# Patient Record
Sex: Female | Born: 1940 | Race: White | Hispanic: No | Marital: Married | Smoking: Never smoker
Health system: Southern US, Community
[De-identification: ages and names within clinical notes are randomized; demographics above are authoritative.]

## PROBLEM LIST (undated history)

## (undated) DIAGNOSIS — I5032 Chronic diastolic (congestive) heart failure: Secondary | ICD-10-CM

## (undated) DIAGNOSIS — I639 Cerebral infarction, unspecified: Secondary | ICD-10-CM

## (undated) DIAGNOSIS — N184 Chronic kidney disease, stage 4 (severe): Secondary | ICD-10-CM

## (undated) DIAGNOSIS — I2510 Atherosclerotic heart disease of native coronary artery without angina pectoris: Secondary | ICD-10-CM

## (undated) DIAGNOSIS — Z87442 Personal history of urinary calculi: Secondary | ICD-10-CM

## (undated) DIAGNOSIS — M1990 Unspecified osteoarthritis, unspecified site: Secondary | ICD-10-CM

## (undated) DIAGNOSIS — I071 Rheumatic tricuspid insufficiency: Secondary | ICD-10-CM

## (undated) DIAGNOSIS — I052 Rheumatic mitral stenosis with insufficiency: Secondary | ICD-10-CM

## (undated) DIAGNOSIS — E039 Hypothyroidism, unspecified: Secondary | ICD-10-CM

## (undated) DIAGNOSIS — I480 Paroxysmal atrial fibrillation: Secondary | ICD-10-CM

## (undated) DIAGNOSIS — I10 Essential (primary) hypertension: Secondary | ICD-10-CM

## (undated) HISTORY — PX: TONSILLECTOMY: SUR1361

## (undated) HISTORY — DX: Rheumatic mitral stenosis with insufficiency: I05.2

## (undated) HISTORY — PX: ABDOMINAL HYSTERECTOMY: SHX81

## (undated) HISTORY — DX: Atherosclerotic heart disease of native coronary artery without angina pectoris: I25.10

## (undated) HISTORY — PX: CARDIAC CATHETERIZATION: SHX172

## (undated) HISTORY — DX: Essential (primary) hypertension: I10

## (undated) HISTORY — PX: PARTIAL HYSTERECTOMY: SHX80

## (undated) HISTORY — DX: Rheumatic tricuspid insufficiency: I07.1

---

## 2004-10-05 ENCOUNTER — Ambulatory Visit: Payer: Self-pay | Admitting: Family Medicine

## 2005-01-13 ENCOUNTER — Ambulatory Visit: Payer: Self-pay | Admitting: Internal Medicine

## 2006-05-24 ENCOUNTER — Ambulatory Visit: Payer: Self-pay | Admitting: Surgery

## 2006-11-08 ENCOUNTER — Ambulatory Visit: Payer: Self-pay | Admitting: Gastroenterology

## 2008-12-26 HISTORY — PX: COLONOSCOPY: SHX174

## 2014-02-17 ENCOUNTER — Encounter: Payer: Self-pay | Admitting: Cardiovascular Disease

## 2014-02-17 ENCOUNTER — Encounter (INDEPENDENT_AMBULATORY_CARE_PROVIDER_SITE_OTHER): Payer: Self-pay

## 2014-02-17 ENCOUNTER — Ambulatory Visit (INDEPENDENT_AMBULATORY_CARE_PROVIDER_SITE_OTHER): Payer: Medicare Other | Admitting: Cardiovascular Disease

## 2014-02-17 VITALS — BP 170/70 | HR 75 | Ht 62.0 in | Wt 164.8 lb

## 2014-02-17 DIAGNOSIS — I6529 Occlusion and stenosis of unspecified carotid artery: Secondary | ICD-10-CM

## 2014-02-17 DIAGNOSIS — I10 Essential (primary) hypertension: Secondary | ICD-10-CM

## 2014-02-17 DIAGNOSIS — I2510 Atherosclerotic heart disease of native coronary artery without angina pectoris: Secondary | ICD-10-CM

## 2014-02-17 DIAGNOSIS — E782 Mixed hyperlipidemia: Secondary | ICD-10-CM | POA: Insufficient documentation

## 2014-02-17 DIAGNOSIS — R931 Abnormal findings on diagnostic imaging of heart and coronary circulation: Secondary | ICD-10-CM | POA: Insufficient documentation

## 2014-02-17 DIAGNOSIS — E785 Hyperlipidemia, unspecified: Secondary | ICD-10-CM

## 2014-02-17 DIAGNOSIS — R9389 Abnormal findings on diagnostic imaging of other specified body structures: Secondary | ICD-10-CM

## 2014-02-17 NOTE — Assessment & Plan Note (Signed)
20-30% disease of the mid left circumflex by catheterization in September 2005. Recommended aggressive cholesterol management

## 2014-02-17 NOTE — Assessment & Plan Note (Signed)
Study suggests normal LV function, mild TR, mild to moderate PR I'm not particularly concerned with the mild to moderate PR if this is a true finding. She is relatively asymptomatic. No changes to her medications. No repeat echocardiogram is typically needed

## 2014-02-17 NOTE — Assessment & Plan Note (Signed)
Studies since 2005 have documented moderate left carotid disease that has been relatively stable. Most recent CT scan suggesting 50-60%  On the left. This can be watched with periodic ultrasounds. Recommended aggressive cholesterol management with goal LDL less than 70

## 2014-02-17 NOTE — Assessment & Plan Note (Signed)
Given her carotid arterial disease, need to treat her cholesterol aggressively. Goal LDL less than 70

## 2014-02-17 NOTE — Assessment & Plan Note (Signed)
Blood pressure is very elevated today. Uncertain if this is secondary to being in our office. She does report much improved blood pressures at home typically in the 120-130 range. We have suggested she closely monitor her blood pressures at home, write these down and call our office with numbers in the next week or so. If they continue to run high, they would need to be aggressively treated.

## 2014-02-17 NOTE — Patient Instructions (Signed)
Please closely monitor your blood pressure Call the office with numbers in the next week or so  We will review your labs when available as well as other records  Call if you would like an ultrasound of you carotid next year Consider requesting a CD of the neck CT scan  Please call us if you have new issues that need to be addressed before your next appt.  Your physician wants you to follow-up in: 12 months.  You will receive a reminder letter in the mail two months in advance. If you don't receive a letter, please call our office to schedule the follow-up appointment.

## 2014-02-17 NOTE — Progress Notes (Signed)
Patient ID: Caroline [name], female    DOB: 1941/12/09, 73 y.o.   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  She reports that she recently had a carotid CT scan done.  This was done in followup for bruit and known stenosis.  He reports was done locally but referred to [facility] for the reading. The details bilateral carotid calcification, 50-60% stenosis of the proximal left internal carotid. There is focal high-grade stenosis of the left external carotid.  no significant stenoses on the right   Results seem to be similar to prior CT scans dating back to 2005 . There was a discrepancy with MRI in 2007 that did not show any stenosis There is a "kink " possibly bilaterally which has been a chronic finding   Prior echocardiogram January 2014 showed normal LV function with ejection fraction 69%, mild TR The report does mention at least mild to moderate regurg, mild TR  Cardiac catheterization September 2005 showing 20-30% mid left circumflex disease, ejection fraction 60%, otherwise normal study  In general she feels well with no complaints. She denies any significant chest pain, shortness of breath. She is somewhat concerned about her carotid artery and would like a second opinion.  EKG shows normal sinus rhythm with rate 75 beats per minute, no significant ST or T wave changes     Outpatient Encounter Prescriptions as of 02/17/2014  Medication Sig  . amLODipine (NORVASC) 10 MG tablet Take 10 mg by mouth daily.  . cholecalciferol (VITAMIN D) 1000 UNITS tablet Take 1,000 Units by mouth daily.  Marland Kitchen levothyroxine (SYNTHROID, LEVOTHROID) 100 MCG tablet Take 100 mcg by mouth daily before breakfast.  .  lisinopril-hydrochlorothiazide (PRINZIDE,ZESTORETIC) 20-25 MG per tablet Take 1 tablet by mouth daily.  . Multiple Vitamin (MULTIVITAMIN) tablet Take 1 tablet by mouth daily.  . rosuvastatin (CRESTOR) 10 MG tablet Take 10 mg by mouth daily.    Review of Systems  Constitutional: Negative.   HENT: Negative.   Eyes: Negative.   Respiratory: Negative.   Cardiovascular: Negative.   Gastrointestinal: Negative.   Endocrine: Negative.   Musculoskeletal: Negative.   Skin: Negative.   Allergic/Immunologic: Negative.   Neurological: Negative.   Hematological: Negative.   Psychiatric/Behavioral: Negative.   All other systems reviewed and are negative.    BP 170/70  Pulse 75  Ht 5\' 2"  (1.575 m)  Wt 164 lb 12 oz (74.73 kg)  BMI 30.13 kg/m2 Repeat blood pressure by myself showed systolic pressure [id number] Physical Exam  Nursing note and vitals reviewed. Constitutional: She is oriented to person, place, and time. She appears well-developed and well-nourished.  HENT:  Head: Normocephalic.  Nose: Nose normal.  Mouth/Throat: Oropharynx is clear and moist.  Eyes: Conjunctivae are normal. Pupils are equal, round, and reactive to light.  Neck: Normal range of motion. Neck supple. No JVD present.  Cardiovascular: Normal rate, regular rhythm, S1 normal, S2 normal, normal heart sounds and intact distal pulses.  Exam reveals no gallop and no friction rub.   No murmur heard. Pulmonary/Chest: Effort normal and breath sounds normal. No respiratory distress. She has no wheezes. She has no rales. She exhibits no tenderness.  Abdominal: Soft. Bowel sounds are normal. She exhibits no distension. There is no tenderness.  Musculoskeletal: Normal range of motion. She exhibits no edema and no tenderness.  Lymphadenopathy:    She has no cervical adenopathy.  Neurological: She is alert and oriented to person, place, and time. Coordination normal.  Skin: Skin is warm and dry. No rash noted. No erythema.   Psychiatric: She has a normal mood and affect. Her behavior is normal. Judgment and thought content normal.    Assessment and Plan

## 2014-09-09 ENCOUNTER — Ambulatory Visit: Payer: Self-pay

## 2015-03-25 ENCOUNTER — Telehealth: Payer: Self-pay | Admitting: *Deleted

## 2015-03-25 NOTE — Telephone Encounter (Signed)
Pt is overdue for her 1 year appt.  Can we see if she wants to come in to discuss?

## 2015-03-25 NOTE — Telephone Encounter (Signed)
Pt is asking if we can recommend a vascular surgeon  recently had a sonogram  65 percent blockage and now needs to see a vascular surgeon.  Dr dew is who she is about to see but if we know anyone else then please advise patient.

## 2015-03-26 NOTE — Telephone Encounter (Signed)
Pt is coming 04/16/15: at 3:15pm

## 2015-04-14 ENCOUNTER — Telehealth: Payer: Self-pay | Admitting: *Deleted

## 2015-04-14 NOTE — Telephone Encounter (Signed)
Pt states have a blockage 75% in Carotid arteries, would like [place] to refer her to a good vascular surgeon.  Please call patient.  Would prefer to go to [facility]

## 2015-04-16 ENCOUNTER — Ambulatory Visit: Payer: Self-pay | Admitting: Cardiovascular Disease

## 2015-04-20 ENCOUNTER — Ambulatory Visit
Admit: 2015-04-20 | Disposition: A | Discharge status: Home / Self Care | Payer: Self-pay | Attending: Vascular Surgery | Admitting: Vascular Surgery

## 2015-05-08 ENCOUNTER — Ambulatory Visit: Payer: Self-pay | Admitting: Cardiovascular Disease

## 2016-09-07 DIAGNOSIS — M25569 Pain in unspecified knee: Secondary | ICD-10-CM | POA: Insufficient documentation

## 2016-09-25 DIAGNOSIS — E039 Hypothyroidism, unspecified: Secondary | ICD-10-CM | POA: Insufficient documentation

## 2016-09-25 DIAGNOSIS — I10 Essential (primary) hypertension: Secondary | ICD-10-CM | POA: Insufficient documentation

## 2016-10-18 ENCOUNTER — Ambulatory Visit (INDEPENDENT_AMBULATORY_CARE_PROVIDER_SITE_OTHER): Payer: Medicare Other | Admitting: Vascular Surgery

## 2016-10-18 ENCOUNTER — Encounter (INDEPENDENT_AMBULATORY_CARE_PROVIDER_SITE_OTHER): Payer: Self-pay | Admitting: Vascular Surgery

## 2016-10-18 VITALS — BP 184/78 | HR 84 | Resp 16 | Ht 62.0 in | Wt 165.0 lb

## 2016-10-18 DIAGNOSIS — I2510 Atherosclerotic heart disease of native coronary artery without angina pectoris: Secondary | ICD-10-CM | POA: Diagnosis not present

## 2016-10-18 DIAGNOSIS — I6523 Occlusion and stenosis of bilateral carotid arteries: Secondary | ICD-10-CM

## 2016-10-18 DIAGNOSIS — E785 Hyperlipidemia, unspecified: Secondary | ICD-10-CM

## 2016-10-18 DIAGNOSIS — G451 Carotid artery syndrome (hemispheric): Secondary | ICD-10-CM

## 2016-10-18 DIAGNOSIS — I10 Essential (primary) hypertension: Secondary | ICD-10-CM | POA: Diagnosis not present

## 2016-10-18 DIAGNOSIS — G459 Transient cerebral ischemic attack, unspecified: Secondary | ICD-10-CM | POA: Insufficient documentation

## 2016-10-18 MED ORDER — ASPIRIN EC 81 MG PO TBEC: 81.0000 mg | DELAYED_RELEASE_TABLET | Freq: Every day | ORAL | 2 refills | Status: DC

## 2016-10-18 NOTE — Assessment & Plan Note (Signed)
Her carotid disease has not been checked in about a year. At last check, she had moderate disease by duplex but it did not appear as severe on CT angiogram. Have recommended she begin aspirin therapy today. We will get a carotid duplex in the near future at her convenience. She will return to discuss the results and determine further treatment options.

## 2016-10-18 NOTE — Assessment & Plan Note (Signed)
The patient describes symptoms sound clearly like a transient ischemic attack. She was seen at [facility] in her head CT was normal. We are going to perform a carotid evaluation. She will start aspirin. Return to clinic after her duplex.

## 2016-10-18 NOTE — Assessment & Plan Note (Signed)
Follows with cardiology for this

## 2016-10-18 NOTE — Assessment & Plan Note (Signed)
blood pressure control important in reducing the progression of atherosclerotic disease. On appropriate oral medications.  

## 2016-10-18 NOTE — Assessment & Plan Note (Signed)
No longer on Crestor. Reassess when she returns with her carotid duplex

## 2016-10-18 NOTE — Progress Notes (Signed)
MRN : [id number]  Caroline [name] is a 75 y.o. (September 03, 1941) female who presents with chief complaint of  Chief Complaint  Patient presents with  . Carotid    Would like carotid checked, because of a recent TIA in September  .  History of Present Illness: Patient presents for evaluation for carotid artery stenosis. She had been seen but been well over a year since her last visit. At last check, she was found to have mild to moderate carotid artery stenosis that did not require any intervention. Last month, she had an episode of what sounds like a clear TIA. She had about 15 minutes of visual loss in her left eye. She also had associated aphasia and weakness. This resolved in less than a half hour and she was seen and had a normal head CT. She has not had her carotids checked in a year or so. She had not had any previous episodes such as this. She was no longer taking aspirin. She denies fevers or chills. She denies chest pain or shortness of breath. When this happened, she was riding in the car with no clear inciting event or causative factor. It got better without any intervention.  Current Outpatient Prescriptions  Medication Sig Dispense Refill  . allopurinol (ZYLOPRIM) 100 MG tablet     . amLODipine (NORVASC) 10 MG tablet Take 10 mg by mouth daily.    . cholecalciferol (VITAMIN D) 1000 UNITS tablet Take 1,000 Units by mouth daily.    . furosemide (LASIX) 40 MG tablet     . levothyroxine (SYNTHROID, LEVOTHROID) 100 MCG tablet Take 100 mcg by mouth daily before breakfast.    . lisinopril-hydrochlorothiazide (PRINZIDE,ZESTORETIC) 20-25 MG per tablet Take 1 tablet by mouth daily.    . Multiple Vitamin (MULTIVITAMIN) tablet Take 1 tablet by mouth daily.     No current facility-administered medications for this visit.     Past Medical History:  Diagnosis Date  . Hypertension   . Thyroid disease     Past Surgical History:  Procedure Laterality Date  . CARDIAC CATHETERIZATION     Negative   . PARTIAL HYSTERECTOMY      Social History Social History  Substance Use Topics  . Smoking status: Never Smoker  . Smokeless tobacco: Not on file  . Alcohol use 0.6 oz/week    1 Glasses of wine per week     Comment: social   Married, lives with husband  Family History Family History  Problem Relation Age of Onset  . Heart attack Father 66  . Hypertension Father   . Hyperlipidemia Father   . Heart disease Brother     valvular disease   No bleeding or clotting disorders  No Known Allergies   REVIEW OF SYSTEMS (Negative unless checked)  Constitutional: [] Weight loss  [] Fever  [] Chills Cardiac: [] Chest pain   [] Chest pressure   [x] Palpitations   [] Shortness of breath when laying flat   [] Shortness of breath at rest   [] Shortness of breath with exertion. Vascular:  [] Pain in legs with walking   [] Pain in legs at rest   [] Pain in legs when laying flat   [] Claudication   [] Pain in feet when walking  [] Pain in feet at rest  [] Pain in feet when laying flat   [] History of DVT   [] Phlebitis   [] Swelling in legs   [] Varicose veins   [] Non-healing ulcers Pulmonary:   [] Uses home oxygen   [] Productive cough   [] Hemoptysis   [] Wheeze  []   COPD   [] Asthma Neurologic:  [] Dizziness  [] Blackouts   [] Seizures   [] History of stroke   [x] History of TIA  [] Aphasia   [] Temporary blindness   [] Dysphagia   [] Weakness or numbness in arms   [] Weakness or numbness in legs Musculoskeletal:  [] Arthritis   [] Joint swelling   [] Joint pain   [] Low back pain Hematologic:  [] Easy bruising  [] Easy bleeding   [] Hypercoagulable state   [] Anemic  [] Hepatitis Gastrointestinal:  [] Blood in stool   [] Vomiting blood  [] Gastroesophageal reflux/heartburn   [] Difficulty swallowing. Genitourinary:  [] Chronic kidney disease   [] Difficult urination  [] Frequent urination  [] Burning with urination   [] Blood in urine Skin:  [] Rashes   [] Ulcers   [] Wounds Psychological:  [] History of anxiety   []  History of major  depression.  Physical Examination  Vitals:   10/18/16 1557  BP: (!) 184/78  Pulse: 84  Resp: 16  Weight: 165 lb (74.8 kg)  Height: 5\' 2"  (1.575 m)   Body mass index is 30.18 kg/m. Gen:  WD/WN, NAD. Appears younger than stated age Head: [place]/AT, No temporalis wasting. Ear/Nose/Throat: Hearing grossly intact, nares w/o erythema or drainage, trachea midline Eyes: Conjunctiva clear. Sclera non-icteric Neck: Supple.  No JVD. Bilateral carotid bruits present Pulmonary:  Good air movement, equal and clear to auscultation bilaterally.  Cardiac: RRR, normal S1, S2, no Murmurs, rubs or gallops. Vascular:  Vessel Right Left  Radial Palpable Palpable  Ulnar Palpable Palpable  Brachial Palpable Palpable  Carotid Palpable, with bruit Palpable, with bruit  Aorta Not palpable N/A  Femoral Palpable Palpable  Popliteal Palpable Palpable  PT Palpable Palpable  DP Palpable Not Palpable   Gastrointestinal: soft, non-tender/non-distended. No guarding/reflex.  Musculoskeletal: M/S 5/5 throughout.  No deformity or atrophy. No lower extremity edema. Neurologic: CN 2-12 intact. Sensation grossly intact in extremities.  Symmetrical.  Speech is fluent. Motor exam as listed above. Psychiatric: Judgment intact, Mood & affect appropriate for pt's clinical situation. Dermatologic: No rashes or ulcers noted.  No cellulitis or open wounds. Lymph : No Cervical, Axillary, or Inguinal lymphadenopathy.     CBC No results found for: WBC, HGB, HCT, MCV, PLT  BMET No results found for: NA, K, CL, CO2, GLUCOSE, BUN, CREATININE, CALCIUM, GFRNONAA, GFRAA CrCl cannot be calculated (No order found.).  COAG No results found for: INR, PROTIME  Radiology No results found.   Assessment/Plan Hyperlipidemia No longer on Crestor. Reassess when she returns with her carotid duplex  HTN (hypertension) blood pressure control important in reducing the progression of atherosclerotic disease. On appropriate oral  medications.   Coronary disease Follows with cardiology for this  Carotid stenosis Her carotid disease has not been checked in about a year. At last check, she had moderate disease by duplex but it did not appear as severe on CT angiogram. Have recommended she begin aspirin therapy today. We will get a carotid duplex in the near future at her convenience. She will return to discuss the results and determine further treatment options.  TIA (transient ischemic attack) The patient describes symptoms sound clearly like a transient ischemic attack. She was seen at [facility] in her head CT was normal. We are going to perform a carotid evaluation. She will start aspirin. Return to clinic after her duplex.    Leotis Pain, MD  10/18/2016 4:30 PM    This note was created with Dragon medical transcription system.  Any errors from dictation are purely unintentional

## 2016-10-18 NOTE — Patient Instructions (Signed)
°Carotid Artery Disease °The carotid arteries are the two main arteries on either side of the neck that supply blood to the brain. Carotid artery disease, also called carotid artery stenosis, is the narrowing or blockage of one or both carotid arteries. Carotid artery disease increases your risk for a stroke or a transient ischemic attack (TIA). A TIA is an episode in which a waxy, fatty substance that accumulates within the artery (plaque) blocks blood flow to the brain. A TIA is considered a "warning stroke."  °CAUSES  °· Buildup of plaque inside the carotid arteries (atherosclerosis) (common). °· A weakened outpouching in an artery (aneurysm). °· Inflammation of the carotid artery (arteritis). °· A fibrous growth within the carotid artery (fibromuscular dysplasia). °· Tissue death within the carotid artery due to radiation treatment (post-radiation necrosis). °· Decreased blood flow due to spasms of the carotid artery (vasospasm). °· Separation of the walls of the carotid artery (carotid dissection). °RISK FACTORS °· High cholesterol (dyslipidemia).   °· High blood pressure (hypertension).   °· Smoking.   °· Obesity.   °· Diabetes.   °· Family history of cardiovascular disease.   °· Inactivity or lack of regular exercise.   °· Being female. Men have an increased risk of developing atherosclerosis earlier in life than women.   °SYMPTOMS  °Carotid artery disease does not cause symptoms. °DIAGNOSIS °Diagnosis of carotid artery disease may include:  °· A physical exam. Your health care provider may hear an abnormal sound (bruit) when listening to the carotid arteries.   °· Specific tests that look at the blood flow in the carotid arteries. These tests include:   °¨ Carotid artery ultrasonography.   °¨ Carotid or cerebral angiography.   °¨ Computerized tomographic angiography (CTA).   °¨ Magnetic resonance angiography (MRA).   °TREATMENT  °Treatment of carotid artery disease can include a combination of treatments.  Treatment options include: °· Surgery. You may have:   °¨ A carotid endarterectomy. This is a surgery to remove the blockages in the carotid arteries.   °¨ A carotid angioplasty with stenting. This is a nonsurgical interventional procedure. A wire mesh (stent) is used to widen the blocked carotid arteries.   °· Medicines to control blood pressure, cholesterol, and reduce blood clotting (antiplatelet therapy).   °· Adjusting your diet.   °· Lifestyle changes such as:   °¨ Quitting smoking.   °¨ Exercising as tolerated or as directed by your health care provider.   °¨ Controlling and maintaining a good blood pressure.   °¨ Keeping cholesterol levels under control.   °HOME CARE INSTRUCTIONS  °· Take medicines only as directed by your health care provider. Make sure you understand all your medicine instructions. Do not stop your medicines without talking to your health care provider.   °· Follow your health care provider's diet instructions. It is important to eat a healthy diet that is low in saturated fats and includes plenty of fresh fruits, vegetables, and lean meats. High-fat, high-sodium foods as well as foods that are fried, overly processed, or have poor nutritional value should be avoided. °· Maintain a healthy weight.   °· Stay physically active. It is recommended that you get at least 30 minutes of activity every day.   °· Do not use any tobacco products including cigarettes, chewing tobacco, or electronic cigarettes. If you need help quitting, ask your health care provider. °· Limit alcohol use to:   °¨ No more than 2 drinks per day for men.   °¨ No more than 1 drink per day for nonpregnant women.   °· Do not use illegal drugs.   °· Keep all follow-up visits as directed by your health   care provider.   °SEEK IMMEDIATE MEDICAL CARE IF:  °You develop TIA or stroke symptoms. These include:  °· Sudden weakness or numbness on one side of the body, such as in the face, arm, or leg.   °· Sudden confusion.    °· Trouble speaking (aphasia) or understanding.   °· Sudden trouble seeing out of one or both eyes.   °· Sudden trouble walking.   °· Dizziness or feeling like you might faint.   °· Loss of balance or coordination.   °· Sudden severe headache with no known cause.   °· Sudden trouble swallowing (dysphagia).   °If you have any of these symptoms, call your local emergency services (911 in [place] Do not drive yourself to the clinic or hospital. This is a medical emergency.  °  °This information is not intended to replace advice given to you by your health care provider. Make sure you discuss any questions you have with your health care provider. °  °Document Released: 03/05/2012 Document Revised: 01/02/2015 Document Reviewed: 06/12/2013 °[employer] Interactive Patient Education ©2016 [employer] ° °

## 2016-11-02 ENCOUNTER — Ambulatory Visit (INDEPENDENT_AMBULATORY_CARE_PROVIDER_SITE_OTHER): Payer: Medicare Other

## 2016-11-02 DIAGNOSIS — I6523 Occlusion and stenosis of bilateral carotid arteries: Secondary | ICD-10-CM

## 2016-11-04 ENCOUNTER — Ambulatory Visit (INDEPENDENT_AMBULATORY_CARE_PROVIDER_SITE_OTHER): Payer: Medicare Other | Admitting: Vascular Surgery

## 2016-11-07 ENCOUNTER — Ambulatory Visit (INDEPENDENT_AMBULATORY_CARE_PROVIDER_SITE_OTHER): Payer: Medicare Other | Admitting: Vascular Surgery

## 2016-11-07 ENCOUNTER — Encounter (INDEPENDENT_AMBULATORY_CARE_PROVIDER_SITE_OTHER): Payer: Self-pay | Admitting: Vascular Surgery

## 2016-11-07 VITALS — BP 110/64 | HR 102 | Resp 16 | Ht 62.0 in | Wt 167.4 lb

## 2016-11-07 DIAGNOSIS — E785 Hyperlipidemia, unspecified: Secondary | ICD-10-CM | POA: Diagnosis not present

## 2016-11-07 DIAGNOSIS — I6523 Occlusion and stenosis of bilateral carotid arteries: Secondary | ICD-10-CM | POA: Diagnosis not present

## 2016-11-07 DIAGNOSIS — G451 Carotid artery syndrome (hemispheric): Secondary | ICD-10-CM

## 2016-11-07 NOTE — Progress Notes (Signed)
Subjective:    Patient ID: Caroline [name], female    DOB: [birth year], 75 y.o.   MRN: [id number] Chief Complaint  Patient presents with  . Follow-up   Patient presents to review vascular studies. She was last seen on 10/18/16. The patient underwent a bilateral carotid duplex scan which was notable for a duplex with 40-59% ICA stenosis bilaterally. The patient denies experiencing Amaurosis Fugax, TIA like symptoms or focal motor deficits.  The patient continues a regimen of ASA medication.  The patient remains abstinent of tobacco use.  The patient denies any new health changes since last visit. Medication list reviewed.     Review of Systems  Constitutional: Negative.   HENT: Negative.   Eyes: Negative.   Respiratory: Negative.   Cardiovascular: Negative.   Gastrointestinal: Negative.   Endocrine: Negative.   Genitourinary: Negative.   Musculoskeletal: Negative.   Skin: Negative.   Allergic/Immunologic: Negative.   Neurological: Negative.   Hematological: Negative.   Psychiatric/Behavioral: Negative.        Objective:   Physical Exam  Constitutional: She is oriented to person, place, and time. She appears well-developed and well-nourished.  HENT:  Head: Normocephalic and atraumatic.  Eyes: Conjunctivae and EOM are normal.  Neck: Normal range of motion.  Cardiovascular: Normal rate, regular rhythm, normal heart sounds and intact distal pulses.   Pulses:      Radial pulses are 2+ on the right side, and 2+ on the left side.       Dorsalis pedis pulses are 2+ on the right side, and 2+ on the left side.       Posterior tibial pulses are 2+ on the right side, and 2+ on the left side.  Carotids: Bilateral Bruits Noted.   Pulmonary/Chest: Effort normal and breath sounds normal.  Abdominal: Soft. Bowel sounds are normal.  Musculoskeletal: Normal range of motion. She exhibits no edema.  Neurological: She is alert and oriented to person, place, and time.  Skin: Skin is warm  and dry.  Psychiatric: She has a normal mood and affect. Her behavior is normal. Judgment and thought content normal.   BP 110/64   Pulse (!) 102   Resp 16   Ht 5\' 2"  (1.575 m)   Wt 167 lb 6.4 oz (75.9 kg)   BMI 30.62 kg/m   Past Medical History:  Diagnosis Date  . Hypertension   . Thyroid disease    Social History   Social History  . Marital status: Married    Spouse name: N/A  . Number of children: N/A  . Years of education: N/A   Occupational History  . Not on file.   Social History Main Topics  . Smoking status: Never Smoker  . Smokeless tobacco: Not on file  . Alcohol use 0.6 oz/week    1 Glasses of wine per week     Comment: social   . Drug use: No  . Sexual activity: Not on file   Other Topics Concern  . Not on file   Social History Narrative  . No narrative on file   Past Surgical History:  Procedure Laterality Date  . CARDIAC CATHETERIZATION     Negative   . PARTIAL HYSTERECTOMY     Family History  Problem Relation Age of Onset  . Heart attack Father 62  . Hypertension Father   . Hyperlipidemia Father   . Heart disease Brother     valvular disease    No Known Allergies  Assessment & Plan:  Patient presents to review vascular studies. She was last seen on 10/18/16. The patient underwent a bilateral carotid duplex scan which was notable for a duplex with 40-59% ICA stenosis bilaterally. The patient denies experiencing Amaurosis Fugax, TIA like symptoms or focal motor deficits.  The patient continues a regimen of ASA medication.  The patient remains abstinent of tobacco use.  The patient denies any new health changes since last visit. Medication list reviewed.   1. Bilateral carotid artery stenosis Studies reviewed with patient. Patient asymptomatic with stable duplex.  No intervention at this time.  Patient to return in one year for surveillance carotid duplex. Patient to continue medical optimization with ASA  Patient to remain  abstinent of tobacco use. I have discussed with the patient at length the risk factors for and pathogenesis of atherosclerotic disease and encouraged a healthy diet, regular exercise regimen and blood pressure / glucose control.  Patient was instructed to contact our office in the interim with problems such as arm / leg weakness or numbness, speech / swallowing difficulty or temporary monocular blindness. The patient expresses their understanding.   2. TIA - Stable Asymptomatic. No recent attacks.   3. Hyperlipidemia, unspecified hyperlipidemia type - Stable On ASA for medical optimization. I have discussed with the patient the addition of a statin and its role in the medical optimization of atherosclerotic disease. The patient expresses their understanding and will discuss the addition of lipid lowering agent with their internist.   Current Outpatient Prescriptions on File Prior to Visit  Medication Sig Dispense Refill  . allopurinol (ZYLOPRIM) 100 MG tablet     . amLODipine (NORVASC) 10 MG tablet Take 10 mg by mouth daily.    Marland Kitchen aspirin EC 81 MG tablet Take 1 tablet (81 mg total) by mouth daily. 150 tablet 2  . cholecalciferol (VITAMIN D) 1000 UNITS tablet Take 1,000 Units by mouth daily.    . furosemide (LASIX) 40 MG tablet     . levothyroxine (SYNTHROID, LEVOTHROID) 100 MCG tablet Take 100 mcg by mouth daily before breakfast.    . lisinopril-hydrochlorothiazide (PRINZIDE,ZESTORETIC) 20-25 MG per tablet Take 1 tablet by mouth daily.    . Multiple Vitamin (MULTIVITAMIN) tablet Take 1 tablet by mouth daily.     No current facility-administered medications on file prior to visit.     There are no Patient Instructions on file for this visit. No Follow-up on file.   Geselle Cardosa A Iyanla Eilers, PA-C

## 2017-01-26 DIAGNOSIS — Z87442 Personal history of urinary calculi: Secondary | ICD-10-CM

## 2017-01-26 HISTORY — DX: Personal history of urinary calculi: Z87.442

## 2017-04-21 DIAGNOSIS — M1710 Unilateral primary osteoarthritis, unspecified knee: Secondary | ICD-10-CM | POA: Insufficient documentation

## 2017-04-21 DIAGNOSIS — M179 Osteoarthritis of knee, unspecified: Secondary | ICD-10-CM | POA: Insufficient documentation

## 2017-05-25 ENCOUNTER — Encounter: Payer: Self-pay | Admitting: *Deleted

## 2017-05-31 ENCOUNTER — Ambulatory Visit (INDEPENDENT_AMBULATORY_CARE_PROVIDER_SITE_OTHER): Payer: Medicare Other | Admitting: General Surgery

## 2017-05-31 ENCOUNTER — Encounter: Payer: Self-pay | Admitting: General Surgery

## 2017-05-31 VITALS — BP 164/84 | HR 68 | Resp 12 | Ht 62.0 in | Wt 155.0 lb

## 2017-05-31 DIAGNOSIS — K8020 Calculus of gallbladder without cholecystitis without obstruction: Secondary | ICD-10-CM | POA: Diagnosis not present

## 2017-05-31 NOTE — Patient Instructions (Addendum)

## 2017-05-31 NOTE — Progress Notes (Signed)
Patient ID: Caroline [name], female   DOB: [birth year], 76 y.o.   MRN: [id number]  Chief Complaint  Patient presents with  . Other    gall stones    HPI Caroline [name] is a 76 y.o. female here today for evaluation of gallstones. Patient states she is having a little abdominal pain right upper quadrant that travels to her back at times. Experienced similar episode approx 25 years ago that went away and did not recur till now. She states the worst part is the nausea. Moves her bowels every three days. Ultrasound done 05/09/2017.   HPI  Past Medical History:  Diagnosis Date  . Hypertension   . Thyroid disease     Past Surgical History:  Procedure Laterality Date  . CARDIAC CATHETERIZATION     Negative   . COLONOSCOPY  2010  . PARTIAL HYSTERECTOMY      Family History  Problem Relation Age of Onset  . Heart attack Father 66  . Hypertension Father   . Hyperlipidemia Father   . Heart disease Brother        valvular disease     Social History Social History  Substance Use Topics  . Smoking status: Never Smoker  . Smokeless tobacco: Never Used  . Alcohol use 0.6 oz/week    1 Glasses of wine per week     Comment: social     No Known Allergies  Current Outpatient Prescriptions  Medication Sig Dispense Refill  . allopurinol (ZYLOPRIM) 100 MG tablet     . amLODipine (NORVASC) 10 MG tablet Take 10 mg by mouth daily.    . cholecalciferol (VITAMIN D) 1000 UNITS tablet Take 1,000 Units by mouth daily.    . furosemide (LASIX) 40 MG tablet     . levothyroxine (SYNTHROID, LEVOTHROID) 100 MCG tablet Take 100 mcg by mouth daily before breakfast.    . lisinopril-hydrochlorothiazide (PRINZIDE,ZESTORETIC) 20-25 MG per tablet Take 1 tablet by mouth daily.     No current facility-administered medications for this visit.     Review of Systems Review of Systems  Constitutional: Negative.   Respiratory: Negative.   Cardiovascular: Negative.   Gastrointestinal: Positive for abdominal  pain, constipation and nausea.    Blood pressure (!) 164/84, pulse 68, resp. rate 12, height 5\' 2"  (1.575 m), weight 155 lb (70.3 kg).  Physical Exam Physical Exam  Constitutional: She is oriented to person, place, and time. She appears well-developed and well-nourished.  Eyes: Conjunctivae are normal. No scleral icterus.    Neck: Neck supple.  Cardiovascular: Normal rate, regular rhythm and normal heart sounds.   Pulmonary/Chest: Effort normal and breath sounds normal.  Abdominal: Soft. Normal appearance and bowel sounds are normal. There is no hepatomegaly.  Lymphadenopathy:    She has no cervical adenopathy.  Neurological: She is alert and oriented to person, place, and time.  Skin: Skin is warm and dry.  Psychiatric: She has a normal mood and affect. Her behavior is normal.    Data Reviewed Notes and images reviewed.  Assessment    Cholelithiasis- likely causing abdominal pain and nausea. Recommend lap cholecystectomy and intra-op cholangiogram. Check CBC and CMP today. History of hypothyroidism, HTN, B/l carotid artery stenosis, HLD, and TIA- managed by PCP Stable exam otherwise.    Plan    Options and risks of lap chole reviewed with patient, patient agreeable to plan. Can try Zantac or pepcid for nausea relief. Advised to call with any questions or concerns.     Laparoscopic  Cholecystectomy with Intraoperative Cholangiogram. The procedure, including it's potential risks and complications (including but not limited to infection, bleeding, injury to intra-abdominal organs or bile ducts, bile leak, poor cosmetic result, sepsis and death) were discussed with the patient in detail. Non-operative options, including their inherent risks (acute calculous cholecystitis with possible choledocholithiasis or gallstone pancreatitis, with the risk of ascending cholangitis, sepsis, and death) were discussed as well. The patient expressed and understanding of what we discussed and wishes  to proceed with laparoscopic cholecystectomy. The patient further understands that if it is technically not possible, or it is unsafe to proceed laparoscopically, that I will convert to an open cholecystectomy.  HPI, Physical Exam, Assessment and Plan have been scribed under the direction and in the presence of Mckinley Jewel, MD  Gaspar Cola, CMA  The patient is scheduled for surgery at [facility] on [facility]/13/17. She will pre admit by phone. The patient is aware of date and instructions.  Documented by Lesly Rubenstein LPN  I have completed the exam and reviewed the above documentation for accuracy and completeness.  I agree with the above.  Haematologist has been used and any errors in dictation or transcription are unintentional.  Seeplaputhur G. Jamal Collin, M.D., F.A.C.S.   Junie Panning G 06/01/2017, 9:12 AM

## 2017-06-01 ENCOUNTER — Telehealth: Payer: Self-pay | Admitting: *Deleted

## 2017-06-01 LAB — CBC WITH DIFFERENTIAL/PLATELET
BASOS ABS: 0.1 10*3/uL (ref 0.0–0.2)
Basos: 0 %
EOS (ABSOLUTE): 0.1 10*3/uL (ref 0.0–0.4)
EOS: 1 %
HEMATOCRIT: 40.7 % (ref 34.0–46.6)
Hemoglobin: 14.3 g/dL (ref 11.1–15.9)
Immature Grans (Abs): 0 10*3/uL (ref 0.0–0.1)
Immature Granulocytes: 0 %
Lymphocytes Absolute: 4.3 10*3/uL — ABNORMAL HIGH (ref 0.7–3.1)
Lymphs: 24 %
MCH: 33.6 pg — AB (ref 26.6–33.0)
MCHC: 35.1 g/dL (ref 31.5–35.7)
MCV: 96 fL (ref 79–97)
MONOS ABS: 1.6 10*3/uL — AB (ref 0.1–0.9)
Monocytes: 9 %
NEUTROS ABS: 12.1 10*3/uL — AB (ref 1.4–7.0)
Neutrophils: 66 %
Platelets: 348 10*3/uL (ref 150–379)
RBC: 4.26 x10E6/uL (ref 3.77–5.28)
RDW: 14.7 % (ref 12.3–15.4)
WBC: 18.2 10*3/uL — AB (ref 3.4–10.8)

## 2017-06-01 LAB — COMPREHENSIVE METABOLIC PANEL
A/G RATIO: 1.7 (ref 1.2–2.2)
ALK PHOS: 53 IU/L (ref 39–117)
ALT: 21 IU/L (ref 0–32)
AST: 18 IU/L (ref 0–40)
Albumin: 4.5 g/dL (ref 3.5–4.8)
BILIRUBIN TOTAL: 0.4 mg/dL (ref 0.0–1.2)
BUN/Creatinine Ratio: 26 (ref 12–28)
BUN: 50 mg/dL — AB (ref 8–27)
CO2: 27 mmol/L (ref 18–29)
Calcium: 10.4 mg/dL — ABNORMAL HIGH (ref 8.7–10.3)
Chloride: 97 mmol/L (ref 96–106)
Creatinine, Ser: 1.93 mg/dL — ABNORMAL HIGH (ref 0.57–1.00)
GFR, EST AFRICAN AMERICAN: 29 mL/min/{1.73_m2} — AB (ref >59)
GFR, EST NON AFRICAN AMERICAN: 25 mL/min/{1.73_m2} — AB (ref >59)
GLOBULIN, TOTAL: 2.6 g/dL (ref 1.5–4.5)
Glucose: 96 mg/dL (ref 65–99)
Potassium: 4 mmol/L (ref 3.5–5.2)
Sodium: 144 mmol/L (ref 134–144)
Total Protein: 7.1 g/dL (ref 6.0–8.5)

## 2017-06-01 NOTE — Telephone Encounter (Signed)
Notified patient as instructed, abnormal labs. Discussed follow-up appointments with Caroline [name]'s office (labs faxed to them and I called them as well), patient agrees. All aware surgery is for 06-13-17.The patient is aware to call back for any questions or concerns. She is aware that their office will be contacting her.

## 2017-06-05 ENCOUNTER — Inpatient Hospital Stay: Admission: RE | Admit: 2017-06-05 | Payer: Self-pay | Source: Ambulatory Visit

## 2017-06-05 MED ORDER — KETOROLAC TROMETHAMINE 30 MG/ML IJ SOLN
INTRAMUSCULAR | Status: AC
Start: 2017-06-05 — End: 2017-06-05
  Filled 2017-06-05: qty 1

## 2017-06-06 ENCOUNTER — Observation Stay: Payer: Medicare Other

## 2017-06-06 ENCOUNTER — Encounter: Payer: Self-pay | Admitting: *Deleted

## 2017-06-06 ENCOUNTER — Inpatient Hospital Stay
Admission: EM | Admit: 2017-06-06 | Discharge: 2017-06-07 | DRG: 065 | Disposition: A | Discharge status: Home / Self Care | Payer: Medicare Other | Attending: Internal Medicine | Admitting: Internal Medicine

## 2017-06-06 ENCOUNTER — Other Ambulatory Visit: Payer: Self-pay

## 2017-06-06 ENCOUNTER — Observation Stay
Admit: 2017-06-06 | Discharge: 2017-06-06 | Disposition: A | Discharge status: Home / Self Care | Payer: Medicare Other | Attending: Internal Medicine | Admitting: Internal Medicine

## 2017-06-06 ENCOUNTER — Emergency Department: Payer: Medicare Other

## 2017-06-06 DIAGNOSIS — N179 Acute kidney failure, unspecified: Secondary | ICD-10-CM | POA: Diagnosis present

## 2017-06-06 DIAGNOSIS — E785 Hyperlipidemia, unspecified: Secondary | ICD-10-CM | POA: Diagnosis present

## 2017-06-06 DIAGNOSIS — N183 Chronic kidney disease, stage 3 (moderate): Secondary | ICD-10-CM | POA: Diagnosis present

## 2017-06-06 DIAGNOSIS — I639 Cerebral infarction, unspecified: Principal | ICD-10-CM | POA: Diagnosis present

## 2017-06-06 DIAGNOSIS — E039 Hypothyroidism, unspecified: Secondary | ICD-10-CM | POA: Diagnosis present

## 2017-06-06 DIAGNOSIS — G459 Transient cerebral ischemic attack, unspecified: Secondary | ICD-10-CM

## 2017-06-06 DIAGNOSIS — I129 Hypertensive chronic kidney disease with stage 1 through stage 4 chronic kidney disease, or unspecified chronic kidney disease: Secondary | ICD-10-CM | POA: Diagnosis present

## 2017-06-06 DIAGNOSIS — R4701 Aphasia: Secondary | ICD-10-CM | POA: Diagnosis present

## 2017-06-06 DIAGNOSIS — G8191 Hemiplegia, unspecified affecting right dominant side: Secondary | ICD-10-CM | POA: Diagnosis present

## 2017-06-06 DIAGNOSIS — Z7982 Long term (current) use of aspirin: Secondary | ICD-10-CM

## 2017-06-06 DIAGNOSIS — Z79899 Other long term (current) drug therapy: Secondary | ICD-10-CM

## 2017-06-06 DIAGNOSIS — R29700 NIHSS score 0: Secondary | ICD-10-CM | POA: Diagnosis present

## 2017-06-06 DIAGNOSIS — E876 Hypokalemia: Secondary | ICD-10-CM | POA: Diagnosis present

## 2017-06-06 LAB — URINALYSIS, ROUTINE W REFLEX MICROSCOPIC
BILIRUBIN URINE: NEGATIVE
GLUCOSE, UA: NEGATIVE mg/dL
HGB URINE DIPSTICK: NEGATIVE
Ketones, ur: NEGATIVE mg/dL
NITRITE: NEGATIVE
PROTEIN: NEGATIVE mg/dL
RBC / HPF: NONE SEEN RBC/hpf (ref 0–5)
Specific Gravity, Urine: 1.005 (ref 1.005–1.030)
pH: 6 (ref 5.0–8.0)

## 2017-06-06 LAB — COMPREHENSIVE METABOLIC PANEL
ALT: 23 U/L (ref 14–54)
AST: 24 U/L (ref 15–41)
Albumin: 4.4 g/dL (ref 3.5–5.0)
Alkaline Phosphatase: 44 U/L (ref 38–126)
Anion gap: 13 (ref 5–15)
BILIRUBIN TOTAL: 0.7 mg/dL (ref 0.3–1.2)
BUN: 50 mg/dL — AB (ref 6–20)
CHLORIDE: 95 mmol/L — AB (ref 101–111)
CO2: 27 mmol/L (ref 22–32)
Calcium: 10.1 mg/dL (ref 8.9–10.3)
Creatinine, Ser: 2.18 mg/dL — ABNORMAL HIGH (ref 0.44–1.00)
GFR calc Af Amer: 24 mL/min — ABNORMAL LOW (ref >60)
GFR calc non Af Amer: 21 mL/min — ABNORMAL LOW (ref >60)
GLUCOSE: 116 mg/dL — AB (ref 65–99)
POTASSIUM: 2.8 mmol/L — AB (ref 3.5–5.1)
Sodium: 135 mmol/L (ref 135–145)
TOTAL PROTEIN: 7.7 g/dL (ref 6.5–8.1)

## 2017-06-06 LAB — DIFFERENTIAL
BASOS ABS: 0.2 10*3/uL — AB (ref 0–0.1)
Basophils Relative: 1 %
EOS ABS: 0.2 10*3/uL (ref 0–0.7)
Eosinophils Relative: 2 %
LYMPHS ABS: 5.2 10*3/uL — AB (ref 1.0–3.6)
LYMPHS PCT: 32 %
Monocytes Absolute: 1.4 10*3/uL — ABNORMAL HIGH (ref 0.2–0.9)
Monocytes Relative: 9 %
NEUTROS ABS: 9.4 10*3/uL — AB (ref 1.4–6.5)
NEUTROS PCT: 56 %

## 2017-06-06 LAB — CBC
HCT: 40.7 % (ref 35.0–47.0)
HEMOGLOBIN: 13.9 g/dL (ref 12.0–16.0)
MCH: 33 pg (ref 26.0–34.0)
MCHC: 34.2 g/dL (ref 32.0–36.0)
MCV: 96.5 fL (ref 80.0–100.0)
Platelets: 316 10*3/uL (ref 150–440)
RBC: 4.22 MIL/uL (ref 3.80–5.20)
RDW: 14 % (ref 11.5–14.5)
WBC: 16.4 10*3/uL — AB (ref 3.6–11.0)

## 2017-06-06 LAB — GLUCOSE, CAPILLARY: GLUCOSE-CAPILLARY: 117 mg/dL — AB (ref 65–99)

## 2017-06-06 LAB — TROPONIN I: Troponin I: <0.03 ng/mL (ref <0.03)

## 2017-06-06 LAB — APTT: APTT: 27 s (ref 24–36)

## 2017-06-06 LAB — ETHANOL: Alcohol, Ethyl (B): <5 mg/dL (ref <5)

## 2017-06-06 LAB — PROTIME-INR
INR: 1.06
Prothrombin Time: 13.8 seconds (ref 11.4–15.2)

## 2017-06-06 MED ORDER — ASPIRIN EC 325 MG PO TBEC
325.0000 mg | DELAYED_RELEASE_TABLET | Freq: Every day | ORAL | Status: DC
  Administered 2017-06-07: 325 mg via ORAL
  Filled 2017-06-06: qty 1

## 2017-06-06 MED ORDER — ONDANSETRON HCL 4 MG/2ML IJ SOLN
4.0000 mg | Freq: Once | INTRAMUSCULAR | Status: AC
  Administered 2017-06-06: 4 mg via INTRAVENOUS
  Filled 2017-06-06: qty 2

## 2017-06-06 MED ORDER — ENOXAPARIN SODIUM 40 MG/0.4ML ~~LOC~~ SOLN
30.0000 mg | SUBCUTANEOUS | Status: DC
  Administered 2017-06-06: 30 mg via SUBCUTANEOUS
  Filled 2017-06-06: qty 0.4

## 2017-06-06 MED ORDER — ONDANSETRON HCL 4 MG PO TABS: 4.0000 mg | ORAL_TABLET | Freq: Three times a day (TID) | ORAL | Status: DC | PRN

## 2017-06-06 MED ORDER — ASPIRIN 300 MG RE SUPP
300.0000 mg | Freq: Every day | RECTAL | Status: DC
Start: 2017-06-07 — End: 2017-06-07

## 2017-06-06 MED ORDER — ACETAMINOPHEN 325 MG PO TABS
650.0000 mg | ORAL_TABLET | Freq: Two times a day (BID) | ORAL | Status: DC | PRN
Start: 2017-06-06 — End: 2017-06-07

## 2017-06-06 MED ORDER — PROMETHAZINE HCL 25 MG PO TABS: 25.0000 mg | ORAL_TABLET | Freq: Four times a day (QID) | ORAL | Status: DC | PRN

## 2017-06-06 MED ORDER — STROKE: EARLY STAGES OF RECOVERY BOOK
Freq: Once | Status: AC
Start: 2017-06-06 — End: 2017-06-06
  Administered 2017-06-06: 19:00:00

## 2017-06-06 MED ORDER — ASPIRIN 81 MG PO CHEW
324.0000 mg | CHEWABLE_TABLET | Freq: Once | ORAL | Status: AC
  Administered 2017-06-06: 324 mg via ORAL
  Filled 2017-06-06: qty 4

## 2017-06-06 MED ORDER — LEVOTHYROXINE SODIUM 50 MCG PO TABS
100.0000 ug | ORAL_TABLET | Freq: Every day | ORAL | Status: DC
  Administered 2017-06-07: 100 ug via ORAL
  Filled 2017-06-06: qty 2

## 2017-06-06 MED ORDER — POTASSIUM CHLORIDE IN NACL 20-0.9 MEQ/L-% IV SOLN
INTRAVENOUS | Status: DC
  Administered 2017-06-06: 19:00:00 via INTRAVENOUS
  Filled 2017-06-06 (×2): qty 1000

## 2017-06-06 MED ORDER — POTASSIUM CHLORIDE CRYS ER 20 MEQ PO TBCR
40.0000 meq | EXTENDED_RELEASE_TABLET | ORAL | Status: AC
  Administered 2017-06-06 (×2): 40 meq via ORAL
  Filled 2017-06-06 (×2): qty 2

## 2017-06-06 NOTE — ED Notes (Signed)
Attempted report. Rn giving meds in another room at this time. Will call back once she steps out

## 2017-06-06 NOTE — ED Provider Notes (Signed)
[facility] Emergency Department Provider Note  ____________________________________________   First MD Initiated Contact with Patient 06/06/17 1539     (approximate)  I have reviewed the triage vital signs and the nursing notes.   HISTORY  Chief Complaint Blurred Vision; Code Stroke; and Aphasia  HPI Caroline [name] is a 76 y.o. female who self presents to the emergency department with slurred speech and right arm weakness that began at about 1:15 today roughly 2 hours prior to arrival. She said she was on the telephone with her anesthesiologist that she is scheduled for an outpatient cholecystectomy next week when she began to notice that her speech was slurred and she did not sound right. She then noted weakness in her right arm and felt that she could not pick up her phone as it was suddenly lying on her chest. She does have a history of hypertension and a previous TIA September 30 of 2017. She does not smoke and is never smoked. She currently has no symptoms. She said her symptoms lasted roughly half hour and then spontaneously resolved. She has no pain no numbness and no weakness this time.   Past Medical History:  Diagnosis Date  . Hypertension   . Hypothyroidism   . Stroke [facility]    TIA 9/17  . Thyroid disease     Patient Active Problem List   Diagnosis Date Noted  . TIA (transient ischemic attack) 10/18/2016  . Carotid stenosis 02/17/2014  . HTN (hypertension) 02/17/2014  . Hyperlipidemia 02/17/2014  . Coronary disease 02/17/2014  . Abnormal echocardiogram 02/17/2014    Past Surgical History:  Procedure Laterality Date  . ABDOMINAL HYSTERECTOMY    . CARDIAC CATHETERIZATION     Negative   . COLONOSCOPY  2010  . PARTIAL HYSTERECTOMY      Prior to Admission medications   Medication Sig Start Date End Date Taking? Authorizing Provider  acetaminophen (TYLENOL) 650 MG CR tablet Take 650 mg by mouth 2 (two) times daily as needed for pain.    Yes [provider]  allopurinol (ZYLOPRIM) 100 MG tablet Take 100 mg by mouth daily.  09/08/16  Yes [provider]  amLODipine (NORVASC) 10 MG tablet Take 10 mg by mouth daily.   Yes [provider]  furosemide (LASIX) 40 MG tablet Take 40 mg by mouth daily. 04/22/17  Yes [provider]  levothyroxine (SYNTHROID, LEVOTHROID) 100 MCG tablet Take 100 mcg by mouth daily before breakfast.   Yes [provider]  lisinopril-hydrochlorothiazide (PRINZIDE,ZESTORETIC) 20-25 MG per tablet Take 1 tablet by mouth daily.   Yes [provider]  ondansetron (ZOFRAN) 4 MG tablet Take 4 mg by mouth every 8 (eight) hours as needed for nausea or vomiting.   Yes [provider]  promethazine (PHENERGAN) 25 MG tablet Take 25 mg by mouth every 6 (six) hours as needed for nausea or vomiting.   Yes [provider]    Allergies Patient has no known allergies.  Family History  Problem Relation Age of Onset  . Heart attack Father 69  . Hypertension Father   . Hyperlipidemia Father   . Heart disease Brother        valvular disease     Social History Social History  Substance Use Topics  . Smoking status: Never Smoker  . Smokeless tobacco: Never Used  . Alcohol use 0.6 oz/week    1 Glasses of wine per week     Comment: social  Review of Systems Constitutional: No fever/chills Eyes: No visual changes. ENT: No sore throat. Cardiovascular: Denies chest pain. Respiratory: Denies shortness of breath. Gastrointestinal: No abdominal pain.  Positive nausea, no vomiting.  No diarrhea.  No constipation. Genitourinary: Negative for dysuria. Musculoskeletal: Negative for back pain. Skin: Negative for rash. Neurological: Positive for slurred speech and weakness in the right arm   ____________________________________________   PHYSICAL EXAM:  VITAL SIGNS: ED Triage Vitals [06/06/17 1537]  Enc Vitals Group     BP      Pulse       Resp      Temp      Temp src      SpO2      Weight 156 lb (70.8 kg)     Height 5\' 2"  (1.575 m)     Head Circumference      Peak Flow      Pain Score 0     Pain Loc      Pain Edu?      Excl. in [place]?     Constitutional: Alert and oriented x 4 well appearing nontoxic no diaphoresis speaks in full, clear sentences Eyes: PERRL EOMI. Head: Atraumatic. Nose: No congestion/rhinnorhea. Mouth/Throat: No trismus Neck: No stridor.   Cardiovascular: Normal rate, regular rhythm. Grossly normal heart sounds.  Good peripheral circulation. Respiratory: Normal respiratory effort.  No retractions. Lungs CTAB and moving good air Gastrointestinal: Soft nontender Musculoskeletal: No lower extremity edema   Neurologic:  Normal speech and language. Cranial nerves II through XII intact No pronator drift 5 out of 5 grips biceps triceps 5 out of 5 hip flexion and hip extension plantar flexion dorsiflexion 2+ DTRs no ankle clonus Sensation intact to light touch throughout Skin:  Skin is warm, dry and intact. No rash noted. Psychiatric: Mood and affect are normal. Speech and behavior are normal.    ____________________________________________   DIFFERENTIAL  Stroke, TIA, metabolic arrangement, conversion disorder ____________________________________________   LABS (all labs ordered are listed, but only abnormal results are displayed)  Labs Reviewed  CBC - Abnormal; Notable for the following:       Result Value   WBC 16.4 (*)    All other components within normal limits  DIFFERENTIAL - Abnormal; Notable for the following:    Neutro Abs 9.4 (*)    Lymphs Abs 5.2 (*)    Monocytes Absolute 1.4 (*)    Basophils Absolute 0.2 (*)    All other components within normal limits  COMPREHENSIVE METABOLIC PANEL - Abnormal; Notable for the following:    Potassium 2.8 (*)    Chloride 95 (*)    Glucose, Bld 116 (*)    BUN 50 (*)    Creatinine, Ser 2.18 (*)    GFR calc non Af Amer 21 (*)    GFR calc  Af Amer 24 (*)    All other components within normal limits  GLUCOSE, CAPILLARY - Abnormal; Notable for the following:    Glucose-Capillary 117 (*)    All other components within normal limits  PROTIME-INR  APTT  TROPONIN I  ETHANOL  RAPID URINE DRUG SCREEN, HOSP PERFORMED  URINALYSIS, ROUTINE W REFLEX MICROSCOPIC  BASIC METABOLIC PANEL  CBG MONITORING, ED    Labs unremarkable __________________________________________  EKG  ED ECG REPORT I, Darel Hong, the attending physician, personally viewed and interpreted this ECG.  Date: 06/06/2017 Rate: 84 Rhythm: normal sinus rhythm QRS Axis: normal Intervals: normal ST/T Wave abnormalities: normal Narrative Interpretation: unremarkable _________________________________________  RADIOLOGY  Head  CT is negative for acute pathology ____________________________________________   PROCEDURES  Procedure(s) performed: no  Procedures  Critical Care performed: yes  CRITICAL CARE Performed by: Darel Hong   Total critical care time: 35 minutes  Critical care time was exclusive of separately billable procedures and treating other patients.  Critical care was necessary to treat or prevent imminent or life-threatening deterioration.  Critical care was time spent personally by me on the following activities: development of treatment plan with patient and/or surrogate as well as nursing, discussions with consultants, evaluation of patient's response to treatment, examination of patient, obtaining history from patient or surrogate, ordering and performing treatments and interventions, ordering and review of laboratory studies, ordering and review of radiographic studies, pulse oximetry and re-evaluation of patient's condition.   Observation: no ____________________________________________   INITIAL IMPRESSION / ASSESSMENT AND PLAN / ED COURSE  Pertinent labs & imaging results that were available during my care of the  patient were reviewed by me and considered in my medical decision making (see chart for details).  The patient arrives well-appearing with no neuro symptoms, however story concerning for true deficits. Code stroke called prior to my seeing the patient and I agree with having her evaluated by the specialist on-call.     ----------------------------------------- 3:45 PM on 06/06/2017 -----------------------------------------  The patient is a completely normal neurological examination at this time and I do not believe she is a TPA nor an interventional candidate. I will give her 324 of aspirin. She is currently on the phone with the specialist on-call. ____________________________________________   Specialist on-call agrees that she is not a TPA candidate but does recommend inpatient admission with MRI MRA of the head and neck.  FINAL CLINICAL IMPRESSION(S) / ED DIAGNOSES  Final diagnoses:  Transient cerebral ischemia, unspecified type      NEW MEDICATIONS STARTED DURING THIS VISIT:  New Prescriptions   No medications on file     Note:  This document was prepared using Dragon voice recognition software and may include unintentional dictation errors.     Darel Hong, MD 06/06/17 1701

## 2017-06-06 NOTE — ED Notes (Signed)
MRI screening patient at this time. 

## 2017-06-06 NOTE — Progress Notes (Signed)
Pt to 108 from ed post mri and carotid US. A/o no resp distress.  Skin w/d.  Neuro talked with pt via skype.

## 2017-06-06 NOTE — Progress Notes (Signed)
Family Meeting Note  Advance Directive:yes  Today a meeting took place with the Patient and her spouse at bedside   The following clinical team members were present during this meeting:MD  The following were discussed:Patient's diagnosis: Management and plan of care discussed in detail with the patient and spouse at bedside, Patient's progosis: Unable to determine and Goals for treatment: Full Code, husband is the healthcare power of attorney  Additional follow-up to be provided: Hospitalist, neurologist  Time spent during discussion:16 min  Alexandru Moorer, Illene Silver, MD

## 2017-06-06 NOTE — Progress Notes (Signed)
[place] responded to a PG for a Code Stroke in ED-24. Pt had just returned from CT upon my arrival. Husband was bedside. [place] provided a calming presence. CH is available for follow up as needed.     06/06/17 1538  Clinical Encounter Type  Visited With Patient;Patient and family together;Health care provider  Visit Type Initial;Spiritual support;Code;ED (Code Stroke)  Referral From Nurse  Consult/Referral To Chaplain  Spiritual Encounters  Spiritual Needs Emotional  Advance Directives (For Healthcare)  Does Patient Have a Medical Advance Directive? No  Mental Health Advance Directives  Does Patient Have a Mental Health Advance Directive? No

## 2017-06-06 NOTE — ED Notes (Addendum)
Gouru MD at bedside

## 2017-06-06 NOTE — ED Triage Notes (Signed)
Pt reports that at 115pm she started with symptoms of slurred speech, blurred vision in left eye only, and spouse states just generally not making sense - code stroke called

## 2017-06-06 NOTE — ED Notes (Signed)
Patient transferred to MRI 

## 2017-06-06 NOTE — Pre-Procedure Instructions (Signed)
PHONE INTERVIEW. PATIENT'S SPEECH SLURRED AND ANSWERS NOT ALL COHERENT. CALLED HUSBAND AND HE IS ON THE WAY TO CHECK ON HER.

## 2017-06-06 NOTE — H&P (Signed)
[facility] [place] NAME: Caroline [name]    MR#:  [id number]  DATE OF BIRTH:  1941/01/09  DATE OF ADMISSION:  06/06/2017  PRIMARY CARE PHYSICIAN: Remi Haggard, FNP   REQUESTING/REFERRING PHYSICIAN: Rifenbark, md  CHIEF COMPLAINT:   Dysarthria and right-sided weakness HISTORY OF PRESENT ILLNESS:  Caroline [name]  is a 76 y.o. 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 Her symptoms are completely resolved eventually. CT head is negative. Telemetry neurologist did not recommend  TPA as patient's symptoms are resolved. Telemetry neurologist has recommended complete stroke workup. Patient's aspirin is held for scheduled cholecystectomy by Dr. Evalee Mutton which is rescheduled for 619. Patient has received aspirin 325 mg in the ED  PAST MEDICAL HISTORY:   Past Medical History:  Diagnosis Date  . Hypertension   . Hypothyroidism   . Stroke [facility])    TIA 9/17  . Thyroid disease     PAST SURGICAL HISTOIRY:   Past Surgical History:  Procedure Laterality Date  . ABDOMINAL HYSTERECTOMY    . CARDIAC CATHETERIZATION     Negative   . COLONOSCOPY  2010  . PARTIAL HYSTERECTOMY      SOCIAL HISTORY:   Social History  Substance Use Topics  . Smoking status: Never Smoker  . Smokeless tobacco: Never Used  . Alcohol use 0.6 oz/week    1 Glasses of wine per week     Comment: social     FAMILY HISTORY:   Family History  Problem Relation Age of Onset  . Heart attack Father 46  . Hypertension Father   . Hyperlipidemia Father   . Heart disease Brother        valvular disease     DRUG ALLERGIES:  No Known Allergies  REVIEW OF SYSTEMS:  CONSTITUTIONAL: No fever, fatigue or weakness.  EYES: No blurred or double vision.  EARS, NOSE, AND THROAT: No tinnitus or ear pain.  RESPIRATORY: No cough, shortness of  breath, wheezing or hemoptysis.  CARDIOVASCULAR: No chest pain, orthopnea, edema.  GASTROINTESTINAL: No nausea, vomiting, diarrhea or abdominal pain.  GENITOURINARY: No dysuria, hematuria.  ENDOCRINE: No polyuria, nocturia,  HEMATOLOGY: No anemia, easy bruising or bleeding SKIN: No rash or lesion. MUSCULOSKELETAL: No joint pain or arthritis.   NEUROLOGIC: No tingling, numbness, weakness.  PSYCHIATRY: No anxiety or depression.   MEDICATIONS AT HOME:   Prior to Admission medications   Medication Sig Start Date End Date Taking? Authorizing Provider  acetaminophen (TYLENOL) 650 MG CR tablet Take 650 mg by mouth 2 (two) times daily as needed for pain.   Yes [provider]  allopurinol (ZYLOPRIM) 100 MG tablet Take 100 mg by mouth daily.  09/08/16  Yes [provider]  amLODipine (NORVASC) 10 MG tablet Take 10 mg by mouth daily.   Yes [provider]  furosemide (LASIX) 40 MG tablet Take 40 mg by mouth daily. 04/22/17  Yes [provider]  levothyroxine (SYNTHROID, LEVOTHROID) 100 MCG tablet Take 100 mcg by mouth daily before breakfast.   Yes [provider]  lisinopril-hydrochlorothiazide (PRINZIDE,ZESTORETIC) 20-25 MG per tablet Take 1 tablet by mouth daily.   Yes [provider]  ondansetron (ZOFRAN) 4 MG tablet Take 4 mg by mouth every 8 (eight) hours as needed for nausea or vomiting.   Yes [provider]  promethazine (PHENERGAN) 25  MG tablet Take 25 mg by mouth every 6 (six) hours as needed for nausea or vomiting.   Yes [provider]      VITAL SIGNS:  Blood pressure (!) 162/70, pulse (!) 165, temperature 98 F (36.7 C), resp. rate (!) 23, height 5\' 2"  (1.575 m), weight 70.8 kg (156 lb), SpO2 100 %.  PHYSICAL EXAMINATION:  GENERAL:  76 y.o.-year-old patient lying in the bed with no acute distress.  EYES: Pupils equal, round, reactive to light and accommodation. No scleral icterus. Extraocular muscles intact.   HEENT: Head atraumatic, normocephalic. Oropharynx and nasopharynx clear.  NECK:  Supple, no jugular venous distention. No thyroid enlargement, no tenderness.  LUNGS: Normal breath sounds bilaterally, no wheezing, rales,rhonchi or crepitation. No use of accessory muscles of respiration.  CARDIOVASCULAR: S1, S2 normal. No murmurs, rubs, or gallops.  ABDOMEN: Soft, nontender, nondistended. Bowel sounds present. No organomegaly or mass.  EXTREMITIES: No pedal edema, cyanosis, or clubbing.  NEUROLOGIC: Cranial nerves II through XII are intact. Muscle strength 5/5 in all extremities. Sensation intact. Gait not checked.  PSYCHIATRIC: The patient is alert and oriented x 3.  SKIN: No obvious rash, lesion, or ulcer.   LABORATORY PANEL:   CBC  Recent Labs Lab 06/06/17 1542  WBC 16.4*  HGB 13.9  HCT 40.7  PLT 316   ------------------------------------------------------------------------------------------------------------------  Chemistries   Recent Labs Lab 06/06/17 1542  NA 135  K 2.8*  CL 95*  CO2 27  GLUCOSE 116*  BUN 50*  CREATININE 2.18*  CALCIUM 10.1  AST 24  ALT 23  ALKPHOS 44  BILITOT 0.7   ------------------------------------------------------------------------------------------------------------------  Cardiac Enzymes  Recent Labs Lab 06/06/17 1542  TROPONINI <0.03   ------------------------------------------------------------------------------------------------------------------  RADIOLOGY:  Ct Head Code Stroke W/o Cm  Result Date: 06/06/2017 CLINICAL DATA:  Code stroke. Slurred speech and visual change left eye EXAM: CT HEAD WITHOUT CONTRAST TECHNIQUE: Contiguous axial images were obtained from the base of the skull through the vertex without intravenous contrast. COMPARISON:  None. FINDINGS: Brain: No evidence of acute infarction, hemorrhage, hydrocephalus, extra-axial collection or mass lesion/mass effect. Vascular: Carotid artery calcification. Negative  for hyperdense vessel Skull: Negative Sinuses/Orbits: Negative Other: None ASPECTS ([place] Stroke Program Early CT Score) - Ganglionic level infarction (caudate, lentiform nuclei, internal capsule, insula, M1-M3 cortex): 7 - Supraganglionic infarction (M4-M6 cortex): 3 Total score (0-10 with 10 being normal): 10 IMPRESSION: 1. No acute abnormality 2. ASPECTS is 10 These results were called by telephone at the time of interpretation on 06/06/2017 at 3:40 pm to Dr. Darel Hong , who verbally acknowledged these results. Electronically Signed   By: Franchot Gallo M.D.   On: 06/06/2017 15:40    EKG:   Orders placed or performed during the hospital encounter of 06/06/17  . ED EKG  . ED EKG    IMPRESSION AND PLAN:   Caroline [name]  is a 76 y.o. 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 Has symptoms are completely resolved eventually. CT head is negative. Telemetry neurologist did not recommend  TPA as patient's symptoms are resolved. Telemetry neurologist has recommended complete stroke workup.  #TIA Admit to MedSurg unit Symptoms resolved Complete stroke workup with MRI brain MRA of the head and neck, carotid Dopplers and 2-D echocardiogram Aspirin 325 mg was given in the ED will continue the same Patient aspirin was held as she is scheduled  for cholecystectomy by Dr. Evalee Mutton which is rescheduled for June 19 Patient passed bedside swallow evaluation TSH and fasting lipid panel in a.m. Check hemoglobin A1c PT and OT evaluation Neurology consult  #Hyperlipidemia check fasting lipid panel and continue statin  #Acute kidney injury with hypokalemia Hydrated with IV fluids Creatinine at 2.18 Avoid nephrotoxins Nephrology consult if no improvement with IV fluids  replete potassium and check BMP and magnesium in a.m.  #Hypertension hold home medications and allow  permissive hypertension while ruling out for TIA versus CVA Patient admits that her lisinopril dose was reduced to have by nephrologist for renal insufficiency and patient started taking the new reduced dose from yesterday, will hold off on lisinopril  #History of gout Status post joint injection Monitor renal function closely as patient reports that she has some renal insufficiency    All the records are reviewed and case discussed with ED provider. Management plans discussed with the patient, family and they are in agreement.  CODE STATUS: fc, husband is the healthcare power of attorney  TOTAL TIME TAKING CARE OF THIS PATIENT: 43 minutes.   Note: This dictation was prepared with Dragon dictation along with smaller phrase technology. Any transcriptional errors that result from this process are unintentional.  Nicholes Mango M.D on 06/06/2017 at 4:58 PM  Between 7am to 6pm - Pager - [phone]  After 6pm go to [email or web address] - password [email or web address] [facility]  Office  [phone]  CC: Primary care physician; Remi Haggard, FNP

## 2017-06-06 NOTE — ED Notes (Signed)
Denies chest pain at this time.

## 2017-06-06 NOTE — ED Notes (Addendum)
HX TIA September 30th 2017

## 2017-06-06 NOTE — ED Notes (Addendum)
MD Neurologist Duffy on [facility] speaking with patient at this time

## 2017-06-07 ENCOUNTER — Telehealth: Payer: Self-pay | Admitting: *Deleted

## 2017-06-07 DIAGNOSIS — Z79899 Other long term (current) drug therapy: Secondary | ICD-10-CM | POA: Diagnosis not present

## 2017-06-07 DIAGNOSIS — E876 Hypokalemia: Secondary | ICD-10-CM | POA: Diagnosis present

## 2017-06-07 DIAGNOSIS — N183 Chronic kidney disease, stage 3 (moderate): Secondary | ICD-10-CM | POA: Diagnosis present

## 2017-06-07 DIAGNOSIS — I639 Cerebral infarction, unspecified: Secondary | ICD-10-CM | POA: Diagnosis present

## 2017-06-07 DIAGNOSIS — G8191 Hemiplegia, unspecified affecting right dominant side: Secondary | ICD-10-CM | POA: Diagnosis present

## 2017-06-07 DIAGNOSIS — Z7982 Long term (current) use of aspirin: Secondary | ICD-10-CM | POA: Diagnosis not present

## 2017-06-07 DIAGNOSIS — R4701 Aphasia: Secondary | ICD-10-CM | POA: Diagnosis present

## 2017-06-07 DIAGNOSIS — N179 Acute kidney failure, unspecified: Secondary | ICD-10-CM | POA: Diagnosis present

## 2017-06-07 DIAGNOSIS — E039 Hypothyroidism, unspecified: Secondary | ICD-10-CM | POA: Diagnosis present

## 2017-06-07 DIAGNOSIS — G459 Transient cerebral ischemic attack, unspecified: Secondary | ICD-10-CM

## 2017-06-07 DIAGNOSIS — I129 Hypertensive chronic kidney disease with stage 1 through stage 4 chronic kidney disease, or unspecified chronic kidney disease: Secondary | ICD-10-CM | POA: Diagnosis present

## 2017-06-07 DIAGNOSIS — E785 Hyperlipidemia, unspecified: Secondary | ICD-10-CM | POA: Diagnosis present

## 2017-06-07 LAB — LIPID PANEL
Cholesterol: 281 mg/dL — ABNORMAL HIGH (ref 0–200)
HDL: 30 mg/dL — AB (ref >40)
LDL CALC: UNDETERMINED mg/dL (ref 0–99)
Total CHOL/HDL Ratio: 9.4 RATIO
Triglycerides: 448 mg/dL — ABNORMAL HIGH (ref <150)
VLDL: UNDETERMINED mg/dL (ref 0–40)

## 2017-06-07 LAB — BASIC METABOLIC PANEL
Anion gap: 9 (ref 5–15)
BUN: 45 mg/dL — AB (ref 6–20)
CALCIUM: 9.2 mg/dL (ref 8.9–10.3)
CO2: 25 mmol/L (ref 22–32)
CREATININE: 2.03 mg/dL — AB (ref 0.44–1.00)
Chloride: 106 mmol/L (ref 101–111)
GFR calc Af Amer: 26 mL/min — ABNORMAL LOW (ref >60)
GFR, EST NON AFRICAN AMERICAN: 23 mL/min — AB (ref >60)
GLUCOSE: 94 mg/dL (ref 65–99)
POTASSIUM: 3.8 mmol/L (ref 3.5–5.1)
Sodium: 140 mmol/L (ref 135–145)

## 2017-06-07 LAB — ECHOCARDIOGRAM COMPLETE
HEIGHTINCHES: 62 in
Weight: 2496 oz

## 2017-06-07 LAB — MAGNESIUM: Magnesium: 2.1 mg/dL (ref 1.7–2.4)

## 2017-06-07 MED ORDER — SIMVASTATIN 20 MG PO TABS: 20.0000 mg | ORAL_TABLET | Freq: Every day | ORAL | Status: DC

## 2017-06-07 MED ORDER — SIMVASTATIN 20 MG PO TABS: 20.0000 mg | ORAL_TABLET | Freq: Every day | ORAL | 0 refills | Status: DC

## 2017-06-07 MED ORDER — ASPIRIN 325 MG PO TBEC: 325.0000 mg | DELAYED_RELEASE_TABLET | Freq: Every day | ORAL | 0 refills | Status: DC

## 2017-06-07 NOTE — Progress Notes (Signed)
[facility] NAME: Caroline [name]    MR#:  [id number]  DATE OF BIRTH:  02/26/1941 CHIEF COMPLAINT:   Patient MRI brain confirmed small acute infarcts in the right postcentral [place].. Denies any weakness in the right side or slurred speech.     Chief Complaint  Patient presents with  . Blurred Vision  . Code Stroke  . Aphasia    REVIEW OF SYSTEMS:   ROS CONSTITUTIONAL: No fever, fatigue or weakness.  EYES: No blurred or double vision.  EARS, NOSE, AND THROAT: No tinnitus or ear pain.  RESPIRATORY: No cough, shortness of breath, wheezing or hemoptysis.  CARDIOVASCULAR: No chest pain, orthopnea, edema.  GASTROINTESTINAL: No nausea, vomiting, diarrhea or abdominal pain.  GENITOURINARY: No dysuria, hematuria.  ENDOCRINE: No polyuria, nocturia,  HEMATOLOGY: No anemia, easy bruising or bleeding SKIN: No rash or lesion. MUSCULOSKELETAL: No joint pain or arthritis.   NEUROLOGIC: No tingling, numbness, weakness.  PSYCHIATRY: No anxiety or depression.   DRUG ALLERGIES:  No Known Allergies  VITALS:  Blood pressure (!) 112/53, pulse (!) 103, temperature 97.9 F (36.6 C), temperature source Oral, resp. rate 20, height 5\' 2"  (1.575 m), weight 70.8 kg (156 lb), SpO2 97 %.  PHYSICAL EXAMINATION:  GENERAL:  76 y.o.-year-old patient lying in the bed with no acute distress.  EYES: Pupils equal, round, reactive to light .Marland Kitchen No scleral icterus. Extraocular muscles intact.  HEENT: Head atraumatic, normocephalic. Oropharynx and nasopharynx clear.  NECK:  Supple, no jugular venous distention. No thyroid enlargement, no tenderness. Patient has left carotid bruit. LUNGS: Normal breath sounds bilaterally, no wheezing, rales,rhonchi or crepitation. No use of accessory muscles of respiration.  CARDIOVASCULAR: S1, S2 normal. No murmurs, rubs, or gallops.  ABDOMEN: Soft, nontender, nondistended. Bowel sounds present. No organomegaly or mass.   EXTREMITIES: No pedal edema, cyanosis, or clubbing.  NEUROLOGIC: Cranial nerves II through XII are intact. Muscle strength 5/5 in all extremities. Sensation intact. Gait not checked.  PSYCHIATRIC: The patient is alert and oriented x 3.  SKIN: No obvious rash, lesion, or ulcer.    LABORATORY PANEL:   CBC  Recent Labs Lab 06/06/17 1542  WBC 16.4*  HGB 13.9  HCT 40.7  PLT 316   ------------------------------------------------------------------------------------------------------------------  Chemistries   Recent Labs Lab 06/06/17 1542 06/07/17 0503  NA 135 140  K 2.8* 3.8  CL 95* 106  CO2 27 25  GLUCOSE 116* 94  BUN 50* 45*  CREATININE 2.18* 2.03*  CALCIUM 10.1 9.2  MG  --  2.1  AST 24  --   ALT 23  --   ALKPHOS 44  --   BILITOT 0.7  --    ------------------------------------------------------------------------------------------------------------------  Cardiac Enzymes  Recent Labs Lab 06/06/17 1542  TROPONINI <0.03   ------------------------------------------------------------------------------------------------------------------  RADIOLOGY:  Mr Jodene Nam Head Wo Contrast  Result Date: 06/06/2017 CLINICAL DATA:  Slurred speech and right arm weakness EXAM: MRI HEAD WITHOUT CONTRAST MRA HEAD WITHOUT CONTRAST MRA NECK WITHOUT CONTRAST TECHNIQUE: Multiplanar, multiecho pulse sequences of the brain and surrounding structures were obtained without intravenous contrast. Angiographic images of the Circle of Willis were obtained using MRA technique without intravenous contrast. Angiographic images of the neck were obtained using MRA technique without intravenous contrast. Carotid stenosis measurements (when applicable) are obtained utilizing NASCET criteria, using the distal internal carotid diameter as the denominator. COMPARISON:  Head CT 06/06/2017 FINDINGS: MRI HEAD FINDINGS Brain: The midline structures are normal. There is a punctate focus of diffusion  restriction along  the right postcentral gyrus. There is multifocal hyperintense T2-weighted signal within the periventricular white matter, most often seen in the setting of chronic microvascular ischemia. No mass lesion. No chronic microhemorrhage or cerebral amyloid angiopathy. No hydrocephalus, age advanced atrophy or lobar predominant volume loss. No dural abnormality or extra-axial collection. Skull and upper cervical spine: The visualized skull base, calvarium, upper cervical spine and extracranial soft tissues are normal. Sinuses/Orbits: No fluid levels or advanced mucosal thickening. No mastoid effusion. Normal orbits. MRA HEAD FINDINGS Intracranial internal carotid arteries: Normal. Anterior cerebral arteries: Small medial projecting outpouching from the left anterior cerebral artery in the expected location of the anterior communicating artery does not clearly connect to the right ACA. There is also moderate narrowing at the origin of the right A1 segment. Otherwise, the anterior cerebral arteries are normal. Middle cerebral arteries: There is multifocal mild narrowing of both middle cerebral arteries. Posterior communicating arteries: Present bilaterally. Posterior cerebral arteries: Normal. Basilar artery: Normal. Vertebral arteries: Right-dominant. Limited flow related enhancement of the left vertebral artery. Superior cerebellar arteries: Normal. Anterior inferior cerebellar arteries: Normal. Posterior inferior cerebellar arteries: Normal on the right. Poorly visualized on the left. MRA NECK FINDINGS Time-of-flight angiography of the neck shows is less than 50% narrowing of the distal common carotid arteries bilaterally with wide patency of the internal carotid arteries. The right vertebral artery is dominant and normal along its entire cervical course. There is limited flow related enhancement of the diminutive left vertebral artery, which have lost along its mid cervical course. This is a new finding compared to CTA  of the neck 04/20/2015. IMPRESSION: 1. Punctate acute infarct in the right postcentral gyrus. This is not in keeping with the reported right-sided symptoms and would be more expected to cause a left-sided sensory deficit. 2. No emergent large intracranial vessel occlusion. No high-grade intracranial artery stenosis. 3. Suspected occlusion of the distal V2 segment of the left vertebral artery with minimal enhancement of the distal V4 segment likely secondary to retrograde flow. CTA of the neck might be helpful to assess for a degree of residual patency as MRA may overestimate stenoses. 4. Medially projecting outpouching from the left anterior cerebral artery near the expected origin of the A-comm. This suspected to be an irregular A-comm or an infundibulum, but a small aneurysm would be difficult to exclude entirely. Electronically Signed   By: Ulyses Jarred M.D.   On: 06/06/2017 18:46   Mr Jodene Nam Neck Wo Contrast  Result Date: 06/06/2017 CLINICAL DATA:  Slurred speech and right arm weakness EXAM: MRI HEAD WITHOUT CONTRAST MRA HEAD WITHOUT CONTRAST MRA NECK WITHOUT CONTRAST TECHNIQUE: Multiplanar, multiecho pulse sequences of the brain and surrounding structures were obtained without intravenous contrast. Angiographic images of the Circle of Willis were obtained using MRA technique without intravenous contrast. Angiographic images of the neck were obtained using MRA technique without intravenous contrast. Carotid stenosis measurements (when applicable) are obtained utilizing NASCET criteria, using the distal internal carotid diameter as the denominator. COMPARISON:  Head CT 06/06/2017 FINDINGS: MRI HEAD FINDINGS Brain: The midline structures are normal. There is a punctate focus of diffusion restriction along the right postcentral gyrus. There is multifocal hyperintense T2-weighted signal within the periventricular white matter, most often seen in the setting of chronic microvascular ischemia. No mass lesion. No  chronic microhemorrhage or cerebral amyloid angiopathy. No hydrocephalus, age advanced atrophy or lobar predominant volume loss. No dural abnormality or extra-axial collection. Skull and upper cervical spine: The visualized skull base, calvarium,  upper cervical spine and extracranial soft tissues are normal. Sinuses/Orbits: No fluid levels or advanced mucosal thickening. No mastoid effusion. Normal orbits. MRA HEAD FINDINGS Intracranial internal carotid arteries: Normal. Anterior cerebral arteries: Small medial projecting outpouching from the left anterior cerebral artery in the expected location of the anterior communicating artery does not clearly connect to the right ACA. There is also moderate narrowing at the origin of the right A1 segment. Otherwise, the anterior cerebral arteries are normal. Middle cerebral arteries: There is multifocal mild narrowing of both middle cerebral arteries. Posterior communicating arteries: Present bilaterally. Posterior cerebral arteries: Normal. Basilar artery: Normal. Vertebral arteries: Right-dominant. Limited flow related enhancement of the left vertebral artery. Superior cerebellar arteries: Normal. Anterior inferior cerebellar arteries: Normal. Posterior inferior cerebellar arteries: Normal on the right. Poorly visualized on the left. MRA NECK FINDINGS Time-of-flight angiography of the neck shows is less than 50% narrowing of the distal common carotid arteries bilaterally with wide patency of the internal carotid arteries. The right vertebral artery is dominant and normal along its entire cervical course. There is limited flow related enhancement of the diminutive left vertebral artery, which have lost along its mid cervical course. This is a new finding compared to CTA of the neck 04/20/2015. IMPRESSION: 1. Punctate acute infarct in the right postcentral gyrus. This is not in keeping with the reported right-sided symptoms and would be more expected to cause a left-sided  sensory deficit. 2. No emergent large intracranial vessel occlusion. No high-grade intracranial artery stenosis. 3. Suspected occlusion of the distal V2 segment of the left vertebral artery with minimal enhancement of the distal V4 segment likely secondary to retrograde flow. CTA of the neck might be helpful to assess for a degree of residual patency as MRA may overestimate stenoses. 4. Medially projecting outpouching from the left anterior cerebral artery near the expected origin of the A-comm. This suspected to be an irregular A-comm or an infundibulum, but a small aneurysm would be difficult to exclude entirely. Electronically Signed   By: Ulyses Jarred M.D.   On: 06/06/2017 18:46   Mr Brain Wo Contrast (neuro Protocol)  Result Date: 06/06/2017 CLINICAL DATA:  Slurred speech and right arm weakness EXAM: MRI HEAD WITHOUT CONTRAST MRA HEAD WITHOUT CONTRAST MRA NECK WITHOUT CONTRAST TECHNIQUE: Multiplanar, multiecho pulse sequences of the brain and surrounding structures were obtained without intravenous contrast. Angiographic images of the Circle of Willis were obtained using MRA technique without intravenous contrast. Angiographic images of the neck were obtained using MRA technique without intravenous contrast. Carotid stenosis measurements (when applicable) are obtained utilizing NASCET criteria, using the distal internal carotid diameter as the denominator. COMPARISON:  Head CT 06/06/2017 FINDINGS: MRI HEAD FINDINGS Brain: The midline structures are normal. There is a punctate focus of diffusion restriction along the right postcentral gyrus. There is multifocal hyperintense T2-weighted signal within the periventricular white matter, most often seen in the setting of chronic microvascular ischemia. No mass lesion. No chronic microhemorrhage or cerebral amyloid angiopathy. No hydrocephalus, age advanced atrophy or lobar predominant volume loss. No dural abnormality or extra-axial collection. Skull and upper  cervical spine: The visualized skull base, calvarium, upper cervical spine and extracranial soft tissues are normal. Sinuses/Orbits: No fluid levels or advanced mucosal thickening. No mastoid effusion. Normal orbits. MRA HEAD FINDINGS Intracranial internal carotid arteries: Normal. Anterior cerebral arteries: Small medial projecting outpouching from the left anterior cerebral artery in the expected location of the anterior communicating artery does not clearly connect to the right ACA. There is also  moderate narrowing at the origin of the right A1 segment. Otherwise, the anterior cerebral arteries are normal. Middle cerebral arteries: There is multifocal mild narrowing of both middle cerebral arteries. Posterior communicating arteries: Present bilaterally. Posterior cerebral arteries: Normal. Basilar artery: Normal. Vertebral arteries: Right-dominant. Limited flow related enhancement of the left vertebral artery. Superior cerebellar arteries: Normal. Anterior inferior cerebellar arteries: Normal. Posterior inferior cerebellar arteries: Normal on the right. Poorly visualized on the left. MRA NECK FINDINGS Time-of-flight angiography of the neck shows is less than 50% narrowing of the distal common carotid arteries bilaterally with wide patency of the internal carotid arteries. The right vertebral artery is dominant and normal along its entire cervical course. There is limited flow related enhancement of the diminutive left vertebral artery, which have lost along its mid cervical course. This is a new finding compared to CTA of the neck 04/20/2015. IMPRESSION: 1. Punctate acute infarct in the right postcentral gyrus. This is not in keeping with the reported right-sided symptoms and would be more expected to cause a left-sided sensory deficit. 2. No emergent large intracranial vessel occlusion. No high-grade intracranial artery stenosis. 3. Suspected occlusion of the distal V2 segment of the left vertebral artery with  minimal enhancement of the distal V4 segment likely secondary to retrograde flow. CTA of the neck might be helpful to assess for a degree of residual patency as MRA may overestimate stenoses. 4. Medially projecting outpouching from the left anterior cerebral artery near the expected origin of the A-comm. This suspected to be an irregular A-comm or an infundibulum, but a small aneurysm would be difficult to exclude entirely. Electronically Signed   By: Ulyses Jarred M.D.   On: 06/06/2017 18:46   Ct Head Code Stroke W/o Cm  Result Date: 06/06/2017 CLINICAL DATA:  Code stroke. Slurred speech and visual change left eye EXAM: CT HEAD WITHOUT CONTRAST TECHNIQUE: Contiguous axial images were obtained from the base of the skull through the vertex without intravenous contrast. COMPARISON:  None. FINDINGS: Brain: No evidence of acute infarction, hemorrhage, hydrocephalus, extra-axial collection or mass lesion/mass effect. Vascular: Carotid artery calcification. Negative for hyperdense vessel Skull: Negative Sinuses/Orbits: Negative Other: None ASPECTS ([place] Stroke Program Early CT Score) - Ganglionic level infarction (caudate, lentiform nuclei, internal capsule, insula, M1-M3 cortex): 7 - Supraganglionic infarction (M4-M6 cortex): 3 Total score (0-10 with 10 being normal): 10 IMPRESSION: 1. No acute abnormality 2. ASPECTS is 10 These results were called by telephone at the time of interpretation on 06/06/2017 at 3:40 pm to Dr. Darel Hong , who verbally acknowledged these results. Electronically Signed   By: Franchot Gallo M.D.   On: 06/06/2017 15:40    EKG:   Orders placed or performed during the hospital encounter of 06/06/17  . ED EKG  . ED EKG    ASSESSMENT AND PLAN:    #1. 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 Continue aspirin, statin should be started because her LDL is not able to be calculated because of high triglycerides. neurology follow-up is appreciated.  #2 acute  kidney injury with hypokalemia: Patient baseline creatinine 1.93. Kidney function improving with IV hydration.  History of gallstones, nausea, patient scheduled to have gallbladder operation on June 19 by Dr.Sankar. Patient is on aspirin at this time because of acute stroke. Neurology consult appreciated, if patient is stable for discharge home, Need to know if patient can be off for aspirin until June 19.  #4 hyperlipidemia: Started to atorvastatin.. D/w pt. D/w RN All the records are  reviewed and case discussed with Care Management/Social Workerr. Management plans discussed with the patient, family and they are in agreement.  CODE STATUS: full  TOTAL TIME TAKING CARE OF THIS PATIENT: 104minutes.   POSSIBLE D/C IN 1-2 DAYS, DEPENDING ON CLINICAL CONDITION.   Epifanio Lesches M.D on 06/07/2017 at 8:42 AM  Between 7am to 6pm - Pager - [phone]  After 6pm go to [email or web address] - password [email or web address] [facility]  Office  [phone]  CC: Primary care physician; Remi Haggard, FNP   Note: This dictation was prepared with Dragon dictation along with smaller phrase technology. Any transcriptional errors that result from this process are unintentional.

## 2017-06-07 NOTE — Progress Notes (Signed)
SLP Cancellation Note  Patient Details Name: Caroline [name] MRN: [id number] DOB: 06-04-1941   Cancelled treatment:       Reason Eval/Treat Not Completed: SLP screened, no needs identified, will sign off (chart reviewed; consulted NSG and pt/husband).  Pt denied any difficulty swallowing and is currently on a regular diet; tolerates swallowing pills w/ water per NSG. Pt conversed at conversational level w/out deficits noted; pt and husband denied any speech-language deficits.  No further skilled ST services indicated as pt appears at her baseline. Pt agreed. NSG to reconsult if any change in status.    Orinda Kenner, MS, CCC-SLP Mikhael Hendriks 06/07/2017, 2:42 PM

## 2017-06-07 NOTE — Progress Notes (Signed)
Chaplain received a Code Stroke. Once I entered the ED the Nurse told me She was doing better and to talk to her and her husband. Once I entered The room they were happy to see me and said Caroline [name] had already came to see them and Was glad that [name] sent me to check on them.

## 2017-06-07 NOTE — Discharge Summary (Signed)
Caroline [name], is a 76 y.o. female  DOB Aug 08, 1941  MRN [id number].  Admission date:  06/06/2017  Admitting Physician  Nicholes Mango, MD  Discharge Date:  06/07/2017   Primary MD  Remi Haggard, FNP  Recommendations for primary care physician for things to follow:   Follow-up with PCP in one week Follow up with Dr. Jamal Collin  in 1 week.   Admission Diagnosis  TIA (transient ischemic attack) [G45.9] CVA (cerebral vascular accident) ([place]) [I63.9] Transient cerebral ischemia, unspecified type [G45.9]   Discharge Diagnosis  TIA (transient ischemic attack) [G45.9] CVA (cerebral vascular accident) ([place]) [I63.9] Transient cerebral ischemia, unspecified type [G45.9]    Active Problems:   TIA (transient ischemic attack)   CVA (cerebral vascular accident) [facility])      Past Medical History:  Diagnosis Date  . Hypertension   . Hypothyroidism   . Stroke [facility])    TIA 9/17  . Thyroid disease     Past Surgical History:  Procedure Laterality Date  . ABDOMINAL HYSTERECTOMY    . CARDIAC CATHETERIZATION     Negative   . COLONOSCOPY  2010  . PARTIAL HYSTERECTOMY         History of present illness and  Hospital Course:     Kindly see H&P for history of present illness and admission details, please review complete Labs, Consult reports and Test reports for all details in brief  HPI  from the history and physical done on the day of admission Caroline [name] 70-year-old female with history of 58-year-old TIA came in because of slurred speech, right-sided weakness. Head CAT scan is negative in the emergency room. Admitted for evaluation of TIA.   Hospital Course  #1 acute stroke and right postcentral virus. Patient had slurred speech, right-sided weakness started yesterday afternoon but symptoms resolved by the time she came to ER .  Admitted to hospitalist service, MRI of the brain, MRA of the brain, an echocardiogram done, started on aspirin. Seen by neurology. Physical therapy, occupational therapy did not recommend any needs for her. Patient is having no neurological deficit today. Started on aspirin, patient was not taking aspirin before. 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 And advised her to continue aspirin. Patient has elevated the lipid panel with LDL unable to be calculated because of high triglycerides. started on statins. Echocardiogram showed EF of 65-70%. #2 nausea, scheduled for elective gallbladder operation by Dr. Jamal Collin, advised the patient to postpone surgery at least for 30 days as greatest risk of repeat  Stroke is within that period. Patient needs to be off aspirin for 5 days prior to procedure. #3 hypothyroidism #4 hypertension  5,acute renal failure on chronic kidney disease stage III: Patient creatinine 2.18 on admission improved with IV fluids to 2.03. Patient creatinine 1.93 on June 6th,  discontinueHydrochlorothiazide and lisinopril. Discharge Condition: stable   Follow UP      Discharge Instructions  and  Discharge Medications      Allergies as of 06/07/2017   No Known Allergies     Medication List    STOP taking these medications   furosemide 40 MG tablet Commonly known as:  LASIX   lisinopril-hydrochlorothiazide 20-25 MG tablet Commonly known as:  PRINZIDE,ZESTORETIC     TAKE these medications   acetaminophen 650 MG CR tablet Commonly known as:  TYLENOL Take 650 mg by mouth 2 (two) times daily as needed for pain.   allopurinol 100 MG tablet Commonly known as:  ZYLOPRIM Take 100 mg by  mouth daily.   amLODipine 10 MG tablet Commonly known as:  NORVASC Take 10 mg by mouth daily.   aspirin 325 MG EC tablet Take 1 tablet (325 mg total) by mouth daily. Start taking on:  06/08/2017   levothyroxine 100 MCG tablet Commonly known as:  SYNTHROID,  LEVOTHROID Take 100 mcg by mouth daily before breakfast.   ondansetron 4 MG tablet Commonly known as:  ZOFRAN Take 4 mg by mouth every 8 (eight) hours as needed for nausea or vomiting.   promethazine 25 MG tablet Commonly known as:  PHENERGAN Take 25 mg by mouth every 6 (six) hours as needed for nausea or vomiting.   simvastatin 20 MG tablet Commonly known as:  ZOCOR Take 1 tablet (20 mg total) by mouth daily at 6 PM.         Diet and Activity recommendation: See Discharge Instructions above   Consults obtained - neuro   Major procedures and Radiology Reports - PLEASE review detailed and final reports for all details, in brief -      Mr Jodene Nam Head Wo Contrast  Result Date: 06/06/2017 CLINICAL DATA:  Slurred speech and right arm weakness EXAM: MRI HEAD WITHOUT CONTRAST MRA HEAD WITHOUT CONTRAST MRA NECK WITHOUT CONTRAST TECHNIQUE: Multiplanar, multiecho pulse sequences of the brain and surrounding structures were obtained without intravenous contrast. Angiographic images of the Circle of Willis were obtained using MRA technique without intravenous contrast. Angiographic images of the neck were obtained using MRA technique without intravenous contrast. Carotid stenosis measurements (when applicable) are obtained utilizing NASCET criteria, using the distal internal carotid diameter as the denominator. COMPARISON:  Head CT 06/06/2017 FINDINGS: MRI HEAD FINDINGS Brain: The midline structures are normal. There is a punctate focus of diffusion restriction along the right postcentral gyrus. There is multifocal hyperintense T2-weighted signal within the periventricular white matter, most often seen in the setting of chronic microvascular ischemia. No mass lesion. No chronic microhemorrhage or cerebral amyloid angiopathy. No hydrocephalus, age advanced atrophy or lobar predominant volume loss. No dural abnormality or extra-axial collection. Skull and upper cervical spine: The visualized skull  base, calvarium, upper cervical spine and extracranial soft tissues are normal. Sinuses/Orbits: No fluid levels or advanced mucosal thickening. No mastoid effusion. Normal orbits. MRA HEAD FINDINGS Intracranial internal carotid arteries: Normal. Anterior cerebral arteries: Small medial projecting outpouching from the left anterior cerebral artery in the expected location of the anterior communicating artery does not clearly connect to the right ACA. There is also moderate narrowing at the origin of the right A1 segment. Otherwise, the anterior cerebral arteries are normal. Middle cerebral arteries: There is multifocal mild narrowing of both middle cerebral arteries. Posterior communicating arteries: Present bilaterally. Posterior cerebral arteries: Normal. Basilar artery: Normal. Vertebral arteries: Right-dominant. Limited flow related enhancement of the left vertebral artery. Superior cerebellar arteries: Normal. Anterior inferior cerebellar arteries: Normal. Posterior inferior cerebellar arteries: Normal on the right. Poorly visualized on the left. MRA NECK FINDINGS Time-of-flight angiography of the neck shows is less than 50% narrowing of the distal common carotid arteries bilaterally with wide patency of the internal carotid arteries. The right vertebral artery is dominant and normal along its entire cervical course. There is limited flow related enhancement of the diminutive left vertebral artery, which have lost along its mid cervical course. This is a new finding compared to CTA of the neck 04/20/2015. IMPRESSION: 1. Punctate acute infarct in the right postcentral gyrus. This is not in keeping with the reported right-sided symptoms and would be more  expected to cause a left-sided sensory deficit. 2. No emergent large intracranial vessel occlusion. No high-grade intracranial artery stenosis. 3. Suspected occlusion of the distal V2 segment of the left vertebral artery with minimal enhancement of the distal V4  segment likely secondary to retrograde flow. CTA of the neck might be helpful to assess for a degree of residual patency as MRA may overestimate stenoses. 4. Medially projecting outpouching from the left anterior cerebral artery near the expected origin of the A-comm. This suspected to be an irregular A-comm or an infundibulum, but a small aneurysm would be difficult to exclude entirely. Electronically Signed   By: Ulyses Jarred M.D.   On: 06/06/2017 18:46   Mr Jodene Nam Neck Wo Contrast  Result Date: 06/06/2017 CLINICAL DATA:  Slurred speech and right arm weakness EXAM: MRI HEAD WITHOUT CONTRAST MRA HEAD WITHOUT CONTRAST MRA NECK WITHOUT CONTRAST TECHNIQUE: Multiplanar, multiecho pulse sequences of the brain and surrounding structures were obtained without intravenous contrast. Angiographic images of the Circle of Willis were obtained using MRA technique without intravenous contrast. Angiographic images of the neck were obtained using MRA technique without intravenous contrast. Carotid stenosis measurements (when applicable) are obtained utilizing NASCET criteria, using the distal internal carotid diameter as the denominator. COMPARISON:  Head CT 06/06/2017 FINDINGS: MRI HEAD FINDINGS Brain: The midline structures are normal. There is a punctate focus of diffusion restriction along the right postcentral gyrus. There is multifocal hyperintense T2-weighted signal within the periventricular white matter, most often seen in the setting of chronic microvascular ischemia. No mass lesion. No chronic microhemorrhage or cerebral amyloid angiopathy. No hydrocephalus, age advanced atrophy or lobar predominant volume loss. No dural abnormality or extra-axial collection. Skull and upper cervical spine: The visualized skull base, calvarium, upper cervical spine and extracranial soft tissues are normal. Sinuses/Orbits: No fluid levels or advanced mucosal thickening. No mastoid effusion. Normal orbits. MRA HEAD FINDINGS Intracranial  internal carotid arteries: Normal. Anterior cerebral arteries: Small medial projecting outpouching from the left anterior cerebral artery in the expected location of the anterior communicating artery does not clearly connect to the right ACA. There is also moderate narrowing at the origin of the right A1 segment. Otherwise, the anterior cerebral arteries are normal. Middle cerebral arteries: There is multifocal mild narrowing of both middle cerebral arteries. Posterior communicating arteries: Present bilaterally. Posterior cerebral arteries: Normal. Basilar artery: Normal. Vertebral arteries: Right-dominant. Limited flow related enhancement of the left vertebral artery. Superior cerebellar arteries: Normal. Anterior inferior cerebellar arteries: Normal. Posterior inferior cerebellar arteries: Normal on the right. Poorly visualized on the left. MRA NECK FINDINGS Time-of-flight angiography of the neck shows is less than 50% narrowing of the distal common carotid arteries bilaterally with wide patency of the internal carotid arteries. The right vertebral artery is dominant and normal along its entire cervical course. There is limited flow related enhancement of the diminutive left vertebral artery, which have lost along its mid cervical course. This is a new finding compared to CTA of the neck 04/20/2015. IMPRESSION: 1. Punctate acute infarct in the right postcentral gyrus. This is not in keeping with the reported right-sided symptoms and would be more expected to cause a left-sided sensory deficit. 2. No emergent large intracranial vessel occlusion. No high-grade intracranial artery stenosis. 3. Suspected occlusion of the distal V2 segment of the left vertebral artery with minimal enhancement of the distal V4 segment likely secondary to retrograde flow. CTA of the neck might be helpful to assess for a degree of residual patency as MRA may overestimate  stenoses. 4. Medially projecting outpouching from the left anterior  cerebral artery near the expected origin of the A-comm. This suspected to be an irregular A-comm or an infundibulum, but a small aneurysm would be difficult to exclude entirely. Electronically Signed   By: Ulyses Jarred M.D.   On: 06/06/2017 18:46   Mr Brain Wo Contrast (neuro Protocol)  Result Date: 06/06/2017 CLINICAL DATA:  Slurred speech and right arm weakness EXAM: MRI HEAD WITHOUT CONTRAST MRA HEAD WITHOUT CONTRAST MRA NECK WITHOUT CONTRAST TECHNIQUE: Multiplanar, multiecho pulse sequences of the brain and surrounding structures were obtained without intravenous contrast. Angiographic images of the Circle of Willis were obtained using MRA technique without intravenous contrast. Angiographic images of the neck were obtained using MRA technique without intravenous contrast. Carotid stenosis measurements (when applicable) are obtained utilizing NASCET criteria, using the distal internal carotid diameter as the denominator. COMPARISON:  Head CT 06/06/2017 FINDINGS: MRI HEAD FINDINGS Brain: The midline structures are normal. There is a punctate focus of diffusion restriction along the right postcentral gyrus. There is multifocal hyperintense T2-weighted signal within the periventricular white matter, most often seen in the setting of chronic microvascular ischemia. No mass lesion. No chronic microhemorrhage or cerebral amyloid angiopathy. No hydrocephalus, age advanced atrophy or lobar predominant volume loss. No dural abnormality or extra-axial collection. Skull and upper cervical spine: The visualized skull base, calvarium, upper cervical spine and extracranial soft tissues are normal. Sinuses/Orbits: No fluid levels or advanced mucosal thickening. No mastoid effusion. Normal orbits. MRA HEAD FINDINGS Intracranial internal carotid arteries: Normal. Anterior cerebral arteries: Small medial projecting outpouching from the left anterior cerebral artery in the expected location of the anterior communicating  artery does not clearly connect to the right ACA. There is also moderate narrowing at the origin of the right A1 segment. Otherwise, the anterior cerebral arteries are normal. Middle cerebral arteries: There is multifocal mild narrowing of both middle cerebral arteries. Posterior communicating arteries: Present bilaterally. Posterior cerebral arteries: Normal. Basilar artery: Normal. Vertebral arteries: Right-dominant. Limited flow related enhancement of the left vertebral artery. Superior cerebellar arteries: Normal. Anterior inferior cerebellar arteries: Normal. Posterior inferior cerebellar arteries: Normal on the right. Poorly visualized on the left. MRA NECK FINDINGS Time-of-flight angiography of the neck shows is less than 50% narrowing of the distal common carotid arteries bilaterally with wide patency of the internal carotid arteries. The right vertebral artery is dominant and normal along its entire cervical course. There is limited flow related enhancement of the diminutive left vertebral artery, which have lost along its mid cervical course. This is a new finding compared to CTA of the neck 04/20/2015. IMPRESSION: 1. Punctate acute infarct in the right postcentral gyrus. This is not in keeping with the reported right-sided symptoms and would be more expected to cause a left-sided sensory deficit. 2. No emergent large intracranial vessel occlusion. No high-grade intracranial artery stenosis. 3. Suspected occlusion of the distal V2 segment of the left vertebral artery with minimal enhancement of the distal V4 segment likely secondary to retrograde flow. CTA of the neck might be helpful to assess for a degree of residual patency as MRA may overestimate stenoses. 4. Medially projecting outpouching from the left anterior cerebral artery near the expected origin of the A-comm. This suspected to be an irregular A-comm or an infundibulum, but a small aneurysm would be difficult to exclude entirely. Electronically  Signed   By: Ulyses Jarred M.D.   On: 06/06/2017 18:46   Ct Head Code Stroke W/o Cm  Result  Date: 06/06/2017 CLINICAL DATA:  Code stroke. Slurred speech and visual change left eye EXAM: CT HEAD WITHOUT CONTRAST TECHNIQUE: Contiguous axial images were obtained from the base of the skull through the vertex without intravenous contrast. COMPARISON:  None. FINDINGS: Brain: No evidence of acute infarction, hemorrhage, hydrocephalus, extra-axial collection or mass lesion/mass effect. Vascular: Carotid artery calcification. Negative for hyperdense vessel Skull: Negative Sinuses/Orbits: Negative Other: None ASPECTS ([place] Stroke Program Early CT Score) - Ganglionic level infarction (caudate, lentiform nuclei, internal capsule, insula, M1-M3 cortex): 7 - Supraganglionic infarction (M4-M6 cortex): 3 Total score (0-10 with 10 being normal): 10 IMPRESSION: 1. No acute abnormality 2. ASPECTS is 10 These results were called by telephone at the time of interpretation on 06/06/2017 at 3:40 pm to Dr. Darel Hong , who verbally acknowledged these results. Electronically Signed   By: Franchot Gallo M.D.   On: 06/06/2017 15:40    Micro Results    No results found for this or any previous visit (from the past 240 hour(s)).     Today   Subjective:   Caroline [name] today has no headache,no chest abdominal pain,no new weakness tingling or numbness, feels much better wants to go home today.   Objective:   Blood pressure 135/61, pulse 79, temperature 98.4 F (36.9 C), temperature source Oral, resp. rate 16, height 5\' 2"  (1.575 m), weight 70.8 kg (156 lb), SpO2 98 %.   Intake/Output Summary (Last 24 hours) at 06/07/17 1332 Last data filed at 06/07/17 1200  Gross per 24 hour  Intake           918.75 ml  Output                0 ml  Net           918.75 ml    Exam Awake Alert, Oriented x 3, No new F.N deficits, Normal affect [place].AT,PERRAL Supple Neck,No JVD, No cervical lymphadenopathy appriciated.   Symmetrical Chest wall movement, Good air movement bilaterally, CTAB RRR,No Gallops,Rubs or new Murmurs, No Parasternal Heave +ve B.Sounds, Abd Soft, Non tender, No organomegaly appriciated, No rebound -guarding or rigidity. No Cyanosis, Clubbing or edema, No new Rash or bruise  Data Review   CBC w Diff:  Lab Results  Component Value Date   WBC 16.4 (H) 06/06/2017   HGB 13.9 06/06/2017   HGB 14.3 05/31/2017   HCT 40.7 06/06/2017   HCT 40.7 05/31/2017   PLT 316 06/06/2017   PLT 348 05/31/2017   LYMPHOPCT 32 06/06/2017   MONOPCT 9 06/06/2017   EOSPCT 2 06/06/2017   BASOPCT 1 06/06/2017    CMP:  Lab Results  Component Value Date   NA 140 06/07/2017   NA 144 05/31/2017   K 3.8 06/07/2017   CL 106 06/07/2017   CO2 25 06/07/2017   BUN 45 (H) 06/07/2017   BUN 50 (H) 05/31/2017   CREATININE 2.03 (H) 06/07/2017   PROT 7.7 06/06/2017   PROT 7.1 05/31/2017   ALBUMIN 4.4 06/06/2017   ALBUMIN 4.5 05/31/2017   BILITOT 0.7 06/06/2017   BILITOT 0.4 05/31/2017   ALKPHOS 44 06/06/2017   AST 24 06/06/2017   ALT 23 06/06/2017  .   Total Time in preparing paper work, data evaluation and todays exam - 61 minutes  Elwin Tsou M.D on 06/07/2017 at 1:32 PM    Note: This dictation was prepared with Dragon dictation along with smaller phrase technology. Any transcriptional errors that result from this process are unintentional.

## 2017-06-07 NOTE — Pre-Procedure Instructions (Signed)
SPOKE WITH HUSBAND. WIFE ADMITTED TO [facility]. NOTIFIED MICHELE AT DR [facility].

## 2017-06-07 NOTE — Progress Notes (Signed)
Pt is being discharged today. All belongings packed and returned to the patient. Discharge instructions reviewed with the patient, she verified understanding. IV removed. She will be wheeled out in a wheelchair by staff.

## 2017-06-07 NOTE — Consult Note (Signed)
Reason for Consult: R sided weakness  Referring Physician: Dr. Vianne Bulls   CC: R sided weakness   HPI: Caroline BOULTINGHOUSE is an 76 y.o. 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 Her symptoms are completely resolved eventually. CT head is negative.  MRI R postcentral gyrus.     Past Medical History:  Diagnosis Date  . Hypertension   . Hypothyroidism   . Stroke [facility])    TIA 9/17  . Thyroid disease     Past Surgical History:  Procedure Laterality Date  . ABDOMINAL HYSTERECTOMY    . CARDIAC CATHETERIZATION     Negative   . COLONOSCOPY  2010  . PARTIAL HYSTERECTOMY      Family History  Problem Relation Age of Onset  . Heart attack Father 7  . Hypertension Father   . Hyperlipidemia Father   . Heart disease Brother        valvular disease     Social History:  reports that she has never smoked. She has never used smokeless tobacco. She reports that she drinks about 0.6 oz of alcohol per week . She reports that she does not use drugs.  No Known Allergies  Medications: I have reviewed the patient's current medications.  ROS: History obtained from the patient  General ROS: negative for - chills, fatigue, fever, night sweats, weight gain or weight loss Psychological ROS: negative for - behavioral disorder, hallucinations, memory difficulties, mood swings or suicidal ideation Ophthalmic ROS: negative for - blurry vision, double vision, eye pain or loss of vision ENT ROS: negative for - epistaxis, nasal discharge, oral lesions, sore throat, tinnitus or vertigo Allergy and Immunology ROS: negative for - hives or itchy/watery eyes Hematological and Lymphatic ROS: negative for - bleeding problems, bruising or swollen lymph nodes Endocrine ROS: negative for - galactorrhea, hair pattern changes, polydipsia/polyuria or temperature intolerance Respiratory ROS:  negative for - cough, hemoptysis, shortness of breath or wheezing Cardiovascular ROS: negative for - chest pain, dyspnea on exertion, edema or irregular heartbeat Gastrointestinal ROS: negative for - abdominal pain, diarrhea, hematemesis, nausea/vomiting or stool incontinence Genito-Urinary ROS: negative for - dysuria, hematuria, incontinence or urinary frequency/urgency Musculoskeletal ROS: negative for - joint swelling or muscular weakness Neurological ROS: as noted in HPI Dermatological ROS: negative for rash and skin lesion changes  Physical Examination: Blood pressure 135/61, pulse 79, temperature 98.4 F (36.9 C), temperature source Oral, resp. rate 16, height 5\' 2"  (1.575 m), weight 70.8 kg (156 lb), SpO2 98 %.  Neurological Examination   Mental Status: Alert, oriented, thought content appropriate.  Speech fluent without evidence of aphasia.  Able to follow 3 step commands without difficulty. Cranial Nerves: II: Discs flat bilaterally; Visual fields grossly normal, pupils equal, round, reactive to light and accommodation III,IV, VI: ptosis not present, extra-ocular motions intact bilaterally V,VII: smile symmetric, facial light touch sensation normal bilaterally VIII: hearing normal bilaterally IX,X: gag reflex present XI: bilateral shoulder shrug XII: midline tongue extension Motor: Right : Upper extremity   5/5    Left:     Upper extremity   5/5  Lower extremity   5/5     Lower extremity   5/5 Tone and bulk:normal tone throughout; no atrophy noted Sensory: Pinprick and light touch intact throughout, bilaterally Deep Tendon Reflexes: 2+ and symmetric throughout Plantars: Right: downgoing   Left: downgoing Cerebellar: normal finger-to-nose, normal rapid  alternating movements and normal heel-to-shin test Gait: not tested     Laboratory Studies:   Basic Metabolic Panel:  Recent Labs Lab 05/31/17 1524 06/06/17 1542 06/07/17 0503  NA 144 135 140  K 4.0 2.8* 3.8  CL  97 95* 106  CO2 27 27 25   GLUCOSE 96 116* 94  BUN 50* 50* 45*  CREATININE 1.93* 2.18* 2.03*  CALCIUM 10.4* 10.1 9.2  MG  --   --  2.1    Liver Function Tests:  Recent Labs Lab 05/31/17 1524 06/06/17 1542  AST 18 24  ALT 21 23  ALKPHOS 53 44  BILITOT 0.4 0.7  PROT 7.1 7.7  ALBUMIN 4.5 4.4   No results for input(s): LIPASE, AMYLASE in the last 168 hours. No results for input(s): AMMONIA in the last 168 hours.  CBC:  Recent Labs Lab 05/31/17 1524 06/06/17 1542  WBC 18.2* 16.4*  NEUTROABS 12.1* 9.4*  HGB 14.3 13.9  HCT 40.7 40.7  MCV 96 96.5  PLT 348 316    Cardiac Enzymes:  Recent Labs Lab 06/06/17 1542  TROPONINI <0.03    BNP: Invalid input(s): POCBNP  CBG:  Recent Labs Lab 06/06/17 [place]    Microbiology: No results found for this or any previous visit.  Coagulation Studies:  Recent Labs  06/06/17 1542  LABPROT 13.8  INR 1.06    Urinalysis:  Recent Labs Lab 06/06/17 1644  COLORURINE STRAW*  LABSPEC 1.005  PHURINE 6.0  GLUCOSEU NEGATIVE  HGBUR NEGATIVE  BILIRUBINUR NEGATIVE  KETONESUR NEGATIVE  PROTEINUR NEGATIVE  NITRITE NEGATIVE  LEUKOCYTESUR TRACE*    Lipid Panel:     Component Value Date/Time   CHOL 281 (H) 06/07/2017 0503   TRIG 448 (H) 06/07/2017 0503   HDL 30 (L) 06/07/2017 0503   CHOLHDL 9.4 06/07/2017 0503   VLDL UNABLE TO CALCULATE IF TRIGLYCERIDE OVER 400 mg/dL 06/07/2017 0503   LDLCALC UNABLE TO CALCULATE IF TRIGLYCERIDE OVER 400 mg/dL 06/07/2017 0503    HgbA1C: No results found for: HGBA1C  Urine Drug Screen:  No results found for: LABOPIA, COCAINSCRNUR, LABBENZ, AMPHETMU, THCU, LABBARB  Alcohol Level:  Recent Labs Lab 06/06/17 [place] <5    Other results: EKG: normal EKG, normal sinus rhythm, unchanged from previous tracings.  Imaging: Mr Virgel Paling PZ Contrast  Result Date: 06/06/2017 CLINICAL DATA:  Slurred speech and right arm weakness EXAM: MRI HEAD WITHOUT CONTRAST MRA HEAD  WITHOUT CONTRAST MRA NECK WITHOUT CONTRAST TECHNIQUE: Multiplanar, multiecho pulse sequences of the brain and surrounding structures were obtained without intravenous contrast. Angiographic images of the Circle of Willis were obtained using MRA technique without intravenous contrast. Angiographic images of the neck were obtained using MRA technique without intravenous contrast. Carotid stenosis measurements (when applicable) are obtained utilizing NASCET criteria, using the distal internal carotid diameter as the denominator. COMPARISON:  Head CT 06/06/2017 FINDINGS: MRI HEAD FINDINGS Brain: The midline structures are normal. There is a punctate focus of diffusion restriction along the right postcentral gyrus. There is multifocal hyperintense T2-weighted signal within the periventricular white matter, most often seen in the setting of chronic microvascular ischemia. No mass lesion. No chronic microhemorrhage or cerebral amyloid angiopathy. No hydrocephalus, age advanced atrophy or lobar predominant volume loss. No dural abnormality or extra-axial collection. Skull and upper cervical spine: The visualized skull base, calvarium, upper cervical spine and extracranial soft tissues are normal. Sinuses/Orbits: No fluid levels or advanced mucosal thickening. No mastoid effusion. Normal orbits. MRA HEAD FINDINGS Intracranial internal  carotid arteries: Normal. Anterior cerebral arteries: Small medial projecting outpouching from the left anterior cerebral artery in the expected location of the anterior communicating artery does not clearly connect to the right ACA. There is also moderate narrowing at the origin of the right A1 segment. Otherwise, the anterior cerebral arteries are normal. Middle cerebral arteries: There is multifocal mild narrowing of both middle cerebral arteries. Posterior communicating arteries: Present bilaterally. Posterior cerebral arteries: Normal. Basilar artery: Normal. Vertebral arteries:  Right-dominant. Limited flow related enhancement of the left vertebral artery. Superior cerebellar arteries: Normal. Anterior inferior cerebellar arteries: Normal. Posterior inferior cerebellar arteries: Normal on the right. Poorly visualized on the left. MRA NECK FINDINGS Time-of-flight angiography of the neck shows is less than 50% narrowing of the distal common carotid arteries bilaterally with wide patency of the internal carotid arteries. The right vertebral artery is dominant and normal along its entire cervical course. There is limited flow related enhancement of the diminutive left vertebral artery, which have lost along its mid cervical course. This is a new finding compared to CTA of the neck 04/20/2015. IMPRESSION: 1. Punctate acute infarct in the right postcentral gyrus. This is not in keeping with the reported right-sided symptoms and would be more expected to cause a left-sided sensory deficit. 2. No emergent large intracranial vessel occlusion. No high-grade intracranial artery stenosis. 3. Suspected occlusion of the distal V2 segment of the left vertebral artery with minimal enhancement of the distal V4 segment likely secondary to retrograde flow. CTA of the neck might be helpful to assess for a degree of residual patency as MRA may overestimate stenoses. 4. Medially projecting outpouching from the left anterior cerebral artery near the expected origin of the A-comm. This suspected to be an irregular A-comm or an infundibulum, but a small aneurysm would be difficult to exclude entirely. Electronically Signed   By: Ulyses Jarred M.D.   On: 06/06/2017 18:46   Mr Jodene Nam Neck Wo Contrast  Result Date: 06/06/2017 CLINICAL DATA:  Slurred speech and right arm weakness EXAM: MRI HEAD WITHOUT CONTRAST MRA HEAD WITHOUT CONTRAST MRA NECK WITHOUT CONTRAST TECHNIQUE: Multiplanar, multiecho pulse sequences of the brain and surrounding structures were obtained without intravenous contrast. Angiographic images of  the Circle of Willis were obtained using MRA technique without intravenous contrast. Angiographic images of the neck were obtained using MRA technique without intravenous contrast. Carotid stenosis measurements (when applicable) are obtained utilizing NASCET criteria, using the distal internal carotid diameter as the denominator. COMPARISON:  Head CT 06/06/2017 FINDINGS: MRI HEAD FINDINGS Brain: The midline structures are normal. There is a punctate focus of diffusion restriction along the right postcentral gyrus. There is multifocal hyperintense T2-weighted signal within the periventricular white matter, most often seen in the setting of chronic microvascular ischemia. No mass lesion. No chronic microhemorrhage or cerebral amyloid angiopathy. No hydrocephalus, age advanced atrophy or lobar predominant volume loss. No dural abnormality or extra-axial collection. Skull and upper cervical spine: The visualized skull base, calvarium, upper cervical spine and extracranial soft tissues are normal. Sinuses/Orbits: No fluid levels or advanced mucosal thickening. No mastoid effusion. Normal orbits. MRA HEAD FINDINGS Intracranial internal carotid arteries: Normal. Anterior cerebral arteries: Small medial projecting outpouching from the left anterior cerebral artery in the expected location of the anterior communicating artery does not clearly connect to the right ACA. There is also moderate narrowing at the origin of the right A1 segment. Otherwise, the anterior cerebral arteries are normal. Middle cerebral arteries: There is multifocal mild narrowing of both middle  cerebral arteries. Posterior communicating arteries: Present bilaterally. Posterior cerebral arteries: Normal. Basilar artery: Normal. Vertebral arteries: Right-dominant. Limited flow related enhancement of the left vertebral artery. Superior cerebellar arteries: Normal. Anterior inferior cerebellar arteries: Normal. Posterior inferior cerebellar arteries: Normal  on the right. Poorly visualized on the left. MRA NECK FINDINGS Time-of-flight angiography of the neck shows is less than 50% narrowing of the distal common carotid arteries bilaterally with wide patency of the internal carotid arteries. The right vertebral artery is dominant and normal along its entire cervical course. There is limited flow related enhancement of the diminutive left vertebral artery, which have lost along its mid cervical course. This is a new finding compared to CTA of the neck 04/20/2015. IMPRESSION: 1. Punctate acute infarct in the right postcentral gyrus. This is not in keeping with the reported right-sided symptoms and would be more expected to cause a left-sided sensory deficit. 2. No emergent large intracranial vessel occlusion. No high-grade intracranial artery stenosis. 3. Suspected occlusion of the distal V2 segment of the left vertebral artery with minimal enhancement of the distal V4 segment likely secondary to retrograde flow. CTA of the neck might be helpful to assess for a degree of residual patency as MRA may overestimate stenoses. 4. Medially projecting outpouching from the left anterior cerebral artery near the expected origin of the A-comm. This suspected to be an irregular A-comm or an infundibulum, but a small aneurysm would be difficult to exclude entirely. Electronically Signed   By: Ulyses Jarred M.D.   On: 06/06/2017 18:46   Mr Brain Wo Contrast (neuro Protocol)  Result Date: 06/06/2017 CLINICAL DATA:  Slurred speech and right arm weakness EXAM: MRI HEAD WITHOUT CONTRAST MRA HEAD WITHOUT CONTRAST MRA NECK WITHOUT CONTRAST TECHNIQUE: Multiplanar, multiecho pulse sequences of the brain and surrounding structures were obtained without intravenous contrast. Angiographic images of the Circle of Willis were obtained using MRA technique without intravenous contrast. Angiographic images of the neck were obtained using MRA technique without intravenous contrast. Carotid stenosis  measurements (when applicable) are obtained utilizing NASCET criteria, using the distal internal carotid diameter as the denominator. COMPARISON:  Head CT 06/06/2017 FINDINGS: MRI HEAD FINDINGS Brain: The midline structures are normal. There is a punctate focus of diffusion restriction along the right postcentral gyrus. There is multifocal hyperintense T2-weighted signal within the periventricular white matter, most often seen in the setting of chronic microvascular ischemia. No mass lesion. No chronic microhemorrhage or cerebral amyloid angiopathy. No hydrocephalus, age advanced atrophy or lobar predominant volume loss. No dural abnormality or extra-axial collection. Skull and upper cervical spine: The visualized skull base, calvarium, upper cervical spine and extracranial soft tissues are normal. Sinuses/Orbits: No fluid levels or advanced mucosal thickening. No mastoid effusion. Normal orbits. MRA HEAD FINDINGS Intracranial internal carotid arteries: Normal. Anterior cerebral arteries: Small medial projecting outpouching from the left anterior cerebral artery in the expected location of the anterior communicating artery does not clearly connect to the right ACA. There is also moderate narrowing at the origin of the right A1 segment. Otherwise, the anterior cerebral arteries are normal. Middle cerebral arteries: There is multifocal mild narrowing of both middle cerebral arteries. Posterior communicating arteries: Present bilaterally. Posterior cerebral arteries: Normal. Basilar artery: Normal. Vertebral arteries: Right-dominant. Limited flow related enhancement of the left vertebral artery. Superior cerebellar arteries: Normal. Anterior inferior cerebellar arteries: Normal. Posterior inferior cerebellar arteries: Normal on the right. Poorly visualized on the left. MRA NECK FINDINGS Time-of-flight angiography of the neck shows is less than 50% narrowing of  the distal common carotid arteries bilaterally with wide  patency of the internal carotid arteries. The right vertebral artery is dominant and normal along its entire cervical course. There is limited flow related enhancement of the diminutive left vertebral artery, which have lost along its mid cervical course. This is a new finding compared to CTA of the neck 04/20/2015. IMPRESSION: 1. Punctate acute infarct in the right postcentral gyrus. This is not in keeping with the reported right-sided symptoms and would be more expected to cause a left-sided sensory deficit. 2. No emergent large intracranial vessel occlusion. No high-grade intracranial artery stenosis. 3. Suspected occlusion of the distal V2 segment of the left vertebral artery with minimal enhancement of the distal V4 segment likely secondary to retrograde flow. CTA of the neck might be helpful to assess for a degree of residual patency as MRA may overestimate stenoses. 4. Medially projecting outpouching from the left anterior cerebral artery near the expected origin of the A-comm. This suspected to be an irregular A-comm or an infundibulum, but a small aneurysm would be difficult to exclude entirely. Electronically Signed   By: Ulyses Jarred M.D.   On: 06/06/2017 18:46   Ct Head Code Stroke W/o Cm  Result Date: 06/06/2017 CLINICAL DATA:  Code stroke. Slurred speech and visual change left eye EXAM: CT HEAD WITHOUT CONTRAST TECHNIQUE: Contiguous axial images were obtained from the base of the skull through the vertex without intravenous contrast. COMPARISON:  None. FINDINGS: Brain: No evidence of acute infarction, hemorrhage, hydrocephalus, extra-axial collection or mass lesion/mass effect. Vascular: Carotid artery calcification. Negative for hyperdense vessel Skull: Negative Sinuses/Orbits: Negative Other: None ASPECTS ([place] Stroke Program Early CT Score) - Ganglionic level infarction (caudate, lentiform nuclei, internal capsule, insula, M1-M3 cortex): 7 - Supraganglionic infarction (M4-M6 cortex): 3 Total  score (0-10 with 10 being normal): 10 IMPRESSION: 1. No acute abnormality 2. ASPECTS is 10 These results were called by telephone at the time of interpretation on 06/06/2017 at 3:40 pm to Dr. Darel Hong , who verbally acknowledged these results. Electronically Signed   By: Franchot Gallo M.D.   On: 06/06/2017 15:40     Assessment/Plan:  76 y.o. 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 Her symptoms are completely resolved eventually. CT head is negative.  MRI R postcentral gyrus.    - Pt was not on ASA prior to admission and significant intracranial stenosis - needs to be on ASA and statin daily - has to have surgical procedure next week which is not emergent. I have asked her to postpone it at least 30 days as greatest risk of repeat stroke is within that window and pt will need to be off ASA about 5 days prior to procedure - d/c planning today - d/w family at bedside.   Leotis Pain  06/07/2017, 1:03 PM

## 2017-06-07 NOTE — Telephone Encounter (Signed)
Patient called to state she had a small stroke yesterday 06/06/17 was seen in ER. She reports the neuro department recommend to delay surgery for 5-6 weeks. She was discharged today and doing a better.

## 2017-06-07 NOTE — Evaluation (Addendum)
Occupational Therapy Evaluation Patient Details Name: Caroline [name] MRN: [id number] DOB: 17-May-1941 Today's Date: 06/07/2017    History of Present Illness 76 y.o. 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 Symptoms have resolved. 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    Clinical Impression   Pt seen for OT evaluation this date. Pt independent with all ADL, IADL, including driving at baseline. Pt presents with symptoms resolved, no acute sensory, strength, fine motor coordination, balance, executive functioning, communication, or visual deficits noted during testing. Pt noted old L shoulder injury resulting in slight L shoulder flexion difference but pt felt there was no change from baseline (4+/5, versus 5/5 everywhere else). Pt able to perform LB dressing tasks and all aspects of toileting independently at baseline level with good confidence and no LOB. No OT follow up recommended.     Follow Up Recommendations  No OT follow up    Equipment Recommendations  None recommended by OT    Recommendations for Other Services       Precautions / Restrictions Precautions Precautions: None Restrictions Weight Bearing Restrictions: No      Mobility Bed Mobility Overal bed mobility: Independent             General bed mobility comments: good confidence, no additional effort/difficulty noted  Transfers Overall transfer level: Independent Equipment used: None             General transfer comment: good confidence, safety awareness, no LOB    Balance Overall balance assessment: No apparent balance deficits (not formally assessed) (during functional mobility no LOB, good confidence)                                         ADL either performed or assessed with clinical judgement   ADL Overall ADL's : At baseline;Independent                                        General ADL Comments: pt indep with dressing and toileting tasks with no LOB noted, good confidence     Vision Baseline Vision/History: Wears glasses Wears Glasses: Distance only Patient Visual Report: Blurring of vision (pt reports initial L eye blurry vision has resolved prior to OT evaluation) Vision Assessment?: No apparent visual deficits     Perception     Praxis      Pertinent Vitals/Pain Pain Assessment: No/denies pain     Hand Dominance Right   Extremity/Trunk Assessment Upper Extremity Assessment Upper Extremity Assessment: Overall WFL for tasks assessed (sensation intact; ROM WFL bilaterally; normal RAM, thumb opposition, finger to nose testing; L shoulder flexion ~4+/5, no functional deficits, old L shoulder injury a couple years ago, pt feels this weakness is her baseline  )   Lower Extremity Assessment Lower Extremity Assessment: Overall WFL for tasks assessed   Cervical / Trunk Assessment Cervical / Trunk Assessment: Normal   Communication Communication Communication: No difficulties   Cognition Arousal/Alertness: Awake/alert Behavior During Therapy: WFL for tasks assessed/performed Overall Cognitive Status: Within Functional Limits for tasks assessed  General Comments: A&Ox4, follows commands consistently, good safety awareness, appropriate responses to situational safety/problem solving scenarios   General Comments       Exercises     Shoulder Instructions      Home Living Family/patient expects to be discharged to:: Private residence Living Arrangements: Spouse/significant other Available Help at Discharge: Family;Available 24 hours/day Type of Home: House Home Access: Stairs to enter [employer] of Steps: 3 Entrance Stairs-Rails: Left Home Layout: One level     Bathroom Shower/Tub: Tub/shower unit;Walk-in shower (pt prefers taking tub  bath)   Bathroom Toilet: Handicapped height Bathroom Accessibility: Yes How Accessible: Accessible via walker Home Equipment: None          Prior Functioning/Environment Level of Independence: Independent        Comments: Pt indep with ADL, IADL, including driving at baseline, no falls endorsed in past 12 months         OT Problem List:        OT Treatment/Interventions:      OT Goals(Current goals can be found in the care plan section) Acute Rehab OT Goals Patient Stated Goal: go home OT Goal Formulation: All assessment and education complete, DC therapy  OT Frequency:     Barriers to D/C:            Co-evaluation              AM-PAC PT "6 Clicks" Daily Activity     Outcome Measure Help from another person eating meals?: None Help from another person taking care of personal grooming?: None Help from another person toileting, which includes using toliet, bedpan, or urinal?: None Help from another person bathing (including washing, rinsing, drying)?: None Help from another person to put on and taking off regular upper body clothing?: None Help from another person to put on and taking off regular lower body clothing?: None 6 Click Score: 24   End of Session    Activity Tolerance: Patient tolerated treatment well Patient left: in bed;with call bell/phone within reach;with family/visitor present  OT Visit Diagnosis: Other symptoms and signs involving cognitive function                Time: 6837-2902 OT Time Calculation (min): 23 min Charges:  OT General Charges $OT Visit: 1 Procedure OT Evaluation $OT Eval Low Complexity: 1 Procedure G-Codes: OT G-codes **NOT FOR INPATIENT CLASS** Functional Assessment Tool Used: Clinical judgement;AM-PAC 6 Clicks Daily Activity Functional Limitation: Self care Self Care Current Status (X1155): 0 percent impaired, limited or restricted Self Care Goal Status (M0802): 0 percent impaired, limited or restricted Self Care  Discharge Status (M3361): 0 percent impaired, limited or restricted   Jeni Salles, MPH, MS, OTR/L ascom [phone] 06/07/17, 9:48 AM

## 2017-06-07 NOTE — Evaluation (Signed)
Physical Therapy Evaluation Patient Details Name: Caroline [name] MRN: [id number] DOB: 04-28-1941 Today's Date: 06/07/2017   History of Present Illness  76 y.o. 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 Symptoms have resolved. 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   Clinical Impression  Pt demonstrates steady independent functional mobility (bed mobility, transfers, and ambulation).  Pt navigates 1 step at a time using railing (pt does this baseline d/t h/o knee issues).  Pt steady with ambulation and scored 22/24 on Dynamic Gait Index indicating pt is at low risk for falls.  Currently pt appears to be at baseline level of functional mobility.  No further acute PT needs indicated (will discharge pt in house).  Please re-consult PT if pt's status changes and acute PT needs are indicated.    Follow Up Recommendations No PT follow up    Equipment Recommendations  None recommended by PT    Recommendations for Other Services       Precautions / Restrictions Precautions Precautions: None Restrictions Weight Bearing Restrictions: No      Mobility  Bed Mobility Overal bed mobility: Independent             General bed mobility comments: No difficulty noted supine to/from sit.  Transfers Overall transfer level: Independent Equipment used: None             General transfer comment: strong stand; steady without loss of balance  Ambulation/Gait Ambulation/Gait assistance: Independent Ambulation Distance (Feet): 200 Feet Assistive device: None Gait Pattern/deviations: Step-through pattern   Gait velocity interpretation: at or above normal speed for age/gender General Gait Details: steady without loss of balance  Stairs Stairs: Yes Stairs assistance: Supervision Stair Management: One rail Left;Step to pattern;Forwards Number of Stairs:  3 General stair comments: steady without loss of balance; pt reports normally taking 1 step at a time d/t h/o knee issues  Wheelchair Mobility    Modified Rankin (Stroke Patients Only)       Balance Overall balance assessment: Independent                               Standardized Balance Assessment Standardized Balance Assessment : Dynamic Gait Index   Dynamic Gait Index Level Surface: Normal Change in Gait Speed: Normal Gait with Horizontal Head Turns: Normal Gait with Vertical Head Turns: Normal Gait and Pivot Turn: Normal Step Over Obstacle: Normal Step Around Obstacles: Normal Steps: Moderate Impairment (Pt at baseline performs steps this way d/t h/o knee issues) Total Score: 22       Pertinent Vitals/Pain Pain Assessment: No/denies pain  Vitals (HR and O2 on room air) stable and WFL throughout treatment session.    Home Living Family/patient expects to be discharged to:: Private residence Living Arrangements: Spouse/significant other Available Help at Discharge: Family;Available 24 hours/day Type of Home: House Home Access: Stairs to enter Entrance Stairs-Rails: Left Entrance Stairs-Number of Steps: 3 Home Layout: One level Home Equipment: None      Prior Function Level of Independence: Independent         Comments: Pt indep with ADL, IADL, including driving at baseline, no falls endorsed in past 12 months      Hand Dominance   Dominant Hand: Right    Extremity/Trunk Assessment   Upper Extremity Assessment Upper Extremity Assessment: Defer to OT evaluation  Lower Extremity Assessment Lower Extremity Assessment: Overall WFL for tasks assessed (Normal B LE strength; intact B LE sensation (light touch), coordination (heel to shin), proprioception, and normal tone.)    Cervical / Trunk Assessment Cervical / Trunk Assessment: Normal  Communication   Communication: No difficulties  Cognition Arousal/Alertness:  Awake/alert Behavior During Therapy: WFL for tasks assessed/performed Overall Cognitive Status: Within Functional Limits for tasks assessed                                        General Comments General comments (skin integrity, edema, etc.): Pt's husband present during session.  Pt agreeable to PT session.    Exercises     Assessment/Plan    PT Assessment Patent does not need any further PT services  PT Problem List         PT Treatment Interventions      PT Goals (Current goals can be found in the Care Plan section)  Acute Rehab PT Goals Patient Stated Goal: to go home PT Goal Formulation: With patient Time For Goal Achievement: 06/21/17 Potential to Achieve Goals: Good    Frequency     Barriers to discharge        Co-evaluation               AM-PAC PT "6 Clicks" Daily Activity  Outcome Measure Difficulty turning over in bed (including adjusting bedclothes, sheets and blankets)?: None Difficulty moving from lying on back to sitting on the side of the bed? : None Difficulty sitting down on and standing up from a chair with arms (e.g., wheelchair, bedside commode, etc,.)?: None Help needed moving to and from a bed to chair (including a wheelchair)?: None Help needed walking in hospital room?: None Help needed climbing 3-5 steps with a railing? : A Little 6 Click Score: 23    End of Session Equipment Utilized During Treatment: Gait belt Activity Tolerance: Patient tolerated treatment well Patient left: in bed;with call bell/phone within reach Nurse Communication: Mobility status;Precautions PT Visit Diagnosis: Other symptoms and signs involving the nervous system (R29.898)    Time: 4008-6761 PT Time Calculation (min) (ACUTE ONLY): 15 min   Charges:   PT Evaluation $PT Eval Low Complexity: 1 Procedure     PT G CodesLeitha Bleak, PT 06/07/17, 2:00 PM [phone]

## 2017-06-08 ENCOUNTER — Telehealth: Payer: Self-pay | Admitting: *Deleted

## 2017-06-08 LAB — HEMOGLOBIN A1C
Hgb A1c MFr Bld: 5.7 % — ABNORMAL HIGH (ref 4.8–5.6)
Mean Plasma Glucose: 117 mg/dL

## 2017-06-08 NOTE — Telephone Encounter (Signed)
-----   Message from Christene Lye, MD sent at 06/08/2017  8:33 AM EDT ----- Cancel her surgery. Will see her in office in 2-3 weeks. ----- Message ----- From: Dominga Ferry, CMA Sent: 06/07/2017   8:58 AM To: Christene Lye, MD  Per Judeen Hammans with anesthesia, patient is in the hospital due to ? TIA. Do you want to go ahead and cancel surgery for Tuesday, 06-13-17?

## 2017-06-08 NOTE — Telephone Encounter (Signed)
Patient was contacted today and made aware that we have cancelled surgery that was scheduled for next week.   An office visit follow up has been arranged with Dr. Jamal Collin on 06-21-17 at 2:15 pm.

## 2017-06-13 ENCOUNTER — Ambulatory Visit: Admission: RE | Admit: 2017-06-13 | Payer: Medicare Other | Source: Ambulatory Visit | Admitting: General Surgery

## 2017-06-13 ENCOUNTER — Encounter: Admission: RE | Payer: Self-pay | Source: Ambulatory Visit

## 2017-06-13 HISTORY — DX: Hypothyroidism, unspecified: E03.9

## 2017-06-13 HISTORY — DX: Cerebral infarction, unspecified: I63.9

## 2017-06-13 SURGERY — LAPAROSCOPIC CHOLECYSTECTOMY WITH INTRAOPERATIVE CHOLANGIOGRAM
Anesthesia: Choice

## 2017-06-21 ENCOUNTER — Ambulatory Visit: Payer: Medicare Other | Admitting: General Surgery

## 2017-07-13 NOTE — Progress Notes (Signed)
Cardiology Office Note  Date:  07/14/2017   ID:  Caroline [name], Caroline [name] 17-Aug-1941, MRN [id number]  PCP:  Remi Haggard, FNP   Chief Complaint  Patient presents with  . other    New patient. [facility] for irregular heart beat. Meds reviewed verbally with patient.     HPI:  Caroline [name] is a 76 year old woman with history of  hypertension,  hyperlipidemia,  moderate Bilateral carotid disease   minimal coronary artery disease on catheterization in September 2005  Chronic renal insufficiency, creatinine of 2 Who presents by referral for CVA x2 within 6 months, arrhythmia  Notes indicate recent duplex with 40-59% ICA stenosis bilaterally old TIA on 09/24/2016,  HLD, total chol 280. She took herself off statins in the past  Recently reports having another TIA 04/06/2017, lasted 10 min presented to the ED with a chief complaint of slurred speech and right-sided weakness. Symptoms  Resolved.  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   Previously scheduled to have surgery with Dr. Jamal Collin LAPAROSCOPIC CHOLECYSTECTOMY WITH INTRAOPERATIVE CHOLANGIOGRAM This seems to have been canceled given TIA symptoms  By report she was at primary care recently and noted to have irregular rhythm  Initial EKG on today's visit showed normal sinus rhythm with short run of atrial fibrillation rate 97 bpm Repeat EKG, long-running strip showed atrial fibrillation with ventricular rate 105 bpm, to be paroxysmal  Other records reviewed with her on today's visit Prior echocardiogram January 2014 showed normal LV function with ejection fraction 69%, mild TR The report does mention at least mild to moderate regurg, mild TR  Cardiac catheterization September 2005 showing 20-30% mid left circumflex disease, ejection fraction 60%, otherwise normal study   PMH:   has a past medical history of Hypertension; Hypothyroidism; Stroke [facility]); and Thyroid disease.  PSH:     Past Surgical History:  Procedure Laterality Date  . ABDOMINAL HYSTERECTOMY    . CARDIAC CATHETERIZATION     Negative   . COLONOSCOPY  2010  . PARTIAL HYSTERECTOMY      Current Outpatient Prescriptions  Medication Sig Dispense Refill  . acetaminophen (TYLENOL) 650 MG CR tablet Take 650 mg by mouth 2 (two) times daily as needed for pain.    Marland Kitchen allopurinol (ZYLOPRIM) 100 MG tablet Take 100 mg by mouth daily.     Marland Kitchen amLODipine (NORVASC) 10 MG tablet Take 10 mg by mouth daily.    . cyanocobalamin (,VITAMIN B-12,) 1000 MCG/ML injection Inject 1,000 mcg into the muscle every 30 (thirty) days.    . furosemide (LASIX) 20 MG tablet Take 20 mg by mouth daily.    Marland Kitchen levothyroxine (SYNTHROID, LEVOTHROID) 100 MCG tablet Take 100 mcg by mouth daily before breakfast.    . lisinopril-hydrochlorothiazide (PRINZIDE,ZESTORETIC) 20-25 MG tablet     . vitamin B-12 (CYANOCOBALAMIN) 1000 MCG tablet Take 1,000 mcg by mouth daily.    Marland Kitchen apixaban (ELIQUIS) 5 MG TABS tablet Take 1 tablet (5 mg total) by mouth 2 (two) times daily. 60 tablet 6  . cloNIDine (CATAPRES) 0.1 MG tablet Take 1 tablet (0.1 mg total) by mouth 2 (two) times daily. 60 tablet 6  . metoprolol tartrate (LOPRESSOR) 25 MG tablet Take 1 tablet (25 mg total) by mouth 2 (two) times daily. 60 tablet 6  . rosuvastatin (CRESTOR) 40 MG tablet Take 1 tablet (40 mg total) by mouth daily. 90 tablet 4   No current facility-administered medications for this visit.  Allergies:   Patient has no known allergies.   Social History:  The patient  reports that she has never smoked. She has never used smokeless tobacco. She reports that she drinks about 0.6 oz of alcohol per week . She reports that she does not use drugs.   Family History:   family history includes Heart attack (age of onset: 46) in her father; Heart disease in her brother; Hyperlipidemia in her father; Hypertension in her father.    Review of Systems: Review of Systems  Constitutional:  Negative.   Respiratory: Negative.   Cardiovascular: Negative.   Gastrointestinal: Negative.   Musculoskeletal: Negative.   Neurological: Negative.        Slurred speech, right-sided weakness, resolved  Psychiatric/Behavioral: Negative.   All other systems reviewed and are negative.    PHYSICAL EXAM: VS:  BP (!) 178/85 (BP Location: Right Arm, Patient Position: Sitting, Cuff Size: Large)   Pulse 97   Ht 5\' 2"  (1.575 m)   Wt 156 lb 4 oz (70.9 kg)   BMI 28.58 kg/m  , BMI Body mass index is 28.58 kg/m. GEN: Well nourished, well developed, in no acute distress  HEENT: normal  Neck: no JVD, carotid bruits, or masses Cardiac: Irregularly irregular, no murmurs, rubs, or gallops,no edema  Respiratory:  clear to auscultation bilaterally, normal work of breathing GI: soft, nontender, nondistended, + BS MS: no deformity or atrophy  Skin: warm and dry, no rash Neuro:  Strength and sensation are intact Psych: euthymic mood, full affect    Recent Labs: 06/06/2017: ALT 23; Hemoglobin 13.9; Platelets 316 06/07/2017: BUN 45; Creatinine, Ser 2.03; Magnesium 2.1; Potassium 3.8; Sodium 140    Lipid Panel Lab Results  Component Value Date   CHOL 281 (H) 06/07/2017   HDL 30 (L) 06/07/2017   LDLCALC UNABLE TO CALCULATE IF TRIGLYCERIDE OVER 400 mg/dL 06/07/2017   TRIG 448 (H) 06/07/2017      Wt Readings from Last 3 Encounters:  07/14/17 156 lb 4 oz (70.9 kg)  06/06/17 156 lb (70.8 kg)  05/31/17 155 lb (70.3 kg)       ASSESSMENT AND PLAN:  Bilateral carotid artery stenosis - Plan: EKG 12-Lead, Cardiac event monitor Moderate disease, followed with periodic ultrasound  Coronary artery disease involving native heart without angina pectoris, unspecified vessel or lesion type - Plan: EKG 12-Lead, Cardiac event monitor Denies having any anginal symptoms No further workup at this time  Hyperlipidemia, unspecified hyperlipidemia type - Plan: EKG 12-Lead, Cardiac event  monitor Cholesterol markedly elevated Suspect simvastatin 20 mg will not be enough Recommended when she runs out of simvastatin she start Crestor 20 mg daily titrating up to 40 mg if tolerated Goal LDL less than 70  Essential hypertension - Plan: EKG 12-Lead, Cardiac event monitor Blood pressure markedly elevated on today's visit Recommended she start metoprolol as below and clonidine 0.1 mg twice a day Monitor blood pressure at home Blood pressure likely contributing to chronic renal failure  Cerebrovascular accident (CVA), unspecified mechanism ([place]) - Plan: EKG 12-Lead, Cardiac event monitor  Likely secondary to atrial fibrillation as below  Paroxysmal atrial fibrillation (HCC) Long rhythm strip obtained in the office documenting atrial fibrillation, paroxysmal We will add metoprolol  25 mg twice a day We will add anticoagulation with eliquis 5 mg twice a day, stop aspirin Event monitor has been ordered. We will try to suppress her arrhythmia She may benefit from antiarrhythmic or higher dose metoprolol if tolerated This is likely the reason for her  TIAs/strokes  Disposition:   F/U  6 weeks   Total encounter time more than 60 minutes  Greater than 50% was spent in counseling and coordination of care with the patient    Orders Placed This Encounter  Procedures  . Cardiac event monitor  . EKG 12-Lead     Signed, Esmond Plants, M.D., Ph.D. 07/14/2017  [place], [place]

## 2017-07-14 ENCOUNTER — Ambulatory Visit (INDEPENDENT_AMBULATORY_CARE_PROVIDER_SITE_OTHER): Payer: Medicare Other | Admitting: Cardiovascular Disease

## 2017-07-14 ENCOUNTER — Encounter: Payer: Self-pay | Admitting: Cardiovascular Disease

## 2017-07-14 VITALS — BP 178/85 | HR 97 | Ht 62.0 in | Wt 156.2 lb

## 2017-07-14 DIAGNOSIS — E785 Hyperlipidemia, unspecified: Secondary | ICD-10-CM | POA: Diagnosis not present

## 2017-07-14 DIAGNOSIS — I480 Paroxysmal atrial fibrillation: Secondary | ICD-10-CM | POA: Diagnosis not present

## 2017-07-14 DIAGNOSIS — I639 Cerebral infarction, unspecified: Secondary | ICD-10-CM

## 2017-07-14 DIAGNOSIS — G459 Transient cerebral ischemic attack, unspecified: Secondary | ICD-10-CM

## 2017-07-14 DIAGNOSIS — I10 Essential (primary) hypertension: Secondary | ICD-10-CM

## 2017-07-14 DIAGNOSIS — I6523 Occlusion and stenosis of bilateral carotid arteries: Secondary | ICD-10-CM

## 2017-07-14 DIAGNOSIS — I2510 Atherosclerotic heart disease of native coronary artery without angina pectoris: Secondary | ICD-10-CM

## 2017-07-14 MED ORDER — METOPROLOL TARTRATE 25 MG PO TABS: 25.0000 mg | ORAL_TABLET | Freq: Two times a day (BID) | ORAL | 6 refills | Status: DC

## 2017-07-14 MED ORDER — ROSUVASTATIN CALCIUM 40 MG PO TABS: 40.0000 mg | ORAL_TABLET | Freq: Every day | ORAL | 4 refills | Status: DC

## 2017-07-14 MED ORDER — SIMVASTATIN 40 MG PO TABS: 40.0000 mg | ORAL_TABLET | Freq: Every day | ORAL | 6 refills | Status: DC

## 2017-07-14 MED ORDER — CLONIDINE HCL 0.1 MG PO TABS: 0.1000 mg | ORAL_TABLET | Freq: Two times a day (BID) | ORAL | 6 refills | Status: DC

## 2017-07-14 MED ORDER — APIXABAN 5 MG PO TABS: 5.0000 mg | ORAL_TABLET | Freq: Two times a day (BID) | ORAL | 6 refills | Status: DC

## 2017-07-14 NOTE — Patient Instructions (Addendum)
Medication Instructions:   Please monitor blood pressure at home  We will order a 30 day monitor for TIA and palpitations  When you run out of simvastatin, Start crestor one a day  Stay on 1/2 dose lisinopril Stay on amlopdipine START Metoprolol twice a day START Clonidine twice a day START Eliquis twice a day   Medication Samples have been provided to the patient.  Drug name: Eliquis       Strength: 5 mg        Qty: 4 boxes  LOT: [id number]  Exp.Date: 12/20   Labwork:  No new labs needed  Testing/Procedures:  No further testing at this time   Follow-Up: It was a pleasure seeing you in the office today. Please call us if you have new issues that need to be addressed before your next appt.  [phone]  Your physician wants you to follow-up in: 6 weeks.  You will receive a reminder letter in the mail two months in advance. If you don't receive a letter, please call our office to schedule the follow-up appointment.  If you need a refill on your cardiac medications before your next appointment, please call your pharmacy.

## 2017-07-22 ENCOUNTER — Ambulatory Visit (INDEPENDENT_AMBULATORY_CARE_PROVIDER_SITE_OTHER): Payer: Medicare Other

## 2017-07-22 DIAGNOSIS — I2510 Atherosclerotic heart disease of native coronary artery without angina pectoris: Secondary | ICD-10-CM | POA: Diagnosis not present

## 2017-07-22 DIAGNOSIS — I6523 Occlusion and stenosis of bilateral carotid arteries: Secondary | ICD-10-CM

## 2017-07-22 DIAGNOSIS — E785 Hyperlipidemia, unspecified: Secondary | ICD-10-CM | POA: Diagnosis not present

## 2017-07-22 DIAGNOSIS — I639 Cerebral infarction, unspecified: Secondary | ICD-10-CM

## 2017-07-22 DIAGNOSIS — G459 Transient cerebral ischemic attack, unspecified: Secondary | ICD-10-CM

## 2017-07-22 DIAGNOSIS — I10 Essential (primary) hypertension: Secondary | ICD-10-CM | POA: Diagnosis not present

## 2017-07-24 ENCOUNTER — Telehealth: Payer: Self-pay | Admitting: Cardiovascular Disease

## 2017-07-24 NOTE — Telephone Encounter (Addendum)
Dr. [name] reviewed print-out and requested more readings from [employer]. Reviewed pt's [employer] readings - this is the only Event Report in her chart online.

## 2017-07-24 NOTE — Telephone Encounter (Signed)
Serious EKG showing Afib w/ RVR sustained, HR 190. Pt is at home eating and asymptomatic.  Printed for Dr. Donivan Scull review from [place].

## 2017-07-24 NOTE — Telephone Encounter (Signed)
[employer] calling in an abnormal EKG  Please advise.

## 2017-07-25 MED ORDER — DILTIAZEM HCL ER COATED BEADS 120 MG PO CP24: 120.0000 mg | ORAL_CAPSULE | Freq: Every day | ORAL | 6 refills | Status: DC

## 2017-07-25 NOTE — Telephone Encounter (Signed)
Reviewed pt's [employer] chart on line, printed additional event reports for Dr. Donivan Scull review.

## 2017-07-25 NOTE — Telephone Encounter (Signed)
Spoke w/ pt.  Advised her of Dr. Gollan's recommendation.  She is agreeable and appreciative of the call.  

## 2017-07-25 NOTE — Telephone Encounter (Signed)
I would stop the amlodipine 10 mg Start Cardizem extended release 120 mg daily Continue to wear monitor

## 2017-07-26 ENCOUNTER — Other Ambulatory Visit: Payer: Self-pay

## 2017-07-26 ENCOUNTER — Telehealth: Payer: Self-pay | Admitting: *Deleted

## 2017-07-26 MED ORDER — DILTIAZEM HCL ER COATED BEADS 120 MG PO CP24: 120.0000 mg | ORAL_CAPSULE | Freq: Every day | ORAL | 3 refills | Status: DC

## 2017-07-26 NOTE — Telephone Encounter (Signed)
Received incoming call from [employer] that patient had 15 second run of SVT with atrial runs. Last recording [employer] has is patient in sinus tachycardia at HR 105 rhythm with some PAC's.  [employer] s/w patient and she was asymptomatic at home taking a bath.  S/w patient. She denies palpitations, chest pain, dizziness or nausea. Her main complaint is fatigue. Patient was supposed to start Cardizem 120 mg daily yesterday per Dr [name].  She needed [place] to send it in the the [facility] instead so she called this morning and we have already sent in the new RX. She has not gone to pick it up yet.  Advised to go ahead and go pick it up as soon as possible as it will help her heart rate.  She verbalized understanding and will go this afternoon to get medication and start it.

## 2017-07-26 NOTE — Telephone Encounter (Signed)
EKG strips from [employer] shown to Dr [name]. He agreed with advice for patient to get Diltiazem Rx today. No further orders at this time.

## 2017-07-31 ENCOUNTER — Ambulatory Visit: Payer: Medicare Other | Admitting: General Surgery

## 2017-07-31 ENCOUNTER — Telehealth: Payer: Self-pay | Admitting: Cardiovascular Disease

## 2017-07-31 NOTE — Telephone Encounter (Signed)
Pt would like a call regarding her BP medication. States that Saturday her BP was 105/59 HR 47. She thinks her medication needs to be changes. States she was feeling as if she had no energy.

## 2017-07-31 NOTE — Telephone Encounter (Signed)
S/w patient. On Saturday, she was feeling very weak and tired.  She got BP readings of 105/59, HR 47; 112/46, HR 47; 110/65, HR 50. She called her PCP Dr Threasa Alpha who advised her to stop clonidine for the weekend then f/u with cardiology on Monday. Yesterday, 07/30/17, BP 118/52, HR49. Today, 07/31/17,      BP 138/70, HR 58. States she has more energy. She was started on Diltiazem last week for frequent PAC's via [employer] monitoring. Denies chest pain, SOB, dizziness. She is still wearing monitor. I do not see where we've received any further notifications at this time. Advised to continue medications with the exception of clonidine and monitor BP/HR and that I would route to provider here in the office today. Dr [name] is out of office. Routing to [employer], PA-C for advice.

## 2017-07-31 NOTE — Telephone Encounter (Signed)
S/w patient. We discussed recommendations. She verbalized understanding to stay off clonidine at this time. She will continue to monitor her BP and HR and decrease metoprolol to 12.5 mg twice a day if HR stays in 40's to low 50's. She will call us as well to update [place] if changes occur or BP remains >389 systolic. Patient has appt on 08/15/17 with Dr [name] and is aware.

## 2017-07-31 NOTE — Telephone Encounter (Signed)
Agree with holding of clonidine given symptoms and bradycardia noted. If she conitnues to note bradycardia into the low 50s to upper 40s bpm I would decrease her Lopressor to 12.5 mg bid vs stopping Cardizem. If her BP consistently runs > 012 systolic I would add Imdur or hydralazine. I would hold off on titration of lisinopril given CKD.

## 2017-08-14 DIAGNOSIS — I6523 Occlusion and stenosis of bilateral carotid arteries: Secondary | ICD-10-CM | POA: Insufficient documentation

## 2017-08-14 NOTE — Progress Notes (Signed)
Cardiology Office Note  Date:  08/15/2017   ID:  Caroline, [name] 01-15-1941, MRN [id number]  PCP:  Remi Haggard, FNP   Chief Complaint  Patient presents with  . other    4-6 week follow up as well as she is wearing a 30 day event monitor. Meds reviewed by the pt. verbally. "doing well."     HPI:  Caroline [name] is a 76 year old woman with history of  Paroxysmal atrial fibrillation hypertension,  hyperlipidemia,  moderate Bilateral carotid disease   minimal coronary artery disease on catheterization in September 2005  Chronic renal insufficiency, creatinine of 2 Who presents by referral for CVA x2 within 6 months, arrhythmia, paroxysmal atrial fibrillation  On her last clinic visit we noticed atrial fibrillation on EKG, started on metoprolol and anticoagulation, eliquis Given a 30 day monitor. Initially showed episodes of paroxysmal atrial fibrillation Based on this we started her on Cardizem 120 mg daily , stopped amlodipine Clonidine held for bradycardia hypotension Presents today feels well no complaints Still wearing the monitor Recent downloads reviewed with her showing normal sinus rhythm, no significant atrial fibrillation. Full download not available. She will send in the monitor for final review  Little bit of leg swelling at the end of the day, takes Lasix daily also with lisinopril HCTZ daily  Blood pressure reviewed from home showing systolic pressures typically 120 up to 140s heart rates in the 50s to 60  She is finishing her simvastatin then will change to Crestor  EKG personally reviewed by myself on todays visit Shows normal sinus rhythm rate 58 bpm no significant ST or T-wave changes  Other past medical history reviewed carotidduplex with 40-59% ICA stenosis bilaterally old TIA on 09/24/2016,  HLD, total chol 280. She took herself off statins in the past  TIA 04/06/2017, lasted 10 min presented to the ED with a chief complaint of slurred speech and  right-sided weakness. Symptoms  Resolved.  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   Previously scheduled to have surgery with Dr. Jamal Collin LAPAROSCOPIC CHOLECYSTECTOMY WITH INTRAOPERATIVE CHOLANGIOGRAM This seems to have been canceled given TIA symptoms  By report she was at primary care recently and noted to have irregular rhythm  Prior echocardiogram January 2014 showed normal LV function with ejection fraction 69%, mild TR The report does mention at least mild to moderate regurg, mild TR  Cardiac catheterization September 2005 showing 20-30% mid left circumflex disease, ejection fraction 60%, otherwise normal study   PMH:   has a past medical history of Hypertension; Hypothyroidism; Stroke [facility]); and Thyroid disease.  PSH:    Past Surgical History:  Procedure Laterality Date  . ABDOMINAL HYSTERECTOMY    . CARDIAC CATHETERIZATION     Negative   . COLONOSCOPY  2010  . PARTIAL HYSTERECTOMY      Current Outpatient Prescriptions  Medication Sig Dispense Refill  . acetaminophen (TYLENOL) 650 MG CR tablet Take 650 mg by mouth 2 (two) times daily as needed for pain.    Marland Kitchen allopurinol (ZYLOPRIM) 100 MG tablet Take 100 mg by mouth daily.     Marland Kitchen apixaban (ELIQUIS) 5 MG TABS tablet Take 1 tablet (5 mg total) by mouth 2 (two) times daily. 180 tablet 4  . cyanocobalamin (,VITAMIN B-12,) 1000 MCG/ML injection Inject 1,000 mcg into the muscle every 30 (thirty) days.    Marland Kitchen diltiazem (CARDIZEM CD) 120 MG 24 hr capsule Take 1 capsule (120 mg total) by mouth daily. 30 capsule 3  .  furosemide (LASIX) 20 MG tablet Take 20 mg by mouth daily.    Marland Kitchen levothyroxine (SYNTHROID, LEVOTHROID) 100 MCG tablet Take 100 mcg by mouth daily before breakfast.    . lisinopril-hydrochlorothiazide (PRINZIDE,ZESTORETIC) 20-25 MG tablet Take 1 tablet by mouth daily.     . metoprolol tartrate (LOPRESSOR) 25 MG tablet Take 1 tablet (25 mg total) by mouth 2 (two) times daily. 60 tablet 6  . rosuvastatin  (CRESTOR) 40 MG tablet Take 1 tablet (40 mg total) by mouth daily. (Patient not taking: Reported on 08/15/2017) 90 tablet 4   No current facility-administered medications for this visit.      Allergies:   Patient has no known allergies.   Social History:  The patient  reports that she has never smoked. She has never used smokeless tobacco. She reports that she drinks about 0.6 oz of alcohol per week . She reports that she does not use drugs.   Family History:   family history includes Heart attack (age of onset: 19) in her father; Heart disease in her brother; Hyperlipidemia in her father; Hypertension in her father.    Review of Systems: Review of Systems  Constitutional: Negative.   Respiratory: Negative.   Cardiovascular: Positive for leg swelling.  Gastrointestinal: Negative.   Musculoskeletal: Negative.   Neurological: Negative.        Slurred speech, right-sided weakness, resolved  Psychiatric/Behavioral: Negative.   All other systems reviewed and are negative.    PHYSICAL EXAM: VS:  BP (!) 152/78 (BP Location: Left Arm, Patient Position: Sitting)   Pulse (!) 58   Ht 5' 2.5" (1.588 m)   Wt 157 lb 8 oz (71.4 kg)   BMI 28.35 kg/m  , BMI Body mass index is 28.35 kg/m. GEN: Well nourished, well developed, in no acute distress  HEENT: normal  Neck: no JVD, carotid bruits, or masses Cardiac: Irregularly irregular, no murmurs, rubs, or gallops,no edema  Respiratory:  clear to auscultation bilaterally, normal work of breathing GI: soft, nontender, nondistended, + BS MS: no deformity or atrophy  Skin: warm and dry, no rash Neuro:  Strength and sensation are intact Psych: euthymic mood, full affect    Recent Labs: 06/06/2017: ALT 23; Hemoglobin 13.9; Platelets 316 06/07/2017: BUN 45; Creatinine, Ser 2.03; Magnesium 2.1; Potassium 3.8; Sodium 140    Lipid Panel Lab Results  Component Value Date   CHOL 281 (H) 06/07/2017   HDL 30 (L) 06/07/2017   LDLCALC UNABLE TO  CALCULATE IF TRIGLYCERIDE OVER 400 mg/dL 06/07/2017   TRIG 448 (H) 06/07/2017      Wt Readings from Last 3 Encounters:  08/15/17 157 lb 8 oz (71.4 kg)  07/14/17 156 lb 4 oz (70.9 kg)  06/06/17 156 lb (70.8 kg)       ASSESSMENT AND PLAN:  Bilateral carotid artery stenosis - Plan: EKG 12-Lead, Cardiac event monitor Moderate disease, followed with periodic ultrasound  Coronary artery disease involving native heart without angina pectoris, unspecified vessel or lesion type - Denies having any anginal symptoms No further workup at this time  Hyperlipidemia, unspecified hyperlipidemia type - Recommended when she runs out of simvastatin she start Crestor 20 mg daily titrating up to 40 mg if tolerated Goal LDL less than 70  Essential hypertension - Plan: EKG 12-Lead, Cardiac event monitor Blood pressure likely contributing to chronic renal failure Blood pressure stable at home  Cerebrovascular accident (CVA), unspecified mechanism ([place]) - Plan: EKG 12-Lead, Cardiac event monitor  Likely secondary to atrial fibrillation as below On  anticoagulation  Paroxysmal atrial fibrillation (HCC) Continue current medications including metoprolol 25 mg twice a day with Cardizem ER 120 daily  Disposition:   F/U  6 months   Total encounter time more than 25 minutes  Greater than 50% was spent in counseling and coordination of care with the patient    Orders Placed This Encounter  Procedures  . EKG 12-Lead     Signed, Esmond Plants, M.D., Ph.D. 08/15/2017  [place], [place]

## 2017-08-15 ENCOUNTER — Encounter: Payer: Self-pay | Admitting: Cardiovascular Disease

## 2017-08-15 ENCOUNTER — Ambulatory Visit (INDEPENDENT_AMBULATORY_CARE_PROVIDER_SITE_OTHER): Payer: Medicare Other | Admitting: Cardiovascular Disease

## 2017-08-15 VITALS — BP 152/78 | HR 58 | Ht 62.5 in | Wt 157.5 lb

## 2017-08-15 DIAGNOSIS — I10 Essential (primary) hypertension: Secondary | ICD-10-CM | POA: Diagnosis not present

## 2017-08-15 DIAGNOSIS — I2510 Atherosclerotic heart disease of native coronary artery without angina pectoris: Secondary | ICD-10-CM

## 2017-08-15 DIAGNOSIS — I480 Paroxysmal atrial fibrillation: Secondary | ICD-10-CM | POA: Diagnosis not present

## 2017-08-15 DIAGNOSIS — I639 Cerebral infarction, unspecified: Secondary | ICD-10-CM

## 2017-08-15 DIAGNOSIS — E782 Mixed hyperlipidemia: Secondary | ICD-10-CM

## 2017-08-15 DIAGNOSIS — I6523 Occlusion and stenosis of bilateral carotid arteries: Secondary | ICD-10-CM

## 2017-08-15 MED ORDER — APIXABAN 5 MG PO TABS: 5.0000 mg | ORAL_TABLET | Freq: Two times a day (BID) | ORAL | 4 refills | Status: DC

## 2017-08-15 NOTE — Patient Instructions (Signed)
Medication Instructions:   Please stay on the same meds  Labwork:  No new labs needed  Testing/Procedures:  No further testing at this time   Follow-Up: It was a pleasure seeing you in the office today. Please call us if you have new issues that need to be addressed before your next appt.  [phone]  Your physician wants you to follow-up in: 6 months.  You will receive a reminder letter in the mail two months in advance. If you don't receive a letter, please call our office to schedule the follow-up appointment.  If you need a refill on your cardiac medications before your next appointment, please call your pharmacy.

## 2017-08-21 ENCOUNTER — Telehealth: Payer: Self-pay | Admitting: Cardiovascular Disease

## 2017-08-21 NOTE — Telephone Encounter (Signed)
Received cardiac clearance request for pt to proceed w/ Left TKR @ [facility] w/ Dr. Thornton Park. DOS has not been scheduled yet.  Please route clearance to [facility] @ [phone].

## 2017-08-24 NOTE — Telephone Encounter (Signed)
Okay for knee surgery, we will need to stop the eliquis for 2 days prior to the procedure Restart anticoagulation when okay with surgery following procedure

## 2017-08-24 NOTE — Telephone Encounter (Signed)
Clearance routed to number provided.  

## 2017-08-25 ENCOUNTER — Telehealth: Payer: Self-pay | Admitting: Cardiovascular Disease

## 2017-08-25 NOTE — Telephone Encounter (Signed)
Pt states she needs clearance for her knee surgery by [facility]. Pt states [facility] informed her to send all records to her PCP 1st. Please call.

## 2017-08-25 NOTE — Telephone Encounter (Signed)
Spoke with patient and let her know that we routed clearance over to [facility] yesterday. She was appreciative for the call back and verbalized understanding with no further questions or concerns at this time.

## 2017-10-04 ENCOUNTER — Other Ambulatory Visit: Payer: Self-pay | Admitting: Orthopedic Surgery

## 2017-10-04 ENCOUNTER — Encounter
Admission: RE | Admit: 2017-10-04 | Discharge: 2017-10-04 | Disposition: A | Discharge status: Home / Self Care | Payer: Medicare Other | Source: Ambulatory Visit | Attending: Orthopedic Surgery | Admitting: Orthopedic Surgery

## 2017-10-04 DIAGNOSIS — Z01812 Encounter for preprocedural laboratory examination: Secondary | ICD-10-CM | POA: Diagnosis not present

## 2017-10-04 DIAGNOSIS — M1712 Unilateral primary osteoarthritis, left knee: Secondary | ICD-10-CM | POA: Diagnosis not present

## 2017-10-04 HISTORY — DX: Unspecified osteoarthritis, unspecified site: M19.90

## 2017-10-04 HISTORY — DX: Personal history of urinary calculi: Z87.442

## 2017-10-04 LAB — PROTIME-INR
INR: 1.5
PROTHROMBIN TIME: 18 s — AB (ref 11.4–15.2)

## 2017-10-04 LAB — COMPREHENSIVE METABOLIC PANEL
ALK PHOS: 43 U/L (ref 38–126)
ALT: 26 U/L (ref 14–54)
ANION GAP: 13 (ref 5–15)
AST: 24 U/L (ref 15–41)
Albumin: 4.2 g/dL (ref 3.5–5.0)
BILIRUBIN TOTAL: 0.8 mg/dL (ref 0.3–1.2)
BUN: 52 mg/dL — ABNORMAL HIGH (ref 6–20)
CALCIUM: 10 mg/dL (ref 8.9–10.3)
CO2: 31 mmol/L (ref 22–32)
CREATININE: 2.06 mg/dL — AB (ref 0.44–1.00)
Chloride: 96 mmol/L — ABNORMAL LOW (ref 101–111)
GFR, EST AFRICAN AMERICAN: 26 mL/min — AB (ref >60)
GFR, EST NON AFRICAN AMERICAN: 22 mL/min — AB (ref >60)
Glucose, Bld: 111 mg/dL — ABNORMAL HIGH (ref 65–99)
Potassium: 2.6 mmol/L — CL (ref 3.5–5.1)
Sodium: 140 mmol/L (ref 135–145)
TOTAL PROTEIN: 7.6 g/dL (ref 6.5–8.1)

## 2017-10-04 LAB — CBC WITH DIFFERENTIAL/PLATELET
Basophils Absolute: 0.1 10*3/uL (ref 0–0.1)
Basophils Relative: 1 %
EOS ABS: 0.2 10*3/uL (ref 0–0.7)
Eosinophils Relative: 1 %
HCT: 40.5 % (ref 35.0–47.0)
HEMOGLOBIN: 14 g/dL (ref 12.0–16.0)
LYMPHS ABS: 4.6 10*3/uL — AB (ref 1.0–3.6)
LYMPHS PCT: 32 %
MCH: 33.4 pg (ref 26.0–34.0)
MCHC: 34.5 g/dL (ref 32.0–36.0)
MCV: 96.8 fL (ref 80.0–100.0)
MONOS PCT: 12 %
Monocytes Absolute: 1.7 10*3/uL — ABNORMAL HIGH (ref 0.2–0.9)
NEUTROS PCT: 54 %
Neutro Abs: 7.7 10*3/uL — ABNORMAL HIGH (ref 1.4–6.5)
Platelets: 319 10*3/uL (ref 150–440)
RBC: 4.18 MIL/uL (ref 3.80–5.20)
RDW: 15.1 % — ABNORMAL HIGH (ref 11.5–14.5)
WBC: 14.2 10*3/uL — AB (ref 3.6–11.0)

## 2017-10-04 LAB — URINALYSIS, COMPLETE (UACMP) WITH MICROSCOPIC
BILIRUBIN URINE: NEGATIVE
GLUCOSE, UA: NEGATIVE mg/dL
HGB URINE DIPSTICK: NEGATIVE
KETONES UR: NEGATIVE mg/dL
NITRITE: NEGATIVE
PH: 6 (ref 5.0–8.0)
Protein, ur: NEGATIVE mg/dL
SPECIFIC GRAVITY, URINE: 1.006 (ref 1.005–1.030)

## 2017-10-04 LAB — SURGICAL PCR SCREEN
MRSA, PCR: NEGATIVE
STAPHYLOCOCCUS AUREUS: NEGATIVE

## 2017-10-04 LAB — TYPE AND SCREEN
ABO/RH(D): A POS
ANTIBODY SCREEN: NEGATIVE

## 2017-10-04 LAB — HEMOGLOBIN A1C
HEMOGLOBIN A1C: 6 % — AB (ref 4.8–5.6)
MEAN PLASMA GLUCOSE: 125.5 mg/dL

## 2017-10-04 LAB — APTT: aPTT: 32 seconds (ref 24–36)

## 2017-10-04 NOTE — Pre-Procedure Instructions (Signed)
Medical and cardiac clearance in front of pt's chart.  EKG in Epic 08/15/17.

## 2017-10-04 NOTE — Pre-Procedure Instructions (Signed)
Potassium 2.6 Dr Harden Mo and PCN notified.  Potassium supplement requested.

## 2017-10-04 NOTE — Patient Instructions (Signed)
Your procedure is scheduled on: Thursday Nov. 1. 2018 Report to Same Day Surgery. To find out your arrival time please call [phone] between 1PM - 3PM on Wednesday Oct. 31, 2018.  Remember: Instructions that are not followed completely may result in serious medical risk, up to and including death, or upon the discretion of your surgeon and anesthesiologist your surgery may need to be rescheduled.     _X__ 1. Do not eat food after midnight the night before your procedure.                 No gum chewing or hard candies. You may drink clear liquids up to 2 hours                 before you are scheduled to arrive for your surgery- DO not drink clear                 liquids within 2 hours of the start of your surgery.                 Clear Liquids include:  water, apple juice without pulp, clear carbohydrate                 drink such as Clearfast of Gartorade, Black Coffee or Tea (Do not add                 anything to coffee or tea).     ___ 2.  No Alcohol for 24 hours before or after surgery.   ___ 3.  Do Not Smoke or use e-cigarettes For 24 Hours Prior to Your Surgery.                 Do not use any chewable tobacco products for at least 6 hours prior to                 surgery.  ____  4.  Bring all medications with you on the day of surgery if instructed.   __x__  5.  Notify your doctor if there is any change in your medical condition      (cold, fever, infections).     Do not wear jewelry, make-up, hairpins, clips or nail polish. Do not wear lotions, powders, or perfumes. You may wear deodorant. Do not shave 48 hours prior to surgery. Men may shave face and neck. Do not bring valuables to the hospital.    [facility] is not responsible for any belongings or valuables.  Contacts, dentures or bridgework may not be worn into surgery. Leave your suitcase in the car. After surgery it may be brought to your room. For patients admitted to the hospital,  discharge time is determined by your treatment team.   Patients discharged the day of surgery will not be allowed to drive home.   Please read over the following fact sheets that you were given:   Preparing for surgery.   __x__ Take these medicines the morning of surgery with A SIP OF WATER:    1. diltiazem (CARDIZEM CD)  2. levothyroxine (SYNTHROID, LEVOTHROID)   3. metoprolol tartrate (LOPRESSOR)     ____ Fleet Enema (as directed)   __x__ Use CHG Soap as directed  ____ Use inhalers on the day of surgery  ____ Stop metformin 2 days prior to surgery    ____ Take 1/2 of usual insulin dose the night before surgery. No insulin the morning  of surgery.   __x__ Stop Eliquis per Dr. Donivan Scull instructions, ( 2 days prior to surgery)  ____ Stop Anti-inflammatories does not apply.   ____ Stop supplements until after surgery.    ____ Bring C-Pap to the hospital.

## 2017-10-05 ENCOUNTER — Encounter: Payer: Self-pay | Admitting: *Deleted

## 2017-10-05 NOTE — Pre-Procedure Instructions (Signed)
CLEARED BY DR [name] 08/24/17 AND PCP AS MOD RISK DUE TO DECREASED RENAL FUNCTION 09/14/17

## 2017-10-25 MED ORDER — CEFAZOLIN SODIUM-DEXTROSE 2-4 GM/100ML-% IV SOLN: 2.0000 g | INTRAVENOUS | Status: AC

## 2017-10-26 ENCOUNTER — Inpatient Hospital Stay: Payer: Medicare Other | Admitting: Certified Registered Nurse Anesthetist

## 2017-10-26 ENCOUNTER — Encounter
Admission: RE | Disposition: A | Discharge status: Home Health | Payer: Self-pay | Source: Ambulatory Visit | Attending: Orthopedic Surgery

## 2017-10-26 DIAGNOSIS — I739 Peripheral vascular disease, unspecified: Secondary | ICD-10-CM | POA: Diagnosis not present

## 2017-10-26 DIAGNOSIS — Z90710 Acquired absence of both cervix and uterus: Secondary | ICD-10-CM | POA: Diagnosis not present

## 2017-10-26 DIAGNOSIS — I480 Paroxysmal atrial fibrillation: Secondary | ICD-10-CM | POA: Diagnosis present

## 2017-10-26 DIAGNOSIS — I2510 Atherosclerotic heart disease of native coronary artery without angina pectoris: Secondary | ICD-10-CM | POA: Diagnosis present

## 2017-10-26 DIAGNOSIS — Z8673 Personal history of transient ischemic attack (TIA), and cerebral infarction without residual deficits: Secondary | ICD-10-CM

## 2017-10-26 DIAGNOSIS — Z79890 Hormone replacement therapy (postmenopausal): Secondary | ICD-10-CM

## 2017-10-26 DIAGNOSIS — Z87442 Personal history of urinary calculi: Secondary | ICD-10-CM | POA: Diagnosis not present

## 2017-10-26 DIAGNOSIS — Z8249 Family history of ischemic heart disease and other diseases of the circulatory system: Secondary | ICD-10-CM

## 2017-10-26 DIAGNOSIS — I130 Hypertensive heart and chronic kidney disease with heart failure and stage 1 through stage 4 chronic kidney disease, or unspecified chronic kidney disease: Secondary | ICD-10-CM | POA: Diagnosis not present

## 2017-10-26 DIAGNOSIS — D72829 Elevated white blood cell count, unspecified: Secondary | ICD-10-CM | POA: Diagnosis not present

## 2017-10-26 DIAGNOSIS — I272 Pulmonary hypertension, unspecified: Secondary | ICD-10-CM | POA: Diagnosis not present

## 2017-10-26 DIAGNOSIS — E785 Hyperlipidemia, unspecified: Secondary | ICD-10-CM | POA: Diagnosis present

## 2017-10-26 DIAGNOSIS — Z23 Encounter for immunization: Secondary | ICD-10-CM | POA: Diagnosis not present

## 2017-10-26 DIAGNOSIS — N184 Chronic kidney disease, stage 4 (severe): Secondary | ICD-10-CM | POA: Diagnosis not present

## 2017-10-26 DIAGNOSIS — M170 Bilateral primary osteoarthritis of knee: Secondary | ICD-10-CM | POA: Diagnosis not present

## 2017-10-26 DIAGNOSIS — I342 Nonrheumatic mitral (valve) stenosis: Secondary | ICD-10-CM | POA: Diagnosis not present

## 2017-10-26 DIAGNOSIS — I5033 Acute on chronic diastolic (congestive) heart failure: Secondary | ICD-10-CM | POA: Diagnosis present

## 2017-10-26 DIAGNOSIS — E039 Hypothyroidism, unspecified: Secondary | ICD-10-CM | POA: Diagnosis not present

## 2017-10-26 DIAGNOSIS — Z79899 Other long term (current) drug therapy: Secondary | ICD-10-CM

## 2017-10-26 DIAGNOSIS — E871 Hypo-osmolality and hyponatremia: Secondary | ICD-10-CM | POA: Diagnosis not present

## 2017-10-26 DIAGNOSIS — I973 Postprocedural hypertension: Secondary | ICD-10-CM | POA: Diagnosis not present

## 2017-10-26 DIAGNOSIS — D62 Acute posthemorrhagic anemia: Secondary | ICD-10-CM | POA: Diagnosis not present

## 2017-10-26 DIAGNOSIS — Z7901 Long term (current) use of anticoagulants: Secondary | ICD-10-CM | POA: Diagnosis not present

## 2017-10-26 DIAGNOSIS — R0902 Hypoxemia: Secondary | ICD-10-CM | POA: Diagnosis not present

## 2017-10-26 DIAGNOSIS — M25762 Osteophyte, left knee: Secondary | ICD-10-CM | POA: Diagnosis present

## 2017-10-26 LAB — POCT I-STAT 4, (NA,K, GLUC, HGB,HCT)
GLUCOSE: 105 mg/dL — AB (ref 65–99)
HEMATOCRIT: 40 % (ref 36.0–46.0)
Hemoglobin: 13.6 g/dL (ref 12.0–15.0)
POTASSIUM: 2.8 mmol/L — AB (ref 3.5–5.1)
Sodium: 137 mmol/L (ref 135–145)

## 2017-10-26 LAB — POTASSIUM: Potassium: 2.7 mmol/L — CL (ref 3.5–5.1)

## 2017-10-26 SURGERY — ARTHROPLASTY, KNEE, TOTAL
Anesthesia: Spinal | Laterality: Left

## 2017-10-26 MED ORDER — LACTATED RINGERS IV SOLN
INTRAVENOUS | Status: DC
  Administered 2017-10-26: 1000 mL via INTRAVENOUS
  Administered 2017-11-28: 08:00:00 via INTRAVENOUS

## 2017-10-26 MED ORDER — CEFAZOLIN SODIUM-DEXTROSE 2-4 GM/100ML-% IV SOLN
INTRAVENOUS | Status: AC
  Filled 2017-10-26: qty 100

## 2017-10-26 MED ORDER — FAMOTIDINE 20 MG PO TABS
ORAL_TABLET | ORAL | Status: AC
  Administered 2017-10-26: 20 mg via ORAL
  Filled 2017-10-26: qty 1

## 2017-10-26 MED ORDER — FAMOTIDINE 20 MG PO TABS
20.0000 mg | ORAL_TABLET | Freq: Once | ORAL | Status: AC
  Administered 2017-10-26: 20 mg via ORAL

## 2017-10-26 MED ORDER — CHLORHEXIDINE GLUCONATE CLOTH 2 % EX PADS: 6.0000 | MEDICATED_PAD | Freq: Once | CUTANEOUS | Status: DC

## 2017-10-26 SURGICAL SUPPLY — 63 items
AUTOTRANSFUS HAS 1/8 (MISCELLANEOUS)
BAG DECANTER FOR FLEXI CONT (MISCELLANEOUS) ×2 IMPLANT
BLADE SAW 1 (BLADE) ×2 IMPLANT
BLADE SAW 1/2 (BLADE) ×2 IMPLANT
CANISTER SUCT 1200ML W/VALVE (MISCELLANEOUS) ×2 IMPLANT
CANISTER SUCT 3000ML (MISCELLANEOUS) ×4 IMPLANT
CATH TRAY METER 16FR LF (MISCELLANEOUS) ×2 IMPLANT
CNTNR SPEC 2.5X3XGRAD LEK (MISCELLANEOUS) ×1
CONT SPEC 4OZ STER OR WHT (MISCELLANEOUS) ×1
CONT SPEC 4OZ STRL OR WHT (MISCELLANEOUS) ×1
CONTAINER SPEC 2.5X3XGRAD LEK (MISCELLANEOUS) ×1 IMPLANT
COOLER POLAR GLACIER W/PUMP (MISCELLANEOUS) ×2 IMPLANT
CUFF TOURN 24 STER (MISCELLANEOUS) IMPLANT
CUFF TOURN 30 STER DUAL PORT (MISCELLANEOUS) IMPLANT
DRAPE IMP U-DRAPE 54X76 (DRAPES) ×2 IMPLANT
DRAPE INCISE IOBAN 66X60 STRL (DRAPES) ×2 IMPLANT
DRAPE SHEET LG 3/4 BI-LAMINATE (DRAPES) ×4 IMPLANT
DRAPE SURG 17X11 SM STRL (DRAPES) ×4 IMPLANT
DRSG OPSITE POSTOP 4X12 (GAUZE/BANDAGES/DRESSINGS) ×2 IMPLANT
DRSG OPSITE POSTOP 4X14 (GAUZE/BANDAGES/DRESSINGS) ×2 IMPLANT
DURAPREP 26ML APPLICATOR (WOUND CARE) ×6 IMPLANT
ELECT REM PT RETURN 9FT ADLT (ELECTROSURGICAL) ×2
ELECTRODE REM PT RTRN 9FT ADLT (ELECTROSURGICAL) ×1 IMPLANT
GAUZE SPONGE 4X4 12PLY STRL (GAUZE/BANDAGES/DRESSINGS) ×2 IMPLANT
GLOVE BIOGEL PI IND STRL 9 (GLOVE) ×1 IMPLANT
GLOVE BIOGEL PI INDICATOR 9 (GLOVE) ×1
GLOVE SURG 9.0 ORTHO LTXF (GLOVE) ×4 IMPLANT
GOWN STRL REUS TWL 2XL XL LVL4 (GOWN DISPOSABLE) ×2 IMPLANT
GOWN STRL REUS W/ TWL LRG LVL3 (GOWN DISPOSABLE) ×1 IMPLANT
GOWN STRL REUS W/ TWL LRG LVL4 (GOWN DISPOSABLE) ×1 IMPLANT
GOWN STRL REUS W/TWL LRG LVL3 (GOWN DISPOSABLE) ×2
GOWN STRL REUS W/TWL LRG LVL4 (GOWN DISPOSABLE) ×2
HOLDER FOLEY CATH W/STRAP (MISCELLANEOUS) ×2 IMPLANT
IMMBOLIZER KNEE 19 BLUE UNIV (SOFTGOODS) ×2 IMPLANT
IV NS 100ML SINGLE PACK (IV SOLUTION) ×2 IMPLANT
KIT RM TURNOVER STRD PROC AR (KITS) ×2 IMPLANT
NDL SAFETY 18GX1.5 (NEEDLE) ×2 IMPLANT
NDL SAFETY 22GX1.5 (NEEDLE) ×2 IMPLANT
NDL SPNL 20GX3.5 QUINCKE YW (NEEDLE) ×1 IMPLANT
NEEDLE SPNL 20GX3.5 QUINCKE YW (NEEDLE) ×2 IMPLANT
NS IRRIG 1000ML POUR BTL (IV SOLUTION) ×2 IMPLANT
PACK TOTAL KNEE (MISCELLANEOUS) ×2 IMPLANT
PAD PREP 24X41 OB/GYN DISP (PERSONAL CARE ITEMS) ×2 IMPLANT
PAD WRAPON POLAR KNEE (MISCELLANEOUS) ×1 IMPLANT
PULSAVAC PLUS IRRIG FAN TIP (DISPOSABLE) ×2
SOL .9 NS 3000ML IRR  AL (IV SOLUTION) ×1
SOL .9 NS 3000ML IRR AL (IV SOLUTION) ×1
SOL .9 NS 3000ML IRR UROMATIC (IV SOLUTION) ×1 IMPLANT
SPONGE DRAIN TRACH 4X4 STRL 2S (GAUZE/BANDAGES/DRESSINGS) ×2 IMPLANT
SPONGE LAP 18X18 5 PK (GAUZE/BANDAGES/DRESSINGS) IMPLANT
STAPLER SKIN PROX 35W (STAPLE) ×2 IMPLANT
SUCTION FRAZIER HANDLE 10FR (MISCELLANEOUS) ×1
SUCTION TUBE FRAZIER 10FR DISP (MISCELLANEOUS) ×1 IMPLANT
SUT ETHIBOND NAB CT1 #1 30IN (SUTURE) ×4 IMPLANT
SUT VIC AB 0 CT1 36 (SUTURE) ×2 IMPLANT
SUT VIC AB 2-0 CT1 (SUTURE) ×4 IMPLANT
SYR 20CC LL (SYRINGE) ×2 IMPLANT
SYR 30ML LL (SYRINGE) ×4 IMPLANT
SYSTEM AUTOTRANSFUS DUAL TROCR (MISCELLANEOUS) IMPLANT
TIP FAN IRRIG PULSAVAC PLUS (DISPOSABLE) ×1 IMPLANT
TOWER CARTRIDGE SMART MIX (DISPOSABLE) ×2 IMPLANT
TUBE SUCT KAM VAC (TUBING) ×2 IMPLANT
WRAPON POLAR PAD KNEE (MISCELLANEOUS) ×2

## 2017-10-26 NOTE — Anesthesia Preprocedure Evaluation (Deleted)
Anesthesia Evaluation  Patient identified by MRN, date of birth, ID band Patient awake    Reviewed: Allergy & Precautions, H&P , NPO status , Patient's Chart, lab work & pertinent test results, reviewed documented beta blocker date and time   Airway Mallampati: II   Neck ROM: full    Dental  (+) Teeth Intact   Pulmonary neg pulmonary ROS,    Pulmonary exam normal        Cardiovascular Exercise Tolerance: Poor hypertension, On Medications + CAD and + Peripheral Vascular Disease  negative cardio ROS Normal cardiovascular examAtrial Fibrillation  Rhythm:regular Rate:Normal     Neuro/Psych TIACVA, No Residual Symptoms negative neurological ROS  negative psych ROS   GI/Hepatic negative GI ROS, Neg liver ROS,   Endo/Other  negative endocrine ROSHypothyroidism   Renal/GU Renal diseasenegative Renal ROS  negative genitourinary   Musculoskeletal   Abdominal   Peds  Hematology negative hematology ROS (+)   Anesthesia Other Findings Past Medical History: No date: Arthritis No date: Chronic kidney disease     Comment:  STAGE 4 01/2017: History of kidney stones No date: Hypertension No date: Hypothyroidism No date: Stroke [facility])     Comment:  TIA 9/17 & 04/06/2017 No date: Thyroid disease Past Surgical History: No date: ABDOMINAL HYSTERECTOMY No date: CARDIAC CATHETERIZATION     Comment:  Negative  2010: COLONOSCOPY No date: PARTIAL HYSTERECTOMY BMI    Body Mass Index:  27.90 kg/m     Reproductive/Obstetrics negative OB ROS                             Anesthesia Physical Anesthesia Plan  ASA: III  Anesthesia Plan: Spinal   Post-op Pain Management:    Induction:   PONV Risk Score and Plan: 4 or greater and Ondansetron, Dexamethasone, Midazolam and Propofol infusion  Airway Management Planned:   Additional Equipment:   Intra-op Plan:   Post-operative Plan:   Informed  Consent: I have reviewed the patients History and Physical, chart, labs and discussed the procedure including the risks, benefits and alternatives for the proposed anesthesia with the patient or authorized representative who has indicated his/her understanding and acceptance.   Dental Advisory Given  Plan Discussed with: CRNA  Anesthesia Plan Comments:         Anesthesia Quick Evaluation

## 2017-10-31 ENCOUNTER — Telehealth: Payer: Self-pay | Admitting: Cardiovascular Disease

## 2017-10-31 NOTE — Telephone Encounter (Signed)
Spoke with patient and she had procedure last week and where the IV was at she has a huge bruise. When she went in to get her Eliquis the pharmacist recommended that she call and make [place] aware. She states that this area has not gotten bigger in size but just looks bad. Advised that unfortunately she is much more susceptible to bruising. Recommended that she try wrapping a ace bandage around that area to protect it and provide some mild compression. Instructed her to call back tomorrow to schedule her follow up visit in January with Dr. [name] and if she should continue to have problems or if it worsens to call back. She verbalized understanding, agreement with plan, and had no further questions at this time.

## 2017-10-31 NOTE — Telephone Encounter (Signed)
Pt states Eliquis is causing bruising. Please call.

## 2017-11-01 NOTE — Telephone Encounter (Signed)
She has had more than one stroke so we need anticoagulation Certainly could do a nurse visit to evaluate the bruising if needed

## 2017-11-02 NOTE — Telephone Encounter (Signed)
Spoke with patient and reviewed Dr. Donivan Scull recommendations and she reports that the bruised area is starting to look better and getting better. Reviewed reasons for continuing anticoagulation and advised her to call back if bruising should continue to be a problem. She verbalized understanding of our conversation and had no further questions at this time.

## 2017-11-09 ENCOUNTER — Other Ambulatory Visit: Payer: Self-pay | Admitting: Orthopedic Surgery

## 2017-11-10 ENCOUNTER — Ambulatory Visit (INDEPENDENT_AMBULATORY_CARE_PROVIDER_SITE_OTHER): Payer: Medicare Other | Admitting: Vascular Surgery

## 2017-11-10 ENCOUNTER — Encounter (INDEPENDENT_AMBULATORY_CARE_PROVIDER_SITE_OTHER): Payer: Medicare Other

## 2017-11-27 ENCOUNTER — Other Ambulatory Visit: Payer: Self-pay | Admitting: Cardiovascular Disease

## 2017-11-27 MED ORDER — CLINDAMYCIN PHOSPHATE 600 MG/50ML IV SOLN: 600.0000 mg | Freq: Once | INTRAVENOUS | Status: DC

## 2017-11-27 MED ORDER — CEFAZOLIN SODIUM-DEXTROSE 2-4 GM/100ML-% IV SOLN: 2.0000 g | INTRAVENOUS | Status: DC

## 2017-11-27 NOTE — Addendum Note (Signed)
Encounter addended by: Lajean Silvius, RN on: 11/27/2017 10:47 AM  Actions taken: Charge Capture section accepted

## 2017-11-28 ENCOUNTER — Inpatient Hospital Stay: Payer: Medicare Other

## 2017-11-28 ENCOUNTER — Ambulatory Visit: Admission: RE | Admit: 2017-11-28 | Payer: Medicare Other | Source: Ambulatory Visit | Admitting: Orthopedic Surgery

## 2017-11-28 ENCOUNTER — Inpatient Hospital Stay: Payer: Medicare Other | Admitting: Anesthesiology

## 2017-11-28 ENCOUNTER — Encounter
Admission: RE | Disposition: A | Discharge status: Home Health | Payer: Self-pay | Source: Ambulatory Visit | Attending: Orthopedic Surgery

## 2017-11-28 ENCOUNTER — Encounter: Payer: Self-pay | Admitting: *Deleted

## 2017-11-28 ENCOUNTER — Inpatient Hospital Stay
Admission: RE | Admit: 2017-11-28 | Discharge: 2017-12-01 | DRG: 469 | Disposition: A | Discharge status: Home Health | Payer: Medicare Other | Source: Ambulatory Visit | Attending: Orthopedic Surgery | Admitting: Orthopedic Surgery

## 2017-11-28 ENCOUNTER — Other Ambulatory Visit: Payer: Self-pay

## 2017-11-28 DIAGNOSIS — I342 Nonrheumatic mitral (valve) stenosis: Secondary | ICD-10-CM | POA: Diagnosis present

## 2017-11-28 DIAGNOSIS — I2510 Atherosclerotic heart disease of native coronary artery without angina pectoris: Secondary | ICD-10-CM | POA: Diagnosis present

## 2017-11-28 DIAGNOSIS — E039 Hypothyroidism, unspecified: Secondary | ICD-10-CM | POA: Diagnosis present

## 2017-11-28 DIAGNOSIS — D72829 Elevated white blood cell count, unspecified: Secondary | ICD-10-CM | POA: Diagnosis not present

## 2017-11-28 DIAGNOSIS — I130 Hypertensive heart and chronic kidney disease with heart failure and stage 1 through stage 4 chronic kidney disease, or unspecified chronic kidney disease: Secondary | ICD-10-CM | POA: Diagnosis present

## 2017-11-28 DIAGNOSIS — R0902 Hypoxemia: Secondary | ICD-10-CM | POA: Diagnosis present

## 2017-11-28 DIAGNOSIS — Z23 Encounter for immunization: Secondary | ICD-10-CM | POA: Diagnosis present

## 2017-11-28 DIAGNOSIS — E785 Hyperlipidemia, unspecified: Secondary | ICD-10-CM | POA: Diagnosis present

## 2017-11-28 DIAGNOSIS — I5032 Chronic diastolic (congestive) heart failure: Secondary | ICD-10-CM | POA: Diagnosis not present

## 2017-11-28 DIAGNOSIS — Z96652 Presence of left artificial knee joint: Secondary | ICD-10-CM

## 2017-11-28 DIAGNOSIS — M170 Bilateral primary osteoarthritis of knee: Secondary | ICD-10-CM | POA: Diagnosis present

## 2017-11-28 DIAGNOSIS — D62 Acute posthemorrhagic anemia: Secondary | ICD-10-CM | POA: Diagnosis not present

## 2017-11-28 DIAGNOSIS — R0602 Shortness of breath: Secondary | ICD-10-CM | POA: Diagnosis not present

## 2017-11-28 DIAGNOSIS — I5033 Acute on chronic diastolic (congestive) heart failure: Secondary | ICD-10-CM | POA: Diagnosis not present

## 2017-11-28 DIAGNOSIS — M25762 Osteophyte, left knee: Secondary | ICD-10-CM | POA: Diagnosis present

## 2017-11-28 DIAGNOSIS — J810 Acute pulmonary edema: Secondary | ICD-10-CM | POA: Diagnosis not present

## 2017-11-28 DIAGNOSIS — I272 Pulmonary hypertension, unspecified: Secondary | ICD-10-CM | POA: Diagnosis present

## 2017-11-28 DIAGNOSIS — N184 Chronic kidney disease, stage 4 (severe): Secondary | ICD-10-CM | POA: Diagnosis present

## 2017-11-28 DIAGNOSIS — E871 Hypo-osmolality and hyponatremia: Secondary | ICD-10-CM | POA: Diagnosis present

## 2017-11-28 DIAGNOSIS — I5031 Acute diastolic (congestive) heart failure: Secondary | ICD-10-CM | POA: Diagnosis not present

## 2017-11-28 DIAGNOSIS — I739 Peripheral vascular disease, unspecified: Secondary | ICD-10-CM | POA: Diagnosis present

## 2017-11-28 DIAGNOSIS — I480 Paroxysmal atrial fibrillation: Secondary | ICD-10-CM | POA: Diagnosis not present

## 2017-11-28 DIAGNOSIS — I973 Postprocedural hypertension: Secondary | ICD-10-CM | POA: Diagnosis present

## 2017-11-28 HISTORY — PX: TOTAL KNEE ARTHROPLASTY: SHX125

## 2017-11-28 HISTORY — DX: Chronic diastolic (congestive) heart failure: I50.32

## 2017-11-28 HISTORY — DX: Paroxysmal atrial fibrillation: I48.0

## 2017-11-28 LAB — POCT I-STAT 4, (NA,K, GLUC, HGB,HCT)
Glucose, Bld: 117 mg/dL — ABNORMAL HIGH (ref 65–99)
HCT: 36 % (ref 36.0–46.0)
Hemoglobin: 12.2 g/dL (ref 12.0–15.0)
POTASSIUM: 3.6 mmol/L (ref 3.5–5.1)
SODIUM: 140 mmol/L (ref 135–145)

## 2017-11-28 SURGERY — ARTHROPLASTY, KNEE, TOTAL
Anesthesia: Spinal | Site: Knee | Laterality: Left | Wound class: Clean

## 2017-11-28 MED ORDER — SENNOSIDES-DOCUSATE SODIUM 8.6-50 MG PO TABS: 1.0000 | ORAL_TABLET | Freq: Every evening | ORAL | Status: DC | PRN

## 2017-11-28 MED ORDER — MENTHOL 3 MG MT LOZG
1.0000 | LOZENGE | OROMUCOSAL | Status: DC | PRN
  Filled 2017-11-28: qty 9

## 2017-11-28 MED ORDER — ALLOPURINOL 100 MG PO TABS
100.0000 mg | ORAL_TABLET | Freq: Every day | ORAL | Status: DC
  Administered 2017-11-29 – 2017-12-01 (×3): 100 mg via ORAL
  Filled 2017-11-28 (×4): qty 1

## 2017-11-28 MED ORDER — LACTATED RINGERS IV SOLN
INTRAVENOUS | Status: DC
  Administered 2017-11-28: 08:00:00 via INTRAVENOUS

## 2017-11-28 MED ORDER — ONDANSETRON HCL 4 MG PO TABS: 4.0000 mg | ORAL_TABLET | Freq: Four times a day (QID) | ORAL | Status: DC | PRN

## 2017-11-28 MED ORDER — PROPOFOL 10 MG/ML IV BOLUS
INTRAVENOUS | Status: DC | PRN
  Administered 2017-11-28: 20 mg via INTRAVENOUS
  Administered 2017-11-28: 40 mg via INTRAVENOUS

## 2017-11-28 MED ORDER — METOPROLOL TARTRATE 25 MG PO TABS
25.0000 mg | ORAL_TABLET | Freq: Two times a day (BID) | ORAL | Status: DC
  Administered 2017-11-28 – 2017-12-01 (×6): 25 mg via ORAL
  Filled 2017-11-28 (×6): qty 1

## 2017-11-28 MED ORDER — LISINOPRIL 20 MG PO TABS
40.0000 mg | ORAL_TABLET | Freq: Every day | ORAL | Status: DC
  Administered 2017-11-29 – 2017-12-01 (×3): 40 mg via ORAL
  Filled 2017-11-28 (×3): qty 2

## 2017-11-28 MED ORDER — PNEUMOCOCCAL VAC POLYVALENT 25 MCG/0.5ML IJ INJ
0.5000 mL | INJECTION | INTRAMUSCULAR | Status: AC
  Administered 2017-11-29: 0.5 mL via INTRAMUSCULAR
  Filled 2017-11-28: qty 0.5

## 2017-11-28 MED ORDER — BUPIVACAINE LIPOSOME 1.3 % IJ SUSP
INTRAMUSCULAR | Status: AC
  Filled 2017-11-28: qty 20

## 2017-11-28 MED ORDER — BUPIVACAINE HCL (PF) 0.5 % IJ SOLN
INTRAMUSCULAR | Status: AC
  Filled 2017-11-28: qty 10

## 2017-11-28 MED ORDER — CHLORHEXIDINE GLUCONATE CLOTH 2 % EX PADS: 6.0000 | MEDICATED_PAD | Freq: Once | CUTANEOUS | Status: DC

## 2017-11-28 MED ORDER — HYDROCHLOROTHIAZIDE 25 MG PO TABS
25.0000 mg | ORAL_TABLET | Freq: Every day | ORAL | Status: DC
  Administered 2017-11-28: 25 mg via ORAL
  Filled 2017-11-28: qty 1

## 2017-11-28 MED ORDER — LISINOPRIL-HYDROCHLOROTHIAZIDE 20-25 MG PO TABS: 1.0000 | ORAL_TABLET | Freq: Every day | ORAL | Status: DC

## 2017-11-28 MED ORDER — POTASSIUM CHLORIDE CRYS ER 10 MEQ PO TBCR
10.0000 meq | EXTENDED_RELEASE_TABLET | Freq: Once | ORAL | Status: AC
  Administered 2017-11-28: 10 meq via ORAL
  Filled 2017-11-28: qty 1

## 2017-11-28 MED ORDER — APIXABAN 5 MG PO TABS
5.0000 mg | ORAL_TABLET | Freq: Two times a day (BID) | ORAL | Status: DC
  Administered 2017-11-30 – 2017-12-01 (×3): 5 mg via ORAL
  Filled 2017-11-28 (×3): qty 1

## 2017-11-28 MED ORDER — CYANOCOBALAMIN 1000 MCG/ML IJ SOLN: 1000.0000 ug | INTRAMUSCULAR | Status: DC

## 2017-11-28 MED ORDER — SODIUM CHLORIDE FLUSH 0.9 % IV SOLN
INTRAVENOUS | Status: AC
  Filled 2017-11-28: qty 3

## 2017-11-28 MED ORDER — CELECOXIB 200 MG PO CAPS
200.0000 mg | ORAL_CAPSULE | Freq: Two times a day (BID) | ORAL | Status: DC
  Administered 2017-11-28 – 2017-12-01 (×6): 200 mg via ORAL
  Filled 2017-11-28 (×6): qty 1

## 2017-11-28 MED ORDER — METHOCARBAMOL 500 MG PO TABS
500.0000 mg | ORAL_TABLET | Freq: Four times a day (QID) | ORAL | Status: DC | PRN
  Administered 2017-11-29 – 2017-11-30 (×2): 500 mg via ORAL
  Filled 2017-11-28 (×2): qty 1

## 2017-11-28 MED ORDER — FAMOTIDINE 20 MG PO TABS
ORAL_TABLET | ORAL | Status: AC
  Administered 2017-11-28: 20 mg via ORAL
  Filled 2017-11-28: qty 1

## 2017-11-28 MED ORDER — BUPIVACAINE HCL (PF) 0.5 % IJ SOLN
INTRAMUSCULAR | Status: DC | PRN
  Administered 2017-11-28: 3 mL

## 2017-11-28 MED ORDER — LEVOTHYROXINE SODIUM 112 MCG PO TABS
112.0000 ug | ORAL_TABLET | Freq: Every day | ORAL | Status: DC
  Administered 2017-11-29 – 2017-12-01 (×3): 112 ug via ORAL
  Filled 2017-11-28 (×3): qty 1

## 2017-11-28 MED ORDER — FENTANYL CITRATE (PF) 100 MCG/2ML IJ SOLN
INTRAMUSCULAR | Status: DC | PRN
  Administered 2017-11-28 (×2): 25 ug via INTRAVENOUS
  Administered 2017-11-28: 50 ug via INTRAVENOUS

## 2017-11-28 MED ORDER — SODIUM CHLORIDE 0.9 % IV SOLN
INTRAVENOUS | Status: DC
  Administered 2017-11-28 – 2017-11-29 (×2): via INTRAVENOUS

## 2017-11-28 MED ORDER — MORPHINE SULFATE 4 MG/ML IJ SOLN
INTRAMUSCULAR | Status: DC | PRN
  Administered 2017-11-28: 62 mL via INTRA_ARTICULAR

## 2017-11-28 MED ORDER — OXYCODONE HCL 5 MG PO TABS
5.0000 mg | ORAL_TABLET | ORAL | Status: DC | PRN
  Administered 2017-11-28 – 2017-12-01 (×7): 5 mg via ORAL
  Filled 2017-11-28 (×7): qty 1

## 2017-11-28 MED ORDER — NEOMYCIN-POLYMYXIN B GU 40-200000 IR SOLN
Status: AC
  Filled 2017-11-28: qty 20

## 2017-11-28 MED ORDER — DEXTROSE 5 % IV SOLN
1.0000 g | Freq: Four times a day (QID) | INTRAVENOUS | Status: AC
  Administered 2017-11-28 (×2): 1 g via INTRAVENOUS
  Filled 2017-11-28 (×2): qty 10

## 2017-11-28 MED ORDER — ACETAMINOPHEN 325 MG PO TABS
650.0000 mg | ORAL_TABLET | ORAL | Status: DC | PRN
Start: 2017-11-28 — End: 2017-12-01
  Administered 2017-11-28: 650 mg via ORAL
  Filled 2017-11-28: qty 2

## 2017-11-28 MED ORDER — KETOROLAC TROMETHAMINE 30 MG/ML IJ SOLN
INTRAMUSCULAR | Status: AC
  Filled 2017-11-28: qty 1

## 2017-11-28 MED ORDER — PROPOFOL 500 MG/50ML IV EMUL
INTRAVENOUS | Status: DC | PRN
  Administered 2017-11-28: 25 ug/kg/min via INTRAVENOUS

## 2017-11-28 MED ORDER — SIMVASTATIN 20 MG PO TABS
20.0000 mg | ORAL_TABLET | Freq: Every day | ORAL | Status: DC
  Administered 2017-11-28 – 2017-11-30 (×3): 20 mg via ORAL
  Filled 2017-11-28 (×3): qty 1

## 2017-11-28 MED ORDER — SUCCINYLCHOLINE CHLORIDE 20 MG/ML IJ SOLN
INTRAMUSCULAR | Status: AC
  Filled 2017-11-28: qty 1

## 2017-11-28 MED ORDER — DEXTROSE 5 % IV SOLN
500.0000 mg | Freq: Four times a day (QID) | INTRAVENOUS | Status: DC | PRN
  Filled 2017-11-28: qty 5

## 2017-11-28 MED ORDER — ONDANSETRON HCL 4 MG/2ML IJ SOLN: 4.0000 mg | Freq: Four times a day (QID) | INTRAMUSCULAR | Status: DC | PRN

## 2017-11-28 MED ORDER — HYDROCHLOROTHIAZIDE 25 MG PO TABS
25.0000 mg | ORAL_TABLET | Freq: Every day | ORAL | Status: DC
  Administered 2017-11-29: 25 mg via ORAL
  Filled 2017-11-28: qty 1

## 2017-11-28 MED ORDER — OXYCODONE HCL 5 MG PO TABS
10.0000 mg | ORAL_TABLET | ORAL | Status: DC | PRN
  Administered 2017-11-28 – 2017-12-01 (×13): 10 mg via ORAL
  Filled 2017-11-28 (×14): qty 2

## 2017-11-28 MED ORDER — PROPOFOL 10 MG/ML IV BOLUS
INTRAVENOUS | Status: AC
  Filled 2017-11-28: qty 20

## 2017-11-28 MED ORDER — FENTANYL CITRATE (PF) 100 MCG/2ML IJ SOLN
INTRAMUSCULAR | Status: AC
  Filled 2017-11-28: qty 2

## 2017-11-28 MED ORDER — POTASSIUM CHLORIDE CRYS ER 10 MEQ PO TBCR
10.0000 meq | EXTENDED_RELEASE_TABLET | Freq: Two times a day (BID) | ORAL | Status: DC
  Administered 2017-11-28 – 2017-12-01 (×6): 10 meq via ORAL
  Filled 2017-11-28 (×6): qty 1

## 2017-11-28 MED ORDER — NEOMYCIN-POLYMYXIN B GU 40-200000 IR SOLN
Status: DC | PRN
  Administered 2017-11-28: 12 mL
  Administered 2017-11-28: 4 mL

## 2017-11-28 MED ORDER — DOCUSATE SODIUM 100 MG PO CAPS
100.0000 mg | ORAL_CAPSULE | Freq: Two times a day (BID) | ORAL | Status: DC
  Administered 2017-11-28 – 2017-12-01 (×6): 100 mg via ORAL
  Filled 2017-11-28 (×6): qty 1

## 2017-11-28 MED ORDER — LISINOPRIL 20 MG PO TABS
20.0000 mg | ORAL_TABLET | Freq: Every day | ORAL | Status: DC
  Administered 2017-11-28: 20 mg via ORAL
  Filled 2017-11-28: qty 1

## 2017-11-28 MED ORDER — LISINOPRIL 20 MG PO TABS
20.0000 mg | ORAL_TABLET | Freq: Once | ORAL | Status: AC
  Administered 2017-11-28: 20 mg via ORAL
  Filled 2017-11-28: qty 1

## 2017-11-28 MED ORDER — BISACODYL 10 MG RE SUPP
10.0000 mg | Freq: Every day | RECTAL | Status: DC | PRN
Start: 2017-11-28 — End: 2017-12-01
  Administered 2017-11-30: 10 mg via RECTAL
  Filled 2017-11-28: qty 1

## 2017-11-28 MED ORDER — FLEET ENEMA 7-19 GM/118ML RE ENEM: 1.0000 | ENEMA | Freq: Once | RECTAL | Status: DC | PRN

## 2017-11-28 MED ORDER — CLINDAMYCIN PHOSPHATE 600 MG/50ML IV SOLN
INTRAVENOUS | Status: AC
  Filled 2017-11-28: qty 50

## 2017-11-28 MED ORDER — CEFAZOLIN SODIUM-DEXTROSE 2-3 GM-%(50ML) IV SOLR
INTRAVENOUS | Status: DC | PRN
  Administered 2017-11-28: 2 g via INTRAVENOUS

## 2017-11-28 MED ORDER — CEFAZOLIN SODIUM-DEXTROSE 2-4 GM/100ML-% IV SOLN
INTRAVENOUS | Status: AC
  Filled 2017-11-28: qty 100

## 2017-11-28 MED ORDER — CLINDAMYCIN PHOSPHATE 600 MG/50ML IV SOLN
INTRAVENOUS | Status: DC | PRN
  Administered 2017-11-28: 600 mg via INTRAVENOUS

## 2017-11-28 MED ORDER — CEFAZOLIN SODIUM-DEXTROSE 1-4 GM/50ML-% IV SOLN
1.0000 g | Freq: Four times a day (QID) | INTRAVENOUS | Status: DC
Start: 2017-11-28 — End: 2017-11-28

## 2017-11-28 MED ORDER — FUROSEMIDE 20 MG PO TABS
20.0000 mg | ORAL_TABLET | Freq: Every day | ORAL | Status: DC
  Administered 2017-11-28 – 2017-11-29 (×2): 20 mg via ORAL
  Filled 2017-11-28 (×2): qty 1

## 2017-11-28 MED ORDER — ACETAMINOPHEN 650 MG RE SUPP: 650.0000 mg | RECTAL | Status: DC | PRN

## 2017-11-28 MED ORDER — ONDANSETRON HCL 4 MG/2ML IJ SOLN: 4.0000 mg | Freq: Once | INTRAMUSCULAR | Status: DC | PRN

## 2017-11-28 MED ORDER — FAMOTIDINE 20 MG PO TABS
20.0000 mg | ORAL_TABLET | Freq: Once | ORAL | Status: AC
  Administered 2017-11-28: 20 mg via ORAL

## 2017-11-28 MED ORDER — SODIUM CHLORIDE 0.9 % IV SOLN
INTRAVENOUS | Status: DC | PRN
  Administered 2017-11-28: 120 mL

## 2017-11-28 MED ORDER — GABAPENTIN 300 MG PO CAPS
300.0000 mg | ORAL_CAPSULE | Freq: Three times a day (TID) | ORAL | Status: DC
  Administered 2017-11-28 – 2017-12-01 (×9): 300 mg via ORAL
  Filled 2017-11-28 (×9): qty 1

## 2017-11-28 MED ORDER — BUPIVACAINE-EPINEPHRINE (PF) 0.25% -1:200000 IJ SOLN
INTRAMUSCULAR | Status: AC
  Filled 2017-11-28: qty 60

## 2017-11-28 MED ORDER — EPHEDRINE SULFATE 50 MG/ML IJ SOLN
INTRAMUSCULAR | Status: AC
Start: 2017-11-28 — End: ?
  Filled 2017-11-28: qty 1

## 2017-11-28 MED ORDER — FENTANYL CITRATE (PF) 100 MCG/2ML IJ SOLN: 25.0000 ug | INTRAMUSCULAR | Status: DC | PRN

## 2017-11-28 MED ORDER — MIDAZOLAM HCL 2 MG/2ML IJ SOLN
INTRAMUSCULAR | Status: AC
  Filled 2017-11-28: qty 2

## 2017-11-28 MED ORDER — APIXABAN 2.5 MG PO TABS
2.5000 mg | ORAL_TABLET | Freq: Two times a day (BID) | ORAL | Status: DC
  Administered 2017-11-29: 2.5 mg via ORAL
  Filled 2017-11-28: qty 1

## 2017-11-28 MED ORDER — MORPHINE SULFATE (PF) 4 MG/ML IV SOLN
INTRAVENOUS | Status: AC
  Filled 2017-11-28: qty 1

## 2017-11-28 MED ORDER — MIDAZOLAM HCL 5 MG/5ML IJ SOLN
INTRAMUSCULAR | Status: DC | PRN
  Administered 2017-11-28 (×2): 1 mg via INTRAVENOUS

## 2017-11-28 MED ORDER — PHENOL 1.4 % MT LIQD
1.0000 | OROMUCOSAL | Status: DC | PRN
  Filled 2017-11-28: qty 177

## 2017-11-28 MED ORDER — DILTIAZEM HCL ER COATED BEADS 120 MG PO CP24
120.0000 mg | ORAL_CAPSULE | Freq: Every day | ORAL | Status: DC
  Administered 2017-11-29: 120 mg via ORAL
  Filled 2017-11-28 (×2): qty 1

## 2017-11-28 MED ORDER — MORPHINE SULFATE (PF) 2 MG/ML IV SOLN: 2.0000 mg | INTRAVENOUS | Status: DC | PRN

## 2017-11-28 MED ORDER — METOCLOPRAMIDE HCL 5 MG/ML IJ SOLN: 5.0000 mg | Freq: Three times a day (TID) | INTRAMUSCULAR | Status: DC | PRN

## 2017-11-28 MED ORDER — METOCLOPRAMIDE HCL 10 MG PO TABS: 5.0000 mg | ORAL_TABLET | Freq: Three times a day (TID) | ORAL | Status: DC | PRN

## 2017-11-28 MED ORDER — PROPOFOL 500 MG/50ML IV EMUL
INTRAVENOUS | Status: AC
  Filled 2017-11-28: qty 50

## 2017-11-28 SURGICAL SUPPLY — 74 items
AUTOTRANSFUS HAS 1/8 (MISCELLANEOUS)
BAG DECANTER FOR FLEXI CONT (MISCELLANEOUS) ×3 IMPLANT
BLADE SAW 1 (BLADE) ×3 IMPLANT
BLADE SAW 1/2 (BLADE) ×3 IMPLANT
CANISTER SUCT 1200ML W/VALVE (MISCELLANEOUS) ×3 IMPLANT
CANISTER SUCT 3000ML PPV (MISCELLANEOUS) ×6 IMPLANT
CAP KNEE TOTAL 3 SIGMA ×2 IMPLANT
CEMENT HV SMART SET (Cement) ×6 IMPLANT
CNTNR SPEC 2.5X3XGRAD LEK (MISCELLANEOUS) ×1
CONT SPEC 4OZ STER OR WHT (MISCELLANEOUS) ×2
CONT SPEC 4OZ STRL OR WHT (MISCELLANEOUS) ×1
CONTAINER SPEC 2.5X3XGRAD LEK (MISCELLANEOUS) ×1 IMPLANT
COOLER POLAR GLACIER W/PUMP (MISCELLANEOUS) ×3 IMPLANT
CUFF TOURN 24 STER (MISCELLANEOUS) IMPLANT
CUFF TOURN 30 STER DUAL PORT (MISCELLANEOUS) ×2 IMPLANT
DRAPE IMP U-DRAPE 54X76 (DRAPES) ×3 IMPLANT
DRAPE INCISE IOBAN 66X60 STRL (DRAPES) ×3 IMPLANT
DRAPE SHEET LG 3/4 BI-LAMINATE (DRAPES) ×6 IMPLANT
DRAPE SURG 17X11 SM STRL (DRAPES) ×6 IMPLANT
DRSG OPSITE POSTOP 4X12 (GAUZE/BANDAGES/DRESSINGS) ×3 IMPLANT
DRSG OPSITE POSTOP 4X14 (GAUZE/BANDAGES/DRESSINGS) ×1 IMPLANT
DURAPREP 26ML APPLICATOR (WOUND CARE) ×9 IMPLANT
ELECT REM PT RETURN 9FT ADLT (ELECTROSURGICAL) ×3
ELECTRODE REM PT RTRN 9FT ADLT (ELECTROSURGICAL) ×1 IMPLANT
GAUZE SPONGE 4X4 12PLY STRL (GAUZE/BANDAGES/DRESSINGS) ×3 IMPLANT
GAUZE XEROFORM 4X4 STRL (GAUZE/BANDAGES/DRESSINGS) ×2 IMPLANT
GLOVE BIO SURGEON STRL SZ 6.5 (GLOVE) ×2 IMPLANT
GLOVE BIO SURGEON STRL SZ8 (GLOVE) ×6 IMPLANT
GLOVE BIO SURGEONS STRL SZ 6.5 (GLOVE) ×2
GLOVE BIOGEL PI IND STRL 8.5 (GLOVE) IMPLANT
GLOVE BIOGEL PI IND STRL 9 (GLOVE) ×1 IMPLANT
GLOVE BIOGEL PI INDICATOR 8.5 (GLOVE) ×2
GLOVE BIOGEL PI INDICATOR 9 (GLOVE) ×2
GLOVE PI ULTRA LF STRL 7.5 (GLOVE) IMPLANT
GLOVE PI ULTRA NON LATEX 7.5 (GLOVE) ×4
GLOVE SURG 9.0 ORTHO LTXF (GLOVE) ×8 IMPLANT
GOWN STRL REUS TWL 2XL XL LVL4 (GOWN DISPOSABLE) ×3 IMPLANT
GOWN STRL REUS W/ TWL LRG LVL3 (GOWN DISPOSABLE) ×1 IMPLANT
GOWN STRL REUS W/ TWL LRG LVL4 (GOWN DISPOSABLE) ×1 IMPLANT
GOWN STRL REUS W/TWL LRG LVL3 (GOWN DISPOSABLE) ×3
GOWN STRL REUS W/TWL LRG LVL4 (GOWN DISPOSABLE) ×3
HOLDER FOLEY CATH W/STRAP (MISCELLANEOUS) ×3 IMPLANT
IMMBOLIZER KNEE 19 BLUE UNIV (SOFTGOODS) ×3 IMPLANT
IV NS 100ML SINGLE PACK (IV SOLUTION) ×3 IMPLANT
KIT RM TURNOVER STRD PROC AR (KITS) ×3 IMPLANT
NDL SAFETY ECLIPSE 18X1.5 (NEEDLE) ×1 IMPLANT
NDL SPNL 20GX3.5 QUINCKE YW (NEEDLE) ×1 IMPLANT
NEEDLE HYPO 18GX1.5 SHARP (NEEDLE) ×3
NEEDLE HYPO 22GX1.5 SAFETY (NEEDLE) ×3 IMPLANT
NEEDLE SPNL 20GX3.5 QUINCKE YW (NEEDLE) ×3 IMPLANT
NS IRRIG 1000ML POUR BTL (IV SOLUTION) ×3 IMPLANT
PACK TOTAL KNEE (MISCELLANEOUS) ×3 IMPLANT
PAD PREP 24X41 OB/GYN DISP (PERSONAL CARE ITEMS) ×3 IMPLANT
PAD WRAPON POLAR KNEE (MISCELLANEOUS) ×1 IMPLANT
PULSAVAC PLUS IRRIG FAN TIP (DISPOSABLE) ×3
SOL .9 NS 3000ML IRR  AL (IV SOLUTION) ×2
SOL .9 NS 3000ML IRR AL (IV SOLUTION) ×1
SOL .9 NS 3000ML IRR UROMATIC (IV SOLUTION) ×1 IMPLANT
SPONGE DRAIN TRACH 4X4 STRL 2S (GAUZE/BANDAGES/DRESSINGS) ×3 IMPLANT
SPONGE LAP 18X18 5 PK (GAUZE/BANDAGES/DRESSINGS) IMPLANT
STAPLER SKIN PROX 35W (STAPLE) ×3 IMPLANT
SUCTION FRAZIER HANDLE 10FR (MISCELLANEOUS) ×2
SUCTION TUBE FRAZIER 10FR DISP (MISCELLANEOUS) ×1 IMPLANT
SUT ETHIBOND NAB CT1 #1 30IN (SUTURE) ×6 IMPLANT
SUT VIC AB 0 CT1 36 (SUTURE) ×3 IMPLANT
SUT VIC AB 2-0 CT1 (SUTURE) ×6 IMPLANT
SYR 20CC LL (SYRINGE) ×3 IMPLANT
SYR 30ML LL (SYRINGE) ×6 IMPLANT
SYSTEM AUTOTRANSFUS DUAL TROCR (MISCELLANEOUS) IMPLANT
TIP FAN IRRIG PULSAVAC PLUS (DISPOSABLE) ×1 IMPLANT
TOWER CARTRIDGE SMART MIX (DISPOSABLE) ×3 IMPLANT
TRAY FOLEY W/METER SILVER 16FR (SET/KITS/TRAYS/PACK) ×3 IMPLANT
TUBE SUCT KAM VAC (TUBING) ×3 IMPLANT
WRAPON POLAR PAD KNEE (MISCELLANEOUS) ×3

## 2017-11-28 NOTE — Transfer of Care (Signed)
Immediate Anesthesia Transfer of Care Note  Patient: Caroline [name]  Procedure(s) Performed: TOTAL KNEE ARTHROPLASTY (Left Knee)  Patient Location: PACU  Anesthesia Type:Spinal  Level of Consciousness: awake  Airway & Oxygen Therapy: Patient Spontanous Breathing and Patient connected to nasal cannula oxygen  Post-op Assessment: Report given to RN and Post -op Vital signs reviewed and stable  Post vital signs: Reviewed and stable  Last Vitals:  Vitals:   11/28/17 0625 11/28/17 1111  BP: (!) 157/77 (!) 150/62  Pulse: 69 (!) 54  Resp: 12   Temp: (!) 36.2 C (!) 36.4 C  SpO2: 99% 96%    Last Pain:  Vitals:   11/28/17 0625  TempSrc: Tympanic         Complications: No apparent anesthesia complications

## 2017-11-28 NOTE — Evaluation (Signed)
Physical Therapy Evaluation Patient Details Name: Caroline [name] MRN: [id number] DOB: Dec 07, 1941 Today's Date: 11/28/2017   History of Present Illness  admitted for acute hospitalization status post L TKR (11/28/17).  Clinical Impression  Upon evaluation, patient alert and oriented; follows all commands and demonstrates good insight, good effort with all functional activities.  L knee strength (3-/5) and ROM (4-74 degrees) generally limited by post-op pain and surgical dressings; however, fair/good L quad activation noted with isolated supine therex.  Excellent tolerance for supine activities; minimal pain reported.  OOB mobility deferred this date secondary to patient currently undergoing autovac transfusion.  Will complete mobility assessment and establish appropriate goals next session. Anticipate good progress with OOB efforts given performance this session.  Did encourage dangling to edge of bed, OOB to chair with nursing this evening as appropriate. Would benefit from skilled PT to address above deficits and promote optimal return to PLOF; will complete discharge recommendations next session once OOB/mobility assessment complete.    Follow Up Recommendations (will complete pending full mobility assessment)    Equipment Recommendations       Recommendations for Other Services       Precautions / Restrictions Precautions Precautions: Fall Required Braces or Orthoses: Knee Immobilizer - Left Knee Immobilizer - Left: (on when resting in bed/chair) Restrictions Weight Bearing Restrictions: Yes LLE Weight Bearing: Weight bearing as tolerated      Mobility  Bed Mobility               General bed mobility comments: deferred secondary to currently undergoing autovac transfusion  Transfers                 General transfer comment: deferred secondary to currently undergoing autovac transfusion  Ambulation/Gait             General Gait Details: deferred secondary  to currently undergoing autovac transfusion  Stairs            Wheelchair Mobility    Modified Rankin (Stroke Patients Only)       Balance                                             Pertinent Vitals/Pain Pain Assessment: 0-10 Pain Score: 2  Pain Location: L knee Pain Descriptors / Indicators: Aching;Grimacing;Guarding Pain Intervention(s): Premedicated before session;Limited activity within patient's tolerance;Monitored during session;Repositioned    Home Living Family/patient expects to be discharged to:: Private residence Living Arrangements: Spouse/significant other Available Help at Discharge: Family;Available 24 hours/day Type of Home: House Home Access: Stairs to enter Entrance Stairs-Rails: Left Entrance Stairs-Number of Steps: 3 Home Layout: One level Home Equipment: None      Prior Function Level of Independence: Independent         Comments: Pt indep with ADL, IADL, household and community mobilization; + driving; no falls endorsed in past 12 months      Hand Dominance        Extremity/Trunk Assessment   Upper Extremity Assessment Upper Extremity Assessment: Overall WFL for tasks assessed    Lower Extremity Assessment Lower Extremity Assessment: (L knee grossly 3-/5, limited by post-op dressing, pain; otherwise, grossly WFL with sensation fully intact)       Communication   Communication: HOH  Cognition Arousal/Alertness: Awake/alert Behavior During Therapy: WFL for tasks assessed/performed Overall Cognitive Status: Within Functional Limits for tasks assessed  General Comments      Exercises Total Joint Exercises Goniometric ROM: L knee: 4-75 degrees Other Exercises Other Exercises: Supine L LE therex, 1x10, AROM for muscular strength/endurance: ankle pumps, quad sets, SAQs, heel slides, hip abduct/adduct and SLR.  Good activation of L quad in isolation;  mild difficulty with anti-gravity movements (and appropriate activation of quads/hams), mild lag with SLR.   Assessment/Plan    PT Assessment Patient needs continued PT services  PT Problem List Decreased strength;Decreased range of motion;Decreased activity tolerance;Decreased balance;Decreased mobility;Decreased coordination;Decreased cognition;Decreased knowledge of use of DME;Decreased safety awareness;Decreased knowledge of precautions;Pain;Decreased skin integrity       PT Treatment Interventions DME instruction;Stair training;Gait training;Functional mobility training;Therapeutic activities;Therapeutic exercise;Balance training;Patient/family education    PT Goals (Current goals can be found in the Care Plan section)  Acute Rehab PT Goals Patient Stated Goal: to do what I need to do PT Goal Formulation: With patient Time For Goal Achievement: 12/12/17 Potential to Achieve Goals: Good    Frequency BID   Barriers to discharge        Co-evaluation               AM-PAC PT "6 Clicks" Daily Activity  Outcome Measure Difficulty turning over in bed (including adjusting bedclothes, sheets and blankets)?: Unable Difficulty moving from lying on back to sitting on the side of the bed? : Unable Difficulty sitting down on and standing up from a chair with arms (e.g., wheelchair, bedside commode, etc,.)?: Unable Help needed moving to and from a bed to chair (including a wheelchair)?: A Little Help needed walking in hospital room?: A Little Help needed climbing 3-5 steps with a railing? : A Lot 6 Click Score: 11    End of Session   Activity Tolerance: Patient tolerated treatment well;Treatment limited secondary to medical complications (Comment)(currently undergoing autovac transfusion) Patient left: in bed;with call bell/phone within reach;with family/visitor present;with bed alarm set   PT Visit Diagnosis: Muscle weakness (generalized) (M62.81);Difficulty in walking, not  elsewhere classified (R26.2);Pain Pain - Right/Left: Left Pain - part of body: Knee    Time: 3335-4562 PT Time Calculation (min) (ACUTE ONLY): 20 min   Charges:   PT Evaluation $PT Eval Low Complexity: 1 Low PT Treatments $Therapeutic Exercise: 8-22 mins   PT G Codes:   PT G-Codes **NOT FOR INPATIENT CLASS** Functional Assessment Tool Used: AM-PAC 6 Clicks Basic Mobility Functional Limitation: Mobility: Walking and moving around Mobility: Walking and Moving Around Current Status (B6389): At least 20 percent but less than 40 percent impaired, limited or restricted Mobility: Walking and Moving Around Goal Status [phone]): At least 1 percent but less than 20 percent impaired, limited or restricted    Sheriann Newmann H. Owens Shark, PT, DPT, NCS 11/28/17, 4:21 PM [phone]

## 2017-11-28 NOTE — H&P (Signed)
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 Symptoms are rated as moderate to severe, and have been worsening.  This is significantly impairing activities of daily living.  She has elected for surgical management.  Patient is more symptomatic on the left side and has elected to have the left knee replaced first.    Past Medical History:  Diagnosis Date  . Arthritis   . Chronic kidney disease    STAGE 4  . History of kidney stones 01/2017  . Hypertension   . Hypothyroidism   . Stroke [facility])    TIA 9/17 & 04/06/2017  . Thyroid disease    Past Surgical History:  Procedure Laterality Date  . ABDOMINAL HYSTERECTOMY    . CARDIAC CATHETERIZATION     Negative   . COLONOSCOPY  2010  . PARTIAL HYSTERECTOMY     Social History   Socioeconomic History  . Marital status: Married    Spouse name: None  . Number of children: None  . Years of education: None  . Highest education level: None  Social Needs  . Financial resource strain: None  . Food insecurity - worry: None  . Food insecurity - inability: None  . Transportation needs - medical: None  . Transportation needs - non-medical: None  Occupational History  . None  Tobacco Use  . Smoking status: Never Smoker  . Smokeless tobacco: Never Used  Substance and Sexual Activity  . Alcohol use: Yes    Alcohol/week: 0.6 oz    Types: 1 Glasses of wine per week    Comment: social   . Drug use: No  . Sexual activity: None  Other Topics Concern  . None  Social History Narrative  . None   Family History  Problem Relation Age of Onset  . Heart attack Father 56  . Hypertension Father   . Hyperlipidemia Father   . Heart disease Brother        valvular disease    No Known Allergies Prior to Admission medications   Medication Sig Start  Date End Date Taking? Authorizing Provider  acetaminophen (TYLENOL) 500 MG tablet Take 1,000 mg by mouth daily as needed (for pain.).   Yes [provider]  allopurinol (ZYLOPRIM) 100 MG tablet Take 100 mg by mouth daily.  09/08/16  Yes [provider]  apixaban (ELIQUIS) 5 MG TABS tablet Take 1 tablet (5 mg total) by mouth 2 (two) times daily. 08/15/17  Yes Gollan, Kathlene November, MD  cyanocobalamin (,VITAMIN B-12,) 1000 MCG/ML injection Inject 1,000 mcg into the muscle every 30 (thirty) days.   Yes [provider]  furosemide (LASIX) 20 MG tablet Take 20 mg by mouth daily.   Yes [provider]  KLOR-CON M10 10 MEQ tablet Take 10 mEq by mouth 2 (two) times daily. 09/15/17  Yes [provider]  levothyroxine (SYNTHROID, LEVOTHROID) 112 MCG tablet Take 112 mcg by mouth daily before breakfast. 09/05/17  Yes [provider]  LINIMENTS EX Apply 1 application topically 3 (three) times daily as needed (for pain). Horse Lininment   Yes [provider]  lisinopril-hydrochlorothiazide (PRINZIDE,ZESTORETIC) 20-25 MG tablet Take 1 tablet by mouth daily.  04/21/17  Yes [provider]  metoprolol tartrate (LOPRESSOR) 25 MG tablet Take 1 tablet (25 mg total) by mouth 2 (two) times daily. 07/14/17 11/24/18  Yes Gollan, Kathlene November, MD  potassium chloride (MICRO-K) 10 MEQ CR capsule Take 40 mEq by mouth 2 (two) times daily.   Yes [provider]  simvastatin (ZOCOR) 20 MG tablet Take 20 mg by mouth at bedtime.   Yes [provider]  CARTIA XT 120 MG 24 hr capsule TAKE 1 CAPSULE BY MOUTH ONCE DAILY 11/27/17   Minna Merritts, MD  rosuvastatin (CRESTOR) 40 MG tablet Take 1 tablet (40 mg total) by mouth daily. Patient not taking: Reported on 11/24/2017 07/14/17   Minna Merritts, MD     Positive ROS: All other systems have been reviewed and were otherwise negative with the exception of those mentioned in the HPI and as above.  Physical  Exam: General: Alert, no acute distress Cardiovascular: Regular rate and rhythm, no murmurs rubs or gallops.  No pedal edema Respiratory: Clear to auscultation bilaterally, no wheezes rales or rhonchi. No cyanosis, no use of accessory musculature GI: No organomegaly, abdomen is soft and non-tender nondistended with positive bowel sounds. Skin: Skin intact, no lesions within the operative field. Neurologic: Sensation intact distally Psychiatric: Patient is competent for consent with normal mood and affect Lymphatic: No cervical lymphadenopathy  MUSCULOSKELETAL: LEFT KNEE:  Skin intact.  No erythema, ecchymosis or effusion.  ROM -5 to 110 degrees.  + varus deformity and medial joint line tenderness.  No ligamentous laxity.  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  Her previous surgery was canceled due to hypokalemia but this have been address and rectified by her PCP.  I discussed the risks and benefits of surgery. The risks include but are not limited to infection, bleeding requiring blood transfusion, nerve or blood vessel injury, joint stiffness or loss of motion, persistent pain, weakness or instability, malunion, nonunion and hardware failure and the need for further surgery. Medical risks include but are not limited to DVT and pulmonary embolism, myocardial infarction, stroke, pneumonia, respiratory failure and death. Patient understood these risks and wished to proceed.   Thornton Park, MD   11/28/2017 7:48 AM

## 2017-11-28 NOTE — Anesthesia Procedure Notes (Signed)
Date/Time: 11/28/2017 8:52 AM Performed by: Allean Found, CRNA Pre-anesthesia Checklist: Patient identified, Emergency Drugs available, Suction available, Patient being monitored and Timeout performed Oxygen Delivery Method: Nasal cannula

## 2017-11-28 NOTE — Progress Notes (Signed)
Patient's BP 177/71. Patient rates pain 2/10  MD Mack Guise made aware and received verbal orders for consult to hospitalist.

## 2017-11-28 NOTE — Op Note (Signed)
DATE OF SURGERY:  11/28/2017 TIME: 11:10 AM  PATIENT NAME:  Caroline [name]   AGE: 76 y.o.    PRE-OPERATIVE DIAGNOSIS:  Left knee osteoarthritis   POST-OPERATIVE DIAGNOSIS:  Same  PROCEDURE:  Procedure(s): LEFT TOTAL KNEE ARTHROPLASTY  SURGEON:  Thornton Park, MD   ASSISTANT:  Carlynn Spry, PA  OPERATIVE IMPLANTS: Depuy PFC Sigma, Posterior Stabilized Femural component size 3, Tibia size rotating platform component size 3, Patella polyethylene 3-peg oval button size 38, with a 15 mm polyethylene insert.   PREOPERATIVE INDICATIONS:  Caroline [name] is an 76 y.o. female who has a diagnosis of advanced left knee osteoarthritis and elected for a left total knee arthroplasty after failing nonoperative treatment, including NSAIDS, corticosteroid injections and exercise who has significant impairment of her activities of daily living including ambulation.  Radiographs have demonstrated tricompartmental osteoarthritis joint space narrowing, osteophytes, subchondral sclerosis and cyst formation.  The risks, benefits, and alternatives were discussed at length including but not limited to the risks of infection, bleeding, nerve or blood vessel injury, knee stiffness, fracture, dislocation, loosening or failure of the hardware and the need for further surgery. Medical risks include but not limited to DVT and pulmonary embolism, myocardial infarction, stroke, pneumonia, respiratory failure and death. I discussed these risks with the patient in my office prior to the date of surgery. They understood these risks and were willing to proceed.  OPERATIVE FINDINGS AND UNIQUE ASPECTS OF THE CASE:  Advanced tricompartmental osteoarthritis   OPERATIVE DESCRIPTION:  The patient was brought to the operative room and placed in a supine position after undergoing placement of a spinal anesthetic.  A Foley catheter was placed.  IV antibiotics were given. Patient received ancef and clindamycin. The lower  extremity was prepped and draped in the usual sterile fashion.  A time out was performed to verify the patient's name, date of birth, medical record number, correct site of surgery and correct procedure to be performed. The timeout was also used to confirm the patient received antibiotics and that appropriate instruments, implants and radiographs studies were available in the room.  The leg was elevated and exsanguinated with an Esmarch and the tourniquet was inflated to 275 mmHg for 124 minutes..  A midline incision was made over the left knee. Full-thickness skin flaps were developed. A medial parapatellar arthrotomy was then made and the patella everted and the knee was brought into 90 of flexion. Hoffa's fat pad along with the cruciate ligaments and medial and lateral menisci were resected.   The distal femoral intramedullary canal was opened with a drill and the intramedullary distal femoral cutting jig was inserted into the femoral canal pinned into position. It was set at 5 degrees resecting 10 mm off the distal femur.  Care was taken to protect the collateral ligaments during distal femoral resection.  The distal femoral resection was performed with an oscillating saw. The femoral cutting guide was then removed.  The extramedullary tibial cutting guide was then placed using the anterior tibial crest and second ray of the foot as a references.  The tibial cutting guide was adjusted to allow for appropriate posterior slope.  The tibial cutting block was pinned into position. The slotted stylus was used to measure the proximal tibial resection of 10 mm off the high lateral side.  The tibial long rod alignment guide was then used to confirm position of the cutting block. A third cross pin through the tibial cutting block was then drilled into position to allow for  rotational stability. Care was taken during the tibial resection to protect the medial and collateral ligaments.  The resected tibial bone was  removed along with the posterior horns of the menisci.  The PCL was sacrificed.  Extension gap was measured with a spacer block and alignment and extension was confirmed using a long alignment rod.  The attention was then turned back to the femur. The posterior referencing distal femoral sizing guide was applied to the distal femur.  The femur was sized to be a 3. Rotation of the referencing guide was checked with the epicondylar axis and Whitesides line. Then the 4-in-1 cutting jig was then applied to the distal femur. A stylus was used to confirm that the anterior femur would not be notched.   Then the anterior, posterior and chamfer femoral cuts were then made with an oscillating saw.  All posterior osteophytes were removed.  The flexion gap was then measured with a flexion spacer block and long alignment rod and was found to be symmetric with the extension gap and perpendicular to mechanical axis of the tibia.  The distal femoral preparation was completed by performing the posterior stabilized box cut using the cutting block. The entry site for the intramedullary femoral guide was filled with autologous bone graft from bone previously resected earlier in the case.  The proximal tibia plateau was then sized with trial trays. The best coverage was achieved with a size 3 tibial tray. This tibial tray was then pinned into position. The proximal tibia was then prepared with the reamer and keel punch.  After tibial preparation was completed, all trial components were inserted with polyethylene trials.  The knee was found to have excellent balance and full motion with a size 12.5 mm tibial polyethylene insert..    The attention was then turned to preparation of the patella. The thickness of the patella was measured with a caliper, the diameter measured with the patella templates.  The patella resection was then made with an oscillating saw using the patella cutting guide.   3 peg holes for the patella component  were then drilled. The trial patella was then placed. Knee was taken through a full range of motion and deemed to be stable with the trial components. All trial components were then removed. The knee capsule was then injected with Exparel. The joint was copiously irrigated with pulse lavage.  The final total knee arthroplasty components were then cemented into place with a 12.5 mm trial polyethylene insert and all excess methylmethacrylate was removed.  The joint was again copiously irrigated. After the cement had hardened the knee was again taken through a full range of motion. It was felt to be most stable with the 15 mm tibial polyethylene insert. The actual tibial polyethylene insert was then placed.   The knee was taken through a range of motion and the patella tracked well and the knee was again irrigated copiously.    An Autovac drain was placed. The 2 drain limbs were brought out through the superior lateral knee. The medial arthrotomy was closed with #1 Ethibond. The subcutaneous tissue closed with 0 and 2-0 vicryl, and skin approximated with staples.  A dry sterile and compressive dressing was applied.  A Polar Care was applied to the operative knee along with a knee immobilizer.  The patient was awakened and brought to the PACU in stable and satisfactory condition.  All sharp, lap and instrument counts were correct at the conclusion the case. I spoke with the patient's  family in the postop consultation room to let them know the case had been performed without complication and the patient was stable in recovery room.   Total tourniquet time was 124 minutes.

## 2017-11-28 NOTE — Anesthesia Preprocedure Evaluation (Signed)
Anesthesia Evaluation  Patient identified by MRN, date of birth, ID band Patient awake    Reviewed: Allergy & Precautions, H&P , NPO status , Patient's Chart, lab work & pertinent test results, reviewed documented beta blocker date and time   History of Anesthesia Complications Negative for: history of anesthetic complications  Airway Mallampati: II   Neck ROM: full    Dental  (+) Teeth Intact   Pulmonary neg pulmonary ROS,    Pulmonary exam normal        Cardiovascular Exercise Tolerance: Poor hypertension, On Medications + CAD and + Peripheral Vascular Disease       Neuro/Psych neg Seizures TIACVA, No Residual Symptoms negative psych ROS   GI/Hepatic negative GI ROS, Neg liver ROS,   Endo/Other  Hypothyroidism   Renal/GU CRFRenal disease  negative genitourinary   Musculoskeletal   Abdominal   Peds  Hematology negative hematology ROS (+)   Anesthesia Other Findings Past Medical History: No date: Arthritis No date: Chronic kidney disease     Comment:  STAGE 4 01/2017: History of kidney stones No date: Hypertension No date: Hypothyroidism No date: Stroke [facility])     Comment:  TIA 9/17 & 04/06/2017 No date: Thyroid disease Past Surgical History: No date: ABDOMINAL HYSTERECTOMY No date: CARDIAC CATHETERIZATION     Comment:  Negative  2010: COLONOSCOPY No date: PARTIAL HYSTERECTOMY BMI    Body Mass Index:  27.90 kg/m     Reproductive/Obstetrics negative OB ROS                             Anesthesia Physical  Anesthesia Plan  ASA: III  Anesthesia Plan: Spinal   Post-op Pain Management:    Induction:   PONV Risk Score and Plan: 4 or greater and Ondansetron, Dexamethasone and Propofol infusion  Airway Management Planned: Simple Face Mask  Additional Equipment:   Intra-op Plan:   Post-operative Plan:   Informed Consent: I have reviewed the patients History and  Physical, chart, labs and discussed the procedure including the risks, benefits and alternatives for the proposed anesthesia with the patient or authorized representative who has indicated his/her understanding and acceptance.   Dental Advisory Given  Plan Discussed with: CRNA  Anesthesia Plan Comments:         Anesthesia Quick Evaluation

## 2017-11-28 NOTE — H&P (Signed)
The patient has been re-examined, and the chart reviewed, and there have been no interval changes to the documented history and physical.    The risks, benefits, and alternatives have been discussed at length, and the patient is willing to proceed.   

## 2017-11-28 NOTE — H&P (Signed)
Reason for Consult: Elevated blood pressure Referring Physician: [name] is an 76 y.o. female.  HPI: Caroline [name] is a 76 year old female with a past medical history of chronic kidney disease stage IV, hypertension, hypothyroidism, hyperlipidemia had total left knee arthroplasty by Dr. Mack Guise and hospitalist team is called for elevated blood pressure. Patient reports her left knee pain is 5 out of 10 after taking the pain medicine and also just worked with the physical therapy. Denies any chest pain or shortness of breath. Daughter is at bedside.  Past Medical History:  Diagnosis Date  . Arthritis   . Chronic kidney disease    STAGE 4  . History of kidney stones 01/2017  . Hypertension   . Hypothyroidism   . Stroke [facility])    TIA 9/17 & 04/06/2017  . Thyroid disease     Past Surgical History:  Procedure Laterality Date  . ABDOMINAL HYSTERECTOMY    . CARDIAC CATHETERIZATION     Negative   . COLONOSCOPY  2010  . PARTIAL HYSTERECTOMY    . TOTAL KNEE ARTHROPLASTY Left 11/28/2017   Procedure: TOTAL KNEE ARTHROPLASTY;  Surgeon: Thornton Park, MD;  Location: [facility] ORS;  Service: Orthopedics;  Laterality: Left;    Family History  Problem Relation Age of Onset  . Heart attack Father 81  . Hypertension Father   . Hyperlipidemia Father   . Heart disease Brother        valvular disease     Social History:  reports that  has never smoked. she has never used smokeless tobacco. She reports that she drinks about 0.6 oz of alcohol per week. She reports that she does not use drugs.  Allergies: No Known Allergies  Medications: I have reviewed the patient's current medications.  Results for orders placed or performed during the hospital encounter of 11/28/17 (from the past 48 hour(s))  Type and screen [place]     Status: None   Collection Time: 11/28/17  6:20 AM  Result Value Ref Range   ABO/RH(D) A POS    Antibody Screen NEG    Sample  Expiration 12/01/2017   I-STAT 4, (NA,K, GLUC, HGB,HCT)     Status: Abnormal   Collection Time: 11/28/17  6:36 AM  Result Value Ref Range   Sodium 140 135 - 145 mmol/L   Potassium 3.6 3.5 - 5.1 mmol/L   Glucose, Bld 117 (H) 65 - 99 mg/dL   HCT 36.0 36.0 - 46.0 %   Hemoglobin 12.2 12.0 - 15.0 g/dL    Dg Knee Left Port  Result Date: 11/28/2017 CLINICAL DATA:  Post left knee replacement EXAM: PORTABLE LEFT KNEE - 1-2 VIEW COMPARISON:  None. FINDINGS: Portable views of the left knee show the femoral and tibial components of the left total knee replacement to be in good position and alignment. No complicating features are seen. A small amount air is noted within soft tissues postoperatively and surgical drains are present. IMPRESSION: Left total knee replacement components in good position and alignment. No complicating features. Electronically Signed   By: Ivar Drape M.D.   On: 11/28/2017 11:47    ROS:  CONSTITUTIONAL: Denies fevers, chills. Denies any fatigue, weakness.  EYES: Denies blurry vision, double vision, eye pain. EARS, NOSE, THROAT: Denies tinnitus, ear pain, hearing loss. RESPIRATORY: Denies cough, wheeze, shortness of breath.  CARDIOVASCULAR: Denies chest pain, palpitations, edema.  GASTROINTESTINAL: Denies nausea, vomiting, diarrhea, abdominal pain. Denies bright red blood per rectum. GENITOURINARY: Denies dysuria, hematuria.  ENDOCRINE: Denies nocturia or thyroid problems. HEMATOLOGIC AND LYMPHATIC: Denies easy bruising or bleeding. SKIN: Denies rash or lesion. MUSCULOSKELETAL: Denies pain in neck, back, shoulder. Reporting left knee pain NEUROLOGIC: Denies paralysis, paresthesias.  PSYCHIATRIC: Denies anxiety or depressive symptoms. Blood pressure (!) 175/61, pulse 62, temperature 97.7 F (36.5 C), temperature source Oral, resp. rate 20, height 5\' 2"  (1.575 m), weight 71.7 kg (158 lb), SpO2 94 %.   PHYSICAL EXAMINATION:  GENERAL: Well-nourished, well-developed ,  currently in no acute distress.  HEAD: Normocephalic, atraumatic.  EYES: Pupils equal, round, and reactive to light. Extraocular muscles intact. No scleral icterus.  MOUTH: Moist mucosal membranes. Dentition intact. No abscess noted. EARS, NOSE, THROAT: Clear without exudates. No external lesions.  NECK: Supple. No thyromegaly. No nodules. No JVD.  PULMONARY: Clear to auscultation bilaterally without wheezes, rales, or rhonchi. No use of accessory muscles. Good respiratory effort. CHEST: Nontender to palpation.  CARDIOVASCULAR: S1, S2, regular rate and rhythm. No murmurs, rubs, or gallops.  GASTROINTESTINAL: Soft, nontender, nondistended. No masses. Positive bowel sounds. No hepatosplenomegaly. MUSCULOSKELETAL: No swelling, clubbing, edema. Range of motion full in all extremities except left lower extremity. Left lower extremity status post left knee arthroplasty NEUROLOGIC: Cranial nerves II-XII intact. No gross focal neurological deficits. Sensation intact. Reflexes intact. SKIN: No ulcerations, lesions, rash, cyanosis. Skin warm, dry. Turgor intact. PSYCHIATRIC: Mood, affect within normal limits. Patient awake, alert, oriented x 3. Insight and judgment intact.   Assessment/Plan: Hospitalist team is consulted for elevated blood pressure  # Essential hypertension-uncontrolled blood pressure Patient needs adequate pain control Received IV fluids for surgery which could elevate the blood pressure too Will increase lisinopril to 40 mg and titrate as needed Continue hydrochlorothiazide Continue Cardizem, metoprolol Check BMP in a.m.  #Paroxysmal atrial fibrillation rate controlled Continue Cardizem CD 120 mg and eliquis  #Hyperlipidemia continue Zocor  #Left knee arthroplasty postop day 0 Pain management per ortho Continue Robaxin and morphine as needed for severe pain  #Chronic kidney disease Avoid nephrotoxins and check BMP in a.m.   Thank you Dr. Mack Guise for allowing  hospitalist team to take care of the patient  TOTAL TIME TAKING CARE OF THIS PATIENT: 42  minutes.   Note: This dictation was prepared with Dragon dictation along with smaller phrase technology. Any transcriptional errors that result from this process are unintentional.   @[facility] @ Pager - [phone] 11/28/2017, 5:54 PM

## 2017-11-28 NOTE — Anesthesia Post-op Follow-up Note (Signed)
Anesthesia QCDR form completed.        

## 2017-11-28 NOTE — Anesthesia Procedure Notes (Addendum)
Spinal  Patient location during procedure: OR Start time: 11/28/2017 8:01 AM End time: 11/28/2017 8:21 AM Staffing Anesthesiologist: Martha Clan, MD Resident/CRNA: Allean Found, CRNA Performed: resident/CRNA  Preanesthetic Checklist Completed: patient identified, site marked, surgical consent, pre-op evaluation, timeout performed, IV checked, risks and benefits discussed and monitors and equipment checked Spinal Block Patient position: sitting Prep: Betadine Patient monitoring: heart rate, continuous pulse ox, blood pressure and cardiac monitor Approach: midline Location: L3-4 Injection technique: single-shot Needle Needle type: Whitacre and Introducer  Needle gauge: 24 G Needle length: 9 cm Assessment Sensory level: T10 Additional Notes Negative paresthesia. Negative blood return. Positive free-flowing CSF. Expiration date of kit checked and confirmed. Patient tolerated procedure well, without complications.

## 2017-11-29 ENCOUNTER — Inpatient Hospital Stay (HOSPITAL_COMMUNITY)
Admission: RE | Admit: 2017-11-29 | Discharge: 2017-11-29 | Disposition: A | Discharge status: Home / Self Care | Payer: Medicare Other | Source: Ambulatory Visit | Attending: Internal Medicine | Admitting: Internal Medicine

## 2017-11-29 ENCOUNTER — Encounter: Payer: Self-pay | Admitting: Physician Assistant

## 2017-11-29 ENCOUNTER — Inpatient Hospital Stay: Payer: Medicare Other

## 2017-11-29 DIAGNOSIS — Z96652 Presence of left artificial knee joint: Secondary | ICD-10-CM

## 2017-11-29 DIAGNOSIS — I5031 Acute diastolic (congestive) heart failure: Secondary | ICD-10-CM

## 2017-11-29 DIAGNOSIS — I480 Paroxysmal atrial fibrillation: Secondary | ICD-10-CM

## 2017-11-29 DIAGNOSIS — R0602 Shortness of breath: Secondary | ICD-10-CM

## 2017-11-29 LAB — BASIC METABOLIC PANEL
Anion gap: 9 (ref 5–15)
BUN: 27 mg/dL — AB (ref 6–20)
CHLORIDE: 103 mmol/L (ref 101–111)
CO2: 24 mmol/L (ref 22–32)
Calcium: 8.7 mg/dL — ABNORMAL LOW (ref 8.9–10.3)
Creatinine, Ser: 1.57 mg/dL — ABNORMAL HIGH (ref 0.44–1.00)
GFR calc Af Amer: 36 mL/min — ABNORMAL LOW (ref >60)
GFR calc non Af Amer: 31 mL/min — ABNORMAL LOW (ref >60)
Glucose, Bld: 121 mg/dL — ABNORMAL HIGH (ref 65–99)
POTASSIUM: 3.7 mmol/L (ref 3.5–5.1)
SODIUM: 136 mmol/L (ref 135–145)

## 2017-11-29 LAB — CBC
HEMATOCRIT: 34.5 % — AB (ref 35.0–47.0)
HEMOGLOBIN: 11.5 g/dL — AB (ref 12.0–16.0)
MCH: 32.9 pg (ref 26.0–34.0)
MCHC: 33.2 g/dL (ref 32.0–36.0)
MCV: 99.2 fL (ref 80.0–100.0)
Platelets: 363 10*3/uL (ref 150–440)
RBC: 3.48 MIL/uL — AB (ref 3.80–5.20)
RDW: 14.3 % (ref 11.5–14.5)
WBC: 17.3 10*3/uL — ABNORMAL HIGH (ref 3.6–11.0)

## 2017-11-29 LAB — BRAIN NATRIURETIC PEPTIDE: B NATRIURETIC PEPTIDE 5: 537 pg/mL — AB (ref 0.0–100.0)

## 2017-11-29 MED ORDER — HYDRALAZINE HCL 20 MG/ML IJ SOLN
10.0000 mg | Freq: Four times a day (QID) | INTRAMUSCULAR | Status: DC | PRN
  Administered 2017-11-29: 10 mg via INTRAVENOUS
  Filled 2017-11-29: qty 1

## 2017-11-29 MED ORDER — FUROSEMIDE 10 MG/ML IJ SOLN
40.0000 mg | Freq: Two times a day (BID) | INTRAMUSCULAR | Status: DC
  Administered 2017-11-29 – 2017-11-30 (×2): 40 mg via INTRAVENOUS
  Filled 2017-11-29 (×2): qty 4

## 2017-11-29 MED ORDER — NITROGLYCERIN 2 % TD OINT
0.5000 [in_us] | TOPICAL_OINTMENT | Freq: Four times a day (QID) | TRANSDERMAL | Status: DC
  Administered 2017-11-29 – 2017-12-01 (×7): 0.5 [in_us] via TOPICAL
  Filled 2017-11-29 (×4): qty 0.5
  Filled 2017-11-29: qty 30
  Filled 2017-11-29: qty 0.5

## 2017-11-29 MED ORDER — APIXABAN 2.5 MG PO TABS
2.5000 mg | ORAL_TABLET | Freq: Two times a day (BID) | ORAL | Status: AC
  Administered 2017-11-29: 2.5 mg via ORAL
  Filled 2017-11-29: qty 1

## 2017-11-29 MED ORDER — AMLODIPINE BESYLATE 5 MG PO TABS
5.0000 mg | ORAL_TABLET | Freq: Every day | ORAL | Status: DC
  Administered 2017-11-29 – 2017-11-30 (×2): 5 mg via ORAL
  Filled 2017-11-29 (×2): qty 1

## 2017-11-29 MED ORDER — IPRATROPIUM-ALBUTEROL 0.5-2.5 (3) MG/3ML IN SOLN
3.0000 mL | Freq: Four times a day (QID) | RESPIRATORY_TRACT | Status: DC
  Administered 2017-11-30: 3 mL via RESPIRATORY_TRACT
  Filled 2017-11-29: qty 3

## 2017-11-29 MED ORDER — FUROSEMIDE 10 MG/ML IJ SOLN
40.0000 mg | Freq: Two times a day (BID) | INTRAMUSCULAR | Status: DC
  Administered 2017-11-29: 40 mg via INTRAVENOUS
  Filled 2017-11-29: qty 4

## 2017-11-29 MED ORDER — IPRATROPIUM-ALBUTEROL 0.5-2.5 (3) MG/3ML IN SOLN
3.0000 mL | RESPIRATORY_TRACT | Status: DC
  Administered 2017-11-29 (×2): 3 mL via RESPIRATORY_TRACT
  Filled 2017-11-29 (×3): qty 3

## 2017-11-29 MED ORDER — CLONIDINE HCL 0.1 MG PO TABS
0.1000 mg | ORAL_TABLET | Freq: Four times a day (QID) | ORAL | Status: DC | PRN
  Filled 2017-11-29: qty 1

## 2017-11-29 NOTE — Progress Notes (Signed)
PT Cancellation Note  Patient Details Name: Caroline [name] MRN: [id number] DOB: [birth year]   Cancelled Treatment:    Reason Eval/Treat Not Completed: Medical issues which prohibited therapy.  Pt with elevated BP and wheezing, requiring O2 this morning.  Pending chest x-ray.  Will continue to follow acutely and will attempt to see pt again later today is medically appropriate for exertional activity.    [name] PT, DPT 11/29/2017, 9:42 AM

## 2017-11-29 NOTE — Progress Notes (Signed)
Clinical Social Worker (CSW) received SNF consult. PT is recommending outpatient PT. RN case manager aware of above. Please reconsult if future social work needs arise. CSW signing off.   Mckenzie Bove, LCSW [phone]  

## 2017-11-29 NOTE — Consult Note (Signed)
Cardiology Consultation:   Patient ID: EZELLA [name]; [id number]; 1941-11-25   Admit date: 11/28/2017 Date of Consult: 11/29/2017  Primary Care Provider: Remi Haggard, FNP Primary Cardiologist: [name]   Patient Profile:   Caroline [name] is a 76 y.o. female with a hx of nonobstructive CAD by LHC in 0998, chronic diastolic CHF, PAF on Eliquis, bilateral carotid artery disease, CKD stage IV, HTN, HLD, and prior TIA/stroke who is being seen today for the evaluation of acute on chronic diastolic CHF at the request of Dr. Posey Pronto, MD.  History of Present Illness:   Caroline [name] underwent Bow[place]s in 2005 by outside cardiologist that showed only 20-30% stenosis of the mid LCx, otherwise normal coronary arteries. She established with [facility] in 06/2017 for evaluation of irregular heart beat and further evaluation of recent recent strokes/TIA. She wore a 30-day event monitor that showed Afib. She was placed on Cardizem in place of amlodipine and Eliquis was started. Echo in 05/2017 showed EF 65-70%, mild AI, moderate MR, mildly dilated left atrium.    Patient underwent successful left TKA on 12/4. Post-operatively, she was noted to be hypertensive with SBP in the 170-190 range. IM was consulted and they increased the patient's lisinopril to 40 mg daily. She had been on IV fluids at 75 mL/hour since her admission. On 12/5 she reported worsening SOB requiring supplemental oxygen via [place]. CXR showed mild interstitial and alveolar edema. BNP was mildly elevated at 537. BUN 27, SCr 1.57 (baseline ~ 2), K+ 3.7, WBC 17.3, HGB 11.5, PLT 363. She was given IV Lasix 40 mg this morning in place of her PTA PO Lasix 20 mg. For the pat 24 hours she has a documented UOP of 1 L and remains + 2.7 L for the admission. Currently, feels like breathing is improving. No chest pain. BP continues to be elevated in the 338S systolic. Her Eliquis is to be resumed today per IM.     Past Medical History:  Diagnosis Date    . Arthritis   . Chronic diastolic CHF (congestive heart failure) ([place])    a. TTE 05/2017: 65-70%, mild AI, moderate MR, mildly dilated LA  . Chronic kidney disease    STAGE 4  . History of kidney stones 01/2017  . Hypertension   . Hypothyroidism   . PAF (paroxysmal atrial fibrillation) ([place])    a. On Eliquis; b. CHADS2VASc => 8 (CHF, HTN, age x 2, stroke x 2, vascular disease, female)  . Stroke [facility])    TIA 9/17 & 04/06/2017  . Thyroid disease     Past Surgical History:  Procedure Laterality Date  . ABDOMINAL HYSTERECTOMY    . CARDIAC CATHETERIZATION     Negative   . COLONOSCOPY  2010  . PARTIAL HYSTERECTOMY    . TOTAL KNEE ARTHROPLASTY Left 11/28/2017   Procedure: TOTAL KNEE ARTHROPLASTY;  Surgeon: Thornton Park, MD;  Location: [facility] ORS;  Service: Orthopedics;  Laterality: Left;     Home Meds: Prior to Admission medications   Medication Sig Start Date End Date Taking? Authorizing Provider  acetaminophen (TYLENOL) 500 MG tablet Take 1,000 mg by mouth daily as needed (for pain.).   Yes [provider]  allopurinol (ZYLOPRIM) 100 MG tablet Take 100 mg by mouth daily.  09/08/16  Yes [provider]  apixaban (ELIQUIS) 5 MG TABS tablet Take 1 tablet (5 mg total) by mouth 2 (two) times daily. 08/15/17  Yes Gollan, Kathlene November, MD  cyanocobalamin (,VITAMIN B-12,)  1000 MCG/ML injection Inject 1,000 mcg into the muscle every 30 (thirty) days.   Yes [provider]  furosemide (LASIX) 20 MG tablet Take 20 mg by mouth daily.   Yes [provider]  KLOR-CON M10 10 MEQ tablet Take 10 mEq by mouth 2 (two) times daily. 09/15/17  Yes [provider]  levothyroxine (SYNTHROID, LEVOTHROID) 112 MCG tablet Take 112 mcg by mouth daily before breakfast. 09/05/17  Yes [provider]  LINIMENTS EX Apply 1 application topically 3 (three) times daily as needed (for pain). Horse Lininment   Yes [provider]  lisinopril-hydrochlorothiazide  (PRINZIDE,ZESTORETIC) 20-25 MG tablet Take 1 tablet by mouth daily.  04/21/17  Yes [provider]  metoprolol tartrate (LOPRESSOR) 25 MG tablet Take 1 tablet (25 mg total) by mouth 2 (two) times daily. 07/14/17 11/24/18 Yes Gollan, Kathlene November, MD  potassium chloride (MICRO-K) 10 MEQ CR capsule Take 40 mEq by mouth 2 (two) times daily.   Yes [provider]  simvastatin (ZOCOR) 20 MG tablet Take 20 mg by mouth at bedtime.   Yes [provider]  CARTIA XT 120 MG 24 hr capsule TAKE 1 CAPSULE BY MOUTH ONCE DAILY 11/27/17   Minna Merritts, MD  rosuvastatin (CRESTOR) 40 MG tablet Take 1 tablet (40 mg total) by mouth daily. Patient not taking: Reported on 11/24/2017 07/14/17   Minna Merritts, MD    Inpatient Medications: Scheduled Meds: . allopurinol  100 mg Oral Daily  . apixaban  2.5 mg Oral BID  . [START ON 11/30/2017] apixaban  5 mg Oral BID  . celecoxib  200 mg Oral Q12H  . [START ON 12/11/2017] cyanocobalamin  1,000 mcg Intramuscular Q30 days  . diltiazem  120 mg Oral Daily  . docusate sodium  100 mg Oral BID  . furosemide  40 mg Intravenous Q12H  . gabapentin  300 mg Oral TID  . lisinopril  40 mg Oral Daily   And  . hydrochlorothiazide  25 mg Oral Daily  . ipratropium-albuterol  3 mL Nebulization Q4H  . levothyroxine  112 mcg Oral QAC breakfast  . metoprolol tartrate  25 mg Oral BID  . potassium chloride  10 mEq Oral BID  . simvastatin  20 mg Oral QHS   Continuous Infusions: . lactated ringers 50 mL/hr at 11/28/17 0741  . methocarbamol (ROBAXIN)  IV     PRN Meds: acetaminophen **OR** acetaminophen, bisacodyl, hydrALAZINE, menthol-cetylpyridinium **OR** phenol, methocarbamol **OR** methocarbamol (ROBAXIN)  IV, metoCLOPramide **OR** metoCLOPramide (REGLAN) injection, morphine injection, ondansetron **OR** ondansetron (ZOFRAN) IV, oxyCODONE, oxyCODONE, senna-docusate, sodium phosphate  Allergies:  No Known Allergies  Social History:   Social History    Socioeconomic History  . Marital status: Married    Spouse name: Not on file  . Number of children: Not on file  . Years of education: Not on file  . Highest education level: Not on file  Social Needs  . Financial resource strain: Not on file  . Food insecurity - worry: Not on file  . Food insecurity - inability: Not on file  . Transportation needs - medical: Not on file  . Transportation needs - non-medical: Not on file  Occupational History  . Not on file  Tobacco Use  . Smoking status: Never Smoker  . Smokeless tobacco: Never Used  Substance and Sexual Activity  . Alcohol use: Yes    Alcohol/week: 0.6 oz    Types: 1 Glasses of wine per week    Comment: social   .  Drug use: No  . Sexual activity: Not on file  Other Topics Concern  . Not on file  Social History Narrative  . Not on file     Family History:  Family History  Problem Relation Age of Onset  . Heart attack Father 37  . Hypertension Father   . Hyperlipidemia Father   . Heart disease Brother        valvular disease     ROS:  Review of Systems  Constitutional: Positive for malaise/fatigue. Negative for chills, diaphoresis, fever and weight loss.  HENT: Negative for congestion.   Eyes: Negative for discharge and redness.  Respiratory: Positive for shortness of breath and wheezing. Negative for cough, hemoptysis and sputum production.   Cardiovascular: Positive for leg swelling. Negative for chest pain, palpitations, orthopnea, claudication and PND.  Gastrointestinal: Negative for abdominal pain, blood in stool, heartburn, melena, nausea and vomiting.  Genitourinary: Negative for hematuria.  Musculoskeletal: Positive for joint pain and myalgias. Negative for falls.  Skin: Negative for rash.  Neurological: Positive for weakness. Negative for dizziness, tingling, tremors, sensory change, speech change, focal weakness and loss of consciousness.  Endo/Heme/Allergies: Does not bruise/bleed easily.   Psychiatric/Behavioral: Negative for substance abuse. The patient is not nervous/anxious.   All other systems reviewed and are negative.     Physical Exam/Data:   Vitals:   11/29/17 0405 11/29/17 0738 11/29/17 0958 11/29/17 1107  BP: (!) 157/68 (!) 196/65 (!) 178/74   Pulse: 64 71 73   Resp:  16    Temp: 98.5 F (36.9 C) 98.8 F (37.1 C)    TempSrc: Oral Oral    SpO2: 91% 98% 94% 95%  Weight:      Height:        Intake/Output Summary (Last 24 hours) at 11/29/2017 1228 Last data filed at 11/29/2017 1000 Gross per 24 hour  Intake 2625.59 ml  Output 1190 ml  Net 1435.59 ml   Filed Weights   10/26/17 0614 11/28/17 0625  Weight: 155 lb (70.3 kg) 158 lb (71.7 kg)   Body mass index is 28.9 kg/m.   Physical Exam: General: Well developed, well nourished, in no acute distress. Head: Normocephalic, atraumatic, sclera non-icteric, no xanthomas, nares without discharge.  Neck: Negative for carotid bruits. JVD not elevated. Lungs: Bibasilar crackles. Breathing is unlabored. Heart: RRR with S1 S2. No murmurs, rubs, or gallops appreciated. Abdomen: Soft, non-tender, non-distended with normoactive bowel sounds. No hepatomegaly. No rebound/guarding. No obvious abdominal masses. Msk:  Strength and tone appear normal for age. Extremities: No clubbing or cyanosis. No edema. Left LE dressed. Neuro: Alert and oriented X 3. No facial asymmetry. No focal deficit. Moves all extremities spontaneously. Psych:  Responds to questions appropriately with a normal affect.   EKG:  The EKG was personally reviewed and demonstrates: not done Telemetry:  Telemetry was personally reviewed and demonstrates: not on telemetry  Weights: Filed Weights   10/26/17 0614 11/28/17 0625  Weight: 155 lb (70.3 kg) 158 lb (71.7 kg)    Relevant CV Studies: TTE 05/2017: Study Conclusions  - Left ventricle: The cavity size was normal. There was moderate   concentric hypertrophy. Systolic function was vigorous.  The   estimated ejection fraction was in the range of 65% to 70%. - Aortic valve: There was mild regurgitation. - Mitral valve: Calcified annulus. Mildly thickened leaflets .   There was moderate regurgitation. - Left atrium: The atrium was mildly dilated.  [place] 2005: Left main normal. LAD normal. Mid LCx 20-30% stenosis,  ramus normal, RCA normal, EF 60%.   Laboratory Data:  Chemistry Recent Labs  Lab 11/28/17 0636 11/29/17 0605  NA 140 136  K 3.6 3.7  CL  --  103  CO2  --  24  GLUCOSE 117* 121*  BUN  --  27*  CREATININE  --  1.57*  CALCIUM  --  8.7*  GFRNONAA  --  31*  GFRAA  --  36*  ANIONGAP  --  9    No results for input(s): PROT, ALBUMIN, AST, ALT, ALKPHOS, BILITOT in the last 168 hours. Hematology Recent Labs  Lab 11/28/17 0636 11/29/17 0605  WBC  --  17.3*  RBC  --  3.48*  HGB 12.2 11.5*  HCT 36.0 34.5*  MCV  --  99.2  MCH  --  32.9  MCHC  --  33.2  RDW  --  14.3  PLT  --  363   Cardiac EnzymesNo results for input(s): TROPONINI in the last 168 hours. No results for input(s): TROPIPOC in the last 168 hours.  BNP Recent Labs  Lab 11/29/17 0605  BNP 537.0*    DDimer No results for input(s): DDIMER in the last 168 hours.  Radiology/Studies:  Dg Chest Port 1 View  Result Date: 11/29/2017 IMPRESSION: CHF with mild interstitial and early alveolar edema. Thoracic aortic atherosclerosis. Electronically Signed   By: David  Martinique M.D.   On: 11/29/2017 11:04   Dg Knee Left Port  Result Date: 11/28/2017 IMPRESSION: Left total knee replacement components in good position and alignment. No complicating features. Electronically Signed   By: Ivar Drape M.D.   On: 11/28/2017 11:47    Assessment and Plan:   1. Acute on likely chronic diastolic CHF: -She appears volume overloaded -Agree with IV Lasix 40 mg q 12 hours with KCl repletion -Stop HCTZ in an effort to prevent severe hypokalemia  -Echo pending per IM -Likely exacerbated by IV fluids and accelerated  hypertension -Strict Is and Os, daily weights  2. Accelerated HTN: -BP remains elevated in the 846N systolic -Consider adding hydralazine 25 mg tid if BP remains elevated with diuresis  -Continue lisinopril 40 mg daily, IV Lasix, Cardizem, and Lopressor  3. PAF: -Consider placing on telemetry -Cardizem and metoprolol for rate control -Eliquis being resumed today per IM/orthopedics -Currently on 2.5 mg bid for today per orthopedics, she does not meet 2/3 reduced dosing criteria  -Eliquis 5 mg bid to start on 12/6 per orthopedics  -CHADS2VASc at least 8 (CHF, HTN, age x 2, stroke x 2, vascular disease, female)  4. Status post left TKA: -Per orthopedics   5. Leukocytosis: -No obvious signs of infection -Likely inflammatory  -Per IM  6. Acute blood loss anemia: -Secondary to recent surgery -Stable -Per IM  7. CKD Stage IV: -Monitor renal function with diuresis     For questions or updates, please contact [place] Please consult [email or web address] for contact info under Cardiology/STEMI.   Signed, Christell Faith, PA-C [place] Pager: [phone] 11/29/2017, 12:28 PM

## 2017-11-29 NOTE — Progress Notes (Signed)
[place] at [facility]                                                                                                                                                                                  Patient Demographics   Caroline [name], is a 76 y.o. female, DOB - Nov 19, 1941, ZTI:[id number]  Admit date - 11/28/2017   Admitting Physician Thornton Park, MD  Outpatient Primary MD for the patient is Remi Haggard, FNP   LOS - 1  Subjective:  Called by nurse due to patient having hypoxia and wheezing significantly patient states that her symptoms started yesterday.   Review of Systems:   CONSTITUTIONAL: No documented fever. No fatigue, weakness. No weight gain, no weight loss.  EYES: No blurry or double vision.  ENT: No tinnitus. No postnasal drip. No redness of the oropharynx.  RESPIRATORY: No cough, positive wheeze, no hemoptysis.  Positive dyspnea.  CARDIOVASCULAR: No chest pain. No orthopnea. No palpitations. No syncope.  GASTROINTESTINAL: No nausea, no vomiting or diarrhea. No abdominal pain. No melena or hematochezia.  GENITOURINARY: No dysuria or hematuria.  ENDOCRINE: No polyuria or nocturia. No heat or cold intolerance.  HEMATOLOGY: No anemia. No bruising. No bleeding.  INTEGUMENTARY: No rashes. No lesions.  MUSCULOSKELETAL: No arthritis. No swelling. No gout.  NEUROLOGIC: No numbness, tingling, or ataxia. No seizure-type activity.  PSYCHIATRIC: No anxiety. No insomnia. No ADD.    Vitals:   Vitals:   11/29/17 0958 11/29/17 1107 11/29/17 1545 11/29/17 1621  BP: (!) 178/74  (!) 199/59 (!) 199/59  Pulse: 73  79   Resp:   20   Temp:   98.4 F (36.9 C)   TempSrc:   Oral   SpO2: 94% 95% 96%   Weight:      Height:        Wt Readings from Last 3 Encounters:  11/28/17 158 lb (71.7 kg)  10/04/17 158 lb (71.7 kg)  08/15/17 157 lb 8 oz (71.4 kg)     Intake/Output Summary (Last 24 hours) at 11/29/2017 1731 Last data filed at 11/29/2017  1000 Gross per 24 hour  Intake 2190.59 ml  Output 950 ml  Net 1240.59 ml    Physical Exam:   GENERAL: Pleasant-appearing in no apparent distress.  HEAD, EYES, EARS, NOSE AND THROAT: Atraumatic, normocephalic. Extraocular muscles are intact. Pupils equal and reactive to light. Sclerae anicteric. No conjunctival injection. No oro-pharyngeal erythema.  NECK: Supple. There is no jugular venous distention. No bruits, no lymphadenopathy, no thyromegaly.  HEART: Regular rate and rhythm,. No murmurs, no rubs, no clicks.  LUNGS: Bilateral wheezing throughout both lungs no accessory muscle usage ABDOMEN: Soft, flat, nontender,  nondistended. Has good bowel sounds. No hepatosplenomegaly appreciated.  EXTREMITIES: No evidence of any cyanosis, clubbing, or peripheral edema.  +2 pedal and radial pulses bilaterally.  NEUROLOGIC: The patient is alert, awake, and oriented x3 with no focal motor or sensory deficits appreciated bilaterally.  SKIN: Moist and warm with no rashes appreciated.  Psych: Not anxious, depressed LN: No inguinal LN enlargement    Antibiotics   Anti-infectives (From admission, onward)   Start     Dose/Rate Route Frequency Ordered Stop   11/28/17 1400  ceFAZolin (ANCEF) 1 g in dextrose 5 % 50 mL IVPB     1 g 120 mL/hr over 30 Minutes Intravenous Every 6 hours 11/28/17 1250 11/28/17 2237   11/28/17 1245  ceFAZolin (ANCEF) IVPB 1 g/50 mL premix  Status:  Discontinued     1 g 100 mL/hr over 30 Minutes Intravenous Every 6 hours 11/28/17 1236 11/28/17 1250   11/28/17 0701  ceFAZolin (ANCEF) 2-4 GM/100ML-% IVPB    Comments:  Jeanene Erb   : cabinet override      11/28/17 0701 11/28/17 1914   11/28/17 0700  clindamycin (CLEOCIN) 600 MG/50ML IVPB    Comments:  Jeanene Erb   : cabinet override      11/28/17 0700 11/28/17 0844   10/26/17 0600  ceFAZolin (ANCEF) IVPB 2g/100 mL premix     2 g 200 mL/hr over 30 Minutes Intravenous On call to O.R. 10/25/17 2152 10/27/17 0559    10/26/17 0600  ceFAZolin (ANCEF) 2-4 GM/100ML-% IVPB    Comments:  Larita Fife   : cabinet override      10/26/17 0600 10/26/17 1814      Medications   Scheduled Meds: . allopurinol  100 mg Oral Daily  . amLODipine  5 mg Oral Daily  . apixaban  2.5 mg Oral BID  . [START ON 11/30/2017] apixaban  5 mg Oral BID  . celecoxib  200 mg Oral Q12H  . [START ON 12/11/2017] cyanocobalamin  1,000 mcg Intramuscular Q30 days  . docusate sodium  100 mg Oral BID  . furosemide  40 mg Intravenous Q12H  . gabapentin  300 mg Oral TID  . ipratropium-albuterol  3 mL Nebulization Q4H  . levothyroxine  112 mcg Oral QAC breakfast  . lisinopril  40 mg Oral Daily  . metoprolol tartrate  25 mg Oral BID  . nitroGLYCERIN  0.5 inch Topical Q6H  . potassium chloride  10 mEq Oral BID  . simvastatin  20 mg Oral QHS   Continuous Infusions: . methocarbamol (ROBAXIN)  IV     PRN Meds:.acetaminophen **OR** acetaminophen, bisacodyl, cloNIDine, hydrALAZINE, menthol-cetylpyridinium **OR** phenol, methocarbamol **OR** methocarbamol (ROBAXIN)  IV, metoCLOPramide **OR** metoCLOPramide (REGLAN) injection, morphine injection, ondansetron **OR** ondansetron (ZOFRAN) IV, oxyCODONE, oxyCODONE, senna-docusate, sodium phosphate   Data Review:   Micro Results No results found for this or any previous visit (from the past 240 hour(s)).  Radiology Reports Dg Chest Port 1 View  Result Date: 11/29/2017 CLINICAL DATA:  Shortness of breath. The patient underwent knee replacement yesterday. History of hypertension, peripheral vascular disease and coronary artery disease, previous CVA. EXAM: PORTABLE CHEST 1 VIEW COMPARISON:  None in PACs FINDINGS: The lungs are adequately inflated. The interstitial markings are increased. There is patchy airspace opacity in the left perihilar and right infrahilar regions. The pulmonary vascularity is engorged. The cardiac silhouette is enlarged. There is no pleural effusion. There is calcification  in the wall of the aortic arch. There are degenerative changes of the right  AC joint. IMPRESSION: CHF with mild interstitial and early alveolar edema. Thoracic aortic atherosclerosis. Electronically Signed   By: David  Martinique M.D.   On: 11/29/2017 11:04   Dg Knee Left Port  Result Date: 11/28/2017 CLINICAL DATA:  Post left knee replacement EXAM: PORTABLE LEFT KNEE - 1-2 VIEW COMPARISON:  None. FINDINGS: Portable views of the left knee show the femoral and tibial components of the left total knee replacement to be in good position and alignment. No complicating features are seen. A small amount air is noted within soft tissues postoperatively and surgical drains are present. IMPRESSION: Left total knee replacement components in good position and alignment. No complicating features. Electronically Signed   By: Ivar Drape M.D.   On: 11/28/2017 11:47     CBC Recent Labs  Lab 11/28/17 0636 11/29/17 0605  WBC  --  17.3*  HGB 12.2 11.5*  HCT 36.0 34.5*  PLT  --  363  MCV  --  99.2  MCH  --  32.9  MCHC  --  33.2  RDW  --  14.3    Chemistries  Recent Labs  Lab 11/28/17 0636 11/29/17 0605  NA 140 136  K 3.6 3.7  CL  --  103  CO2  --  24  GLUCOSE 117* 121*  BUN  --  27*  CREATININE  --  1.57*  CALCIUM  --  8.7*   ------------------------------------------------------------------------------------------------------------------ estimated creatinine clearance is 27.8 mL/min (A) (by C-G formula based on SCr of 1.57 mg/dL (H)). ------------------------------------------------------------------------------------------------------------------ No results for input(s): HGBA1C in the last 72 hours. ------------------------------------------------------------------------------------------------------------------ No results for input(s): CHOL, HDL, LDLCALC, TRIG, CHOLHDL, LDLDIRECT in the last 72  hours. ------------------------------------------------------------------------------------------------------------------ No results for input(s): TSH, T4TOTAL, T3FREE, THYROIDAB in the last 72 hours.  Invalid input(s): FREET3 ------------------------------------------------------------------------------------------------------------------ No results for input(s): VITAMINB12, FOLATE, FERRITIN, TIBC, IRON, RETICCTPCT in the last 72 hours.  Coagulation profile No results for input(s): INR, PROTIME in the last 168 hours.  No results for input(s): DDIMER in the last 72 hours.  Cardiac Enzymes No results for input(s): CKMB, TROPONINI, MYOGLOBIN in the last 168 hours.  Invalid input(s): CK ------------------------------------------------------------------------------------------------------------------ Invalid input(s): POCBNP    Assessment & Plan   Assessment/Plan:  #Acute CHF versus fluid overload I started patient on IV Lasix Echocardiogram of the heart is pending Family requesting her primary cardiologist to see   # Accelerated hypertension Continue higher dose lisinopril Continue hydrochlorothiazide Continue Cardizem, metoprolol I will also add IV hydralazine, as needed clonidine Due to CHF I will place patient on Nitropatch as well  #Paroxysmal atrial fibrillation rate controlled Continue Cardizem CD 120 mg and eliquis  #Hyperlipidemia continue Zocor  #Left knee arthroplasty postop day 0 Pain management per ortho Continue Robaxin and morphine as needed for severe pain  #Chronic kidney disease Avoid nephrotoxins follow renal function closely         Code Status Orders  (From admission, onward)        Start     Ordered   11/28/17 1237  Full code  Continuous     11/28/17 1236    Code Status History    Date Active Date Inactive Code Status Order ID Comments User Context   06/06/2017 18:02 06/07/2017 18:12 Full Code [id number]  Nicholes Mango, MD ED     Advance Directive Documentation     Most Recent Value  Type of Advance Directive  Healthcare Power of Attorney  Pre-existing out of facility DNR order (yellow form or pink MOST form)  No data  "MOST" Form in Place?  No data             DVT Prophylaxis on full dose anticoagulation Lab Results  Component Value Date   PLT 363 11/29/2017     Time Spent in minutes 35 minutes  Greater than 50% of time spent in care coordination and counseling patient regarding the condition and plan of care.   Dustin Flock M.D on 11/29/2017 at 5:31 PM  Between 7am to 6pm - Pager - [phone]  After 6pm go to [email or web address] - password [email or web address] [place]   Office  [phone]

## 2017-11-29 NOTE — Evaluation (Signed)
Physical Therapy Evaluation Patient Details Name: Caroline [name] MRN: [id number] DOB: [birth year] Today's Date: 11/29/2017   History of Present Illness  admitted for acute hospitalization status post L TKR (11/28/17).  Clinical Impression  Caroline [name] made good progress with mobility today.  She currently requires min guard assist with transfers for safety.  She ambulated 35 ft with RW with partial L knee buckle noted x3, will trial use of KI next session.  Pt has strong support at home from her husband. Pt will benefit from continued skilled PT services to increase functional independence and safety.    Follow Up Recommendations Outpatient PT;Supervision for mobility/OOB    Equipment Recommendations       Recommendations for Other Services       Precautions / Restrictions Precautions Precautions: Fall Precaution Booklet Issued: No Precaution Comments: Instructed pt in no pillow under knee Required Braces or Orthoses: Knee Immobilizer - Left Knee Immobilizer - Left: Other (comment)(on when resting/in bed) Restrictions Weight Bearing Restrictions: Yes LLE Weight Bearing: Weight bearing as tolerated      Mobility  Bed Mobility Overal bed mobility: Needs Assistance Bed Mobility: Supine to Sit     Supine to sit: Min guard;HOB elevated     General bed mobility comments: Increased time and use of bed rail.  Cues for sequencing.   Transfers Overall transfer level: Needs assistance Equipment used: Rolling walker (2 wheeled) Transfers: Sit to/from Stand Sit to Stand: Min guard         General transfer comment: Cues for safe hand placement and technique.  Pt slow to stand.  Well controlled descent to sit.   Ambulation/Gait Ambulation/Gait assistance: Min assist Ambulation Distance (Feet): 70 Feet Assistive device: Rolling walker (2 wheeled) Gait Pattern/deviations: Decreased step length - right;Decreased stance time - left;Decreased weight shift to left;Decreased stride  length;Antalgic;Trunk flexed Gait velocity: decreased Gait velocity interpretation: <1.8 ft/sec, indicative of risk for recurrent falls General Gait Details: Pt requires max verbal cues for proper RW positioning as she has the tendency to push it to far ahead.  Min assist provided at times for RW management.  Partial L knee buckle noted x3 when ambulating but no LOB.  SpO2 down to 88% on 2L O2.  Once increased to 3L O2 SpO2 remained at or above 94% for remainder of session.   Stairs            Wheelchair Mobility    Modified Rankin (Stroke Patients Only)       Balance Overall balance assessment: Needs assistance Sitting-balance support: No upper extremity supported;Feet supported Sitting balance-Leahy Scale: Good     Standing balance support: Bilateral upper extremity supported;During functional activity Standing balance-Leahy Scale: Poor Standing balance comment: Pt relies on UE support for static and dynamic activities                             Pertinent Vitals/Pain Pain Assessment: Faces Pain Score: 8  Faces Pain Scale: Hurts little more Pain Location: L knee Pain Descriptors / Indicators: Guarding Pain Intervention(s): Limited activity within patient's tolerance;Monitored during session;Ice applied    Home Living Family/patient expects to be discharged to:: Private residence Living Arrangements: Spouse/significant other Available Help at Discharge: Family;Available 24 hours/day Type of Home: House Home Access: Stairs to enter Entrance Stairs-Rails: Left Entrance Stairs-Number of Steps: 3 Home Layout: One level Home Equipment: Adaptive equipment      Prior Function Level of Independence: Independent  Comments: Pt indep with ADL, IADL, household and community mobilization; + driving; no falls endorsed in past 12 months.     Hand Dominance   Dominant Hand: Right    Extremity/Trunk Assessment   Upper Extremity Assessment Upper  Extremity Assessment: Overall WFL for tasks assessed    Lower Extremity Assessment Lower Extremity Assessment: Defer to PT evaluation(anticipated LLE post-op weakness)    Cervical / Trunk Assessment Cervical / Trunk Assessment: Normal  Communication   Communication: HOH  Cognition Arousal/Alertness: Awake/alert Behavior During Therapy: WFL for tasks assessed/performed Overall Cognitive Status: Within Functional Limits for tasks assessed                                        General Comments      Exercises Total Joint Exercises Ankle Circles/Pumps: AROM;Both;10 reps;Supine Quad Sets: Strengthening;Both;10 reps;Supine Straight Leg Raises: Strengthening;Left;10 reps;Supine Long Arc Quad: AROM;Left;5 reps;Seated;Strengthening Knee Flexion: AAROM;Left;5 reps;Seated Goniometric ROM: 5 to 90 deg   Assessment/Plan    PT Assessment    PT Problem List         PT Treatment Interventions      PT Goals (Current goals can be found in the Care Plan section)  Acute Rehab PT Goals Patient Stated Goal: to get stronger and go home PT Goal Formulation: With patient Time For Goal Achievement: 12/12/17 Potential to Achieve Goals: Good    Frequency BID   Barriers to discharge        Co-evaluation               AM-PAC PT "6 Clicks" Daily Activity  Outcome Measure Difficulty turning over in bed (including adjusting bedclothes, sheets and blankets)?: Unable Difficulty moving from lying on back to sitting on the side of the bed? : Unable Difficulty sitting down on and standing up from a chair with arms (e.g., wheelchair, bedside commode, etc,.)?: A Lot Help needed moving to and from a bed to chair (including a wheelchair)?: A Little Help needed walking in hospital room?: A Little Help needed climbing 3-5 steps with a railing? : A Little 6 Click Score: 13    End of Session Equipment Utilized During Treatment: Gait belt;Oxygen Activity Tolerance: Patient  tolerated treatment well;Patient limited by fatigue Patient left: in chair;with call bell/phone within reach;with chair alarm set;Other (comment)(with towel roll and polar care) Nurse Communication: Mobility status;Other (comment)(SpO2) PT Visit Diagnosis: Muscle weakness (generalized) (M62.81);Difficulty in walking, not elsewhere classified (R26.2);Pain Pain - Right/Left: Left Pain - part of body: Knee    Time: 6283-6629 PT Time Calculation (min) (ACUTE ONLY): 42 min   Charges:     PT Treatments $Gait Training: 8-22 mins $Therapeutic Exercise: 8-22 mins $Therapeutic Activity: 8-22 mins   PT G Codes:        [name] PT, DPT 11/29/2017, 1:43 PM

## 2017-11-29 NOTE — NC FL2 (Signed)
[place] LEVEL OF CARE SCREENING TOOL     IDENTIFICATION  Patient Name: Caroline [name] Birthdate: 05-Jun-1941 Sex: female Admission Date (Current Location): 11/28/2017  [place] and [place] Number:  Engineering geologist and Address:  [facility], [address], [place], [place] [address]      Provider Number: [id number]  Attending Physician Name and Address:  Thornton Park, MD  Relative Name and Phone Number:       Current Level of Care: Hospital Recommended Level of Care: [place] Prior Approval Number:    Date Approved/Denied:   PASRR Number: [id number])  Discharge Plan: SNF    Current Diagnoses: Patient Active Problem List   Diagnosis Date Noted  . S/P total knee arthroplasty, left 11/28/2017  . Bilateral carotid artery stenosis 08/14/2017  . Paroxysmal atrial fibrillation ([place]) 07/14/2017  . CVA (cerebral vascular accident) ([place]) 06/07/2017  . TIA (transient ischemic attack) 10/18/2016  . Carotid stenosis 02/17/2014  . HTN (hypertension) 02/17/2014  . Hyperlipidemia 02/17/2014  . Coronary disease 02/17/2014  . Abnormal echocardiogram 02/17/2014    Orientation RESPIRATION BLADDER Height & Weight     Self, Time, Situation, Place  O2(3 Liters Oxygen. ) Continent Weight: 158 lb (71.7 kg) Height:  5\' 2"  (157.5 cm)  BEHAVIORAL SYMPTOMS/MOOD NEUROLOGICAL BOWEL NUTRITION STATUS      Continent Diet(Diet: 2 Grams Sodium. )  AMBULATORY STATUS COMMUNICATION OF NEEDS Skin   Extensive Assist Verbally Surgical wounds, Wound Vac(Incision: Left Knee, provena wound vac. )                       Personal Care Assistance Level of Assistance  Bathing, Feeding, Dressing Bathing Assistance: Limited assistance Feeding assistance: Independent Dressing Assistance: Limited assistance     Functional Limitations Info  Sight, Hearing, Speech Sight Info: Adequate Hearing Info: Adequate Speech Info: Adequate     SPECIAL CARE FACTORS FREQUENCY  PT (By licensed PT), OT (By licensed OT)     PT Frequency: (5) OT Frequency: (5)            Contractures      Additional Factors Info  Code Status, Allergies Code Status Info: (Full Code. ) Allergies Info: (No Known Allergies. )           Current Medications (11/29/2017):  This is the current hospital active medication list Current Facility-Administered Medications  Medication Dose Route Frequency Provider Last Rate Last Dose  . acetaminophen (TYLENOL) tablet 650 mg  650 mg Oral Q4H PRN Thornton Park, MD   650 mg at 11/28/17 1524   Or  . acetaminophen (TYLENOL) suppository 650 mg  650 mg Rectal Q4H PRN Thornton Park, MD      . allopurinol (ZYLOPRIM) tablet 100 mg  100 mg Oral Daily Thornton Park, MD   100 mg at 11/29/17 0953  . apixaban (ELIQUIS) tablet 2.5 mg  2.5 mg Oral BID Thornton Park, MD   2.5 mg at 11/29/17 0865  . [START ON 11/30/2017] apixaban (ELIQUIS) tablet 5 mg  5 mg Oral BID Thornton Park, MD      . bisacodyl (DULCOLAX) suppository 10 mg  10 mg Rectal Daily PRN Thornton Park, MD      . celecoxib (CELEBREX) capsule 200 mg  200 mg Oral Q12H Thornton Park, MD   200 mg at 11/29/17 7846  . [START ON 12/11/2017] cyanocobalamin ((VITAMIN B-12)) injection 1,000 mcg  1,000 mcg Intramuscular Q30 days Thornton Park, MD      .  diltiazem (CARDIZEM CD) 24 hr capsule 120 mg  120 mg Oral Daily Thornton Park, MD   120 mg at 11/29/17 0953  . docusate sodium (COLACE) capsule 100 mg  100 mg Oral BID Thornton Park, MD   100 mg at 11/29/17 0825  . furosemide (LASIX) injection 40 mg  40 mg Intravenous Q12H Dustin Flock, MD   40 mg at 11/29/17 1127  . gabapentin (NEURONTIN) capsule 300 mg  300 mg Oral TID Thornton Park, MD   300 mg at 11/29/17 2947  . hydrALAZINE (APRESOLINE) injection 10 mg  10 mg Intravenous Q6H PRN Dustin Flock, MD      . lisinopril (PRINIVIL,ZESTRIL) tablet 40 mg  40 mg Oral Daily Gouru,  Aruna, MD   40 mg at 11/29/17 6546   And  . hydrochlorothiazide (HYDRODIURIL) tablet 25 mg  25 mg Oral Daily Gouru, Aruna, MD   25 mg at 11/29/17 0825  . ipratropium-albuterol (DUONEB) 0.5-2.5 (3) MG/3ML nebulizer solution 3 mL  3 mL Nebulization Q4H Dustin Flock, MD   3 mL at 11/29/17 1107  . lactated ringers infusion   Intravenous Continuous [place], Amy, MD 50 mL/hr at 11/28/17 0741    . levothyroxine (SYNTHROID, LEVOTHROID) tablet 112 mcg  112 mcg Oral QAC breakfast Thornton Park, MD   112 mcg at 11/29/17 0740  . menthol-cetylpyridinium (CEPACOL) lozenge 3 mg  1 lozenge Oral PRN Thornton Park, MD       Or  . phenol (CHLORASEPTIC) mouth spray 1 spray  1 spray Mouth/Throat PRN Thornton Park, MD      . methocarbamol (ROBAXIN) tablet 500 mg  500 mg Oral Q6H PRN Thornton Park, MD       Or  . methocarbamol (ROBAXIN) 500 mg in dextrose 5 % 50 mL IVPB  500 mg Intravenous Q6H PRN Thornton Park, MD      . metoCLOPramide (REGLAN) tablet 5-10 mg  5-10 mg Oral Q8H PRN Thornton Park, MD       Or  . metoCLOPramide (REGLAN) injection 5-10 mg  5-10 mg Intravenous Q8H PRN Thornton Park, MD      . metoprolol tartrate (LOPRESSOR) tablet 25 mg  25 mg Oral BID Thornton Park, MD   25 mg at 11/29/17 0825  . morphine 2 MG/ML injection 2 mg  2 mg Intravenous Q2H PRN Thornton Park, MD      . ondansetron [facility]) tablet 4 mg  4 mg Oral Q6H PRN Thornton Park, MD       Or  . ondansetron [facility]) injection 4 mg  4 mg Intravenous Q6H PRN Thornton Park, MD      . oxyCODONE (Oxy IR/ROXICODONE) immediate release tablet 10 mg  10 mg Oral Q3H PRN Thornton Park, MD   10 mg at 11/29/17 0827  . oxyCODONE (Oxy IR/ROXICODONE) immediate release tablet 5 mg  5 mg Oral Q3H PRN Thornton Park, MD   5 mg at 11/29/17 1135  . potassium chloride (K-DUR,KLOR-CON) CR tablet 10 mEq  10 mEq Oral BID Thornton Park, MD   10 mEq at 11/29/17 0824  . senna-docusate (Senokot-S) tablet 1 tablet  1 tablet  Oral QHS PRN Thornton Park, MD      . simvastatin (ZOCOR) tablet 20 mg  20 mg Oral QHS Thornton Park, MD   20 mg at 11/28/17 2205  . sodium phosphate (FLEET) 7-19 GM/118ML enema 1 enema  1 enema Rectal Once PRN Thornton Park, MD         Discharge Medications: Please see discharge summary  for a list of discharge medications.  Relevant Imaging Results:  Relevant Lab Results:   Additional Information (SSN: [id number])  Kristinia Leavy, Veronia Beets, LCSW

## 2017-11-29 NOTE — Progress Notes (Signed)
Patient BP 199/59, MD notified and placed orders for PRN BD meds.   Deri Fuelling, RN

## 2017-11-29 NOTE — Progress Notes (Signed)
Patient BP 196/65, suddenly needing 3LO2 oxygen saturation 85% on room air. Patient wheezing on auscultation. MD notified. Ordered chest xray.   Deri Fuelling, RN

## 2017-11-29 NOTE — Progress Notes (Signed)
Patient BP 178/74 after taking BP medications. MD notified. No new orders at this time.   Deri Fuelling, RN

## 2017-11-29 NOTE — Evaluation (Signed)
Occupational Therapy Evaluation Patient Details Name: ENSLEY BLAS MRN: [id number] DOB: [birth year] Today's Date: 11/29/2017    History of Present Illness admitted for acute hospitalization status post L TKR (11/28/17).   Clinical Impression   Pt seen for OT evaluation this date. Pt independent prior to admission and eager to return to PLOF. Spouse and daughter present for evaluation. Good family support set up for return home. Currently, pt requires min assist for LB ADL tasks, and min assist for mobility. On 3L O2 this date due to pulmonary edema (pt not on any supplemental O2 at home). No noted SOB during session, O2 96%, HR 73. Pt/family educated in AE/DME and polar care mgt, all verbalized understanding. Pt somewhat fatigued this afternoon, long sitting in bed after session to eat lunch. Pt will benefit from skilled OT services while in the hospital to support maximal return to PLOF and minimize risk of falls and caregiver burden. No follow up OT indicated at this time upon discharge.     Follow Up Recommendations  No OT follow up    Equipment Recommendations  3 in 1 bedside commode    Recommendations for Other Services       Precautions / Restrictions Precautions Precautions: Fall Precaution Booklet Issued: No Precaution Comments: Instructed pt in no pillow under knee Required Braces or Orthoses: Knee Immobilizer - Left Knee Immobilizer - Left: Other (comment)(on when resting/in bed) Restrictions Weight Bearing Restrictions: Yes LLE Weight Bearing: Weight bearing as tolerated      Mobility Bed Mobility Overal bed mobility: Needs Assistance Bed Mobility: Supine to Sit     Supine to sit: Min guard;HOB elevated     General bed mobility comments: increased time/effort, use of bed rails  Transfers Overall transfer level: Needs assistance Equipment used: Rolling walker (2 wheeled) Transfers: Sit to/from Stand Sit to Stand: Min guard         General transfer  comment: VC for safe hand placement    Balance Overall balance assessment: Needs assistance Sitting-balance support: No upper extremity supported;Feet supported Sitting balance-Leahy Scale: Good     Standing balance support: Bilateral upper extremity supported;During functional activity Standing balance-Leahy Scale: Poor                           ADL either performed or assessed with clinical judgement   ADL Overall ADL's : Needs assistance/impaired Eating/Feeding: Sitting;Modified independent   Grooming: Sitting;Modified independent   Upper Body Bathing: Supervision/ safety;Sitting   Lower Body Bathing: Minimal assistance;Sit to/from stand   Upper Body Dressing : Set up;Sitting   Lower Body Dressing: Minimal assistance;Sit to/from stand Lower Body Dressing Details (indicate cue type and reason): pt/family educated in use of AE for LB Dressing to improve functional independence. Toilet Transfer: RW;Ambulation;Min guard;Comfort height toilet           Functional mobility during ADLs: Minimal assistance;Cueing for safety;Rolling walker       Vision Baseline Vision/History: Wears glasses Wears Glasses: Distance only Patient Visual Report: No change from baseline Vision Assessment?: No apparent visual deficits     Perception     Praxis      Pertinent Vitals/Pain Pain Assessment: Faces Faces Pain Scale: Hurts little more Pain Location: L knee Pain Descriptors / Indicators: Guarding Pain Intervention(s): Limited activity within patient's tolerance;Monitored during session;Ice applied     Hand Dominance Right   Extremity/Trunk Assessment Upper Extremity Assessment Upper Extremity Assessment: Overall WFL for tasks assessed   Lower  Extremity Assessment Lower Extremity Assessment: Defer to PT evaluation(anticipated LLE post-op weakness)   Cervical / Trunk Assessment Cervical / Trunk Assessment: Normal   Communication Communication Communication:  HOH   Cognition Arousal/Alertness: Awake/alert Behavior During Therapy: WFL for tasks assessed/performed Overall Cognitive Status: Within Functional Limits for tasks assessed                                     General Comments       Exercises Other Exercises Other Exercises: Pt/family educated in polar care mgt, AE/DME, and falls prevention.   Shoulder Instructions      Home Living Family/patient expects to be discharged to:: Private residence Living Arrangements: Spouse/significant other Available Help at Discharge: Family;Available 24 hours/day Type of Home: House Home Access: Stairs to enter [employer] of Steps: 3 Entrance Stairs-Rails: Left Home Layout: One level     Bathroom Shower/Tub: Teacher, early years/pre: Handicapped height Bathroom Accessibility: Yes How Accessible: Accessible via walker Home Equipment: Radiation protection practitioner Equipment: Reacher        Prior Functioning/Environment Level of Independence: Independent        Comments: Pt indep with ADL, IADL, household and community mobilization; + driving; no falls endorsed in past 12 months.        OT Problem List: Decreased strength;Decreased knowledge of use of DME or AE;Decreased knowledge of precautions;Decreased range of motion;Decreased activity tolerance;Cardiopulmonary status limiting activity;Pain;Impaired balance (sitting and/or standing);Decreased safety awareness      OT Treatment/Interventions: Self-care/ADL training;Therapeutic activities;DME and/or AE instruction;Patient/family education;Therapeutic exercise    OT Goals(Current goals can be found in the care plan section) Acute Rehab OT Goals Patient Stated Goal: to get stronger and go home OT Goal Formulation: With patient/family Time For Goal Achievement: 12/13/17 Potential to Achieve Goals: Good  OT Frequency: Min 1X/week   Barriers to D/C:            Co-evaluation               AM-PAC PT "6 Clicks" Daily Activity     Outcome Measure Help from another person eating meals?: None Help from another person taking care of personal grooming?: None Help from another person toileting, which includes using toliet, bedpan, or urinal?: A Little Help from another person bathing (including washing, rinsing, drying)?: A Little Help from another person to put on and taking off regular upper body clothing?: None Help from another person to put on and taking off regular lower body clothing?: A Little 6 Click Score: 21   End of Session    Activity Tolerance: Patient tolerated treatment well Patient left: in bed;with call bell/phone within reach;with bed alarm set;with family/visitor present;Other (comment)(polar care in place)  OT Visit Diagnosis: Other abnormalities of gait and mobility (R26.89)                Time: 4128-7867 OT Time Calculation (min): 14 min Charges:  OT General Charges $OT Visit: 1 Visit OT Evaluation $OT Eval Low Complexity: 1 Low OT Treatments $Self Care/Home Management : 8-22 mins G-Codes: OT G-codes **NOT FOR INPATIENT CLASS** Functional Assessment Tool Used: AM-PAC 6 Clicks Daily Activity;Clinical judgement Functional Limitation: Self care Self Care Current Status (E7209): At least 1 percent but less than 20 percent impaired, limited or restricted Self Care Goal Status (O7096): At least 1 percent but less than 20 percent impaired, limited or restricted   Jeni Salles, MPH, MS,  OTR/L ascom [phone] 11/29/17, 1:56 PM

## 2017-11-29 NOTE — Progress Notes (Signed)
Patient in room on [facility], decided to get up by herself after calling for help to get back to bed and fell on her bottom. Patient denies pain, did not have any contact with surgery site. Family member was in room during fall. Family member and patient was taught this morning to never get up alone by RN and PT. MD notified. No new orders at this time. RN will fill out safety zone portal.   Deri Fuelling, RN

## 2017-11-29 NOTE — Care Management Note (Addendum)
Case Management Note  Patient Details  Name: Caroline [name] MRN: [id number] Date of Birth: May 24, 1941  Subjective/Objective:  Met with patient to discuss Pt recommendations of outpatient PT. Her first appointment is scheduled for Dec. 10 @ 1pm. Updated patient's spouse of date and time. Placed on follow up for AVS. She will need a walker. Ordered from [facility]. Patient is chronically on Eliquis.                  Action/Plan: Discharge anticipated tomorrow.   Expected Discharge Date:  11/30/17               Expected Discharge Plan:  OP Rehab  In-House Referral:     Discharge planning Services  CM Consult  Post Acute Care Choice:  Durable Medical Equipment Choice offered to:  Patient, Spouse  DME Arranged:  Walker rolling DME Agency:  [place]:    [facility] Agency:     Status of Service:  In process, will continue to follow  If discussed at Long Length of Stay Meetings, dates discussed:    Additional Comments:  Jolly Mango, RN 11/29/2017, 1:42 PM

## 2017-11-29 NOTE — Progress Notes (Signed)
Physical Therapy Treatment Patient Details Name: Caroline [name] MRN: [id number] DOB: Jul 19, 1941 Today's Date: 11/29/2017    History of Present Illness admitted for acute hospitalization status post L TKR (11/28/17).    PT Comments    Just prior to PT arrival the pt fell from her [facility] to the floor.  RN staff assisted pt from flor to chair.  Pt reported 0/10 pain throughout entire body.  This PT assisted pt from chair to recliner chair with chair alarm pad.  Pt required cues for safety with transfer and min guard assist for sit<>stand transfer and to ambulate 5 ft to recliner chair.  Pt recalled proper sequencing technique using RW and did not require assist managing RW this session.  Session limited due to fall prior to session, RN checking vitals at end of PT session.  Pt will benefit from continued skilled PT services to increase functional independence and safety.  Pt and pt's daughter verbalized understanding of no OOB or chair without assist from hospital staff.      Follow Up Recommendations  Outpatient PT;Supervision for mobility/OOB     Equipment Recommendations       Recommendations for Other Services       Precautions / Restrictions Precautions Precautions: Fall;Knee Precaution Booklet Issued: No Precaution Comments: Pt with fall on 12/5 just before PT arrival. Reminded pt of no pillow under knee Required Braces or Orthoses: Knee Immobilizer - Left Knee Immobilizer - Left: Other (comment)(on when resting in bed/chair) Restrictions Weight Bearing Restrictions: Yes LLE Weight Bearing: Weight bearing as tolerated    Mobility  Bed Mobility Overal bed mobility: Needs Assistance Bed Mobility: Supine to Sit     Supine to sit: Min guard;HOB elevated     General bed mobility comments: Pt sitting in chair at start and end of PT session  Transfers Overall transfer level: Needs assistance Equipment used: Rolling walker (2 wheeled) Transfers: Sit to/from Stand Sit to  Stand: Min guard         General transfer comment: Cues for safe hand placement.  Pt demonstrates mild instability once standing upright but no LOB, able to steady with RW.    Ambulation/Gait Ambulation/Gait assistance: Min guard Ambulation Distance (Feet): 5 Feet Assistive device: Rolling walker (2 wheeled) Gait Pattern/deviations: Decreased step length - right;Decreased stance time - left;Decreased weight shift to left;Decreased stride length;Antalgic;Trunk flexed Gait velocity: decreased Gait velocity interpretation: Below normal speed for age/gender General Gait Details: Pt ambulated 5 ft from chair to recliner chair and denied pain while ambulating.  SpO2 down as low as 86% on 3L O2.  Close min guard assist for safety.  Pt with proper sequencing which she recalled from this morning's session.    Stairs            Wheelchair Mobility    Modified Rankin (Stroke Patients Only)       Balance Overall balance assessment: Needs assistance Sitting-balance support: No upper extremity supported;Feet supported Sitting balance-Leahy Scale: Good     Standing balance support: Bilateral upper extremity supported;During functional activity Standing balance-Leahy Scale: Poor Standing balance comment: Pt relies on UE support for static and dynamic activities                            Cognition Arousal/Alertness: Awake/alert Behavior During Therapy: WFL for tasks assessed/performed Overall Cognitive Status: Within Functional Limits for tasks assessed  Exercises Total Joint Exercises Ankle Circles/Pumps: AROM;Both;10 reps;Seated Quad Sets: Strengthening;Both;10 reps;Seated Hip ABduction/ADduction: AAROM;Left;10 reps;Seated Straight Leg Raises: Strengthening;Left;10 reps;Seated Long Arc Quad: AROM;Left;5 reps;Seated;Strengthening Knee Flexion: AAROM;Left;5 reps;Seated Goniometric ROM: 5 to 90 deg Marching in  Standing: Both;10 reps;Seated Other Exercises Other Exercises: Pt/family educated in polar care mgt, AE/DME, and falls prevention.    General Comments General comments (skin integrity, edema, etc.): Prior to PT arrival pt had fall from [facility] to bottom which she said was not a hard impact but rather she slid down to the floor.  This occurred when reaching for her water cup while sitting on BSC.  Pt denies any pain, reporting her pain as 0/10 throughout her entire body.  Nursing staff assisted pt to chair from floor.  This PT assisted pt from chair to recliner chair where the RN proceeded to take vitals.  Pt denied any pain with ambulation.  SpO2 down as low as 86% on 3L O2 ambulating 5 ft from chair to recliner chair.  SpO2 quickly up to high 90s once resting in chair on 3L O2.       Pertinent Vitals/Pain Pain Assessment: 0-10 Pain Score: 0-No pain Faces Pain Scale: Hurts little more Pain Location: L knee Pain Descriptors / Indicators: Guarding Pain Intervention(s): Monitored during session;Limited activity within patient's tolerance    Home Living Family/patient expects to be discharged to:: Private residence Living Arrangements: Spouse/significant other Available Help at Discharge: Family;Available 24 hours/day Type of Home: House Home Access: Stairs to enter Entrance Stairs-Rails: Left Home Layout: One level Home Equipment: Adaptive equipment      Prior Function Level of Independence: Independent      Comments: Pt indep with ADL, IADL, household and community mobilization; + driving; no falls endorsed in past 12 months.   PT Goals (current goals can now be found in the care plan section) Acute Rehab PT Goals Patient Stated Goal: to get stronger and go home PT Goal Formulation: With patient Time For Goal Achievement: 12/12/17 Potential to Achieve Goals: Good Progress towards PT goals: Not progressing toward goals - comment(due to fall)    Frequency    BID      PT Plan  Current plan remains appropriate    Co-evaluation              AM-PAC PT "6 Clicks" Daily Activity  Outcome Measure  Difficulty turning over in bed (including adjusting bedclothes, sheets and blankets)?: Unable Difficulty moving from lying on back to sitting on the side of the bed? : Unable Difficulty sitting down on and standing up from a chair with arms (e.g., wheelchair, bedside commode, etc,.)?: A Lot Help needed moving to and from a bed to chair (including a wheelchair)?: A Little Help needed walking in hospital room?: A Little Help needed climbing 3-5 steps with a railing? : A Little 6 Click Score: 13    End of Session Equipment Utilized During Treatment: Gait belt;Oxygen Activity Tolerance: Patient tolerated treatment well Patient left: in chair;with call bell/phone within reach;with chair alarm set;with family/visitor present;with nursing/sitter in room Nurse Communication: Mobility status;Other (comment)(SpO2; no pain with ambualtion/WBing) PT Visit Diagnosis: Muscle weakness (generalized) (M62.81);Difficulty in walking, not elsewhere classified (R26.2);Pain Pain - Right/Left: Left Pain - part of body: Knee     Time: 1530-1601 PT Time Calculation (min) (ACUTE ONLY): 31 min  Charges:  $Gait Training: 8-22 mins $Therapeutic Exercise: 23-37 mins $Therapeutic Activity: 8-22 mins  G Codes:       [name] PT, DPT 11/29/2017, 4:17 PM

## 2017-11-29 NOTE — Progress Notes (Signed)
OT Cancellation Note  Patient Details Name: Caroline [name] MRN: [id number] DOB: 27-Jan-1941   Cancelled Treatment:    Reason Eval/Treat Not Completed: Medical issues which prohibited therapy(Pt. with elevated BP, and sudden SOB requiring 3LO2. Pt. is pending  a chest x-ray.  Will continue to monitor, and eval when appropriate.)  Harrel Carina, MS, OTR/L 11/29/2017, 9:57 AM

## 2017-11-29 NOTE — Progress Notes (Signed)
Subjective:  POD #1 s/p Left TKA.  Patient reports left knee pain as mild.  Patient is doing physical therapy this morning. She has hypertension postoperatively.  The hospitalist has been consulted for this. Patient had wheezing on morning exam by the nurse. Chest x-ray shows pulmonary edema.  Patient is receiving Lasix.  She denies shortness of breath or chest pain. Patient on 3 L of nasal cannula.  Objective:   VITALS:   Vitals:   11/29/17 0405 11/29/17 0738 11/29/17 0958 11/29/17 1107  BP: (!) 157/68 (!) 196/65 (!) 178/74   Pulse: 64 71 73   Resp:  16    Temp: 98.5 F (36.9 C) 98.8 F (37.1 C)    TempSrc: Oral Oral    SpO2: 91% 98% 94% 95%  Weight:      Height:        PHYSICAL EXAM: Left knee: Neurovascular intact Sensation intact distally Intact pulses distally Dorsiflexion/Plantar flexion intact Incision: dressing C/D/I Compartment soft  LABS  Results for orders placed or performed during the hospital encounter of 11/28/17 (from the past 24 hour(s))  CBC     Status: Abnormal   Collection Time: 11/29/17  6:05 AM  Result Value Ref Range   WBC 17.3 (H) 3.6 - 11.0 K/uL   RBC 3.48 (L) 3.80 - 5.20 MIL/uL   Hemoglobin 11.5 (L) 12.0 - 16.0 g/dL   HCT 34.5 (L) 35.0 - 47.0 %   MCV 99.2 80.0 - 100.0 fL   MCH 32.9 26.0 - 34.0 pg   MCHC 33.2 32.0 - 36.0 g/dL   RDW 14.3 11.5 - 14.5 %   Platelets 363 150 - 440 K/uL  Basic metabolic panel     Status: Abnormal   Collection Time: 11/29/17  6:05 AM  Result Value Ref Range   Sodium 136 135 - 145 mmol/L   Potassium 3.7 3.5 - 5.1 mmol/L   Chloride 103 101 - 111 mmol/L   CO2 24 22 - 32 mmol/L   Glucose, Bld 121 (H) 65 - 99 mg/dL   BUN 27 (H) 6 - 20 mg/dL   Creatinine, Ser 1.57 (H) 0.44 - 1.00 mg/dL   Calcium 8.7 (L) 8.9 - 10.3 mg/dL   GFR calc non Af Amer 31 (L) >60 mL/min   GFR calc Af Amer 36 (L) >60 mL/min   Anion gap 9 5 - 15  Brain natriuretic peptide     Status: Abnormal   Collection Time: 11/29/17  6:05 AM  Result  Value Ref Range   B Natriuretic Peptide 537.0 (H) 0.0 - 100.0 pg/mL    Dg Chest Port 1 View  Result Date: 11/29/2017 CLINICAL DATA:  Shortness of breath. The patient underwent knee replacement yesterday. History of hypertension, peripheral vascular disease and coronary artery disease, previous CVA. EXAM: PORTABLE CHEST 1 VIEW COMPARISON:  None in PACs FINDINGS: The lungs are adequately inflated. The interstitial markings are increased. There is patchy airspace opacity in the left perihilar and right infrahilar regions. The pulmonary vascularity is engorged. The cardiac silhouette is enlarged. There is no pleural effusion. There is calcification in the wall of the aortic arch. There are degenerative changes of the right AC joint. IMPRESSION: CHF with mild interstitial and early alveolar edema. Thoracic aortic atherosclerosis. Electronically Signed   By: David  Martinique M.D.   On: 11/29/2017 11:04   Dg Knee Left Port  Result Date: 11/28/2017 CLINICAL DATA:  Post left knee replacement EXAM: PORTABLE LEFT KNEE - 1-2 VIEW COMPARISON:  None. FINDINGS:  Portable views of the left knee show the femoral and tibial components of the left total knee replacement to be in good position and alignment. No complicating features are seen. A small amount air is noted within soft tissues postoperatively and surgical drains are present. IMPRESSION: Left total knee replacement components in good position and alignment. No complicating features. Electronically Signed   By: Ivar Drape M.D.   On: 11/28/2017 11:47    Assessment/Plan: 1 Day Post-Op   Active Problems:   S/P total knee arthroplasty, left  Patient doing well postop.  Continue physical therapy. Hemovac drain has been removed by me. Foley catheter has been discontinued. Hemoglobin and hematocrit are within acceptable limits. Continue to monitor vitals. Appreciate hospitalist service following.    Thornton Park , MD 11/29/2017, 12:16 PM

## 2017-11-29 NOTE — Anesthesia Postprocedure Evaluation (Signed)
Anesthesia Post Note  Patient: Caroline [name]  Procedure(s) Performed: TOTAL KNEE ARTHROPLASTY (Left Knee)  Patient location during evaluation: Nursing Unit Anesthesia Type: Spinal Level of consciousness: oriented and awake and alert Pain management: satisfactory to patient Vital Signs Assessment: post-procedure vital signs reviewed and stable Respiratory status: spontaneous breathing Cardiovascular status: blood pressure returned to baseline and stable Postop Assessment: no headache, no backache, no apparent nausea or vomiting and spinal receding Anesthetic complications: no     Last Vitals:  Vitals:   11/28/17 2349 11/29/17 0405  BP: (!) 187/65 (!) 157/68  Pulse: 68 64  Resp:    Temp: 36.5 C 36.9 C  SpO2: 90% 91%    Last Pain:  Vitals:   11/29/17 0407  TempSrc:   PainSc: 8                  Brantley Fling

## 2017-11-30 DIAGNOSIS — I5033 Acute on chronic diastolic (congestive) heart failure: Secondary | ICD-10-CM

## 2017-11-30 LAB — TYPE AND SCREEN
ABO/RH(D): A POS
Antibody Screen: NEGATIVE

## 2017-11-30 LAB — CBC
HEMATOCRIT: 33.3 % — AB (ref 35.0–47.0)
Hemoglobin: 11.1 g/dL — ABNORMAL LOW (ref 12.0–16.0)
MCH: 32.8 pg (ref 26.0–34.0)
MCHC: 33.4 g/dL (ref 32.0–36.0)
MCV: 98 fL (ref 80.0–100.0)
Platelets: 384 10*3/uL (ref 150–440)
RBC: 3.39 MIL/uL — ABNORMAL LOW (ref 3.80–5.20)
RDW: 14.3 % (ref 11.5–14.5)
WBC: 16 10*3/uL — ABNORMAL HIGH (ref 3.6–11.0)

## 2017-11-30 LAB — ECHOCARDIOGRAM COMPLETE
HEIGHTINCHES: 62 in
WEIGHTICAEL: 2528 [oz_av]

## 2017-11-30 LAB — BASIC METABOLIC PANEL
ANION GAP: 13 (ref 5–15)
BUN: 29 mg/dL — ABNORMAL HIGH (ref 6–20)
CALCIUM: 8.9 mg/dL (ref 8.9–10.3)
CO2: 26 mmol/L (ref 22–32)
Chloride: 94 mmol/L — ABNORMAL LOW (ref 101–111)
Creatinine, Ser: 1.7 mg/dL — ABNORMAL HIGH (ref 0.44–1.00)
GFR, EST AFRICAN AMERICAN: 32 mL/min — AB (ref >60)
GFR, EST NON AFRICAN AMERICAN: 28 mL/min — AB (ref >60)
GLUCOSE: 123 mg/dL — AB (ref 65–99)
POTASSIUM: 3.1 mmol/L — AB (ref 3.5–5.1)
Sodium: 133 mmol/L — ABNORMAL LOW (ref 135–145)

## 2017-11-30 MED ORDER — POTASSIUM CHLORIDE 20 MEQ PO PACK
40.0000 meq | PACK | Freq: Once | ORAL | Status: AC
  Administered 2017-11-30: 40 meq via ORAL
  Filled 2017-11-30: qty 2
  Filled 2017-11-30: qty 1

## 2017-11-30 MED ORDER — POTASSIUM CHLORIDE CRYS ER 20 MEQ PO TBCR
40.0000 meq | EXTENDED_RELEASE_TABLET | ORAL | Status: DC
  Filled 2017-11-30: qty 2

## 2017-11-30 MED ORDER — FUROSEMIDE 20 MG PO TABS
20.0000 mg | ORAL_TABLET | Freq: Two times a day (BID) | ORAL | Status: DC
  Administered 2017-11-30 – 2017-12-01 (×2): 20 mg via ORAL
  Filled 2017-11-30 (×2): qty 1

## 2017-11-30 MED ORDER — POTASSIUM CHLORIDE CRYS ER 20 MEQ PO TBCR
40.0000 meq | EXTENDED_RELEASE_TABLET | Freq: Once | ORAL | Status: AC
  Administered 2017-11-30: 40 meq via ORAL
  Filled 2017-11-30: qty 2

## 2017-11-30 MED ORDER — AMLODIPINE BESYLATE 10 MG PO TABS
10.0000 mg | ORAL_TABLET | Freq: Every day | ORAL | Status: DC
  Administered 2017-12-01: 10 mg via ORAL
  Filled 2017-11-30: qty 1

## 2017-11-30 MED ORDER — IPRATROPIUM-ALBUTEROL 0.5-2.5 (3) MG/3ML IN SOLN: 3.0000 mL | RESPIRATORY_TRACT | Status: DC | PRN

## 2017-11-30 NOTE — Progress Notes (Signed)
Subjective:  POD #2 s/p left TKA.  Patient reports pain as mild.  Patient OOB to chair.  No BM yet.   Family at the bedside.   Patient not having any additional knee pain after a fall getting up from commode yesterday.   Spoke with Dr. Posey Pronto this AM.  O2 saturation is improved after lasix.  Will check UA for elevated WBC.  Objective:   VITALS:   Vitals:   11/29/17 1959 11/29/17 2116 11/30/17 0358 11/30/17 0759  BP: 138/60  (!) 159/59 (!) 141/77  Pulse: 80  67 66  Resp:    18  Temp:   98.1 F (36.7 C)   TempSrc:   Oral   SpO2:  100% 99% 91%  Weight:      Height:        PHYSICAL EXAM: Left knee" Neurovascular intact Sensation intact distally Intact pulses distally Dorsiflexion/Plantar flexion intact Incision: no drainage No cellulitis present Compartment soft  LABS  Results for orders placed or performed during the hospital encounter of 11/28/17 (from the past 24 hour(s))  CBC     Status: Abnormal   Collection Time: 11/30/17  3:32 AM  Result Value Ref Range   WBC 16.0 (H) 3.6 - 11.0 K/uL   RBC 3.39 (L) 3.80 - 5.20 MIL/uL   Hemoglobin 11.1 (L) 12.0 - 16.0 g/dL   HCT 33.3 (L) 35.0 - 47.0 %   MCV 98.0 80.0 - 100.0 fL   MCH 32.8 26.0 - 34.0 pg   MCHC 33.4 32.0 - 36.0 g/dL   RDW 14.3 11.5 - 14.5 %   Platelets 384 150 - 440 K/uL  Basic metabolic panel     Status: Abnormal   Collection Time: 11/30/17  3:32 AM  Result Value Ref Range   Sodium 133 (L) 135 - 145 mmol/L   Potassium 3.1 (L) 3.5 - 5.1 mmol/L   Chloride 94 (L) 101 - 111 mmol/L   CO2 26 22 - 32 mmol/L   Glucose, Bld 123 (H) 65 - 99 mg/dL   BUN 29 (H) 6 - 20 mg/dL   Creatinine, Ser 1.70 (H) 0.44 - 1.00 mg/dL   Calcium 8.9 8.9 - 10.3 mg/dL   GFR calc non Af Amer 28 (L) >60 mL/min   GFR calc Af Amer 32 (L) >60 mL/min   Anion gap 13 5 - 15    Dg Chest Port 1 View  Result Date: 11/29/2017 CLINICAL DATA:  Shortness of breath. The patient underwent knee replacement yesterday. History of hypertension,  peripheral vascular disease and coronary artery disease, previous CVA. EXAM: PORTABLE CHEST 1 VIEW COMPARISON:  None in PACs FINDINGS: The lungs are adequately inflated. The interstitial markings are increased. There is patchy airspace opacity in the left perihilar and right infrahilar regions. The pulmonary vascularity is engorged. The cardiac silhouette is enlarged. There is no pleural effusion. There is calcification in the wall of the aortic arch. There are degenerative changes of the right AC joint. IMPRESSION: CHF with mild interstitial and early alveolar edema. Thoracic aortic atherosclerosis. Electronically Signed   By: David  Martinique M.D.   On: 11/29/2017 11:04   Dg Knee Left Port  Result Date: 11/28/2017 CLINICAL DATA:  Post left knee replacement EXAM: PORTABLE LEFT KNEE - 1-2 VIEW COMPARISON:  None. FINDINGS: Portable views of the left knee show the femoral and tibial components of the left total knee replacement to be in good position and alignment. No complicating features are seen. A small amount air is noted  within soft tissues postoperatively and surgical drains are present. IMPRESSION: Left total knee replacement components in good position and alignment. No complicating features. Electronically Signed   By: Ivar Drape M.D.   On: 11/28/2017 11:47    Assessment/Plan: 2 Days Post-Op   Active Problems:   S/P total knee arthroplasty, left   Continue PT.  Continue polar care and elevation.  Patient is WBAT on left LE.  Continue lovenox.  Discharge expected tomorrow.   Thornton Park , MD 11/30/2017, 10:33 AM

## 2017-11-30 NOTE — Progress Notes (Signed)
Physical Therapy Treatment Patient Details Name: Caroline [name] MRN: [id number] DOB: 01/23/1941 Today's Date: 11/30/2017    History of Present Illness Admitted for acute hospitalization status post L TKR (11/28/17).    PT Comments    Pt initially with some posterior leaning during sit to stand transfers requiring assist from therapist to prevent her from falling backwards initially. Subsequent transfers are performed with improved safety and stability. Increased time required to perform. She is able to ambulate a full lap around RN station with a seated rest break in rehab gym. KI donned for all ambulation. She moves slowly but is able to increase her step length with increased distance. VSS throughout ambulation. Pt denies DOE and SaO2 is >90% on room air. Increased time required to complete lap around RN station. Pt requires cues and education for step-to pattern to ascend/descend steps. Bilateral UE support. KI donned for stairs. Pt with some mild instability. Discussed different strategies for stairs once returning home. Pt with good family support to help her navigate stairs. She is able to complete stair training with therapist today. She would benefit from [facility] to place over her low commode at home. Family educated about safety with transfers and ambulation as well as use of rolling walker. Pt has met all PT barriers for discharge and is safe to return home with family support when medically appropriate. Pt will benefit from PT services to address deficits in strength, balance, and mobility in order to return to full function at home.      Follow Up Recommendations  Outpatient PT;Supervision for mobility/OOB     Equipment Recommendations  Rolling walker with 5" wheels;3in1 (PT)    Recommendations for Other Services       Precautions / Restrictions Precautions Precautions: Fall;Knee Precaution Booklet Issued: No Precaution Comments: Pt with fall on 12/5 with family. Failed to wait for  staff when getting up from [facility]. Per pt she experienced LOB and no knee buckling Required Braces or Orthoses: Knee Immobilizer - Left Knee Immobilizer - Left: Other (comment)(on when resting in bed/chair per medical record) Restrictions Weight Bearing Restrictions: Yes LLE Weight Bearing: Weight bearing as tolerated    Mobility  Bed Mobility Overal bed mobility: Needs Assistance Bed Mobility: Supine to Sit     Supine to sit: Min assist     General bed mobility comments: Pt requiring assist for bilateral LEs when moving to the EOB. Moves slowly with KI donned throughout  Transfers Overall transfer level: Needs assistance Equipment used: Rolling walker (2 wheeled) Transfers: Sit to/from Stand Sit to Stand: Min guard         General transfer comment: Pt initially with some posterior leaning during sit to stand transfers requiring assist from therapist to prevent her from falling backwards initially. Subsequent transfers are performed with improved safety and stability. Increased time required to perform  Ambulation/Gait Ambulation/Gait assistance: Min guard Ambulation Distance (Feet): 250 Feet Assistive device: Rolling walker (2 wheeled) Gait Pattern/deviations: Decreased step length - right;Decreased step length - left Gait velocity: Decreased Gait velocity interpretation: <1.8 ft/sec, indicative of risk for recurrent falls General Gait Details: Pt able to ambulate a full lap around RN station with a seated rest break in rehab gym. KI donned for all ambulation. She moves slowly but is able to increase her step length with increased distance. VSS throughout ambulation. Pt denies DOE and SaO2 is >90% on room air. Increased time required to complete lap around RN station   Stairs Stairs: Yes  Stair Management: Two rails;Step to pattern Number of Stairs: 4 General stair comments: Pt requires cues and education for step-to pattern to ascend/descend steps. Bilateral UE support. KI  donned for stairs. Pt with some mild instability. Discussed different strategies for stairs once returning home. Pt with good family support to help her navigate stairs. She is able to complete stair training with therapist today.   Wheelchair Mobility    Modified Rankin (Stroke Patients Only)       Balance Overall balance assessment: Needs assistance Sitting-balance support: No upper extremity supported Sitting balance-Leahy Scale: Good     Standing balance support: Bilateral upper extremity supported Standing balance-Leahy Scale: Fair Standing balance comment: Able to maintain balance without UE support but much more steady with UE support                            Cognition Arousal/Alertness: Awake/alert Behavior During Therapy: WFL for tasks assessed/performed Overall Cognitive Status: Within Functional Limits for tasks assessed                                        Exercises Total Joint Exercises Ankle Circles/Pumps: AROM;Both;15 reps;Supine Quad Sets: Strengthening;Both;15 reps;Supine Gluteal Sets: Strengthening;Both;15 reps;Supine Towel Squeeze: Strengthening;Both;15 reps;Supine Heel Slides: Strengthening;Left;15 reps;Supine Hip ABduction/ADduction: Strengthening;Left;15 reps;Supine Straight Leg Raises: Strengthening;Left;15 reps;Supine Goniometric ROM: -3 to 88 degrees AAROM    General Comments        Pertinent Vitals/Pain Pain Assessment: 0-10 Pain Score: 4  Pain Location: L knee, denies pain aywhere else Pain Descriptors / Indicators: Operative site guarding Pain Intervention(s): Monitored during session    Home Living                      Prior Function            PT Goals (current goals can now be found in the care plan section) Acute Rehab PT Goals Patient Stated Goal: To get stronger and go home PT Goal Formulation: With patient Time For Goal Achievement: 12/12/17 Potential to Achieve Goals: Good Progress  towards PT goals: Progressing toward goals    Frequency    BID      PT Plan Current plan remains appropriate    Co-evaluation              AM-PAC PT "6 Clicks" Daily Activity  Outcome Measure  Difficulty turning over in bed (including adjusting bedclothes, sheets and blankets)?: Unable Difficulty moving from lying on back to sitting on the side of the bed? : Unable Difficulty sitting down on and standing up from a chair with arms (e.g., wheelchair, bedside commode, etc,.)?: A Little Help needed moving to and from a bed to chair (including a wheelchair)?: A Little Help needed walking in hospital room?: A Little Help needed climbing 3-5 steps with a railing? : A Little 6 Click Score: 14    End of Session Equipment Utilized During Treatment: Gait belt Activity Tolerance: Patient tolerated treatment well Patient left: in chair;with call bell/phone within reach;with chair alarm set;with family/visitor present;with SCD's reapplied;Other (comment)(polar care in place, towel roll under heel)   PT Visit Diagnosis: Muscle weakness (generalized) (M62.81);Difficulty in walking, not elsewhere classified (R26.2);Pain Pain - Right/Left: Left Pain - part of body: Knee     Time: 1610-9604 PT Time Calculation (min) (ACUTE ONLY): 56 min  Charges:  $Gait  Training: 23-37 mins $Therapeutic Exercise: 8-22 mins                    G Codes:       Lyndel Safe Laiya Wisby PT, DPT     Keslie Gritz 11/30/2017, 11:47 AM

## 2017-11-30 NOTE — Progress Notes (Signed)
Progress Note  Patient Name: Caroline [name] Date of Encounter: 11/30/2017  Primary Cardiologist: Ida Rogue, MD  Subjective   Feeling much better.  Good response to lasix, though I/Os don't reflect this.  No chest pain/palps.  Breathing much improved.  Inpatient Medications    Scheduled Meds: . allopurinol  100 mg Oral Daily  . [START ON 12/01/2017] amLODipine  10 mg Oral Daily  . apixaban  5 mg Oral BID  . celecoxib  200 mg Oral Q12H  . [START ON 12/11/2017] cyanocobalamin  1,000 mcg Intramuscular Q30 days  . docusate sodium  100 mg Oral BID  . furosemide  20 mg Oral BID  . gabapentin  300 mg Oral TID  . levothyroxine  112 mcg Oral QAC breakfast  . lisinopril  40 mg Oral Daily  . metoprolol tartrate  25 mg Oral BID  . nitroGLYCERIN  0.5 inch Topical Q6H  . potassium chloride  10 mEq Oral BID  . potassium chloride  40 mEq Oral Q4H  . simvastatin  20 mg Oral QHS   Continuous Infusions: . methocarbamol (ROBAXIN)  IV     PRN Meds: acetaminophen **OR** acetaminophen, bisacodyl, cloNIDine, hydrALAZINE, ipratropium-albuterol, menthol-cetylpyridinium **OR** phenol, methocarbamol **OR** methocarbamol (ROBAXIN)  IV, metoCLOPramide **OR** metoCLOPramide (REGLAN) injection, morphine injection, ondansetron **OR** ondansetron (ZOFRAN) IV, oxyCODONE, oxyCODONE, senna-docusate, sodium phosphate   Vital Signs    Vitals:   11/29/17 1959 11/29/17 2116 11/30/17 0358 11/30/17 0759  BP: 138/60  (!) 159/59 (!) 141/77  Pulse: 80  67 66  Resp:    18  Temp:   98.1 F (36.7 C)   TempSrc:   Oral   SpO2:  100% 99% 91%  Weight:      Height:        Intake/Output Summary (Last 24 hours) at 11/30/2017 1037 Last data filed at 11/30/2017 0929 Gross per 24 hour  Intake 600 ml  Output 825 ml  Net -225 ml   Filed Weights   10/26/17 0614 11/28/17 0625  Weight: 155 lb (70.3 kg) 158 lb (71.7 kg)    Physical Exam   GEN: Well nourished, well developed, in no acute distress.  HEENT:  Grossly normal.  Neck: Supple, JVP ~ 12 cm, carotid bruits, or masses. Cardiac: RRR, no murmurs, rubs, or gallops. No clubbing, cyanosis, edema.  Radials/DP/PT 2+ and equal bilaterally.  Respiratory:  Respirations regular and unlabored, clear to auscultation bilaterally. GI: Soft, nontender, nondistended, BS + x 4. MS: no deformity or atrophy. Skin: warm and dry, no rash. Neuro:  Strength and sensation are intact. Psych: AAOx3.  Normal affect.  Labs    Chemistry Recent Labs  Lab 11/28/17 0636 11/29/17 0605 11/30/17 0332  NA 140 136 133*  K 3.6 3.7 3.1*  CL  --  103 94*  CO2  --  24 26  GLUCOSE 117* 121* 123*  BUN  --  27* 29*  CREATININE  --  1.57* 1.70*  CALCIUM  --  8.7* 8.9  GFRNONAA  --  31* 28*  GFRAA  --  36* 32*  ANIONGAP  --  9 13     Hematology Recent Labs  Lab 11/28/17 0636 11/29/17 0605 11/30/17 0332  WBC  --  17.3* 16.0*  RBC  --  3.48* 3.39*  HGB 12.2 11.5* 11.1*  HCT 36.0 34.5* 33.3*  MCV  --  99.2 98.0  MCH  --  32.9 32.8  MCHC  --  33.2 33.4  RDW  --  14.3 14.3  PLT  --  363 384   BNP Recent Labs  Lab 11/29/17 0605  BNP 537.0*    Radiology    Dg Chest Port 1 View  Result Date: 11/29/2017 CLINICAL DATA:  Shortness of breath. The patient underwent knee replacement yesterday. History of hypertension, peripheral vascular disease and coronary artery disease, previous CVA. EXAM: PORTABLE CHEST 1 VIEW COMPARISON:  None in PACs FINDINGS: The lungs are adequately inflated. The interstitial markings are increased. There is patchy airspace opacity in the left perihilar and right infrahilar regions. The pulmonary vascularity is engorged. The cardiac silhouette is enlarged. There is no pleural effusion. There is calcification in the wall of the aortic arch. There are degenerative changes of the right AC joint. IMPRESSION: CHF with mild interstitial and early alveolar edema. Thoracic aortic atherosclerosis. Electronically Signed   By: David  Martinique M.D.    On: 11/29/2017 11:04   Dg Knee Left Port  Result Date: 11/28/2017 CLINICAL DATA:  Post left knee replacement EXAM: PORTABLE LEFT KNEE - 1-2 VIEW COMPARISON:  None. FINDINGS: Portable views of the left knee show the femoral and tibial components of the left total knee replacement to be in good position and alignment. No complicating features are seen. A small amount air is noted within soft tissues postoperatively and surgical drains are present. IMPRESSION: Left total knee replacement components in good position and alignment. No complicating features. Electronically Signed   By: Ivar Drape M.D.   On: 11/28/2017 11:47   Cardiac Studies   2D Echocardiogram 1.25.2018  Study Conclusions   - Left ventricle: The cavity size was normal. There was moderate   concentric hypertrophy. Systolic function was normal. The   estimated ejection fraction was in the range of 55% to 60%. Wall   motion was normal; there were no regional wall motion   abnormalities. Features are consistent with a pseudonormal left   ventricular filling pattern, with concomitant abnormal relaxation   and increased filling pressure (grade 2 diastolic dysfunction). - Mitral valve: Calcified annulus. Mildly thickened, severely   calcified leaflets . The findings are consistent with moderate   stenosis. There was mild regurgitation. Mean gradient (D): 10 mm   Hg. Valve area by pressure half-time: 1.91 cm^2. - Left atrium: The atrium was moderately dilated. - Pulmonary arteries: Systolic pressure was moderately increased.   PA peak pressure: 50 mm Hg (S). _____________   Patient Profile     76 y.o. female with a h/o HFpEF, HTN, PAF, and CKD III, who was admitted following L TKA and developed volume overload.  Assessment & Plan    1.  DJD s/p L TKA:  Per ortho.  2.  Acute on chronic diastolic CHF:  Nl EF by echo 12/2016.  Gr2 DD.  Mod MS. PASP 39mmhg.  Developed volume overload in the setting of HTN and IVF post-op.  IV  lasix started 12/5.  ? Accuracy of I/Os as only minus 220 yesterday, though she reports good response to lasix with significant improvement in breathing.  No weight since 12/4.  Euvolemic on exam this AM.  BUN/Creat/Bicarb up slightly this AM.  Transition back to oral diuretic today - lasix 20 daily daily @ home previously.  She was also on HCTZ in form of zestoretic.  Agree w/ holding off on HCTZ in setting of CKD III-IV and changing home lasix to 20 BID.  3.  PAF:  Regular on exam.  Cont  blocker and eliquis.  4.  Hypertensive Heart Dzs:  141/77  this am.  Cont  blocker, acei, ccb.  Agree with titrating amlodipine to 10mg  daily.  5. CKD III-IV:  Creat up this am. Transitioning back to PO lasix.  W/ advanced CKD, should prob cont to avoid HCTZ.  6. Moderate Mitral Stenosis:  Follow.  Signed, Murray Hodgkins, NP  11/30/2017, 10:37 AM    For questions or updates, please contact   Please consult [email or web address] for contact info under Cardiology/STEMI.

## 2017-11-30 NOTE — Care Management (Signed)
Ordered bedside commode from [facility]

## 2017-11-30 NOTE — Care Management Note (Signed)
Case Management Note  Patient Details  Name: Caroline [name] MRN: [id number] Date of Birth: 1941/05/10  Subjective/Objective:   Discharging today                 Action/Plan: DME to be delivered today prior to discharge.   Expected Discharge Date:  11/30/17               Expected Discharge Plan:  OP Rehab  In-House Referral:     Discharge planning Services  CM Consult  Post Acute Care Choice:  Durable Medical Equipment Choice offered to:  Patient, Spouse  DME Arranged:  Walker rolling DME Agency:  [place]:    [place]:     Status of Service:  Completed, signed off  If discussed at [place] of Stay Meetings, dates discussed:    Additional Comments:  Jolly Mango, RN 11/30/2017, 9:58 AM

## 2017-11-30 NOTE — Progress Notes (Signed)
Physical Therapy Treatment Patient Details Name: Caroline [name] MRN: [id number] DOB: 02/21/1941 Today's Date: 11/30/2017    History of Present Illness Admitted for acute hospitalization status post L TKR (11/28/17).    PT Comments    Pt demonstrates good progress with therapy this afternoon. Pt is again able to complete a full lap around RN station. Cues again provided for speed and step length. As she fatigued with increased distance she starts to lean forward and pt requires cues to remain within confines of walker. Denies DOE with ambulation. Practiced stairs with unilateral L rail and side stepping. She is able to complete stair training with good safety and stability. Husband is present for stair training. Cues required to place L foot closer to right foot when ascending stairs for safety. KI donned for stair training. She is able to complete all supine exercises as instructed. Pt has met all PT goals for discharge. She is safe to return home when medically appropriate with family support. Pt will benefit from PT services to address deficits in strength, balance, and mobility in order to return to full function at home.    Follow Up Recommendations  Outpatient PT;Supervision for mobility/OOB     Equipment Recommendations  Rolling walker with 5" wheels;3in1 (PT)    Recommendations for Other Services       Precautions / Restrictions Precautions Precautions: Fall;Knee Precaution Booklet Issued: No Precaution Comments: Pt with fall on 12/5 with family. Failed to wait for staff when getting up from [facility]. Per pt she experienced LOB and no knee buckling Required Braces or Orthoses: Knee Immobilizer - Left Knee Immobilizer - Left: Other (comment)(on when resting in bed/chair per medical record) Restrictions Weight Bearing Restrictions: Yes LLE Weight Bearing: Weight bearing as tolerated    Mobility  Bed Mobility Overal bed mobility: Needs Assistance Bed Mobility: Supine to Sit      Supine to sit: Min assist     General bed mobility comments: Pt moves better this afternoon and abduction strength for LLE is improving. She continues to require increased time to perform  Transfers Overall transfer level: Needs assistance Equipment used: Rolling walker (2 wheeled) Transfers: Sit to/from Stand Sit to Stand: Min guard         General transfer comment: Pt with improved balance this afternoon during transfers. Continues to require cues and instruction for safe hand placement during transfers  Ambulation/Gait Ambulation/Gait assistance: Min guard Ambulation Distance (Feet): 250 Feet Assistive device: Rolling walker (2 wheeled) Gait Pattern/deviations: Decreased step length - right;Decreased step length - left Gait velocity: Decreased Gait velocity interpretation: <1.8 ft/sec, indicative of risk for recurrent falls General Gait Details: Pt is again able to complete a full lap around RN station. Cues again provided for speed and step length. As she fatigued with increased distance she starts to lean forward and pt requires cues to remain within confines of walker. Denies DOE with ambulation   Stairs Stairs: Yes   Stair Management: Step to pattern;One rail Left Number of Stairs: 4 General stair comments: Practiced stairs with unilateral L rail and side stepping. She is able to complete stair training with good safety and stability. Husband is present for stair training. Cues required to place L foot closer to right foot when ascending stairs for safety. KI donned for stair training  Wheelchair Mobility    Modified Rankin (Stroke Patients Only)       Balance Overall balance assessment: Needs assistance Sitting-balance support: No upper extremity supported Sitting balance-Leahy Scale: Good  Standing balance support: Bilateral upper extremity supported Standing balance-Leahy Scale: Fair Standing balance comment: Able to maintain balance without UE support  but much more steady with UE support                            Cognition Arousal/Alertness: Awake/alert Behavior During Therapy: WFL for tasks assessed/performed Overall Cognitive Status: Within Functional Limits for tasks assessed                                        Exercises Total Joint Exercises Ankle Circles/Pumps: AROM;Both;Supine;10 reps Quad Sets: Strengthening;Both;Supine;10 reps Gluteal Sets: Strengthening;Both;Supine;10 reps Towel Squeeze: Strengthening;Both;Supine;10 reps Short Arc Quad: Strengthening;Left;10 reps;Supine Heel Slides: Strengthening;Left;15 reps;Supine Hip ABduction/ADduction: Strengthening;Left;Supine;10 reps Straight Leg Raises: Strengthening;Left;Supine;10 reps Other Exercises Other Exercises: Pt family education in home exercise program, safe transfers, stair training, use of walker, and use of gait belt    General Comments        Pertinent Vitals/Pain Pain Assessment: 0-10 Pain Score: 4  Pain Location: L knee, denies pain aywhere else Pain Descriptors / Indicators: Operative site guarding Pain Intervention(s): Monitored during session    Home Living                      Prior Function            PT Goals (current goals can now be found in the care plan section) Acute Rehab PT Goals Patient Stated Goal: To get stronger and go home PT Goal Formulation: With patient Time For Goal Achievement: 12/12/17 Potential to Achieve Goals: Good Progress towards PT goals: Progressing toward goals    Frequency    BID      PT Plan Current plan remains appropriate    Co-evaluation              AM-PAC PT "6 Clicks" Daily Activity  Outcome Measure  Difficulty turning over in bed (including adjusting bedclothes, sheets and blankets)?: Unable Difficulty moving from lying on back to sitting on the side of the bed? : Unable Difficulty sitting down on and standing up from a chair with arms (e.g.,  wheelchair, bedside commode, etc,.)?: A Little Help needed moving to and from a bed to chair (including a wheelchair)?: A Little Help needed walking in hospital room?: A Little Help needed climbing 3-5 steps with a railing? : A Little 6 Click Score: 14    End of Session Equipment Utilized During Treatment: Gait belt Activity Tolerance: Patient tolerated treatment well Patient left: with call bell/phone within reach;with family/visitor present;with SCD's reapplied;Other (comment);in bed;with bed alarm set(polar care in place, towel roll under heel)   PT Visit Diagnosis: Muscle weakness (generalized) (M62.81);Difficulty in walking, not elsewhere classified (R26.2);Pain Pain - Right/Left: Left Pain - part of body: Knee     Time: 1062-6948 PT Time Calculation (min) (ACUTE ONLY): 41 min  Charges:  $Gait Training: 8-22 mins $Therapeutic Exercise: 8-22 mins $Therapeutic Activity: 8-22 mins                    G Codes:       Lyndel Safe Gaspare Netzel PT, DPT     Pilar Westergaard 11/30/2017, 3:57 PM

## 2017-11-30 NOTE — Progress Notes (Signed)
Pt repted to RN at 1700 that she had not voided since early this morning. Pt repts that she "doesn't feel like she needs to go". Pt bladder scanned for 876 cc. In and out cath performed. Pt tolerated well. 800 cc clear yellow urine obtained. Specimen obtained for u/a and C and S per MD order. All repted to night RN.

## 2017-11-30 NOTE — Progress Notes (Signed)
[place] at [facility]                                                                                                                                                                                  Patient Demographics   Caroline [name], is a 76 y.o. female, DOB - 1941-01-16, [id number]  Admit date - 11/28/2017   Admitting Physician Thornton Park, MD  Outpatient Primary MD for the patient is Remi Haggard, FNP   LOS - 2  Subjective:  Patient doing much better shortness of breath improved Wheezing resolved    Review of Systems:   CONSTITUTIONAL: No documented fever. No fatigue, weakness. No weight gain, no weight loss.  EYES: No blurry or double vision.  ENT: No tinnitus. No postnasal drip. No redness of the oropharynx.  RESPIRATORY: No cough, wheeze, no hemoptysis.  dyspnea.  CARDIOVASCULAR: No chest pain. No orthopnea. No palpitations. No syncope.  GASTROINTESTINAL: No nausea, no vomiting or diarrhea. No abdominal pain. No melena or hematochezia.  GENITOURINARY: No dysuria or hematuria.  ENDOCRINE: No polyuria or nocturia. No heat or cold intolerance.  HEMATOLOGY: No anemia. No bruising. No bleeding.  INTEGUMENTARY: No rashes. No lesions.  MUSCULOSKELETAL: No arthritis. No swelling. No gout.  NEUROLOGIC: No numbness, tingling, or ataxia. No seizure-type activity.  PSYCHIATRIC: No anxiety. No insomnia. No ADD.    Vitals:   Vitals:   11/29/17 1959 11/29/17 2116 11/30/17 0358 11/30/17 0759  BP: 138/60  (!) 159/59 (!) 141/77  Pulse: 80  67 66  Resp:    18  Temp:   98.1 F (36.7 C)   TempSrc:   Oral   SpO2:  100% 99% 91%  Weight:      Height:        Wt Readings from Last 3 Encounters:  11/28/17 158 lb (71.7 kg)  10/04/17 158 lb (71.7 kg)  08/15/17 157 lb 8 oz (71.4 kg)     Intake/Output Summary (Last 24 hours) at 11/30/2017 1256 Last data filed at 11/30/2017 0929 Gross per 24 hour  Intake 600 ml  Output 825 ml  Net -225 ml     Physical Exam:   GENERAL: Pleasant-appearing in no apparent distress.  HEAD, EYES, EARS, NOSE AND THROAT: Atraumatic, normocephalic. Extraocular muscles are intact. Pupils equal and reactive to light. Sclerae anicteric. No conjunctival injection. No oro-pharyngeal erythema.  NECK: Supple. There is no jugular venous distention. No bruits, no lymphadenopathy, no thyromegaly.  HEART: Regular rate and rhythm,. No murmurs, no rubs, no clicks.  LUNGS: Clear to auscultation bilaterally without any accessory muscle usage ABDOMEN: Soft, flat, nontender, nondistended. Has good bowel sounds. No hepatosplenomegaly appreciated.  EXTREMITIES:  No evidence of any cyanosis, clubbing, or peripheral edema.  +2 pedal and radial pulses bilaterally.  NEUROLOGIC: The patient is alert, awake, and oriented x3 with no focal motor or sensory deficits appreciated bilaterally.  SKIN: Moist and warm with no rashes appreciated.  Psych: Not anxious, depressed LN: No inguinal LN enlargement    Antibiotics   Anti-infectives (From admission, onward)   Start     Dose/Rate Route Frequency Ordered Stop   11/28/17 1400  ceFAZolin (ANCEF) 1 g in dextrose 5 % 50 mL IVPB     1 g 120 mL/hr over 30 Minutes Intravenous Every 6 hours 11/28/17 1250 11/28/17 2237   11/28/17 1245  ceFAZolin (ANCEF) IVPB 1 g/50 mL premix  Status:  Discontinued     1 g 100 mL/hr over 30 Minutes Intravenous Every 6 hours 11/28/17 1236 11/28/17 1250   11/28/17 0701  ceFAZolin (ANCEF) 2-4 GM/100ML-% IVPB    Comments:  Jeanene Erb   : cabinet override      11/28/17 0701 11/28/17 1914   11/28/17 0700  clindamycin (CLEOCIN) 600 MG/50ML IVPB    Comments:  Jeanene Erb   : cabinet override      11/28/17 0700 11/28/17 0844   10/26/17 0600  ceFAZolin (ANCEF) IVPB 2g/100 mL premix     2 g 200 mL/hr over 30 Minutes Intravenous On call to O.R. 10/25/17 2152 10/27/17 0559   10/26/17 0600  ceFAZolin (ANCEF) 2-4 GM/100ML-% IVPB    Comments:   Larita Fife   : cabinet override      10/26/17 0600 10/26/17 1814      Medications   Scheduled Meds: . allopurinol  100 mg Oral Daily  . [START ON 12/01/2017] amLODipine  10 mg Oral Daily  . apixaban  5 mg Oral BID  . celecoxib  200 mg Oral Q12H  . [START ON 12/11/2017] cyanocobalamin  1,000 mcg Intramuscular Q30 days  . docusate sodium  100 mg Oral BID  . furosemide  20 mg Oral BID  . gabapentin  300 mg Oral TID  . levothyroxine  112 mcg Oral QAC breakfast  . lisinopril  40 mg Oral Daily  . metoprolol tartrate  25 mg Oral BID  . nitroGLYCERIN  0.5 inch Topical Q6H  . potassium chloride  10 mEq Oral BID  . potassium chloride  40 mEq Oral Once  . simvastatin  20 mg Oral QHS   Continuous Infusions: . methocarbamol (ROBAXIN)  IV     PRN Meds:.acetaminophen **OR** acetaminophen, bisacodyl, cloNIDine, hydrALAZINE, ipratropium-albuterol, menthol-cetylpyridinium **OR** phenol, methocarbamol **OR** methocarbamol (ROBAXIN)  IV, metoCLOPramide **OR** metoCLOPramide (REGLAN) injection, morphine injection, ondansetron **OR** ondansetron (ZOFRAN) IV, oxyCODONE, oxyCODONE, senna-docusate, sodium phosphate   Data Review:   Micro Results No results found for this or any previous visit (from the past 240 hour(s)).  Radiology Reports Dg Chest Port 1 View  Result Date: 11/29/2017 CLINICAL DATA:  Shortness of breath. The patient underwent knee replacement yesterday. History of hypertension, peripheral vascular disease and coronary artery disease, previous CVA. EXAM: PORTABLE CHEST 1 VIEW COMPARISON:  None in PACs FINDINGS: The lungs are adequately inflated. The interstitial markings are increased. There is patchy airspace opacity in the left perihilar and right infrahilar regions. The pulmonary vascularity is engorged. The cardiac silhouette is enlarged. There is no pleural effusion. There is calcification in the wall of the aortic arch. There are degenerative changes of the right AC joint.  IMPRESSION: CHF with mild interstitial and early alveolar edema. Thoracic aortic atherosclerosis. Electronically Signed  By: David  Martinique M.D.   On: 11/29/2017 11:04   Dg Knee Left Port  Result Date: 11/28/2017 CLINICAL DATA:  Post left knee replacement EXAM: PORTABLE LEFT KNEE - 1-2 VIEW COMPARISON:  None. FINDINGS: Portable views of the left knee show the femoral and tibial components of the left total knee replacement to be in good position and alignment. No complicating features are seen. A small amount air is noted within soft tissues postoperatively and surgical drains are present. IMPRESSION: Left total knee replacement components in good position and alignment. No complicating features. Electronically Signed   By: Ivar Drape M.D.   On: 11/28/2017 11:47     CBC Recent Labs  Lab 11/28/17 0636 11/29/17 0605 11/30/17 0332  WBC  --  17.3* 16.0*  HGB 12.2 11.5* 11.1*  HCT 36.0 34.5* 33.3*  PLT  --  363 384  MCV  --  99.2 98.0  MCH  --  32.9 32.8  MCHC  --  33.2 33.4  RDW  --  14.3 14.3    Chemistries  Recent Labs  Lab 11/28/17 0636 11/29/17 0605 11/30/17 0332  NA 140 136 133*  K 3.6 3.7 3.1*  CL  --  103 94*  CO2  --  24 26  GLUCOSE 117* 121* 123*  BUN  --  27* 29*  CREATININE  --  1.57* 1.70*  CALCIUM  --  8.7* 8.9   ------------------------------------------------------------------------------------------------------------------ estimated creatinine clearance is 25.7 mL/min (A) (by C-G formula based on SCr of 1.7 mg/dL (H)). ------------------------------------------------------------------------------------------------------------------ No results for input(s): HGBA1C in the last 72 hours. ------------------------------------------------------------------------------------------------------------------ No results for input(s): CHOL, HDL, LDLCALC, TRIG, CHOLHDL, LDLDIRECT in the last 72  hours. ------------------------------------------------------------------------------------------------------------------ No results for input(s): TSH, T4TOTAL, T3FREE, THYROIDAB in the last 72 hours.  Invalid input(s): FREET3 ------------------------------------------------------------------------------------------------------------------ No results for input(s): VITAMINB12, FOLATE, FERRITIN, TIBC, IRON, RETICCTPCT in the last 72 hours.  Coagulation profile No results for input(s): INR, PROTIME in the last 168 hours.  No results for input(s): DDIMER in the last 72 hours.  Cardiac Enzymes No results for input(s): CKMB, TROPONINI, MYOGLOBIN in the last 168 hours.  Invalid input(s): CK ------------------------------------------------------------------------------------------------------------------ Invalid input(s): POCBNP    Assessment & Plan   Assessment/Plan:  #Acute acute diastolic CHF resolved Change patient to oral Lasix   # Accelerated hypertension Continue higher dose lisinopril Due to concurrent Lasix stop HCTZ Continue Cardizem, metoprolol Increased dose of Norvasc  #Leukocytosis will obtain a urinalysis and urine culture Chest x-ray is negative No antibiotics for now  #Paroxysmal atrial fibrillation rate controlled Continue Cardizem CD 120 mg and eliquis  #Hyperlipidemia continue Zocor  #Left knee arthroplasty  Pain management per ortho Continue Robaxin and morphine as needed for severe pain  #Chronic kidney disease Avoid nephrotoxins follow renal function closely  #Pulmonary hypertension needs outpatient pulmonary follow-up with likely sleep study  #Moderate mitral valve stenosis outpatient cardiology follow-up      Code Status Orders  (From admission, onward)        Start     Ordered   11/28/17 1237  Full code  Continuous     11/28/17 1236    Code Status History    Date Active Date Inactive Code Status Order ID Comments User Context    06/06/2017 18:02 06/07/2017 18:12 Full Code [id number]  Nicholes Mango, MD ED    Advance Directive Documentation     Most Recent Value  Type of Advance Directive  Healthcare Power of Attorney  Pre-existing out of facility DNR  order (yellow form or pink MOST form)  No data  "MOST" Form in Place?  No data             DVT Prophylaxis on full dose anticoagulation Lab Results  Component Value Date   PLT 384 11/30/2017     Time Spent in minutes 35 minutes  Greater than 50% of time spent in care coordination and counseling patient regarding the condition and plan of care.   Dustin Flock M.D on 11/30/2017 at 12:56 PM  Between 7am to 6pm - Pager - [phone]  After 6pm go to [email or web address] - password [email or web address] [facility]   Office  [phone]

## 2017-11-30 NOTE — Progress Notes (Addendum)
Rept received from [facility]. Pt resting comfortably with family visiting. No s/sx  or c/o resp or cardiac distress. NSL to L hand. Pt had BM today. Resps even and unlabored. Honeycomb dressing that is clean, dry and intact to L knee. Polar care to L knee. DVT prophylaxis foot pumps on and Lovenox per order. Nitro patch to R anterior chest that is intact. Per rept, patch placed at 1514. Pt alert and oriented X4. No change in patient from AM assessment. Will continue to monitor.

## 2017-12-01 DIAGNOSIS — I5032 Chronic diastolic (congestive) heart failure: Secondary | ICD-10-CM

## 2017-12-01 DIAGNOSIS — J810 Acute pulmonary edema: Secondary | ICD-10-CM

## 2017-12-01 DIAGNOSIS — I342 Nonrheumatic mitral (valve) stenosis: Secondary | ICD-10-CM

## 2017-12-01 LAB — URINALYSIS, COMPLETE (UACMP) WITH MICROSCOPIC
BACTERIA UA: NONE SEEN
Bilirubin Urine: NEGATIVE
Glucose, UA: NEGATIVE mg/dL
Hgb urine dipstick: NEGATIVE
Ketones, ur: NEGATIVE mg/dL
Leukocytes, UA: NEGATIVE
Nitrite: NEGATIVE
PH: 6 (ref 5.0–8.0)
Protein, ur: NEGATIVE mg/dL
RBC / HPF: NONE SEEN RBC/hpf (ref 0–5)
SPECIFIC GRAVITY, URINE: 1.01 (ref 1.005–1.030)

## 2017-12-01 LAB — BASIC METABOLIC PANEL
ANION GAP: 12 (ref 5–15)
BUN: 34 mg/dL — ABNORMAL HIGH (ref 6–20)
CO2: 28 mmol/L (ref 22–32)
Calcium: 9.2 mg/dL (ref 8.9–10.3)
Chloride: 87 mmol/L — ABNORMAL LOW (ref 101–111)
Creatinine, Ser: 1.8 mg/dL — ABNORMAL HIGH (ref 0.44–1.00)
GFR calc Af Amer: 30 mL/min — ABNORMAL LOW (ref >60)
GFR, EST NON AFRICAN AMERICAN: 26 mL/min — AB (ref >60)
GLUCOSE: 122 mg/dL — AB (ref 65–99)
POTASSIUM: 4.8 mmol/L (ref 3.5–5.1)
Sodium: 127 mmol/L — ABNORMAL LOW (ref 135–145)

## 2017-12-01 LAB — CBC
HEMATOCRIT: 31.5 % — AB (ref 35.0–47.0)
Hemoglobin: 10.9 g/dL — ABNORMAL LOW (ref 12.0–16.0)
MCH: 33.7 pg (ref 26.0–34.0)
MCHC: 34.6 g/dL (ref 32.0–36.0)
MCV: 97.4 fL (ref 80.0–100.0)
Platelets: 395 10*3/uL (ref 150–440)
RBC: 3.24 MIL/uL — AB (ref 3.80–5.20)
RDW: 14.7 % — ABNORMAL HIGH (ref 11.5–14.5)
WBC: 14.4 10*3/uL — AB (ref 3.6–11.0)

## 2017-12-01 MED ORDER — FUROSEMIDE 20 MG PO TABS: 20.0000 mg | ORAL_TABLET | Freq: Every day | ORAL | Status: DC

## 2017-12-01 MED ORDER — CLONIDINE HCL 0.1 MG PO TABS: 0.1000 mg | ORAL_TABLET | Freq: Four times a day (QID) | ORAL | 11 refills | Status: DC | PRN

## 2017-12-01 MED ORDER — LISINOPRIL 40 MG PO TABS: 40.0000 mg | ORAL_TABLET | Freq: Every day | ORAL | 0 refills | Status: DC

## 2017-12-01 MED ORDER — OXYCODONE HCL 5 MG PO TABS: 5.0000 mg | ORAL_TABLET | ORAL | 0 refills | Status: DC | PRN

## 2017-12-01 MED ORDER — AMLODIPINE BESYLATE 10 MG PO TABS: 10.0000 mg | ORAL_TABLET | Freq: Every day | ORAL | 0 refills | Status: DC

## 2017-12-01 MED ORDER — METOPROLOL TARTRATE 50 MG PO TABS: 50.0000 mg | ORAL_TABLET | Freq: Two times a day (BID) | ORAL | 11 refills | Status: DC

## 2017-12-01 NOTE — Progress Notes (Signed)
[place] at [facility]                                                                                                                                                                                  Patient Demographics   Caroline [name], is a 76 y.o. female, DOB - August 30, 1941, VQQ:[id number]  Admit date - 11/28/2017   Admitting Physician Thornton Park, MD  Outpatient Primary MD for the patient is Remi Haggard, FNP   LOS - 3  Subjective: Patient doing much better was able to ambulate with therapy No shortness of breath  Review of Systems:   CONSTITUTIONAL: No documented fever. No fatigue, weakness. No weight gain, no weight loss.  EYES: No blurry or double vision.  ENT: No tinnitus. No postnasal drip. No redness of the oropharynx.  RESPIRATORY: No cough, wheeze, no hemoptysis.  dyspnea.  CARDIOVASCULAR: No chest pain. No orthopnea. No palpitations. No syncope.  GASTROINTESTINAL: No nausea, no vomiting or diarrhea. No abdominal pain. No melena or hematochezia.  GENITOURINARY: No dysuria or hematuria.  ENDOCRINE: No polyuria or nocturia. No heat or cold intolerance.  HEMATOLOGY: No anemia. No bruising. No bleeding.  INTEGUMENTARY: No rashes. No lesions.  MUSCULOSKELETAL: No arthritis. No swelling. No gout.  NEUROLOGIC: No numbness, tingling, or ataxia. No seizure-type activity.  PSYCHIATRIC: No anxiety. No insomnia. No ADD.    Vitals:   Vitals:   11/30/17 1946 12/01/17 0808 12/01/17 1112 12/01/17 1149  BP: (!) 146/85 107/84 (!) 175/52 (!) 156/61  Pulse: 74 62 63   Resp: 18 18    Temp: 97.7 F (36.5 C) (!) 97.5 F (36.4 C) 97.6 F (36.4 C)   TempSrc: Oral Oral Oral   SpO2: 90% 96%    Weight:      Height:        Wt Readings from Last 3 Encounters:  11/28/17 158 lb (71.7 kg)  10/04/17 158 lb (71.7 kg)  08/15/17 157 lb 8 oz (71.4 kg)     Intake/Output Summary (Last 24 hours) at 12/01/2017 1223 Last data filed at 12/01/2017 1010 Gross  per 24 hour  Intake 960 ml  Output 1100 ml  Net -140 ml    Physical Exam:   GENERAL: Pleasant-appearing in no apparent distress.  HEAD, EYES, EARS, NOSE AND THROAT: Atraumatic, normocephalic. Extraocular muscles are intact. Pupils equal and reactive to light. Sclerae anicteric. No conjunctival injection. No oro-pharyngeal erythema.  NECK: Supple. There is no jugular venous distention. No bruits, no lymphadenopathy, no thyromegaly.  HEART: Regular rate and rhythm,. No murmurs, no rubs, no clicks.  LUNGS: Clear to auscultation bilaterally without any accessory muscle usage ABDOMEN: Soft, flat, nontender, nondistended.  Has good bowel sounds. No hepatosplenomegaly appreciated.  EXTREMITIES: No evidence of any cyanosis, clubbing, or peripheral edema.  +2 pedal and radial pulses bilaterally.  NEUROLOGIC: The patient is alert, awake, and oriented x3 with no focal motor or sensory deficits appreciated bilaterally.  SKIN: Moist and warm with no rashes appreciated.  Psych: Not anxious, depressed LN: No inguinal LN enlargement    Antibiotics   Anti-infectives (From admission, onward)   Start     Dose/Rate Route Frequency Ordered Stop   11/28/17 1400  ceFAZolin (ANCEF) 1 g in dextrose 5 % 50 mL IVPB     1 g 120 mL/hr over 30 Minutes Intravenous Every 6 hours 11/28/17 1250 11/28/17 2237   11/28/17 1245  ceFAZolin (ANCEF) IVPB 1 g/50 mL premix  Status:  Discontinued     1 g 100 mL/hr over 30 Minutes Intravenous Every 6 hours 11/28/17 1236 11/28/17 1250   11/28/17 0701  ceFAZolin (ANCEF) 2-4 GM/100ML-% IVPB    Comments:  Jeanene Erb   : cabinet override      11/28/17 0701 11/28/17 1914   11/28/17 0700  clindamycin (CLEOCIN) 600 MG/50ML IVPB    Comments:  Jeanene Erb   : cabinet override      11/28/17 0700 11/28/17 0844   10/26/17 0600  ceFAZolin (ANCEF) IVPB 2g/100 mL premix     2 g 200 mL/hr over 30 Minutes Intravenous On call to O.R. 10/25/17 2152 10/27/17 0559   10/26/17 0600   ceFAZolin (ANCEF) 2-4 GM/100ML-% IVPB    Comments:  Larita Fife   : cabinet override      10/26/17 0600 10/26/17 1814      Medications   Scheduled Meds: . allopurinol  100 mg Oral Daily  . amLODipine  10 mg Oral Daily  . apixaban  5 mg Oral BID  . celecoxib  200 mg Oral Q12H  . [START ON 12/11/2017] cyanocobalamin  1,000 mcg Intramuscular Q30 days  . docusate sodium  100 mg Oral BID  . gabapentin  300 mg Oral TID  . levothyroxine  112 mcg Oral QAC breakfast  . lisinopril  40 mg Oral Daily  . metoprolol tartrate  25 mg Oral BID  . nitroGLYCERIN  0.5 inch Topical Q6H  . simvastatin  20 mg Oral QHS   Continuous Infusions: . methocarbamol (ROBAXIN)  IV     PRN Meds:.acetaminophen **OR** acetaminophen, bisacodyl, cloNIDine, hydrALAZINE, ipratropium-albuterol, menthol-cetylpyridinium **OR** phenol, methocarbamol **OR** methocarbamol (ROBAXIN)  IV, metoCLOPramide **OR** metoCLOPramide (REGLAN) injection, morphine injection, ondansetron **OR** ondansetron (ZOFRAN) IV, oxyCODONE, oxyCODONE, senna-docusate, sodium phosphate   Data Review:   Micro Results No results found for this or any previous visit (from the past 240 hour(s)).  Radiology Reports Dg Chest Port 1 View  Result Date: 11/29/2017 CLINICAL DATA:  Shortness of breath. The patient underwent knee replacement yesterday. History of hypertension, peripheral vascular disease and coronary artery disease, previous CVA. EXAM: PORTABLE CHEST 1 VIEW COMPARISON:  None in PACs FINDINGS: The lungs are adequately inflated. The interstitial markings are increased. There is patchy airspace opacity in the left perihilar and right infrahilar regions. The pulmonary vascularity is engorged. The cardiac silhouette is enlarged. There is no pleural effusion. There is calcification in the wall of the aortic arch. There are degenerative changes of the right AC joint. IMPRESSION: CHF with mild interstitial and early alveolar edema. Thoracic aortic  atherosclerosis. Electronically Signed   By: David  Martinique M.D.   On: 11/29/2017 11:04   Dg Knee Left Port  Result  Date: 11/28/2017 CLINICAL DATA:  Post left knee replacement EXAM: PORTABLE LEFT KNEE - 1-2 VIEW COMPARISON:  None. FINDINGS: Portable views of the left knee show the femoral and tibial components of the left total knee replacement to be in good position and alignment. No complicating features are seen. A small amount air is noted within soft tissues postoperatively and surgical drains are present. IMPRESSION: Left total knee replacement components in good position and alignment. No complicating features. Electronically Signed   By: Ivar Drape M.D.   On: 11/28/2017 11:47     CBC Recent Labs  Lab 11/28/17 0636 11/29/17 0605 11/30/17 0332 12/01/17 0613  WBC  --  17.3* 16.0* 14.4*  HGB 12.2 11.5* 11.1* 10.9*  HCT 36.0 34.5* 33.3* 31.5*  PLT  --  363 384 395  MCV  --  99.2 98.0 97.4  MCH  --  32.9 32.8 33.7  MCHC  --  33.2 33.4 34.6  RDW  --  14.3 14.3 14.7*    Chemistries  Recent Labs  Lab 11/28/17 0636 11/29/17 0605 11/30/17 0332 12/01/17 0613  NA 140 136 133* 127*  K 3.6 3.7 3.1* 4.8  CL  --  103 94* 87*  CO2  --  24 26 28   GLUCOSE 117* 121* 123* 122*  BUN  --  27* 29* 34*  CREATININE  --  1.57* 1.70* 1.80*  CALCIUM  --  8.7* 8.9 9.2   ------------------------------------------------------------------------------------------------------------------ estimated creatinine clearance is 24.3 mL/min (A) (by C-G formula based on SCr of 1.8 mg/dL (H)). ------------------------------------------------------------------------------------------------------------------ No results for input(s): HGBA1C in the last 72 hours. ------------------------------------------------------------------------------------------------------------------ No results for input(s): CHOL, HDL, LDLCALC, TRIG, CHOLHDL, LDLDIRECT in the last 72  hours. ------------------------------------------------------------------------------------------------------------------ No results for input(s): TSH, T4TOTAL, T3FREE, THYROIDAB in the last 72 hours.  Invalid input(s): FREET3 ------------------------------------------------------------------------------------------------------------------ No results for input(s): VITAMINB12, FOLATE, FERRITIN, TIBC, IRON, RETICCTPCT in the last 72 hours.  Coagulation profile No results for input(s): INR, PROTIME in the last 168 hours.  No results for input(s): DDIMER in the last 72 hours.  Cardiac Enzymes No results for input(s): CKMB, TROPONINI, MYOGLOBIN in the last 168 hours.  Invalid input(s): CK ------------------------------------------------------------------------------------------------------------------ Invalid input(s): POCBNP    Assessment & Plan   Assessment/Plan:  #Acute acute diastolic CHF resolved Due to patient's sodium being low I have asked her not to take Lasix  #Hyponatremia I have asked patient not to take her Lasix She will need a repeat BMP next week   # Accelerated hypertension Continue higher dose lisinopril I have increased her metoprolol dose for home I have also started on Norvasc I have recommended patient check her blood pressure twice daily and follow-up with her primary care provider next week   #Leukocytosis  Likely stress related WBC trending down Will need CBC repeated next week  #Paroxysmal atrial fibrillation rate controlled Continue Cardizem CD 120 mg and eliquis  #Hyperlipidemia continue Zocor  #Left knee arthroplasty  Pain management per ortho Continue Robaxin and morphine as needed for severe pain  #Chronic kidney disease Avoid nephrotoxins follow renal function closely  #Pulmonary hypertension needs outpatient pulmonary follow-up with likely sleep study  #Moderate mitral valve stenosis outpatient cardiology follow-up   Okay  for patient to be discharged home and follow-up with primary care provider next week have a CBC and a BMP done      Code Status Orders  (From admission, onward)        Start     Ordered   11/28/17 1237  Full code  Continuous     11/28/17 1236    Code Status History    Date Active Date Inactive Code Status Order ID Comments User Context   06/06/2017 18:02 06/07/2017 18:12 Full Code [id number]  Nicholes Mango, MD ED    Advance Directive Documentation     Most Recent Value  Type of Advance Directive  Healthcare Power of Attorney  Pre-existing out of facility DNR order (yellow form or pink MOST form)  No data  "MOST" Form in Place?  No data             DVT Prophylaxis on full dose anticoagulation Lab Results  Component Value Date   PLT 395 12/01/2017     Time Spent in minutes 35 minutes  Greater than 50% of time spent in care coordination and counseling patient regarding the condition and plan of care.   Dustin Flock M.D on 12/01/2017 at 12:23 PM  Between 7am to 6pm - Pager - [phone]  After 6pm go to [email or web address] - password [email or web address] [facility]   Office  [phone]

## 2017-12-01 NOTE — Progress Notes (Signed)
OT Cancellation Note  Patient Details Name: Caroline [name] MRN: [id number] DOB: [birth year]   Cancelled Treatment:    Reason Eval/Treat Not Completed: Other (comment)(Pt. and husband report no further OT needs at this time. Pt. husband reports he will be available to assist with ADLs, IADLs as needed. Pt. reports their daughters will also be available to assist. No further OT services are warranted at this time.)  Harrel Carina, MS, OTR/L 12/01/2017, 11:41 AM

## 2017-12-01 NOTE — Plan of Care (Signed)
  Progressing Education: Knowledge of General Education information will improve 12/01/2017 0525 - Progressing by Carrine Kroboth, Lucille Passy, RN Health Behavior/Discharge Planning: Ability to manage health-related needs will improve 12/01/2017 0525 - Progressing by Jasia Hiltunen, Lucille Passy, RN Clinical Measurements: Ability to maintain clinical measurements within normal limits will improve 12/01/2017 0525 - Progressing by Penney Domanski, Lucille Passy, RN Will remain free from infection 12/01/2017 0525 - Progressing by Santez Woodcox, Lucille Passy, RN Diagnostic test results will improve 12/01/2017 0525 - Progressing by Tniya Bowditch, Lucille Passy, RN Respiratory complications will improve 12/01/2017 0525 - Progressing by Bryna Colander, RN Cardiovascular complication will be avoided 12/01/2017 0525 - Progressing by Aretha Levi, Lucille Passy, RN Activity: Risk for activity intolerance will decrease 12/01/2017 0525 - Progressing by Bryna Colander, RN Nutrition: Adequate nutrition will be maintained 12/01/2017 0525 - Progressing by Bryna Colander, RN Coping: Level of anxiety will decrease 12/01/2017 0525 - Progressing by Bryna Colander, RN Elimination: Will not experience complications related to bowel motility 12/01/2017 0525 - Progressing by Adil Tugwell, Lucille Passy, RN Will not experience complications related to urinary retention 12/01/2017 0525 - Progressing by Zariyah Stephens, Lucille Passy, RN Pain Managment: General experience of comfort will improve 12/01/2017 0525 - Progressing by Jala Dundon, Lucille Passy, RN Safety: Ability to remain free from injury will improve 12/01/2017 0525 - Progressing by Terease Marcotte, Lucille Passy, RN Skin Integrity: Risk for impaired skin integrity will decrease 12/01/2017 0525 - Progressing by Bryna Colander, RN Education: Knowledge of the prescribed therapeutic regimen will improve 12/01/2017 0525 - Progressing by Teven Mittman, Lucille Passy, RN Activity: Ability to avoid complications of mobility  impairment will improve 12/01/2017 0525 - Progressing by Lyrah Bradt, Lucille Passy, RN Range of joint motion will improve 12/01/2017 0525 - Progressing by Bryna Colander, RN Clinical Measurements: Postoperative complications will be avoided or minimized 12/01/2017 0525 - Progressing by Bryna Colander, RN Pain Management: Pain level will decrease with appropriate interventions 12/01/2017 0525 - Progressing by Bryna Colander, RN Skin Integrity: Signs of wound healing will improve 12/01/2017 0525 - Progressing by Irais Mottram, Lucille Passy, RN

## 2017-12-01 NOTE — Progress Notes (Signed)
  Subjective:  POD #3 s/p left TKA.  Patient reports left knee pain as mild to moderate.  Patient's family is at the bedside. Patient is making progress with physical therapy.  Objective:   VITALS:   Vitals:   11/30/17 1946 12/01/17 0808 12/01/17 1112 12/01/17 1149  BP: (!) 146/85 107/84 (!) 175/52 (!) 156/61  Pulse: 74 62 63   Resp: 18 18    Temp: 97.7 F (36.5 C) (!) 97.5 F (36.4 C) 97.6 F (36.4 C)   TempSrc: Oral Oral Oral   SpO2: 90% 96%    Weight:      Height:        PHYSICAL EXAM: Left lower extremity:  I personally changed patient's dressing today. Neurovascular intact Sensation intact distally Intact pulses distally Dorsiflexion/Plantar flexion intact Incision: dressing C/D/I No cellulitis present Compartment soft  LABS  Results for orders placed or performed during the hospital encounter of 11/28/17 (from the past 24 hour(s))  CBC     Status: Abnormal   Collection Time: 12/01/17  6:13 AM  Result Value Ref Range   WBC 14.4 (H) 3.6 - 11.0 K/uL   RBC 3.24 (L) 3.80 - 5.20 MIL/uL   Hemoglobin 10.9 (L) 12.0 - 16.0 g/dL   HCT 31.5 (L) 35.0 - 47.0 %   MCV 97.4 80.0 - 100.0 fL   MCH 33.7 26.0 - 34.0 pg   MCHC 34.6 32.0 - 36.0 g/dL   RDW 14.7 (H) 11.5 - 14.5 %   Platelets 395 150 - 440 K/uL  Basic metabolic panel     Status: Abnormal   Collection Time: 12/01/17  6:13 AM  Result Value Ref Range   Sodium 127 (L) 135 - 145 mmol/L   Potassium 4.8 3.5 - 5.1 mmol/L   Chloride 87 (L) 101 - 111 mmol/L   CO2 28 22 - 32 mmol/L   Glucose, Bld 122 (H) 65 - 99 mg/dL   BUN 34 (H) 6 - 20 mg/dL   Creatinine, Ser 1.80 (H) 0.44 - 1.00 mg/dL   Calcium 9.2 8.9 - 10.3 mg/dL   GFR calc non Af Amer 26 (L) >60 mL/min   GFR calc Af Amer 30 (L) >60 mL/min   Anion gap 12 5 - 15    No results found.  Assessment/Plan: 3 Days Post-Op   Active Problems:   S/P total knee arthroplasty, left  Patient is doing well postop. Her pain is responding to the oxycodone. Patient is  making progress with physical therapy and is ready for discharge home. She'll follow up in our office in 1 week for reevaluation and wound check.    Thornton Park , MD 12/01/2017, 2:38 PM

## 2017-12-01 NOTE — Discharge Summary (Signed)
Physician Discharge Summary  Patient ID: Caroline [name] MRN: [id number] DOB/AGE: 1941/04/24 76 y.o.  Admit date: 11/28/2017 Discharge date: 12/01/2017  Admission Diagnoses:  M17.0 Bilateral primary osteoarthritis of knee <principal problem not specified>  Discharge Diagnoses:  M17.0 Bilateral primary osteoarthritis of knee Active Problems:   S/P total knee arthroplasty, left   Past Medical History:  Diagnosis Date  . Arthritis   . Chronic diastolic CHF (congestive heart failure) ([place])    a. TTE 05/2017: 65-70%, mild AI, moderate MR, mildly dilated LA  . Chronic kidney disease    STAGE 4  . History of kidney stones 01/2017  . Hypertension   . Hypothyroidism   . PAF (paroxysmal atrial fibrillation) ([place])    a. On Eliquis; b. CHADS2VASc => 8 (CHF, HTN, age x 2, stroke x 2, vascular disease, female)  . Stroke [facility])    TIA 9/17 & 04/06/2017  . Thyroid disease     Surgeries: Procedure(s): TOTAL KNEE ARTHROPLASTY on 11/28/2017   Consultants (if any): Treatment Team:  Minna Merritts, MD  Discharged Condition: Improved  Hospital Course: REVECA DESMARAIS is an 76 y.o. female who was admitted 11/28/2017 with a diagnosis of  M17.0 Bilateral primary osteoarthritis of knee <principal problem not specified> and went to the operating room on 11/28/2017 and underwent an uncomplicated left total knee arthroplasty.Marland Kitchen    She was given perioperative antibiotics:  Anti-infectives (From admission, onward)   Start     Dose/Rate Route Frequency Ordered Stop   11/28/17 1400  ceFAZolin (ANCEF) 1 g in dextrose 5 % 50 mL IVPB     1 g 120 mL/hr over 30 Minutes Intravenous Every 6 hours 11/28/17 1250 11/28/17 2237   11/28/17 1245  ceFAZolin (ANCEF) IVPB 1 g/50 mL premix  Status:  Discontinued     1 g 100 mL/hr over 30 Minutes Intravenous Every 6 hours 11/28/17 1236 11/28/17 1250   11/28/17 0701  ceFAZolin (ANCEF) 2-4 GM/100ML-% IVPB    Comments:  Jeanene Erb   : cabinet override      11/28/17  0701 11/28/17 1914   11/28/17 0700  clindamycin (CLEOCIN) 600 MG/50ML IVPB    Comments:  Jeanene Erb   : cabinet override      11/28/17 0700 11/28/17 0844   10/26/17 0600  ceFAZolin (ANCEF) IVPB 2g/100 mL premix     2 g 200 mL/hr over 30 Minutes Intravenous On call to O.R. 10/25/17 2152 10/27/17 0559   10/26/17 0600  ceFAZolin (ANCEF) 2-4 GM/100ML-% IVPB    Comments:  Larita Fife   : cabinet override      10/26/17 0600 10/26/17 1814     Patient was admitted to the orthopedic floor postoperatively. Her postoperative x-rays in the PACU showed the arthroplasty components were well positioned.  Patient was found to have hypertension postop. The hospital service was consult did. On postoperative day #1 the patient was noted to have wheezing on her morning exam by the nurse. Chest x-ray showed some fluid overload. This is treated with IV Lasix. Patient's breathing improved.  Patient received 24 hours of postop antibiotics. Her Foley catheter was removed on postop day #1. Patient's Hemovac drain was also removed on postop day #1.  She had labs drawn daily during her hospitalization. Her hematocrit and hemoglobin remained stable. Patient was seen by physical therapy beginning after surgery and continued throughout her hospitalization. Patient made good progress with therapy during her hospitalization.  Her dressing was changed on postoperative day #3. He is no  erythema or ecchymosis or drainage from her incision.  Given the patient's clinical improvement she is prepared for discharge home on postop day #3.Marland Kitchen   She was given sequential compression devices, early ambulation, and eliquis for DVT prophylaxis.  She benefited maximally from the hospital stay and there were no complications.    Recent vital signs:  Vitals:   12/01/17 1112 12/01/17 1149  BP: (!) 175/52 (!) 156/61  Pulse: 63   Resp:    Temp: 97.6 F (36.4 C)   SpO2:      Recent laboratory studies:  Lab Results   Component Value Date   HGB 10.9 (L) 12/01/2017   HGB 11.1 (L) 11/30/2017   HGB 11.5 (L) 11/29/2017   Lab Results  Component Value Date   WBC 14.4 (H) 12/01/2017   PLT 395 12/01/2017   Lab Results  Component Value Date   INR 1.50 10/04/2017   Lab Results  Component Value Date   NA 127 (L) 12/01/2017   K 4.8 12/01/2017   CL 87 (L) 12/01/2017   CO2 28 12/01/2017   BUN 34 (H) 12/01/2017   CREATININE 1.80 (H) 12/01/2017   GLUCOSE 122 (H) 12/01/2017    Discharge Medications:   Allergies as of 12/01/2017   No Known Allergies     Medication List    STOP taking these medications   furosemide 20 MG tablet Commonly known as:  LASIX   lisinopril-hydrochlorothiazide 20-25 MG tablet Commonly known as:  PRINZIDE,ZESTORETIC   simvastatin 20 MG tablet Commonly known as:  ZOCOR     TAKE these medications   acetaminophen 500 MG tablet Commonly known as:  TYLENOL Take 1,000 mg by mouth daily as needed (for pain.).   allopurinol 100 MG tablet Commonly known as:  ZYLOPRIM Take 100 mg by mouth daily.   amLODipine 10 MG tablet Commonly known as:  NORVASC Take 1 tablet (10 mg total) by mouth daily. Start taking on:  12/02/2017   apixaban 5 MG Tabs tablet Commonly known as:  ELIQUIS Take 1 tablet (5 mg total) by mouth 2 (two) times daily.   CARTIA XT 120 MG 24 hr capsule Generic drug:  diltiazem TAKE 1 CAPSULE BY MOUTH ONCE DAILY   cloNIDine 0.1 MG tablet Commonly known as:  CATAPRES Take 1 tablet (0.1 mg total) by mouth every 6 (six) hours as needed (SBP>170).   cyanocobalamin 1000 MCG/ML injection Commonly known as:  (VITAMIN B-12) Inject 1,000 mcg into the muscle every 30 (thirty) days.   KLOR-CON M10 10 MEQ tablet Generic drug:  potassium chloride Take 10 mEq by mouth 2 (two) times daily.   levothyroxine 112 MCG tablet Commonly known as:  SYNTHROID, LEVOTHROID Take 112 mcg by mouth daily before breakfast.   LINIMENTS EX Apply 1 application topically 3  (three) times daily as needed (for pain). Horse Lininment   lisinopril 40 MG tablet Commonly known as:  PRINIVIL,ZESTRIL Take 1 tablet (40 mg total) by mouth daily. Start taking on:  12/02/2017   metoprolol tartrate 50 MG tablet Commonly known as:  LOPRESSOR Take 1 tablet (50 mg total) by mouth 2 (two) times daily. What changed:    medication strength  how much to take   oxyCODONE 5 MG immediate release tablet Commonly known as:  Oxy IR/ROXICODONE Take 1 tablet (5 mg total) by mouth every 4 (four) hours as needed for moderate pain ((score 4 to 6)).   potassium chloride 10 MEQ CR capsule Commonly known as:  MICRO-K Take 40 mEq by  mouth 2 (two) times daily.   rosuvastatin 40 MG tablet Commonly known as:  CRESTOR Take 1 tablet (40 mg total) by mouth daily.            Durable Medical Equipment  (From admission, onward)        Start     Ordered   11/30/17 0948  For home use only DME Bedside commode  Once    Question:  Patient needs a bedside commode to treat with the following condition  Answer:  Weakness   11/30/17 0948   11/29/17 1401  For home use only DME Walker rolling  Once    Question:  Patient needs a walker to treat with the following condition  Answer:  Weakness   11/29/17 1400      Diagnostic Studies: Dg Chest Port 1 View  Result Date: 11/29/2017 CLINICAL DATA:  Shortness of breath. The patient underwent knee replacement yesterday. History of hypertension, peripheral vascular disease and coronary artery disease, previous CVA. EXAM: PORTABLE CHEST 1 VIEW COMPARISON:  None in PACs FINDINGS: The lungs are adequately inflated. The interstitial markings are increased. There is patchy airspace opacity in the left perihilar and right infrahilar regions. The pulmonary vascularity is engorged. The cardiac silhouette is enlarged. There is no pleural effusion. There is calcification in the wall of the aortic arch. There are degenerative changes of the right AC joint.  IMPRESSION: CHF with mild interstitial and early alveolar edema. Thoracic aortic atherosclerosis. Electronically Signed   By: David  Martinique M.D.   On: 11/29/2017 11:04   Dg Knee Left Port  Result Date: 11/28/2017 CLINICAL DATA:  Post left knee replacement EXAM: PORTABLE LEFT KNEE - 1-2 VIEW COMPARISON:  None. FINDINGS: Portable views of the left knee show the femoral and tibial components of the left total knee replacement to be in good position and alignment. No complicating features are seen. A small amount air is noted within soft tissues postoperatively and surgical drains are present. IMPRESSION: Left total knee replacement components in good position and alignment. No complicating features. Electronically Signed   By: Ivar Drape M.D.   On: 11/28/2017 11:47    Disposition: 01-Home or Self Care  Discharge Instructions    Call MD / Call 911   Complete by:  As directed    If you experience chest pain or shortness of breath, CALL 911 and be transported to the hospital emergency room.  If you develope a fever above 101 F, pus (white drainage) or increased drainage or redness at the wound, or calf pain, call your surgeon's office.   Constipation Prevention   Complete by:  As directed    Drink plenty of fluids.  Prune juice may be helpful.  You may use a stool softener, such as Colace (over the counter) 100 mg twice a day.  Use MiraLax (over the counter) for constipation as needed.   Diet - low sodium heart healthy   Complete by:  As directed    Discharge instructions   Complete by:  As directed    Patient should continue elevation at home for the next several days. Patient may use the Polar Care for comfort. Patient may be weightbearing as tolerated on the left lower extremity with use of her walker for assistance with ambulation. She will take her eloquent is for DVT prophylaxis. Patient may leave her dressing on her left knee until her next follow-up in the office. A shunt may work on home  exercises including knee motion. She  will follow-up in the office in 1 week for reevaluation and wound check.   Driving restrictions   Complete by:  As directed    No driving  Until after evluation at Dr. Harden Mo ofice.   Increase activity slowly as tolerated   Complete by:  As directed       Follow-up Information    Thornton Park, MD Follow up on 12/04/2017.   Specialty:  Orthopedic Surgery Why:  For first Outpatient physical therapy appointment at 1:00 pm Contact information: [place] [address] [phone]        Laverle Hobby, MD In 2 weeks.   Specialty:  Pulmonary Disease Why:  pulmonary hypertention/ office will call you at home with appointment. Contact information: [place] [address] [phone]            Signed: Thornton Park ,MD 12/01/2017, 2:50 PM

## 2017-12-01 NOTE — Progress Notes (Signed)
Physical Therapy Treatment Patient Details Name: Caroline [name] MRN: [id number] DOB: 10-19-1941 Today's Date: 12/01/2017    History of Present Illness Admitted for acute hospitalization status post L TKR (11/28/17).    PT Comments    Patient with mod SOB during exertional tasks, but sats maintained 88-89% on RA throughout session.  Encouraged patient/husband in strategies for activity pacing, signs/symptoms of fatigue. Patient continues to complete all mobility at cga level of assist, but with noted decrease in L knee control/stability with fatigue.  Constant cuing for TKE in loading phases required throughout session.  Did recommend patient/husband utilize KI at discharge for optimal safety.   Patient with mild confusion apparent at times during session; mild decrease in processing and higher-level problem-solving.  Husband present to receive all education/cuing this session. Patient also with episode of spontaneous vomiting during session (after gait).  Patient denied symptoms prior to episode; vitals stable and WFL.  RN informed/aware. Given slight concern with L knee control and overall safety with mobility, recommend transition to HHPT at discharge to ensure safety in home environment and consistent ability to enter/exit home.  RNCM informed/aware.   Follow Up Recommendations  Home health PT     Equipment Recommendations  Rolling walker with 5" wheels;3in1 (PT)    Recommendations for Other Services       Precautions / Restrictions Precautions Precautions: Fall;Knee Precaution Booklet Issued: No Required Braces or Orthoses: Knee Immobilizer - Left Knee Immobilizer - Left: Other (comment)(on when resting in bed/chair) Restrictions Weight Bearing Restrictions: Yes LLE Weight Bearing: Weight bearing as tolerated    Mobility  Bed Mobility Overal bed mobility: Modified Independent Bed Mobility: Supine to Sit     Supine to sit: Modified independent (Device/Increase time)      General bed mobility comments: able to negotiate L LE over edge of bed without assist this date, increased time requried  Transfers Overall transfer level: Needs assistance Equipment used: Rolling walker (2 wheeled) Transfers: Sit to/from Stand Sit to Stand: Min guard         General transfer comment: minimal use of L LE with movement transition  Ambulation/Gait Ambulation/Gait assistance: Min guard Ambulation Distance (Feet): 250 Feet Assistive device: Rolling walker (2 wheeled)       General Gait Details: mixed step to and step through gait pattern with decreased stance time L LE; min cuing for L knee control and TKE in loading phases of gait.  Control diminishes with fatigue; consistent cuing required.  Two standing rest breaks throughout distance due to fatigue, SOB; maintains sats >88-89%   Stairs            Wheelchair Mobility    Modified Rankin (Stroke Patients Only)       Balance                                            Cognition Arousal/Alertness: Awake/alert Behavior During Therapy: WFL for tasks assessed/performed Overall Cognitive Status: Within Functional Limits for tasks assessed                                 General Comments: mild decrease in problem-solving and higher-level task comprehension      Exercises Other Exercises Other Exercises: toilet transfer, SPT with RW, cga for safety; min cuing for walker position/management.  Min assist for  standing balance during hygiene and clothing management.  Educated on importance of maintianing single UE on RW at all times with standing tasks (LBD, oral care) to protect balance.  Patient voiced understanding; question full comprehension, as aptient with very limited integration/carry-over noted. Other Exercises: Educated patient/husband in [employer].  Given inconsistent stability of L knee (worsening with fatigue), encouraged KI on as much as possible during  initial transition to home.  Husband demonstrated understanding.    General Comments        Pertinent Vitals/Pain Pain Assessment: 0-10 Faces Pain Scale: Hurts little more Pain Location: L knee Pain Descriptors / Indicators: Sore;Operative site guarding Pain Intervention(s): Limited activity within patient's tolerance;Premedicated before session;Repositioned;Monitored during session    Home Living Family/patient expects to be discharged to:: Private residence                    Prior Function            PT Goals (current goals can now be found in the care plan section) Acute Rehab PT Goals Patient Stated Goal: To get stronger and go home PT Goal Formulation: With patient Time For Goal Achievement: 12/12/17 Potential to Achieve Goals: Good Progress towards PT goals: Progressing toward goals    Frequency    BID      PT Plan Discharge plan needs to be updated    Co-evaluation              AM-PAC PT "6 Clicks" Daily Activity  Outcome Measure  Difficulty turning over in bed (including adjusting bedclothes, sheets and blankets)?: A Little Difficulty moving from lying on back to sitting on the side of the bed? : A Little Difficulty sitting down on and standing up from a chair with arms (e.g., wheelchair, bedside commode, etc,.)?: Unable Help needed moving to and from a bed to chair (including a wheelchair)?: A Little Help needed walking in hospital room?: A Little Help needed climbing 3-5 steps with a railing? : A Little 6 Click Score: 16    End of Session Equipment Utilized During Treatment: Gait belt Activity Tolerance: Patient tolerated treatment well Patient left: with call bell/phone within reach;with family/visitor present;Other (comment);in chair;with chair alarm set Nurse Communication: Mobility status PT Visit Diagnosis: Muscle weakness (generalized) (M62.81);Difficulty in walking, not elsewhere classified (R26.2);Pain Pain - Right/Left:  Left Pain - part of body: Knee     Time: 1694-5038 PT Time Calculation (min) (ACUTE ONLY): 56 min  Charges:  $Gait Training: 8-22 mins $Therapeutic Activity: 38-52 mins                    G Codes:       Demeisha Geraghty H. Owens Shark, PT, DPT, NCS 12/01/17, 12:44 PM [phone]

## 2017-12-01 NOTE — Progress Notes (Signed)
PLEASE CHECK CBC AND BMP NEXT WEEK TO FOLLOW UP ON LOW SODIUM AND CBC FOR ELEVATED WBC

## 2017-12-01 NOTE — Progress Notes (Signed)
Progress Note  Patient Name: Caroline [name] Date of Encounter: 12/01/2017  Primary Cardiologist: Ida Rogue, MD  Subjective   Ambulating with physical therapy  breathing much improved. Preparing to go home today Maintained on Lasix 20 twice daily oral dosing past few days it   Inpatient Medications    Scheduled Meds: . allopurinol  100 mg Oral Daily  . amLODipine  10 mg Oral Daily  . apixaban  5 mg Oral BID  . celecoxib  200 mg Oral Q12H  . [START ON 12/11/2017] cyanocobalamin  1,000 mcg Intramuscular Q30 days  . docusate sodium  100 mg Oral BID  . gabapentin  300 mg Oral TID  . levothyroxine  112 mcg Oral QAC breakfast  . lisinopril  40 mg Oral Daily  . metoprolol tartrate  25 mg Oral BID  . nitroGLYCERIN  0.5 inch Topical Q6H  . simvastatin  20 mg Oral QHS   Continuous Infusions: . methocarbamol (ROBAXIN)  IV     PRN Meds: acetaminophen **OR** acetaminophen, bisacodyl, cloNIDine, hydrALAZINE, ipratropium-albuterol, menthol-cetylpyridinium **OR** phenol, methocarbamol **OR** methocarbamol (ROBAXIN)  IV, metoCLOPramide **OR** metoCLOPramide (REGLAN) injection, morphine injection, ondansetron **OR** ondansetron (ZOFRAN) IV, oxyCODONE, oxyCODONE, senna-docusate, sodium phosphate   Vital Signs    Vitals:   11/30/17 1946 12/01/17 0808 12/01/17 1112 12/01/17 1149  BP: (!) 146/85 107/84 (!) 175/52 (!) 156/61  Pulse: 74 62 63   Resp: 18 18    Temp: 97.7 F (36.5 C) (!) 97.5 F (36.4 C) 97.6 F (36.4 C)   TempSrc: Oral Oral Oral   SpO2: 90% 96%    Weight:      Height:        Intake/Output Summary (Last 24 hours) at 12/01/2017 1222 Last data filed at 12/01/2017 1010 Gross per 24 hour  Intake 960 ml  Output 1100 ml  Net -140 ml   Filed Weights   10/26/17 0614 11/28/17 0625  Weight: 155 lb (70.3 kg) 158 lb (71.7 kg)    Physical Exam   GEN: Well nourished, well developed, in no acute distress.  HEENT: Grossly normal.  Neck: Supple, JVP ~ 10 cm, carotid  bruits, or masses. Cardiac: RRR, no murmurs, rubs, or gallops. No clubbing, cyanosis, edema.  Radials/DP/PT 2+ and equal bilaterally.  Respiratory:  Respirations regular and unlabored, clear to auscultation bilaterally. GI: Soft, nontender, nondistended, BS + x 4. MS: no deformity or atrophy. Bandage in place left knee, healing surgical incision Skin: warm and dry, no rash. Neuro:  Strength and sensation are intact. Psych: AAOx3.  Normal affect.  Labs    Chemistry Recent Labs  Lab 11/29/17 [phone] 11/30/17 0332 12/01/17 0613  NA 136 133* 127*  K 3.7 3.1* 4.8  CL 103 94* 87*  CO2 24 26 28   GLUCOSE 121* 123* 122*  BUN 27* 29* 34*  CREATININE 1.57* 1.70* 1.80*  CALCIUM 8.7* 8.9 9.2  GFRNONAA 31* 28* 26*  GFRAA 36* 32* 30*  ANIONGAP 9 13 12      Hematology Recent Labs  Lab 11/29/17 0605 11/30/17 0332 12/01/17 0613  WBC 17.3* 16.0* 14.4*  RBC 3.48* 3.39* 3.24*  HGB 11.5* 11.1* 10.9*  HCT 34.5* 33.3* 31.5*  MCV 99.2 98.0 97.4  MCH 32.9 32.8 33.7  MCHC 33.2 33.4 34.6  RDW 14.3 14.3 14.7*  PLT 363 384 395   BNP Recent Labs  Lab 11/29/17 0605  BNP 537.0*    Radiology    No results found. Cardiac Studies   2D Echocardiogram 1.25.2018  Study  Conclusions   - Left ventricle: The cavity size was normal. There was moderate   concentric hypertrophy. Systolic function was normal. The   estimated ejection fraction was in the range of 55% to 60%. Wall   motion was normal; there were no regional wall motion   abnormalities. Features are consistent with a pseudonormal left   ventricular filling pattern, with concomitant abnormal relaxation   and increased filling pressure (grade 2 diastolic dysfunction). - Mitral valve: Calcified annulus. Mildly thickened, severely   calcified leaflets . The findings are consistent with moderate   stenosis. There was mild regurgitation. Mean gradient (D): 10 mm   Hg. Valve area by pressure half-time: 1.91 cm^2. - Left atrium: The  atrium was moderately dilated. - Pulmonary arteries: Systolic pressure was moderately increased.   PA peak pressure: 50 mm Hg (S). _____________   Patient Profile     76 y.o. female with a h/o HFpEF, HTN, PAF, and CKD III, who was admitted following L TKA and developed volume overload.  Assessment & Plan    1.  DJD s/p L TKA:   Per ortho.  2.  Acute on chronic diastolic CHF:   Nl EF by echo 12/2016.  Gr2 DD.  Mod MS. PASP 30mmhg.   Developed volume overload in the setting of HTN and IVF post-op.  Treated with IV Lasix twice daily transition to oral Lasix twice daily Would place back on Lasix 20 mg daily with extra Lasix as needed for weight gain, fluid retention  lab work reviewed showing climbing BUN and creatinine.  As above will decrease Lasix from twice daily down to daily dosing  3.  PAF:   Regular on exam.   Cont  blocker and eliquis.  4.  Hypertensive Heart Dzs:     Continue current medications stable,   5. CKD III-IV:   Creat up this am.  Back to Lasix daily not twice daily avoid HCTZ.  6. Moderate Mitral Stenosis:   Follow as an outpatient with periodic echocardiogram  Signed, Ida Rogue, MD  12/01/2017, 12:22 PM    For questions or updates, please contact   Please consult [email or web address] for contact info under Cardiology/STEMI.

## 2017-12-01 NOTE — Care Management (Signed)
Case dicussed with physical therapist. She feels patient would benefit from home health care with RN, PT, OT and HHA. Met with spouse regarding physical therapist recommendations.  He has discussed this with the patient and the patient would like to go to outpatient physical therapy. Spouse states that he, his daughter and his grand daughter will be caring for patient.

## 2017-12-02 LAB — URINE CULTURE: CULTURE: NO GROWTH

## 2017-12-05 ENCOUNTER — Emergency Department: Payer: Medicare Other

## 2017-12-05 ENCOUNTER — Other Ambulatory Visit: Payer: Self-pay

## 2017-12-05 ENCOUNTER — Inpatient Hospital Stay
Admission: EM | Admit: 2017-12-05 | Discharge: 2017-12-08 | DRG: 193 | Disposition: A | Discharge status: Home Health | Payer: Medicare Other | Attending: Internal Medicine | Admitting: Internal Medicine

## 2017-12-05 ENCOUNTER — Encounter (INDEPENDENT_AMBULATORY_CARE_PROVIDER_SITE_OTHER): Payer: Medicare Other

## 2017-12-05 ENCOUNTER — Encounter: Payer: Self-pay | Admitting: *Deleted

## 2017-12-05 ENCOUNTER — Ambulatory Visit (INDEPENDENT_AMBULATORY_CARE_PROVIDER_SITE_OTHER): Payer: Medicare Other | Admitting: Vascular Surgery

## 2017-12-05 DIAGNOSIS — E860 Dehydration: Secondary | ICD-10-CM | POA: Diagnosis present

## 2017-12-05 DIAGNOSIS — J189 Pneumonia, unspecified organism: Secondary | ICD-10-CM | POA: Diagnosis present

## 2017-12-05 DIAGNOSIS — R0902 Hypoxemia: Secondary | ICD-10-CM | POA: Diagnosis not present

## 2017-12-05 DIAGNOSIS — I4892 Unspecified atrial flutter: Secondary | ICD-10-CM | POA: Diagnosis not present

## 2017-12-05 DIAGNOSIS — E871 Hypo-osmolality and hyponatremia: Secondary | ICD-10-CM | POA: Diagnosis present

## 2017-12-05 DIAGNOSIS — I480 Paroxysmal atrial fibrillation: Secondary | ICD-10-CM | POA: Diagnosis not present

## 2017-12-05 DIAGNOSIS — Z87442 Personal history of urinary calculi: Secondary | ICD-10-CM | POA: Diagnosis not present

## 2017-12-05 DIAGNOSIS — J9601 Acute respiratory failure with hypoxia: Secondary | ICD-10-CM | POA: Diagnosis present

## 2017-12-05 DIAGNOSIS — I050 Rheumatic mitral stenosis: Secondary | ICD-10-CM | POA: Diagnosis present

## 2017-12-05 DIAGNOSIS — Z90710 Acquired absence of both cervix and uterus: Secondary | ICD-10-CM

## 2017-12-05 DIAGNOSIS — E039 Hypothyroidism, unspecified: Secondary | ICD-10-CM | POA: Diagnosis present

## 2017-12-05 DIAGNOSIS — I2510 Atherosclerotic heart disease of native coronary artery without angina pectoris: Secondary | ICD-10-CM | POA: Diagnosis present

## 2017-12-05 DIAGNOSIS — R296 Repeated falls: Secondary | ICD-10-CM | POA: Diagnosis present

## 2017-12-05 DIAGNOSIS — Z96652 Presence of left artificial knee joint: Secondary | ICD-10-CM | POA: Diagnosis present

## 2017-12-05 DIAGNOSIS — R0603 Acute respiratory distress: Secondary | ICD-10-CM | POA: Diagnosis not present

## 2017-12-05 DIAGNOSIS — E875 Hyperkalemia: Secondary | ICD-10-CM | POA: Diagnosis present

## 2017-12-05 DIAGNOSIS — E876 Hypokalemia: Secondary | ICD-10-CM | POA: Diagnosis not present

## 2017-12-05 DIAGNOSIS — E878 Other disorders of electrolyte and fluid balance, not elsewhere classified: Secondary | ICD-10-CM | POA: Diagnosis present

## 2017-12-05 DIAGNOSIS — N179 Acute kidney failure, unspecified: Secondary | ICD-10-CM | POA: Diagnosis present

## 2017-12-05 DIAGNOSIS — R0602 Shortness of breath: Secondary | ICD-10-CM | POA: Diagnosis present

## 2017-12-05 DIAGNOSIS — Z8673 Personal history of transient ischemic attack (TIA), and cerebral infarction without residual deficits: Secondary | ICD-10-CM

## 2017-12-05 DIAGNOSIS — I130 Hypertensive heart and chronic kidney disease with heart failure and stage 1 through stage 4 chronic kidney disease, or unspecified chronic kidney disease: Secondary | ICD-10-CM | POA: Diagnosis present

## 2017-12-05 DIAGNOSIS — N184 Chronic kidney disease, stage 4 (severe): Secondary | ICD-10-CM | POA: Diagnosis present

## 2017-12-05 DIAGNOSIS — I5043 Acute on chronic combined systolic (congestive) and diastolic (congestive) heart failure: Secondary | ICD-10-CM | POA: Diagnosis present

## 2017-12-05 DIAGNOSIS — I509 Heart failure, unspecified: Secondary | ICD-10-CM

## 2017-12-05 DIAGNOSIS — I5033 Acute on chronic diastolic (congestive) heart failure: Secondary | ICD-10-CM | POA: Diagnosis not present

## 2017-12-05 DIAGNOSIS — Z79899 Other long term (current) drug therapy: Secondary | ICD-10-CM

## 2017-12-05 DIAGNOSIS — Z7901 Long term (current) use of anticoagulants: Secondary | ICD-10-CM

## 2017-12-05 HISTORY — DX: Atherosclerotic heart disease of native coronary artery without angina pectoris: I25.10

## 2017-12-05 HISTORY — DX: Chronic kidney disease, stage 4 (severe): N18.4

## 2017-12-05 LAB — BASIC METABOLIC PANEL
Anion gap: 11 (ref 5–15)
Anion gap: 12 (ref 5–15)
Anion gap: 15 (ref 5–15)
BUN: 33 mg/dL — AB (ref 6–20)
BUN: 33 mg/dL — ABNORMAL HIGH (ref 6–20)
BUN: 34 mg/dL — ABNORMAL HIGH (ref 6–20)
CALCIUM: 9 mg/dL (ref 8.9–10.3)
CHLORIDE: 90 mmol/L — AB (ref 101–111)
CHLORIDE: 89 mmol/L — AB (ref 101–111)
CO2: 18 mmol/L — ABNORMAL LOW (ref 22–32)
CO2: 19 mmol/L — ABNORMAL LOW (ref 22–32)
CO2: 21 mmol/L — ABNORMAL LOW (ref 22–32)
CREATININE: 1.57 mg/dL — AB (ref 0.44–1.00)
CREATININE: 1.54 mg/dL — AB (ref 0.44–1.00)
CREATININE: 1.57 mg/dL — AB (ref 0.44–1.00)
Calcium: 9.3 mg/dL (ref 8.9–10.3)
Calcium: 9.2 mg/dL (ref 8.9–10.3)
Chloride: 89 mmol/L — ABNORMAL LOW (ref 101–111)
GFR calc Af Amer: 36 mL/min — ABNORMAL LOW (ref >60)
GFR calc non Af Amer: 31 mL/min — ABNORMAL LOW (ref >60)
GFR, EST AFRICAN AMERICAN: 36 mL/min — AB (ref >60)
GFR, EST AFRICAN AMERICAN: 36 mL/min — AB (ref >60)
GFR, EST NON AFRICAN AMERICAN: 31 mL/min — AB (ref >60)
GFR, EST NON AFRICAN AMERICAN: 31 mL/min — AB (ref >60)
GLUCOSE: 116 mg/dL — AB (ref 65–99)
Glucose, Bld: 168 mg/dL — ABNORMAL HIGH (ref 65–99)
Glucose, Bld: 137 mg/dL — ABNORMAL HIGH (ref 65–99)
POTASSIUM: 5.7 mmol/L — AB (ref 3.5–5.1)
POTASSIUM: 4.5 mmol/L (ref 3.5–5.1)
Potassium: 5.8 mmol/L — ABNORMAL HIGH (ref 3.5–5.1)
SODIUM: 121 mmol/L — AB (ref 135–145)
SODIUM: 125 mmol/L — AB (ref 135–145)
Sodium: 118 mmol/L — CL (ref 135–145)

## 2017-12-05 LAB — CBC WITH DIFFERENTIAL/PLATELET
BASOS PCT: 1 %
Basophils Absolute: 0.2 10*3/uL — ABNORMAL HIGH (ref 0–0.1)
EOS ABS: 0.2 10*3/uL (ref 0–0.7)
Eosinophils Relative: 1 %
HCT: 32.5 % — ABNORMAL LOW (ref 35.0–47.0)
Hemoglobin: 11.3 g/dL — ABNORMAL LOW (ref 12.0–16.0)
Lymphocytes Relative: 17 %
Lymphs Abs: 3.2 10*3/uL (ref 1.0–3.6)
MCH: 33.2 pg (ref 26.0–34.0)
MCHC: 34.7 g/dL (ref 32.0–36.0)
MCV: 95.9 fL (ref 80.0–100.0)
MONO ABS: 0.9 10*3/uL (ref 0.2–0.9)
Monocytes Relative: 5 %
NEUTROS ABS: 14.3 10*3/uL — AB (ref 1.4–6.5)
NEUTROS PCT: 76 %
PLATELETS: 616 10*3/uL — AB (ref 150–440)
RBC: 3.39 MIL/uL — ABNORMAL LOW (ref 3.80–5.20)
RDW: 14.6 % — AB (ref 11.5–14.5)
Smear Review: ADEQUATE
WBC: 18.8 10*3/uL — ABNORMAL HIGH (ref 3.6–11.0)

## 2017-12-05 LAB — COMPREHENSIVE METABOLIC PANEL
ALT: 30 U/L (ref 14–54)
AST: 32 U/L (ref 15–41)
Albumin: 3.5 g/dL (ref 3.5–5.0)
Alkaline Phosphatase: 93 U/L (ref 38–126)
Anion gap: 14 (ref 5–15)
BUN: 36 mg/dL — AB (ref 6–20)
CHLORIDE: 87 mmol/L — AB (ref 101–111)
CO2: 19 mmol/L — ABNORMAL LOW (ref 22–32)
CREATININE: 1.6 mg/dL — AB (ref 0.44–1.00)
Calcium: 9.6 mg/dL (ref 8.9–10.3)
GFR, EST AFRICAN AMERICAN: 35 mL/min — AB (ref >60)
GFR, EST NON AFRICAN AMERICAN: 30 mL/min — AB (ref >60)
Glucose, Bld: 139 mg/dL — ABNORMAL HIGH (ref 65–99)
POTASSIUM: 6.1 mmol/L — AB (ref 3.5–5.1)
Sodium: 120 mmol/L — ABNORMAL LOW (ref 135–145)
TOTAL PROTEIN: 7.4 g/dL (ref 6.5–8.1)
Total Bilirubin: 0.8 mg/dL (ref 0.3–1.2)

## 2017-12-05 LAB — GLUCOSE, CAPILLARY: Glucose-Capillary: 120 mg/dL — ABNORMAL HIGH (ref 65–99)

## 2017-12-05 LAB — TROPONIN I
Troponin I: <0.03 ng/mL (ref <0.03)
Troponin I: <0.03 ng/mL (ref <0.03)
Troponin I: <0.03 ng/mL (ref <0.03)

## 2017-12-05 LAB — BLOOD GAS, ARTERIAL
Acid-base deficit: 2.3 mmol/L — ABNORMAL HIGH (ref 0.0–2.0)
Bicarbonate: 20.6 mmol/L (ref 20.0–28.0)
Delivery systems: POSITIVE
EXPIRATORY PAP: 5
FIO2: 0.4
INSPIRATORY PAP: 10
O2 Saturation: 97.8 %
Patient temperature: 37
pCO2 arterial: 29 mmHg — ABNORMAL LOW (ref 32.0–48.0)
pH, Arterial: 7.46 — ABNORMAL HIGH (ref 7.350–7.450)
pO2, Arterial: 96 mmHg (ref 83.0–108.0)

## 2017-12-05 LAB — BRAIN NATRIURETIC PEPTIDE: B Natriuretic Peptide: 636 pg/mL — ABNORMAL HIGH (ref 0.0–100.0)

## 2017-12-05 LAB — MRSA PCR SCREENING: MRSA by PCR: NEGATIVE

## 2017-12-05 LAB — LACTIC ACID, PLASMA: Lactic Acid, Venous: 1.7 mmol/L (ref 0.5–1.9)

## 2017-12-05 LAB — PROCALCITONIN: Procalcitonin: <0.1 ng/mL

## 2017-12-05 MED ORDER — SIMVASTATIN 20 MG PO TABS
20.0000 mg | ORAL_TABLET | Freq: Every day | ORAL | Status: DC
  Administered 2017-12-05 – 2017-12-08 (×3): 20 mg via ORAL
  Filled 2017-12-05 (×3): qty 1

## 2017-12-05 MED ORDER — SENNOSIDES-DOCUSATE SODIUM 8.6-50 MG PO TABS: 1.0000 | ORAL_TABLET | Freq: Every evening | ORAL | Status: DC | PRN

## 2017-12-05 MED ORDER — DEXTROSE 5 % IV SOLN
2.0000 g | INTRAVENOUS | Status: DC
  Administered 2017-12-05 – 2017-12-06 (×2): 2 g via INTRAVENOUS
  Filled 2017-12-05 (×4): qty 2

## 2017-12-05 MED ORDER — METHYLPREDNISOLONE SODIUM SUCC 125 MG IJ SOLR
INTRAMUSCULAR | Status: AC
  Administered 2017-12-05: 125 mg via INTRAVENOUS
  Filled 2017-12-05: qty 2

## 2017-12-05 MED ORDER — METHYLPREDNISOLONE SODIUM SUCC 125 MG IJ SOLR
125.0000 mg | Freq: Once | INTRAMUSCULAR | Status: AC
  Administered 2017-12-05: 125 mg via INTRAVENOUS

## 2017-12-05 MED ORDER — METOPROLOL TARTRATE 50 MG PO TABS
50.0000 mg | ORAL_TABLET | Freq: Two times a day (BID) | ORAL | Status: DC
  Filled 2017-12-05: qty 1

## 2017-12-05 MED ORDER — ALLOPURINOL 100 MG PO TABS
100.0000 mg | ORAL_TABLET | Freq: Every day | ORAL | Status: DC
  Administered 2017-12-05 – 2017-12-08 (×3): 100 mg via ORAL
  Filled 2017-12-05 (×4): qty 1

## 2017-12-05 MED ORDER — SODIUM CHLORIDE 0.9 % IV SOLN: 250.0000 mL | INTRAVENOUS | Status: DC | PRN

## 2017-12-05 MED ORDER — FUROSEMIDE 20 MG PO TABS
20.0000 mg | ORAL_TABLET | Freq: Every day | ORAL | Status: DC
  Administered 2017-12-05 – 2017-12-06 (×2): 20 mg via ORAL
  Filled 2017-12-05 (×2): qty 1

## 2017-12-05 MED ORDER — DILTIAZEM HCL ER COATED BEADS 120 MG PO CP24
120.0000 mg | ORAL_CAPSULE | Freq: Every day | ORAL | Status: DC
  Filled 2017-12-05: qty 1

## 2017-12-05 MED ORDER — SODIUM POLYSTYRENE SULFONATE 15 GM/60ML PO SUSP
30.0000 g | Freq: Once | ORAL | Status: AC
  Administered 2017-12-05: 30 g via ORAL
  Filled 2017-12-05: qty 120

## 2017-12-05 MED ORDER — ONDANSETRON HCL 4 MG/2ML IJ SOLN: 4.0000 mg | Freq: Four times a day (QID) | INTRAMUSCULAR | Status: DC | PRN

## 2017-12-05 MED ORDER — SODIUM CHLORIDE 0.9 % IV SOLN: INTRAVENOUS | Status: DC

## 2017-12-05 MED ORDER — SODIUM CHLORIDE 0.9% FLUSH
3.0000 mL | Freq: Two times a day (BID) | INTRAVENOUS | Status: DC
  Administered 2017-12-05 – 2017-12-08 (×6): 3 mL via INTRAVENOUS

## 2017-12-05 MED ORDER — ACETAMINOPHEN 325 MG PO TABS
650.0000 mg | ORAL_TABLET | Freq: Four times a day (QID) | ORAL | Status: DC | PRN
  Administered 2017-12-06 – 2017-12-08 (×2): 650 mg via ORAL
  Filled 2017-12-05 (×2): qty 2

## 2017-12-05 MED ORDER — IPRATROPIUM-ALBUTEROL 0.5-2.5 (3) MG/3ML IN SOLN
RESPIRATORY_TRACT | Status: AC
  Filled 2017-12-05: qty 3

## 2017-12-05 MED ORDER — IOPAMIDOL (ISOVUE-370) INJECTION 76%
60.0000 mL | Freq: Once | INTRAVENOUS | Status: AC | PRN
  Administered 2017-12-05: 60 mL via INTRAVENOUS

## 2017-12-05 MED ORDER — IPRATROPIUM-ALBUTEROL 0.5-2.5 (3) MG/3ML IN SOLN
3.0000 mL | Freq: Once | RESPIRATORY_TRACT | Status: AC
  Administered 2017-12-05: 3 mL via RESPIRATORY_TRACT

## 2017-12-05 MED ORDER — ALBUTEROL SULFATE (2.5 MG/3ML) 0.083% IN NEBU: 2.5000 mg | INHALATION_SOLUTION | RESPIRATORY_TRACT | Status: DC | PRN

## 2017-12-05 MED ORDER — LEVOTHYROXINE SODIUM 112 MCG PO TABS
112.0000 ug | ORAL_TABLET | Freq: Every day | ORAL | Status: DC
  Administered 2017-12-05 – 2017-12-08 (×3): 112 ug via ORAL
  Filled 2017-12-05 (×4): qty 1

## 2017-12-05 MED ORDER — SODIUM CHLORIDE 0.9% FLUSH: 3.0000 mL | INTRAVENOUS | Status: DC | PRN

## 2017-12-05 MED ORDER — CEFTRIAXONE SODIUM IN DEXTROSE 20 MG/ML IV SOLN
1.0000 g | Freq: Once | INTRAVENOUS | Status: AC
  Administered 2017-12-05: 1 g via INTRAVENOUS
  Filled 2017-12-05: qty 50

## 2017-12-05 MED ORDER — OXYCODONE HCL 5 MG PO TABS
5.0000 mg | ORAL_TABLET | ORAL | Status: DC | PRN
  Administered 2017-12-05 – 2017-12-07 (×7): 5 mg via ORAL
  Filled 2017-12-05 (×7): qty 1

## 2017-12-05 MED ORDER — DEXTROSE 5 % IV SOLN: 500.0000 mg | INTRAVENOUS | Status: DC

## 2017-12-05 MED ORDER — ACETAMINOPHEN 650 MG RE SUPP: 650.0000 mg | Freq: Four times a day (QID) | RECTAL | Status: DC | PRN

## 2017-12-05 MED ORDER — SODIUM CHLORIDE 0.9 % IV BOLUS (SEPSIS)
500.0000 mL | Freq: Once | INTRAVENOUS | Status: AC
  Administered 2017-12-05: 500 mL via INTRAVENOUS

## 2017-12-05 MED ORDER — ONDANSETRON HCL 4 MG PO TABS: 4.0000 mg | ORAL_TABLET | Freq: Four times a day (QID) | ORAL | Status: DC | PRN

## 2017-12-05 MED ORDER — APIXABAN 5 MG PO TABS
5.0000 mg | ORAL_TABLET | Freq: Two times a day (BID) | ORAL | Status: DC
  Administered 2017-12-05 – 2017-12-08 (×7): 5 mg via ORAL
  Filled 2017-12-05 (×8): qty 1

## 2017-12-05 MED ORDER — AZITHROMYCIN 500 MG IV SOLR
500.0000 mg | Freq: Once | INTRAVENOUS | Status: DC
  Administered 2017-12-05: 500 mg via INTRAVENOUS
  Filled 2017-12-05: qty 500

## 2017-12-05 MED ORDER — SODIUM POLYSTYRENE SULFONATE 15 GM/60ML PO SUSP
15.0000 g | Freq: Once | ORAL | Status: AC
  Administered 2017-12-05: 15 g via ORAL
  Filled 2017-12-05: qty 60

## 2017-12-05 NOTE — Consult Note (Signed)
Name: Caroline [name] MRN: [id number] DOB: Aug 19, 1941    ADMISSION DATE:  12/05/2017 CONSULTATION DATE: 12/05/2017  REFERRING MD : Dr. Estanislado Pandy   CHIEF COMPLAINT: Shortness of Breath   BRIEF PATIENT DESCRIPTION:  76 yo female admitted with acute hypoxic respiratory failure secondary to pneumonia and pulmonary edema requiring Bipap and acute on chronic renal failure with hyperkalemia   SIGNIFICANT EVENTS  12/11-Pt admitted to [facility]   STUDIES:  CT Angio Chest 12/11>>No evidence of pulmonary embolus. Small bilateral pleural effusions. Patchy bilateral airspace opacities, most prominent at the right upper lobe and left lower lobe. This is concerning for pneumonia superimposed on pulmonary edema. Mildly enlarged 1.5 cm subcarinal node may reflect underlying infection. Scattered coronary artery calcifications. Calcification at the aortic and mitral valves. Cholelithiasis. Relatively diffuse calcification along the superior mesenteric artery and the proximal renal arteries bilaterally. Would correlate for evidence of refractory hypertension. 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  She presented to [facility] on 12/11 via EMS with shortness of breath and back pain onset of symptoms 3-4 days prior to presentation.  Per EMS arrival the pts O2 sats were 80%.  In the ER following transport to CT pt developed worsening respiratory distress requiring continuous Bipap.  CT Chest negative for pulmonary embolism, however bilateral pneumonia present.  It was also noted in the ER the pts bilateral feet were extremely cold with difficulty obtaining distal pulses in the left foot via palpation and auscultation.  After rewarming of bilateral feet strong pulses present via doppler CT Angio AO  Bifem ordered.  She was subsequently admitted to the stepdown unit by hospitalist team for further workup and treatment.    PAST MEDICAL HISTORY :   has a past medical history of Arthritis, Chronic diastolic CHF (congestive heart failure) ([place]), Chronic kidney disease, History of kidney stones (01/2017), Hypertension, Hypothyroidism, PAF (paroxysmal atrial fibrillation) ([place]), Stroke ([place]), and Thyroid disease.  has a past surgical history that includes Partial hysterectomy; Colonoscopy (2010); Abdominal hysterectomy; Cardiac catheterization; and Total knee arthroplasty (Left, 11/28/2017). Prior to Admission medications   Medication Sig Start Date End Date Taking? Authorizing Provider  allopurinol (ZYLOPRIM) 100 MG tablet Take 100 mg by mouth daily.  09/08/16  Yes [provider]  amLODipine (NORVASC) 10 MG tablet Take 1 tablet (10 mg total) by mouth daily. 12/02/17  Yes Dustin Flock, MD  apixaban (ELIQUIS) 5 MG TABS tablet Take 1 tablet (5 mg total) by mouth 2 (two) times daily. 08/15/17  Yes Gollan, Kathlene November, MD  CARTIA XT 120 MG 24 hr capsule TAKE 1 CAPSULE BY MOUTH ONCE DAILY 11/27/17  Yes Gollan, Kathlene November, MD  cloNIDine (CATAPRES) 0.1 MG tablet Take 1 tablet (0.1 mg total) by mouth every 6 (six) hours as needed (SBP>170). 12/01/17  Yes Thornton Park, MD  cyanocobalamin (,VITAMIN B-12,) 1000 MCG/ML injection Inject 1,000 mcg into the muscle every 30 (thirty) days.   Yes [provider]  KLOR-CON M10 10 MEQ tablet Take 10 mEq by mouth 2 (two) times daily. 09/15/17  Yes [provider]  levothyroxine (SYNTHROID, LEVOTHROID) 112 MCG tablet Take 112 mcg by mouth daily before breakfast. 09/05/17  Yes [provider]  lisinopril (PRINIVIL,ZESTRIL) 40 MG tablet Take 1 tablet (40 mg total) by mouth daily. 12/02/17  Yes Dustin Flock, MD  metoprolol tartrate (LOPRESSOR) 50 MG tablet Take 1 tablet (50 mg total) by mouth 2 (two) times daily. 12/01/17 12/01/18 Yes Dustin Flock, MD  oxyCODONE (OXY IR/ROXICODONE) 5 MG immediate release tablet Take 1 tablet (5 mg total) by mouth every 4 (four) hours as needed for moderate pain ((score 4 to 6)). 12/01/17  Yes Thornton Park, MD  simvastatin (ZOCOR) 20 MG tablet Take 20 mg by mouth daily.   Yes [provider]  acetaminophen (TYLENOL) 500 MG tablet Take 1,000 mg by mouth daily as needed (for pain.).    [provider]  LINIMENTS EX Apply 1 application topically 3 (three) times daily as needed (for pain). Horse Lininment    [provider]  potassium chloride (MICRO-K) 10 MEQ CR capsule Take 40 mEq by mouth 2 (two) times daily.    [provider]  rosuvastatin (CRESTOR) 40 MG tablet Take 1 tablet (40 mg total) by mouth daily. Patient not taking: Reported on 11/24/2017 07/14/17   Minna Merritts, MD   No Known Allergies  FAMILY HISTORY:  family history includes Heart attack (age of onset: 56) in her father; Heart disease in her brother; Hyperlipidemia in her father; Hypertension in her father. SOCIAL HISTORY:  reports that  has never smoked. she has never used smokeless tobacco. She reports that she drinks about 0.6 oz of alcohol per week. She reports that she does not use drugs.  REVIEW OF SYSTEMS: Positives in BOLD  Constitutional: Negative for fever, chills, weight loss, malaise/fatigue and diaphoresis.  HENT: Negative for hearing loss, ear pain, nosebleeds, congestion, sore throat, neck pain, tinnitus and ear discharge.   Eyes: Negative for blurred vision, double vision, photophobia, pain, discharge and redness.  Respiratory: cough, hemoptysis, sputum production, shortness of breath, wheezing and stridor.   Cardiovascular: chest pain, palpitations, orthopnea, claudication, left leg swelling and PND.  Gastrointestinal: Negative for heartburn, nausea, vomiting, abdominal pain, diarrhea, constipation, blood in stool and melena.  Genitourinary: Negative for dysuria, urgency,  frequency, hematuria and flank pain.  Musculoskeletal: Negative for myalgias, back pain, joint pain and falls.  Skin: Negative for itching and rash.  Neurological: Negative for dizziness, tingling, tremors, sensory change, speech change, focal weakness, seizures, loss of consciousness, weakness and headaches.  Endo/Heme/Allergies: Negative for environmental allergies and polydipsia. Does not bruise/bleed easily.  SUBJECTIVE:  Pt resting comfortably on Bipap   VITAL SIGNS: Temp:  [97.4 F (36.3 C)] 97.4 F (36.3 C) (12/11 0136) Pulse Rate:  [47-55] 48 (12/11 0500) Resp:  [13-20] 18 (12/11 0500) BP: (115-147)/(57-84) 132/58 (12/11 0500) SpO2:  [96 %-100 %] 100 % (12/11 0500) Weight:  [71.7 kg (158 lb)] 71.7 kg (158 lb) (12/11 0134)  PHYSICAL EXAMINATION: General: well developed, well nourished female NAD on Bipap  Neuro: alert and oriented, follows commands  HEENT: supple, no JVD  Cardiovascular: sinus brady, no M/R/G Lungs: rhonchi throughout, even, non labored  Abdomen: +BS x4, soft, non tender, non distended  Musculoskeletal: bilateral lower extremities cool to touch, 1+ palpable bilateral distal pulses, 1+ left lower extremity edema   Skin: left knee and hip incisions honeycomb dressing intact   Recent Labs  Lab 11/30/17 0332 12/01/17 0613 12/05/17 0141  NA 133* 127* 120*  K 3.1* 4.8 6.1*  CL 94* 87* 87*  CO2 26 28 19*  BUN 29* 34* 36*  CREATININE 1.70* 1.80* 1.60*  GLUCOSE 123* 122* 139*   Recent Labs  Lab 11/30/17 0332 12/01/17 0613 12/05/17 0141  HGB  11.1* 10.9* 11.3*  HCT 33.3* 31.5* 32.5*  WBC 16.0* 14.4* 18.8*  PLT 384 395 616*   Ct Angio Chest Pe W/cm &/or Wo Cm  Result Date: 12/05/2017 CLINICAL DATA:  Acute onset of shortness of breath. Recent knee replacement. Generalized chest pain and left toe discoloration. EXAM: CT ANGIOGRAPHY CHEST WITH CONTRAST TECHNIQUE: Multidetector CT imaging of the chest was performed using the standard protocol during  bolus administration of intravenous contrast. Multiplanar CT image reconstructions and MIPs were obtained to evaluate the vascular anatomy. CONTRAST:  65mL ISOVUE-370 IOPAMIDOL (ISOVUE-370) INJECTION 76% COMPARISON:  Chest radiograph performed earlier today at 1:38 a.m. FINDINGS: Cardiovascular: There is no evidence of pulmonary embolus. Evaluation for pulmonary embolus is suboptimal in areas of airspace consolidation. The heart is borderline normal in size. Calcification is noted at the aortic and mitral valves. Scattered coronary artery calcifications are seen. Mediastinum/Nodes: A mildly enlarged 1.5 cm subcarinal node is noted. No additional mediastinal lymphadenopathy is seen. No pericardial effusion is identified. The visualized portions of the thyroid gland are unremarkable. No axillary lymphadenopathy is appreciated. Lungs/Pleura: Small bilateral pleural effusions are noted. Patchy bilateral airspace opacities are noted, most prominent at the right upper lobe and left lower lobe. This is concerning for pneumonia superimposed on pulmonary edema. No pneumothorax is identified. No dominant mass is seen. Upper Abdomen: The visualized portions of the liver and spleen are unremarkable. Stones are noted dependently within the gallbladder. The visualized portions of the pancreas and adrenal glands are within normal limits. Minimal contrast is noted within the left renal calyces. Scattered calcification is seen along the proximal abdominal aorta, and relatively diffuse calcification is seen along the superior mesenteric artery and the proximal renal arteries bilaterally. Musculoskeletal: No acute osseous abnormalities are identified. The visualized musculature is unremarkable in appearance. Review of the MIP images confirms the above findings. IMPRESSION: 1. No evidence of pulmonary embolus. 2. Small bilateral pleural effusions. Patchy bilateral airspace opacities, most prominent at the right upper lobe and left  lower lobe. This is concerning for pneumonia superimposed on pulmonary edema. 3. Mildly enlarged 1.5 cm subcarinal node may reflect underlying infection. 4. Scattered coronary artery calcifications. Calcification at the aortic and mitral valves. 5. Cholelithiasis. 6. Relatively diffuse calcification along the superior mesenteric artery and the proximal renal arteries bilaterally. Would correlate for evidence of refractory hypertension. Aortic Atherosclerosis (ICD10-I70.0). Electronically Signed   By: Garald Balding M.D.   On: 12/05/2017 04:04   Dg Chest Port 1 View  Result Date: 12/05/2017 CLINICAL DATA:  Dyspnea.  Recent knee arthroplasty surgery. EXAM: PORTABLE CHEST 1 VIEW COMPARISON:  11/29/2017 FINDINGS: Central airspace opacities may represent pulmonary edema. This is worsened. Mild vascular and interstitial prominence. Unchanged cardiomegaly. IMPRESSION: Probable congestive heart failure. Infectious infiltrates are less likely. Electronically Signed   By: Andreas Newport M.D.   On: 12/05/2017 02:03    ASSESSMENT / PLAN: Acute hypoxic respiratory failure secondary to pneumonia and pulmonary edema  Acute on chronic renal failure with hyperkalemia  Severe hyponatremia  Leukocytosis Anemia without acute blood loss  Hx: Stroke, Paroxysmal Atrial Fibrillation, HTN, Chronic Systolic CHF  P: Prn Bipap for dyspnea and/or hypoxia Maintain O2 sats >92% Repeat CXR in am  Continue outpatient cardiac medications  Continuous telemetry monitoring Trend troponin's  Trend WBC and monitor fever curve Trend PCT  Follow cultures  Will change abx to cefepime for HCAP coverage if MRSA positive will start vancomycin  Will hold off on iv fluids for now due to pulmonary edema  Repeat BMP  Replace electrolytes as indicated Monitor UOP  Eliquis for VTE prophylaxis Trend CBC Monitor for s/sx of bleeding  Transfuse for hgb <7  Marda Stalker, [place] Pager [phone] (please  enter 7 digits) PCCM Consult Pager [phone] (please enter 7 digits)

## 2017-12-05 NOTE — ED Provider Notes (Signed)
[facility] Emergency Department Provider Note   ____________________________________________   First MD Initiated Contact with Patient 12/05/17 0140     (approximate)  I have reviewed the triage vital signs and the nursing notes.   HISTORY  Chief Complaint Respiratory Distress    HPI Caroline [name] is a 76 y.o. female brought to the ED from home via EMS with a chief complaint of shortness of breath.  Patient had left total knee replacement December 4.  Complains of progressive shortness of breath over the past 3-4 days.  Symptoms associated with thoracic back pain.  EMS reports room air saturations in the 80s upon their arrival.  States recovery has been fine from her surgery and has not noted increased swelling.  Denies associated fever, chills, cough, chest pain, abdominal pain, nausea or vomiting.  Denies recent travel or trauma.   Past Medical History:  Diagnosis Date  . Arthritis   . Chronic diastolic CHF (congestive heart failure) ([place])    a. TTE 05/2017: 65-70%, mild AI, moderate MR, mildly dilated LA  . Chronic kidney disease    STAGE 4  . History of kidney stones 01/2017  . Hypertension   . Hypothyroidism   . PAF (paroxysmal atrial fibrillation) ([place])    a. On Eliquis; b. CHADS2VASc => 8 (CHF, HTN, age x 2, stroke x 2, vascular disease, female)  . Stroke [facility])    TIA 9/17 & 04/06/2017  . Thyroid disease     Patient Active Problem List   Diagnosis Date Noted  . S/P total knee arthroplasty, left 11/28/2017  . Bilateral carotid artery stenosis 08/14/2017  . Paroxysmal atrial fibrillation ([place]) 07/14/2017  . CVA (cerebral vascular accident) ([place]) 06/07/2017  . TIA (transient ischemic attack) 10/18/2016  . Carotid stenosis 02/17/2014  . HTN (hypertension) 02/17/2014  . Hyperlipidemia 02/17/2014  . Coronary disease 02/17/2014  . Abnormal echocardiogram 02/17/2014    Past Surgical History:  Procedure Laterality Date  . ABDOMINAL  HYSTERECTOMY    . CARDIAC CATHETERIZATION     Negative   . COLONOSCOPY  2010  . PARTIAL HYSTERECTOMY    . TOTAL KNEE ARTHROPLASTY Left 11/28/2017   Procedure: TOTAL KNEE ARTHROPLASTY;  Surgeon: Thornton Park, MD;  Location: [facility] ORS;  Service: Orthopedics;  Laterality: Left;    Prior to Admission medications   Medication Sig Start Date End Date Taking? Authorizing Provider  allopurinol (ZYLOPRIM) 100 MG tablet Take 100 mg by mouth daily.  09/08/16  Yes [provider]  amLODipine (NORVASC) 10 MG tablet Take 1 tablet (10 mg total) by mouth daily. 12/02/17  Yes Dustin Flock, MD  apixaban (ELIQUIS) 5 MG TABS tablet Take 1 tablet (5 mg total) by mouth 2 (two) times daily. 08/15/17  Yes Gollan, Kathlene November, MD  CARTIA XT 120 MG 24 hr capsule TAKE 1 CAPSULE BY MOUTH ONCE DAILY 11/27/17  Yes Gollan, Kathlene November, MD  cloNIDine (CATAPRES) 0.1 MG tablet Take 1 tablet (0.1 mg total) by mouth every 6 (six) hours as needed (SBP>170). 12/01/17  Yes Thornton Park, MD  cyanocobalamin (,VITAMIN B-12,) 1000 MCG/ML injection Inject 1,000 mcg into the muscle every 30 (thirty) days.   Yes [provider]  KLOR-CON M10 10 MEQ tablet Take 10 mEq by mouth 2 (two) times daily. 09/15/17  Yes [provider]  levothyroxine (SYNTHROID, LEVOTHROID) 112 MCG tablet Take 112 mcg by mouth daily before breakfast. 09/05/17  Yes [provider]  lisinopril (PRINIVIL,ZESTRIL) 40 MG tablet Take 1 tablet (40  mg total) by mouth daily. 12/02/17  Yes Dustin Flock, MD  metoprolol tartrate (LOPRESSOR) 50 MG tablet Take 1 tablet (50 mg total) by mouth 2 (two) times daily. 12/01/17 12/01/18 Yes Dustin Flock, MD  oxyCODONE (OXY IR/ROXICODONE) 5 MG immediate release tablet Take 1 tablet (5 mg total) by mouth every 4 (four) hours as needed for moderate pain ((score 4 to 6)). 12/01/17  Yes Thornton Park, MD  simvastatin (ZOCOR) 20 MG tablet Take 20 mg by mouth daily.   Yes [provider]    acetaminophen (TYLENOL) 500 MG tablet Take 1,000 mg by mouth daily as needed (for pain.).    [provider]  LINIMENTS EX Apply 1 application topically 3 (three) times daily as needed (for pain). Horse Lininment    [provider]  potassium chloride (MICRO-K) 10 MEQ CR capsule Take 40 mEq by mouth 2 (two) times daily.    [provider]  rosuvastatin (CRESTOR) 40 MG tablet Take 1 tablet (40 mg total) by mouth daily. Patient not taking: Reported on 11/24/2017 07/14/17   Minna Merritts, MD    Allergies Patient has no known allergies.  Family History  Problem Relation Age of Onset  . Heart attack Father 44  . Hypertension Father   . Hyperlipidemia Father   . Heart disease Brother        valvular disease     Social History Social History   Tobacco Use  . Smoking status: Never Smoker  . Smokeless tobacco: Never Used  Substance Use Topics  . Alcohol use: Yes    Alcohol/week: 0.6 oz    Types: 1 Glasses of wine per week    Comment: social   . Drug use: No    Review of Systems  Constitutional: No fever/chills. Eyes: No visual changes. ENT: No sore throat. Cardiovascular: Denies chest pain. Respiratory: Positive for shortness of breath. Gastrointestinal: No abdominal pain.  No nausea, no vomiting.  No diarrhea.  No constipation. Genitourinary: Negative for dysuria. Musculoskeletal: Negative for back pain. Skin: Negative for rash. Neurological: Negative for headaches, focal weakness or numbness.   ____________________________________________   PHYSICAL EXAM:  VITAL SIGNS: ED Triage Vitals  Enc Vitals Group     BP 12/05/17 0136 (!) 147/75     Pulse Rate 12/05/17 0136 (!) 54     Resp 12/05/17 0136 20     Temp 12/05/17 0136 (!) 97.4 F (36.3 C)     Temp Source 12/05/17 0136 Axillary     SpO2 12/05/17 0136 97 %     Weight 12/05/17 0134 158 lb (71.7 kg)     Height 12/05/17 0134 5\' 2"  (1.575 m)     Head Circumference --      Peak Flow  --      Pain Score --      Pain Loc --      Pain Edu? --      Excl. in [place]? --     Constitutional: Alert and oriented. Well appearing and in moderate acute distress. Eyes: Conjunctivae are normal. PERRL. EOMI. Head: Atraumatic. Nose: No congestion/rhinnorhea. Mouth/Throat: Mucous membranes are moist.  Oropharynx non-erythematous. Neck: No stridor.   Cardiovascular: Normal rate, regular rhythm. Grossly normal heart sounds.  Good peripheral circulation. Respiratory: Normal respiratory effort.  No retractions. Lungs with scattered rhonchi. Gastrointestinal: Soft and nontender. No distention. No abdominal bruits. No CVA tenderness. Musculoskeletal:  LLE: Right knee immobilizer which was removed to examine knee.  Postoperative dressing intact.  Patient reports normal  postoperative swelling compared to right leg.  Toes on left foot appear cyanotic and dusky.  Delayed capillary refill.  Unable to palpate or auscultate distal pulses.  Patient denies pain and states her toes appear the same as always.  Left femoral pulse palpated.  Both feet are cold to the touch.  Patient was not wearing socks during transport. Neurologic:  Normal speech and language. No gross focal neurologic deficits are appreciated.  Skin:  Skin is warm, dry and intact. No rash noted. Psychiatric: Mood and affect are normal. Speech and behavior are normal.  ____________________________________________   LABS (all labs ordered are listed, but only abnormal results are displayed)  Labs Reviewed  CBC WITH DIFFERENTIAL/PLATELET - Abnormal; Notable for the following components:      Result Value   WBC 18.8 (*)    RBC 3.39 (*)    Hemoglobin 11.3 (*)    HCT 32.5 (*)    RDW 14.6 (*)    Platelets 616 (*)    Neutro Abs 14.3 (*)    Basophils Absolute 0.2 (*)    All other components within normal limits  COMPREHENSIVE METABOLIC PANEL - Abnormal; Notable for the following components:   Sodium 120 (*)    Potassium 6.1 (*)     Chloride 87 (*)    CO2 19 (*)    Glucose, Bld 139 (*)    BUN 36 (*)    Creatinine, Ser 1.60 (*)    GFR calc non Af Amer 30 (*)    GFR calc Af Amer 35 (*)    All other components within normal limits  BRAIN NATRIURETIC PEPTIDE - Abnormal; Notable for the following components:   B Natriuretic Peptide 636.0 (*)    All other components within normal limits  CULTURE, BLOOD (ROUTINE X 2)  CULTURE, BLOOD (ROUTINE X 2)  TROPONIN I  LACTIC ACID, PLASMA  LACTIC ACID, PLASMA  BLOOD GAS, ARTERIAL   ____________________________________________  EKG  ED ECG REPORT I, Orphia Mctigue J, the attending physician, personally viewed and interpreted this ECG.   Date: 12/05/2017  EKG Time: 0135  Rate: 54  Rhythm: sinus bradycardia  Axis: Normal  Intervals:LVH  ST&T Change: Nonspecific  ____________________________________________  RADIOLOGY  Ct Angio Chest Pe W/cm &/or Wo Cm  Result Date: 12/05/2017 CLINICAL DATA:  Acute onset of shortness of breath. Recent knee replacement. Generalized chest pain and left toe discoloration. EXAM: CT ANGIOGRAPHY CHEST WITH CONTRAST TECHNIQUE: Multidetector CT imaging of the chest was performed using the standard protocol during bolus administration of intravenous contrast. Multiplanar CT image reconstructions and MIPs were obtained to evaluate the vascular anatomy. CONTRAST:  69mL ISOVUE-370 IOPAMIDOL (ISOVUE-370) INJECTION 76% COMPARISON:  Chest radiograph performed earlier today at 1:38 a.m. FINDINGS: Cardiovascular: There is no evidence of pulmonary embolus. Evaluation for pulmonary embolus is suboptimal in areas of airspace consolidation. The heart is borderline normal in size. Calcification is noted at the aortic and mitral valves. Scattered coronary artery calcifications are seen. Mediastinum/Nodes: A mildly enlarged 1.5 cm subcarinal node is noted. No additional mediastinal lymphadenopathy is seen. No pericardial effusion is identified. The visualized portions of  the thyroid gland are unremarkable. No axillary lymphadenopathy is appreciated. Lungs/Pleura: Small bilateral pleural effusions are noted. Patchy bilateral airspace opacities are noted, most prominent at the right upper lobe and left lower lobe. This is concerning for pneumonia superimposed on pulmonary edema. No pneumothorax is identified. No dominant mass is seen. Upper Abdomen: The visualized portions of the liver and spleen are unremarkable. Stones are  noted dependently within the gallbladder. The visualized portions of the pancreas and adrenal glands are within normal limits. Minimal contrast is noted within the left renal calyces. Scattered calcification is seen along the proximal abdominal aorta, and relatively diffuse calcification is seen along the superior mesenteric artery and the proximal renal arteries bilaterally. Musculoskeletal: No acute osseous abnormalities are identified. The visualized musculature is unremarkable in appearance. Review of the MIP images confirms the above findings. IMPRESSION: 1. No evidence of pulmonary embolus. 2. Small bilateral pleural effusions. Patchy bilateral airspace opacities, most prominent at the right upper lobe and left lower lobe. This is concerning for pneumonia superimposed on pulmonary edema. 3. Mildly enlarged 1.5 cm subcarinal node may reflect underlying infection. 4. Scattered coronary artery calcifications. Calcification at the aortic and mitral valves. 5. Cholelithiasis. 6. Relatively diffuse calcification along the superior mesenteric artery and the proximal renal arteries bilaterally. Would correlate for evidence of refractory hypertension. Aortic Atherosclerosis (ICD10-I70.0). Electronically Signed   By: Garald Balding M.D.   On: 12/05/2017 04:04   Dg Chest Port 1 View  Result Date: 12/05/2017 CLINICAL DATA:  Dyspnea.  Recent knee arthroplasty surgery. EXAM: PORTABLE CHEST 1 VIEW COMPARISON:  11/29/2017 FINDINGS: Central airspace opacities may  represent pulmonary edema. This is worsened. Mild vascular and interstitial prominence. Unchanged cardiomegaly. IMPRESSION: Probable congestive heart failure. Infectious infiltrates are less likely. Electronically Signed   By: Andreas Newport M.D.   On: 12/05/2017 02:03    ____________________________________________   PROCEDURES  Procedure(s) performed: None  Procedures  Critical Care performed: Yes, see critical care note(s)   CRITICAL CARE Performed by: Paulette Blanch   Total critical care time: 45 minutes  Critical care time was exclusive of separately billable procedures and treating other patients.  Critical care was necessary to treat or prevent imminent or life-threatening deterioration.  Critical care was time spent personally by me on the following activities: development of treatment plan with patient and/or surrogate as well as nursing, discussions with consultants, evaluation of patient's response to treatment, examination of patient, obtaining history from patient or surrogate, ordering and performing treatments and interventions, ordering and review of laboratory studies, ordering and review of radiographic studies, pulse oximetry and re-evaluation of patient's condition.  ____________________________________________   INITIAL IMPRESSION / ASSESSMENT AND PLAN / ED COURSE  As part of my medical decision making, I reviewed the following data within the [place] reviewed, EKG interpreted, Old chart reviewed, Radiograph reviewed, Discussed with admitting physician and Notes from prior ED visits.   76 year old female presenting with shortness of breath in the setting of recent total knee repair. Differential includes, but is not limited to, viral syndrome, bronchitis including COPD exacerbation, pneumonia, reactive airway disease including asthma, CHF including exacerbation with or without pulmonary/interstitial edema, pneumothorax, ACS, thoracic  trauma, and pulmonary embolism.  Having difficulty palpating and auscultating distal pulses on left foot.  However, both feet are extremely cold due to the weather. Will warm them up and attempt to Doppler pulses again.  Will obtain screening lab work, chest x-ray, CT angiogram to evaluate pulmonary embolism and arterial clot.  Clinical Course as of Dec 06 423  Tue Dec 05, 2017  0304 Reexamined left toes after warming.  They now appear to be less dusky.  Capillary refill has improved.  Patient able to wiggle toes freely.  Attempted Doppler again.  Now am able to hear strong DP and PT pulses; this is verified with patient's nurse, patient and her husband.  Since her  GFR is marginal, and now I am less concerned for acute arterial occlusion, will discontinue CT angiogram of the lower extremity.  [JS]  0410 Patient with worsening respiratory difficulty after returning from CT scan.  I verify that she has had IV dye previously in 2016.  She states that she has had it previously without adverse reaction.  Patient is tripoding with wheezing.  There is no hives.  There is no angioedema.  Do not believe this is allergic reaction to IV dye.  Rather, patient had to lay flat for a period of time which I believe exacerbated her respiratory distress.  She is receiving DuoNeb treatments and IV Solu-Medrol.  Respiratory to obtain ABG and place patient on BiPAP.  In the interim, I have received the results of her CT scan which does not demonstrate pulmonary embolus but rather pneumonia.  Will obtain blood cultures and administer IV antibiotics for community-acquired pneumonia.  Patient's volume status is tenuous as her x-ray demonstrates volume overload, yet IV fluids would help her hyponatremia and pneumonia.  Will administer judicious fluids.  Lactate is normal; patient does not meet criteria for ED code sepsis based on her vital signs.  [JS]  7672 Patient already appears more comfortable after DuoNeb.  Respiratory  therapist at bedside placing BiPAP.  [JS]    Clinical Course User Index [JS] Paulette Blanch, MD     ____________________________________________   FINAL CLINICAL IMPRESSION(S) / ED DIAGNOSES  Final diagnoses:  Respiratory distress  Hyponatremia  Community acquired pneumonia, unspecified laterality  Hypoxia  Congestive heart failure, unspecified HF chronicity, unspecified heart failure type [facility])     ED Discharge Orders    None       Note:  This document was prepared using Dragon voice recognition software and may include unintentional dictation errors.    Paulette Blanch, MD 12/05/17 [id number]

## 2017-12-05 NOTE — ED Notes (Signed)
Report off to jennifer I rn.  

## 2017-12-05 NOTE — ED Notes (Signed)
Blood cultures drawn prior to antibiotics administration. Unable to label specimens d/t chart being locked in Facilities manager. Attempting to have issue resolved at this time.

## 2017-12-05 NOTE — Progress Notes (Signed)
Subjective:  Patient is seen in the ICU this afternoon. She is working with physical therapy. Patient is able to walk with a walker without significant oxygen desaturation. Patient reports left knee pain as mild.  Patient's husband is at the bedside. Patient explains that she has had progressively worsening shortness of breath over the weekend which prompted her visit to the emergency room yesterday.  Objective:   VITALS:   Vitals:   12/05/17 1300 12/05/17 1400 12/05/17 1500 12/05/17 1600  BP: 132/79 (!) 106/45 122/61 124/82  Pulse: 61 64 76   Resp: (!) 27 19 (!) 24   Temp:      TempSrc:      SpO2: 96% 97% 97%   Weight:      Height:        PHYSICAL EXAM: Left lower extremity: Neurovascular intact Sensation intact distally Intact pulses distally Dorsiflexion/Plantar flexion intact Incision: no drainage No cellulitis present Compartment soft  LABS  Results for orders placed or performed during the hospital encounter of 12/05/17 (from the past 24 hour(s))  CBC with Differential     Status: Abnormal   Collection Time: 12/05/17  1:41 AM  Result Value Ref Range   WBC 18.8 (H) 3.6 - 11.0 K/uL   RBC 3.39 (L) 3.80 - 5.20 MIL/uL   Hemoglobin 11.3 (L) 12.0 - 16.0 g/dL   HCT 32.5 (L) 35.0 - 47.0 %   MCV 95.9 80.0 - 100.0 fL   MCH 33.2 26.0 - 34.0 pg   MCHC 34.7 32.0 - 36.0 g/dL   RDW 14.6 (H) 11.5 - 14.5 %   Platelets 616 (H) 150 - 440 K/uL   Neutrophils Relative % 76 %   Lymphocytes Relative 17 %   Monocytes Relative 5 %   Eosinophils Relative 1 %   Basophils Relative 1 %   Neutro Abs 14.3 (H) 1.4 - 6.5 K/uL   Lymphs Abs 3.2 1.0 - 3.6 K/uL   Monocytes Absolute 0.9 0.2 - 0.9 K/uL   Eosinophils Absolute 0.2 0 - 0.7 K/uL   Basophils Absolute 0.2 (H) 0 - 0.1 K/uL   RBC Morphology MIXED RBC POPULATION    Smear Review      PLATELET CLUMPS NOTED ON SMEAR, COUNT APPEARS ADEQUATE  Comprehensive metabolic panel     Status: Abnormal   Collection Time: 12/05/17  1:41 AM  Result  Value Ref Range   Sodium 120 (L) 135 - 145 mmol/L   Potassium 6.1 (H) 3.5 - 5.1 mmol/L   Chloride 87 (L) 101 - 111 mmol/L   CO2 19 (L) 22 - 32 mmol/L   Glucose, Bld 139 (H) 65 - 99 mg/dL   BUN 36 (H) 6 - 20 mg/dL   Creatinine, Ser 1.60 (H) 0.44 - 1.00 mg/dL   Calcium 9.6 8.9 - 10.3 mg/dL   Total Protein 7.4 6.5 - 8.1 g/dL   Albumin 3.5 3.5 - 5.0 g/dL   AST 32 15 - 41 U/L   ALT 30 14 - 54 U/L   Alkaline Phosphatase 93 38 - 126 U/L   Total Bilirubin 0.8 0.3 - 1.2 mg/dL   GFR calc non Af Amer 30 (L) >60 mL/min   GFR calc Af Amer 35 (L) >60 mL/min   Anion gap 14 5 - 15  Troponin I     Status: None   Collection Time: 12/05/17  1:41 AM  Result Value Ref Range   Troponin I <0.03 <0.03 ng/mL  Lactic acid, plasma     Status:  None   Collection Time: 12/05/17  1:41 AM  Result Value Ref Range   Lactic Acid, Venous 1.7 0.5 - 1.9 mmol/L  Brain natriuretic peptide     Status: Abnormal   Collection Time: 12/05/17  1:41 AM  Result Value Ref Range   B Natriuretic Peptide 636.0 (H) 0.0 - 100.0 pg/mL  Blood gas, arterial     Status: Abnormal   Collection Time: 12/05/17  4:09 AM  Result Value Ref Range   FIO2 0.40    Delivery systems BILEVEL POSITIVE AIRWAY PRESSURE    Inspiratory PAP 10    Expiratory PAP 5.0    pH, Arterial 7.46 (H) 7.350 - 7.450   pCO2 arterial 29 (L) 32.0 - 48.0 mmHg   pO2, Arterial 96 83.0 - 108.0 mmHg   Bicarbonate 20.6 20.0 - 28.0 mmol/L   Acid-base deficit 2.3 (H) 0.0 - 2.0 mmol/L   O2 Saturation 97.8 %   Patient temperature 37.0    Collection site RIGHT RADIAL    Sample type ARTERIAL DRAW    Allens test (pass/fail) PASS PASS  MRSA PCR Screening     Status: None   Collection Time: 12/05/17  6:19 AM  Result Value Ref Range   MRSA by PCR NEGATIVE NEGATIVE  Glucose, capillary     Status: Abnormal   Collection Time: 12/05/17  6:26 AM  Result Value Ref Range   Glucose-Capillary 120 (H) 65 - 99 mg/dL  Basic metabolic panel     Status: Abnormal   Collection Time:  12/05/17  6:42 AM  Result Value Ref Range   Sodium 118 (LL) 135 - 145 mmol/L   Potassium 5.8 (H) 3.5 - 5.1 mmol/L   Chloride 89 (L) 101 - 111 mmol/L   CO2 18 (L) 22 - 32 mmol/L   Glucose, Bld 116 (H) 65 - 99 mg/dL   BUN 33 (H) 6 - 20 mg/dL   Creatinine, Ser 1.57 (H) 0.44 - 1.00 mg/dL   Calcium 9.0 8.9 - 10.3 mg/dL   GFR calc non Af Amer 31 (L) >60 mL/min   GFR calc Af Amer 36 (L) >60 mL/min   Anion gap 11 5 - 15  Procalcitonin - Baseline     Status: None   Collection Time: 12/05/17  6:42 AM  Result Value Ref Range   Procalcitonin <0.10 ng/mL  Troponin I     Status: None   Collection Time: 12/05/17  7:49 AM  Result Value Ref Range   Troponin I <0.03 <0.03 ng/mL  Troponin I     Status: None   Collection Time: 12/05/17  1:14 PM  Result Value Ref Range   Troponin I <0.03 <0.03 ng/mL  Basic metabolic panel     Status: Abnormal   Collection Time: 12/05/17  2:30 PM  Result Value Ref Range   Sodium 121 (L) 135 - 145 mmol/L   Potassium 5.7 (H) 3.5 - 5.1 mmol/L   Chloride 90 (L) 101 - 111 mmol/L   CO2 19 (L) 22 - 32 mmol/L   Glucose, Bld 168 (H) 65 - 99 mg/dL   BUN 33 (H) 6 - 20 mg/dL   Creatinine, Ser 1.54 (H) 0.44 - 1.00 mg/dL   Calcium 9.3 8.9 - 10.3 mg/dL   GFR calc non Af Amer 31 (L) >60 mL/min   GFR calc Af Amer 36 (L) >60 mL/min   Anion gap 12 5 - 15    Ct Angio Chest Pe W/cm &/or Wo Cm  Result Date: 12/05/2017 CLINICAL  DATA:  Acute onset of shortness of breath. Recent knee replacement. Generalized chest pain and left toe discoloration. EXAM: CT ANGIOGRAPHY CHEST WITH CONTRAST TECHNIQUE: Multidetector CT imaging of the chest was performed using the standard protocol during bolus administration of intravenous contrast. Multiplanar CT image reconstructions and MIPs were obtained to evaluate the vascular anatomy. CONTRAST:  52mL ISOVUE-370 IOPAMIDOL (ISOVUE-370) INJECTION 76% COMPARISON:  Chest radiograph performed earlier today at 1:38 a.m. FINDINGS: Cardiovascular: There is no  evidence of pulmonary embolus. Evaluation for pulmonary embolus is suboptimal in areas of airspace consolidation. The heart is borderline normal in size. Calcification is noted at the aortic and mitral valves. Scattered coronary artery calcifications are seen. Mediastinum/Nodes: A mildly enlarged 1.5 cm subcarinal node is noted. No additional mediastinal lymphadenopathy is seen. No pericardial effusion is identified. The visualized portions of the thyroid gland are unremarkable. No axillary lymphadenopathy is appreciated. Lungs/Pleura: Small bilateral pleural effusions are noted. Patchy bilateral airspace opacities are noted, most prominent at the right upper lobe and left lower lobe. This is concerning for pneumonia superimposed on pulmonary edema. No pneumothorax is identified. No dominant mass is seen. Upper Abdomen: The visualized portions of the liver and spleen are unremarkable. Stones are noted dependently within the gallbladder. The visualized portions of the pancreas and adrenal glands are within normal limits. Minimal contrast is noted within the left renal calyces. Scattered calcification is seen along the proximal abdominal aorta, and relatively diffuse calcification is seen along the superior mesenteric artery and the proximal renal arteries bilaterally. Musculoskeletal: No acute osseous abnormalities are identified. The visualized musculature is unremarkable in appearance. Review of the MIP images confirms the above findings. IMPRESSION: 1. No evidence of pulmonary embolus. 2. Small bilateral pleural effusions. Patchy bilateral airspace opacities, most prominent at the right upper lobe and left lower lobe. This is concerning for pneumonia superimposed on pulmonary edema. 3. Mildly enlarged 1.5 cm subcarinal node may reflect underlying infection. 4. Scattered coronary artery calcifications. Calcification at the aortic and mitral valves. 5. Cholelithiasis. 6. Relatively diffuse calcification along the  superior mesenteric artery and the proximal renal arteries bilaterally. Would correlate for evidence of refractory hypertension. Aortic Atherosclerosis (ICD10-I70.0). Electronically Signed   By: Garald Balding M.D.   On: 12/05/2017 04:04   Dg Chest Port 1 View  Result Date: 12/05/2017 CLINICAL DATA:  Dyspnea.  Recent knee arthroplasty surgery. EXAM: PORTABLE CHEST 1 VIEW COMPARISON:  11/29/2017 FINDINGS: Central airspace opacities may represent pulmonary edema. This is worsened. Mild vascular and interstitial prominence. Unchanged cardiomegaly. IMPRESSION: Probable congestive heart failure. Infectious infiltrates are less likely. Electronically Signed   By: Andreas Newport M.D.   On: 12/05/2017 02:03    Assessment/Plan:     Active Problems:   Pneumonia  Patient will continue with physical therapy as ordered. She is weightbearing as tolerated in the left lower extremity. Patient likely require home health PT upon discharge. Continue IV antibiotics as prescribed by the medical team. I appreciate the hospitalist and intensivist following this patient.  I will continue to follow the patient during this hospitalization.    Thornton Park , MD 12/05/2017, 4:48 PM

## 2017-12-05 NOTE — Progress Notes (Addendum)
Good afternoon. Walked with PT. Encouraged to move around more in room with help and walker. Discussed possible move to 1 A later tonight or tomorrow. Discussed potassium and sodium levels with family as well as need to continue her diurectic. Explained CHF versus Pneumonia. All families questions answered.

## 2017-12-05 NOTE — ED Notes (Signed)
Pt brought in via ems from home with sob.  Pt had knee replacement dec4.  Pt has knee immobilzer on left knee.  Pt has discoloration to left great toes, 2nd and 3rd toes,.     Pt reports chest pain tonight.  Sinus brady on monitor.  md at bedside.

## 2017-12-05 NOTE — Care Management (Signed)
Recent knee surgery with Dr. Thornton Park. She is not a Medicare Bundle case and she went to Outpatient PT per [facility] note earlier this month.  I have updated Dr. Cindi Carbon of patient's presentation to [facility].

## 2017-12-05 NOTE — Progress Notes (Signed)
[place] [facility] [place] NAME: Caroline [name]    MR#:  [id number]  DATE OF BIRTH:  [birth year]  SUBJECTIVE:  CHIEF COMPLAINT:   Chief Complaint  Patient presents with  . Respiratory Distress   - Recent left knee total replacement surgery done, admitted with hypoxic respiratory failure and was on BiPAP last night. Breathing is much improved. -Sodium low this morning with elevated potassium.  REVIEW OF SYSTEMS:  Review of Systems  Constitutional: Positive for malaise/fatigue. Negative for chills and fever.  HENT: Negative for congestion, ear discharge, hearing loss and nosebleeds.   Eyes: Negative for blurred vision and double vision.  Respiratory: Positive for shortness of breath. Negative for cough and wheezing.   Cardiovascular: Negative for chest pain, palpitations and leg swelling.  Gastrointestinal: Negative for abdominal pain, constipation, diarrhea, nausea and vomiting.  Genitourinary: Negative for dysuria.  Musculoskeletal: Positive for joint pain and myalgias.  Neurological: Negative for dizziness, focal weakness, seizures and headaches.  Psychiatric/Behavioral: Negative for depression.    DRUG ALLERGIES:  No Known Allergies  VITALS:  Blood pressure 113/90, pulse (!) 57, temperature 97.7 F (36.5 C), temperature source Axillary, resp. rate (!) 21, height 5\' 3"  (1.6 m), weight 79.4 kg (175 lb 0.7 oz), SpO2 98 %.  PHYSICAL EXAMINATION:  Physical Exam  GENERAL:  76 y.o.-year-old patient lying in the bed with no acute distress.  EYES: Pupils equal, round, reactive to light and accommodation. No scleral icterus. Extraocular muscles intact.  HEENT: Head atraumatic, normocephalic. Oropharynx and nasopharynx clear.  NECK:  Supple, no jugular venous distention. No thyroid enlargement, no tenderness. Rhonchi at the bases LUNGS: Normal breath sounds bilaterally, no wheezing, rales,rhonchi or crepitation. No use of accessory muscles of  respiration.  CARDIOVASCULAR: S1, S2 normal. No murmurs, rubs, or gallops.  ABDOMEN: Soft, nontender, nondistended. Bowel sounds present. No organomegaly or mass.  EXTREMITIES: No pedal edema, cyanosis, or clubbing. Left knee in brace NEUROLOGIC: Cranial nerves II through XII are intact. Muscle strength 5/5 in all extremities. Sensation intact. Gait not checked.  PSYCHIATRIC: The patient is alert and oriented x 3. Slow in conversation SKIN: No obvious rash, lesion, or ulcer.    LABORATORY PANEL:   CBC Recent Labs  Lab 12/05/17 0141  WBC 18.8*  HGB 11.3*  HCT 32.5*  PLT 616*   ------------------------------------------------------------------------------------------------------------------  Chemistries  Recent Labs  Lab 12/05/17 0141 12/05/17 0642  NA 120* 118*  K 6.1* 5.8*  CL 87* 89*  CO2 19* 18*  GLUCOSE 139* 116*  BUN 36* 33*  CREATININE 1.60* 1.57*  CALCIUM 9.6 9.0  AST 32  --   ALT 30  --   ALKPHOS 93  --   BILITOT 0.8  --    ------------------------------------------------------------------------------------------------------------------  Cardiac Enzymes Recent Labs  Lab 12/05/17 0749  TROPONINI <0.03   ------------------------------------------------------------------------------------------------------------------  RADIOLOGY:  Ct Angio Chest Pe W/cm &/or Wo Cm  Result Date: 12/05/2017 CLINICAL DATA:  Acute onset of shortness of breath. Recent knee replacement. Generalized chest pain and left toe discoloration. EXAM: CT ANGIOGRAPHY CHEST WITH CONTRAST TECHNIQUE: Multidetector CT imaging of the chest was performed using the standard protocol during bolus administration of intravenous contrast. Multiplanar CT image reconstructions and MIPs were obtained to evaluate the vascular anatomy. CONTRAST:  31mL ISOVUE-370 IOPAMIDOL (ISOVUE-370) INJECTION 76% COMPARISON:  Chest radiograph performed earlier today at 1:38 a.m. FINDINGS: Cardiovascular: There is no  evidence of pulmonary embolus. Evaluation for pulmonary embolus is suboptimal in areas of airspace consolidation.  The heart is borderline normal in size. Calcification is noted at the aortic and mitral valves. Scattered coronary artery calcifications are seen. Mediastinum/Nodes: A mildly enlarged 1.5 cm subcarinal node is noted. No additional mediastinal lymphadenopathy is seen. No pericardial effusion is identified. The visualized portions of the thyroid gland are unremarkable. No axillary lymphadenopathy is appreciated. Lungs/Pleura: Small bilateral pleural effusions are noted. Patchy bilateral airspace opacities are noted, most prominent at the right upper lobe and left lower lobe. This is concerning for pneumonia superimposed on pulmonary edema. No pneumothorax is identified. No dominant mass is seen. Upper Abdomen: The visualized portions of the liver and spleen are unremarkable. Stones are noted dependently within the gallbladder. The visualized portions of the pancreas and adrenal glands are within normal limits. Minimal contrast is noted within the left renal calyces. Scattered calcification is seen along the proximal abdominal aorta, and relatively diffuse calcification is seen along the superior mesenteric artery and the proximal renal arteries bilaterally. Musculoskeletal: No acute osseous abnormalities are identified. The visualized musculature is unremarkable in appearance. Review of the MIP images confirms the above findings. IMPRESSION: 1. No evidence of pulmonary embolus. 2. Small bilateral pleural effusions. Patchy bilateral airspace opacities, most prominent at the right upper lobe and left lower lobe. This is concerning for pneumonia superimposed on pulmonary edema. 3. Mildly enlarged 1.5 cm subcarinal node may reflect underlying infection. 4. Scattered coronary artery calcifications. Calcification at the aortic and mitral valves. 5. Cholelithiasis. 6. Relatively diffuse calcification along the  superior mesenteric artery and the proximal renal arteries bilaterally. Would correlate for evidence of refractory hypertension. Aortic Atherosclerosis (ICD10-I70.0). Electronically Signed   By: Garald Balding M.D.   On: 12/05/2017 04:04   Dg Chest Port 1 View  Result Date: 12/05/2017 CLINICAL DATA:  Dyspnea.  Recent knee arthroplasty surgery. EXAM: PORTABLE CHEST 1 VIEW COMPARISON:  11/29/2017 FINDINGS: Central airspace opacities may represent pulmonary edema. This is worsened. Mild vascular and interstitial prominence. Unchanged cardiomegaly. IMPRESSION: Probable congestive heart failure. Infectious infiltrates are less likely. Electronically Signed   By: Andreas Newport M.D.   On: 12/05/2017 02:03    EKG:   Orders placed or performed in visit on 12/05/17  . EKG 12-Lead    ASSESSMENT AND PLAN:   76 year old female with past medical history significant for CK D stage III, hypertension, diastolic CHF, paroxysmal atrial fibrillation, history of TIA and arthritis who recently had a left total knee replacement surgery done last week presents with acute hypoxic respiratory failure.  #1 acute hypoxic respiratory failure-secondary to eye basilar pneumonia. Recently hospitalized for left total knee replacement surgery. -MRSA PCR was negative, not in any vancomycin at this time. Continue cefepime. -Blood cultures are pending -Off BiPAP this morning and requiring 2 L oxygen via nasal cannula. -Appreciate intensivist consult  2. Hyponatremia and hypochloremia-could be dehydration. Will challenge with IV fluids first. -Consider nephrology consult if doesn't improve.  3. Hyperkalemia-patient was taking lisinopril and potassium supplements at home. Both of them are held at this time. Kayexalate will be given  4. CK D stage III-creatinine seems to be at baseline, however sodium is worsening so might need some fluids  5. Left total knee replacement surgery-stable. Orthopedics consult if  needed. -Continue brace and pain medications as needed  6. Chronic diastolic CHF-appears dehydrated at this time. Not in any diuretics at home. Monitor for now  7. Paroxysmal atrial fibrillation-heart rate well controlled. On metoprolol. On eliquis for anticoagulation   All the records are reviewed and case discussed  with Care Management/Social Workerr. Management plans discussed with the patient, family and they are in agreement.  CODE STATUS: Full Code  TOTAL TIME TAKING CARE OF THIS PATIENT: 38 minutes.   POSSIBLE D/C IN 2 DAYS, DEPENDING ON CLINICAL CONDITION.   Gladstone Lighter M.D on 12/05/2017 at 9:33 AM  Between 7am to 6pm - Pager - [phone]  After 6pm go to [email or web address] - password [email or web address] [place] [email or web address] [facility]  Office  [phone]  CC: Primary care physician; Remi Haggard, FNP

## 2017-12-05 NOTE — Progress Notes (Signed)
Told MD Kasa again that this AM's Na was 118. No new orders at this time. Will continue to monitor. Per MD Kasa recheck in PM.

## 2017-12-05 NOTE — ED Triage Notes (Signed)
Pt brought in via ems from home.  Pt has sob for 3-4 days.  Sx wore tonight.  Pt had left knee replacement dec 4.  Pt alert.

## 2017-12-05 NOTE — Progress Notes (Signed)
Pharmacy Antibiotic Note  Caroline [name] is a 76 y.o. female admitted on 12/05/2017 with pneumonia /HCAP.  Pharmacy has been consulted for cefepime dosing.  Plan: Cefepime 2 grams q 24 hours ordered.  Height: 5\' 2"  (157.5 cm) Weight: 158 lb (71.7 kg) IBW/kg (Calculated) : 50.1  Temp (24hrs), Avg:97.4 F (36.3 C), Min:97.4 F (36.3 C), Max:97.4 F (36.3 C)  Recent Labs  Lab 11/29/17 0605 11/30/17 0332 12/01/17 0613 12/05/17 0141  WBC 17.3* 16.0* 14.4* 18.8*  CREATININE 1.57* 1.70* 1.80* 1.60*  LATICACIDVEN  --   --   --  1.7    Estimated Creatinine Clearance: 27.3 mL/min (A) (by C-G formula based on SCr of 1.6 mg/dL (H)).    No Known Allergies  Antimicrobials this admission: Ceftriaxone 12/11 >> cefepime 12/11   Dose adjustments this admission:   Microbiology results: 12/11 BCx: pending 12/12 MRSA PCR: pending      12/11 CXR: "Probable congestive heart failure. Infectious infiltrates are less Likely."  Thank you for allowing pharmacy to be a part of this patient's care.  Khali Albanese S 12/05/2017 6:04 AM

## 2017-12-05 NOTE — Evaluation (Signed)
Physical Therapy Evaluation Patient Details Name: Caroline [name] MRN: [id number] DOB: 1941-12-19 Today's Date: 12/05/2017   History of Present Illness  76 y/o female who underwent L TKA 12/4, went home and returns with pneumonia/weakness.   Clinical Impression  Pt with L TKA 1 week ago and has been very weak and short of breath since going home, minimally active.  She is here with PNA, but O2 sats did stay in the 90s on room air t/o the PT exam including 18 minutes of exercises and 13 minutes of gait/mobility training apart from the actual exam.  Pt did surprisingly well and despite not doing much since d/c did not seem overly stiff in the knee (0-89) and showed functional strength (good quad control, 10 SLRs, etc).  Pt should be able to return home with husband and HHPT once medically cleared.     Follow Up Recommendations Home health PT    Equipment Recommendations       Recommendations for Other Services       Precautions / Restrictions Precautions Precautions: Fall;Knee Restrictions LLE Weight Bearing: Weight bearing as tolerated      Mobility  Bed Mobility Overal bed mobility: Modified Independent Bed Mobility: Supine to Sit           General bed mobility comments: Slow, labored effort to get to EOB, but able to do so w/o assist  Transfers Overall transfer level: Modified independent Equipment used: Rolling walker (2 wheeled) Transfers: Sit to/from Stand Sit to Stand: Min guard         General transfer comment: Pt needed reminders for hand placement and sequencing, but was able to rise w/o direct assist  Ambulation/Gait Ambulation/Gait assistance: Min guard Ambulation Distance (Feet): 100 Feet Assistive device: Rolling walker (2 wheeled)       General Gait Details: Pt initially with very slow, cautious ambulation but with light assist to advance walker more consistently she was able to keep up and then ultimately too multiple steps while maintaining  momentum on her own.  Pt did considerably better than expected given recent limitations.   Stairs            Wheelchair Mobility    Modified Rankin (Stroke Patients Only)       Balance Overall balance assessment: Needs assistance   Sitting balance-Leahy Scale: Good       Standing balance-Leahy Scale: Fair                               Pertinent Vitals/Pain Pain Assessment: No/denies pain Pain Score: 8     Home Living Family/patient expects to be discharged to:: Private residence Living Arrangements: Spouse/significant other Available Help at Discharge: Family;Available 24 hours/day Type of Home: House Home Access: Stairs to enter Entrance Stairs-Rails: Left Entrance Stairs-Number of Steps: 3 Home Layout: One level        Prior Function Level of Independence: Independent         Comments: Pt has been very weak since returning home last week, exhaustion with ambulation on just 20 ft      Hand Dominance        Extremity/Trunk Assessment   Upper Extremity Assessment Upper Extremity Assessment: Overall WFL for tasks assessed;Generalized weakness    Lower Extremity Assessment Lower Extremity Assessment: Overall WFL for tasks assessed;Generalized weakness(L LE grossly 4-/5)       Communication      Cognition Arousal/Alertness: Awake/alert Behavior During Therapy:  WFL for tasks assessed/performed Overall Cognitive Status: Impaired/Different from baseline                                 General Comments: Pt able to follow instructions, etc but per husband is not quite her normal self      General Comments      Exercises Total Joint Exercises Ankle Circles/Pumps: Strengthening;10 reps Quad Sets: Strengthening;10 reps(pt showed good quad control) Gluteal Sets: Strengthening;10 reps Heel Slides: Strengthening;10 reps Hip ABduction/ADduction: Strengthening;10 reps Straight Leg Raises: AROM;10 reps Knee Flexion:  PROM;AROM;5 reps Goniometric ROM: 0-89   Assessment/Plan    PT Assessment Patient needs continued PT services  PT Problem List Decreased strength;Decreased range of motion;Decreased activity tolerance;Decreased balance;Decreased mobility;Decreased coordination;Decreased cognition;Decreased knowledge of use of DME;Decreased safety awareness;Decreased knowledge of precautions;Pain;Decreased skin integrity       PT Treatment Interventions DME instruction;Stair training;Gait training;Functional mobility training;Therapeutic activities;Therapeutic exercise;Balance training;Patient/family education    PT Goals (Current goals can be found in the Care Plan section)  Acute Rehab PT Goals Patient Stated Goal: To get stronger and go home PT Goal Formulation: With patient Time For Goal Achievement: 12/19/17 Potential to Achieve Goals: Fair    Frequency 7X/week   Barriers to discharge        Co-evaluation               AM-PAC PT "6 Clicks" Daily Activity  Outcome Measure Difficulty turning over in bed (including adjusting bedclothes, sheets and blankets)?: A Little Difficulty moving from lying on back to sitting on the side of the bed? : A Little Difficulty sitting down on and standing up from a chair with arms (e.g., wheelchair, bedside commode, etc,.)?: A Little Help needed moving to and from a bed to chair (including a wheelchair)?: A Little Help needed walking in hospital room?: A Little Help needed climbing 3-5 steps with a railing? : A Lot 6 Click Score: 17    End of Session Equipment Utilized During Treatment: Gait belt Activity Tolerance: Patient tolerated treatment well Patient left: with call bell/phone within reach;in chair;with nursing/sitter in room;with family/visitor present   PT Visit Diagnosis: Muscle weakness (generalized) (M62.81);Difficulty in walking, not elsewhere classified (R26.2);Pain Pain - Right/Left: Left Pain - part of body: Knee    Time:  3354-5625 PT Time Calculation (min) (ACUTE ONLY): 61 min   Charges:   PT Evaluation $PT Eval Low Complexity: 1 Low PT Treatments $Gait Training: 8-22 mins $Therapeutic Exercise: 8-22 mins   PT G Codes:   PT G-Codes **NOT FOR INPATIENT CLASS** Functional Assessment Tool Used: AM-PAC 6 Clicks Basic Mobility Functional Limitation: Mobility: Walking and moving around Mobility: Walking and Moving Around Current Status (W3893): At least 40 percent but less than 60 percent impaired, limited or restricted Mobility: Walking and Moving Around Goal Status [phone]): At least 20 percent but less than 40 percent impaired, limited or restricted    Kreg Shropshire, DPT 12/05/2017, 4:51 PM

## 2017-12-05 NOTE — Care Management (Signed)
Patient husband, Roselie Awkward, stopped by 1 A to speak with this RNCM. He prefers patient go home with home health at discharge. Made him aware of Oren Beckmann, [facility] in ICU.  Also told him to let the case manager on the floor she transfers to know about his request for home health. He is very pleasant and appreciative. Case management following progression.

## 2017-12-05 NOTE — Progress Notes (Signed)
Transported pt to ICU on Bipap without incident. Pt tol well. Report given to ICU RT.

## 2017-12-05 NOTE — Progress Notes (Signed)
Notified MD Kasa that patients NA was 118. No new orders at this time.

## 2017-12-05 NOTE — H&P (Signed)
[facility] [place] NAME: Caroline [name]    MR#:  [id number]  DATE OF BIRTH:  19-Mar-1941  DATE OF ADMISSION:  12/05/2017  PRIMARY CARE PHYSICIAN: Remi Haggard, FNP   REQUESTING/REFERRING PHYSICIAN:   CHIEF COMPLAINT:   Chief Complaint  Patient presents with  . Respiratory Distress    HISTORY OF PRESENT ILLNESS: Caroline [name]  is a 76 y.o. female with a known history of diastolic heart failure, chronic kidney disease stage IV, hypertension, hypothyroidism, paroxysmal atrial fibrillation, CVA, thyroid disease and recently left total knee replacement done 1 week ago. Patient currently at home receiving rehabilitation therapy. She had shortness of breath and difficulty breathing for the last 2-3 days. In the emergency room her oxygen saturation was low she was put on BiPAP and stabilized. Has some cough but no complaints of any fever. Patient was worked up with CT chest which showed bilateral pneumonia and no evidence of any pulmonary embolism. Patient was started on IV antibiotics and hospitalist service was consulted. She also had a Doppler ultrasound of the left lower extremity to assess for pulsus which appeared to be intact because when patient initially presented to the emergency room room she had dusky discoloration of the toes left foot. Off late patient had frequent falls and has has multiple ecchymosis over the extremities.  PAST MEDICAL HISTORY:   Past Medical History:  Diagnosis Date  . Arthritis   . Chronic diastolic CHF (congestive heart failure) ([place])    a. TTE 05/2017: 65-70%, mild AI, moderate MR, mildly dilated LA  . Chronic kidney disease    STAGE 4  . History of kidney stones 01/2017  . Hypertension   . Hypothyroidism   . PAF (paroxysmal atrial fibrillation) ([place])    a. On Eliquis; b. CHADS2VASc => 8 (CHF, HTN, age x 2, stroke x 2, vascular disease, female)  . Stroke [facility])    TIA 9/17 & 04/06/2017  . Thyroid  disease     PAST SURGICAL HISTORY:  Past Surgical History:  Procedure Laterality Date  . ABDOMINAL HYSTERECTOMY    . CARDIAC CATHETERIZATION     Negative   . COLONOSCOPY  2010  . PARTIAL HYSTERECTOMY    . TOTAL KNEE ARTHROPLASTY Left 11/28/2017   Procedure: TOTAL KNEE ARTHROPLASTY;  Surgeon: Thornton Park, MD;  Location: [facility] ORS;  Service: Orthopedics;  Laterality: Left;    SOCIAL HISTORY:  Social History   Tobacco Use  . Smoking status: Never Smoker  . Smokeless tobacco: Never Used  Substance Use Topics  . Alcohol use: Yes    Alcohol/week: 0.6 oz    Types: 1 Glasses of wine per week    Comment: social     FAMILY HISTORY:  Family History  Problem Relation Age of Onset  . Heart attack Father 23  . Hypertension Father   . Hyperlipidemia Father   . Heart disease Brother        valvular disease     DRUG ALLERGIES: No Known Allergies  REVIEW OF SYSTEMS:   CONSTITUTIONAL: No fever, fatigue or weakness.  EYES: No blurred or double vision.  EARS, NOSE, AND THROAT: No tinnitus or ear pain.  RESPIRATORY: Has cough, shortness of breath,  No wheezing or hemoptysis.  CARDIOVASCULAR: No chest pain, orthopnea, edema.  GASTROINTESTINAL: No nausea, vomiting, diarrhea or abdominal pain.  GENITOURINARY: No dysuria, hematuria.  ENDOCRINE: No polyuria, nocturia,  HEMATOLOGY: No anemia, easy bruising or bleeding SKIN: No rash or lesion.  MUSCULOSKELETAL: No joint pain or arthritis.   Left knee pain NEUROLOGIC: No tingling, numbness, weakness.  PSYCHIATRY: No anxiety or depression.   MEDICATIONS AT HOME:  Prior to Admission medications   Medication Sig Start Date End Date Taking? Authorizing Provider  allopurinol (ZYLOPRIM) 100 MG tablet Take 100 mg by mouth daily.  09/08/16  Yes [provider]  amLODipine (NORVASC) 10 MG tablet Take 1 tablet (10 mg total) by mouth daily. 12/02/17  Yes Dustin Flock, MD  apixaban (ELIQUIS) 5 MG TABS tablet Take 1 tablet (5 mg  total) by mouth 2 (two) times daily. 08/15/17  Yes Gollan, Kathlene November, MD  CARTIA XT 120 MG 24 hr capsule TAKE 1 CAPSULE BY MOUTH ONCE DAILY 11/27/17  Yes Gollan, Kathlene November, MD  cloNIDine (CATAPRES) 0.1 MG tablet Take 1 tablet (0.1 mg total) by mouth every 6 (six) hours as needed (SBP>170). 12/01/17  Yes Thornton Park, MD  cyanocobalamin (,VITAMIN B-12,) 1000 MCG/ML injection Inject 1,000 mcg into the muscle every 30 (thirty) days.   Yes [provider]  KLOR-CON M10 10 MEQ tablet Take 10 mEq by mouth 2 (two) times daily. 09/15/17  Yes [provider]  levothyroxine (SYNTHROID, LEVOTHROID) 112 MCG tablet Take 112 mcg by mouth daily before breakfast. 09/05/17  Yes [provider]  lisinopril (PRINIVIL,ZESTRIL) 40 MG tablet Take 1 tablet (40 mg total) by mouth daily. 12/02/17  Yes Dustin Flock, MD  metoprolol tartrate (LOPRESSOR) 50 MG tablet Take 1 tablet (50 mg total) by mouth 2 (two) times daily. 12/01/17 12/01/18 Yes Dustin Flock, MD  oxyCODONE (OXY IR/ROXICODONE) 5 MG immediate release tablet Take 1 tablet (5 mg total) by mouth every 4 (four) hours as needed for moderate pain ((score 4 to 6)). 12/01/17  Yes Thornton Park, MD  simvastatin (ZOCOR) 20 MG tablet Take 20 mg by mouth daily.   Yes [provider]  acetaminophen (TYLENOL) 500 MG tablet Take 1,000 mg by mouth daily as needed (for pain.).    [provider]  LINIMENTS EX Apply 1 application topically 3 (three) times daily as needed (for pain). Horse Lininment    [provider]  potassium chloride (MICRO-K) 10 MEQ CR capsule Take 40 mEq by mouth 2 (two) times daily.    [provider]  rosuvastatin (CRESTOR) 40 MG tablet Take 1 tablet (40 mg total) by mouth daily. Patient not taking: Reported on 11/24/2017 07/14/17   Minna Merritts, MD      PHYSICAL EXAMINATION:   VITAL SIGNS: Blood pressure 123/90, pulse (!) 58, temperature (!) 97.4 F (36.3 C), temperature source  Axillary, resp. rate 20, height 5\' 2"  (1.575 m), weight 71.7 kg (158 lb), SpO2 100 %.  GENERAL:  76 y.o.-year-old patient lying in the bed with no acute distress.  EYES: Pupils equal, round, reactive to light and accommodation. No scleral icterus. Extraocular muscles intact.  HEENT: Head atraumatic, normocephalic. Oropharynx and nasopharynx clear.  NECK:  Supple, no jugular venous distention. No thyroid enlargement, no tenderness.  LUNGS: Decreased breath sounds bilaterally, rales heard in both lungs.  On bipap IPAP : 10 EPAP : 5 Rate : 8 FIO2 : 40% CARDIOVASCULAR: S1, S2 normal. No murmurs, rubs, or gallops.  ABDOMEN: Soft, nontender, nondistended. Bowel sounds present. No organomegaly or mass.  EXTREMITIES: Has left leg edema No discharge from surgical site Pulses present NEUROLOGIC: Cranial nerves II through XII are intact. Muscle strength 5/5 in all extremities. Sensation intact. Gait not checked.  PSYCHIATRIC: The patient is alert  and oriented x 3.  SKIN: No obvious rash, lesion, or ulcer.   LABORATORY PANEL:   CBC Recent Labs  Lab 11/28/17 0636 11/29/17 0605 11/30/17 0332 12/01/17 0613 12/05/17 0141  WBC  --  17.3* 16.0* 14.4* 18.8*  HGB 12.2 11.5* 11.1* 10.9* 11.3*  HCT 36.0 34.5* 33.3* 31.5* 32.5*  PLT  --  363 384 395 616*  MCV  --  99.2 98.0 97.4 95.9  MCH  --  32.9 32.8 33.7 33.2  MCHC  --  33.2 33.4 34.6 34.7  RDW  --  14.3 14.3 14.7* 14.6*  LYMPHSABS  --   --   --   --  3.2  MONOABS  --   --   --   --  0.9  EOSABS  --   --   --   --  0.2  BASOSABS  --   --   --   --  0.2*   ------------------------------------------------------------------------------------------------------------------  Chemistries  Recent Labs  Lab 11/28/17 0636 11/29/17 0605 11/30/17 0332 12/01/17 0613 12/05/17 0141  NA 140 136 133* 127* 120*  K 3.6 3.7 3.1* 4.8 6.1*  CL  --  103 94* 87* 87*  CO2  --  24 26 28  19*  GLUCOSE 117* 121* 123* 122* 139*  BUN  --  27* 29* 34* 36*   CREATININE  --  1.57* 1.70* 1.80* 1.60*  CALCIUM  --  8.7* 8.9 9.2 9.6  AST  --   --   --   --  32  ALT  --   --   --   --  30  ALKPHOS  --   --   --   --  93  BILITOT  --   --   --   --  0.8   ------------------------------------------------------------------------------------------------------------------ estimated creatinine clearance is 27.3 mL/min (A) (by C-G formula based on SCr of 1.6 mg/dL (H)). ------------------------------------------------------------------------------------------------------------------ No results for input(s): TSH, T4TOTAL, T3FREE, THYROIDAB in the last 72 hours.  Invalid input(s): FREET3   Coagulation profile No results for input(s): INR, PROTIME in the last 168 hours. ------------------------------------------------------------------------------------------------------------------- No results for input(s): DDIMER in the last 72 hours. -------------------------------------------------------------------------------------------------------------------  Cardiac Enzymes Recent Labs  Lab 12/05/17 0141  TROPONINI <0.03   ------------------------------------------------------------------------------------------------------------------ Invalid input(s): POCBNP  ---------------------------------------------------------------------------------------------------------------  Urinalysis    Component Value Date/Time   COLORURINE YELLOW (A) 11/30/2017 1331   APPEARANCEUR CLEAR (A) 11/30/2017 1331   LABSPEC 1.010 11/30/2017 1331   PHURINE 6.0 11/30/2017 1331   GLUCOSEU NEGATIVE 11/30/2017 1331   HGBUR NEGATIVE 11/30/2017 1331   BILIRUBINUR NEGATIVE 11/30/2017 1331   KETONESUR NEGATIVE 11/30/2017 1331   PROTEINUR NEGATIVE 11/30/2017 1331   NITRITE NEGATIVE 11/30/2017 1331   LEUKOCYTESUR NEGATIVE 11/30/2017 1331     RADIOLOGY: Ct Angio Chest Pe W/cm &/or Wo Cm  Result Date: 12/05/2017 CLINICAL DATA:  Acute onset of shortness of breath. Recent knee  replacement. Generalized chest pain and left toe discoloration. EXAM: CT ANGIOGRAPHY CHEST WITH CONTRAST TECHNIQUE: Multidetector CT imaging of the chest was performed using the standard protocol during bolus administration of intravenous contrast. Multiplanar CT image reconstructions and MIPs were obtained to evaluate the vascular anatomy. CONTRAST:  8mL ISOVUE-370 IOPAMIDOL (ISOVUE-370) INJECTION 76% COMPARISON:  Chest radiograph performed earlier today at 1:38 a.m. FINDINGS: Cardiovascular: There is no evidence of pulmonary embolus. Evaluation for pulmonary embolus is suboptimal in areas of airspace consolidation. The heart is borderline normal in size. Calcification is noted at the aortic and mitral valves. Scattered coronary  artery calcifications are seen. Mediastinum/Nodes: A mildly enlarged 1.5 cm subcarinal node is noted. No additional mediastinal lymphadenopathy is seen. No pericardial effusion is identified. The visualized portions of the thyroid gland are unremarkable. No axillary lymphadenopathy is appreciated. Lungs/Pleura: Small bilateral pleural effusions are noted. Patchy bilateral airspace opacities are noted, most prominent at the right upper lobe and left lower lobe. This is concerning for pneumonia superimposed on pulmonary edema. No pneumothorax is identified. No dominant mass is seen. Upper Abdomen: The visualized portions of the liver and spleen are unremarkable. Stones are noted dependently within the gallbladder. The visualized portions of the pancreas and adrenal glands are within normal limits. Minimal contrast is noted within the left renal calyces. Scattered calcification is seen along the proximal abdominal aorta, and relatively diffuse calcification is seen along the superior mesenteric artery and the proximal renal arteries bilaterally. Musculoskeletal: No acute osseous abnormalities are identified. The visualized musculature is unremarkable in appearance. Review of the MIP images  confirms the above findings. IMPRESSION: 1. No evidence of pulmonary embolus. 2. Small bilateral pleural effusions. Patchy bilateral airspace opacities, most prominent at the right upper lobe and left lower lobe. This is concerning for pneumonia superimposed on pulmonary edema. 3. Mildly enlarged 1.5 cm subcarinal node may reflect underlying infection. 4. Scattered coronary artery calcifications. Calcification at the aortic and mitral valves. 5. Cholelithiasis. 6. Relatively diffuse calcification along the superior mesenteric artery and the proximal renal arteries bilaterally. Would correlate for evidence of refractory hypertension. Aortic Atherosclerosis (ICD10-I70.0). Electronically Signed   By: Garald Balding M.D.   On: 12/05/2017 04:04   Dg Chest Port 1 View  Result Date: 12/05/2017 CLINICAL DATA:  Dyspnea.  Recent knee arthroplasty surgery. EXAM: PORTABLE CHEST 1 VIEW COMPARISON:  11/29/2017 FINDINGS: Central airspace opacities may represent pulmonary edema. This is worsened. Mild vascular and interstitial prominence. Unchanged cardiomegaly. IMPRESSION: Probable congestive heart failure. Infectious infiltrates are less likely. Electronically Signed   By: Andreas Newport M.D.   On: 12/05/2017 02:03    EKG: Orders placed or performed in visit on 12/05/17  . EKG 12-Lead    IMPRESSION AND PLAN: 76 year old female patient with history of recent left total knee replacement, diastolic heart failure, hypertension, hypothyroid, proximal atrial fibrillation presented to the emergency room with shortness of breath   Admitting diagnosis 1. Acute hypoxic respiratory failure 2. Bilateral pneumonia 3. Diastolic heart failure 4. Hyponatremia 5. Hyperkalemia Treatment plan Admit patient to stepdown unit Start patient on BiPAP IV Rocephin and Zithromax antibiotic Patient appears to be volume overloaded, fluid restriction Follow-up electrolytes Hold potassium supplements Oral Kayexalate for  hyperkalemia Resume cardiac medications Intensivist consultation  All the records are reviewed and case discussed with ED provider. Management plans discussed with the patient, family and they are in agreement.  CODE STATUS:FULL CODE Code Status History    Date Active Date Inactive Code Status Order ID Comments User Context   11/28/2017 12:37 12/01/2017 19:27 Full Code [id number]  Thornton Park, MD Inpatient   06/06/2017 18:02 06/07/2017 18:12 Full Code [id number]  Nicholes Mango, MD ED       TOTAL TIME TAKING CARE OF THIS PATIENT: 55 minutes.    Saundra Shelling M.D on 12/05/2017 at 5:48 AM  Between 7am to 6pm - Pager - [phone]  After 6pm go to [email or web address] - password [email or web address] [facility]  Office  [phone]  CC: Primary care physician; Remi Haggard, FNP

## 2017-12-06 LAB — URINALYSIS, COMPLETE (UACMP) WITH MICROSCOPIC
BILIRUBIN URINE: NEGATIVE
Bacteria, UA: NONE SEEN
GLUCOSE, UA: NEGATIVE mg/dL
HGB URINE DIPSTICK: NEGATIVE
KETONES UR: NEGATIVE mg/dL
LEUKOCYTES UA: NEGATIVE
NITRITE: NEGATIVE
PROTEIN: NEGATIVE mg/dL
RBC / HPF: NONE SEEN RBC/hpf (ref 0–5)
Specific Gravity, Urine: 1.005 (ref 1.005–1.030)
pH: 6 (ref 5.0–8.0)

## 2017-12-06 LAB — BASIC METABOLIC PANEL
ANION GAP: 12 (ref 5–15)
BUN: 34 mg/dL — ABNORMAL HIGH (ref 6–20)
CALCIUM: 9 mg/dL (ref 8.9–10.3)
CHLORIDE: 93 mmol/L — AB (ref 101–111)
CO2: 22 mmol/L (ref 22–32)
CREATININE: 1.63 mg/dL — AB (ref 0.44–1.00)
GFR calc non Af Amer: 29 mL/min — ABNORMAL LOW (ref >60)
GFR, EST AFRICAN AMERICAN: 34 mL/min — AB (ref >60)
Glucose, Bld: 123 mg/dL — ABNORMAL HIGH (ref 65–99)
Potassium: 3.7 mmol/L (ref 3.5–5.1)
SODIUM: 127 mmol/L — AB (ref 135–145)

## 2017-12-06 LAB — CBC
HCT: 30.6 % — ABNORMAL LOW (ref 35.0–47.0)
Hemoglobin: 10.2 g/dL — ABNORMAL LOW (ref 12.0–16.0)
MCH: 32.6 pg (ref 26.0–34.0)
MCHC: 33.4 g/dL (ref 32.0–36.0)
MCV: 97.5 fL (ref 80.0–100.0)
PLATELETS: 464 10*3/uL — AB (ref 150–440)
RBC: 3.13 MIL/uL — AB (ref 3.80–5.20)
RDW: 14.8 % — ABNORMAL HIGH (ref 11.5–14.5)
WBC: 29.8 10*3/uL — AB (ref 3.6–11.0)

## 2017-12-06 LAB — PROCALCITONIN

## 2017-12-06 NOTE — Consult Note (Addendum)
[facility] Infectious Disease     Reason for Consult: Pneumonia    Referring Physician: Claria Dice Date of Admission:  12/05/2017   Active Problems:   Pneumonia   HPI: Caroline [name] is a 76 y.o. female admitted from rehab facility with  Increasing sob and cough. On admit wbc was 18, temp 97, hyponatremia with na 118. She had recent L TKR about 1 week ago. CT showed multifocal infiltrate vs edema. Started on IV abx - cefepime -  wbc has increased but it does appear she received solumedrol. BCX negative.  Ortho has evaluated and no evidence joint infection.  She has history of diastolic heart failure, chronic kidney disease stage IV, hypertension, hypothyroidism, paroxysmal atrial fibrillation, CVA, thyroid disease.     Past Medical History:  Diagnosis Date  . Arthritis   . Chronic diastolic CHF (congestive heart failure) ([place])    a. TTE 05/2017: 65-70%, mild AI, moderate MR, mildly dilated LA  . Chronic kidney disease    STAGE 4  . History of kidney stones 01/2017  . Hypertension   . Hypothyroidism   . PAF (paroxysmal atrial fibrillation) ([place])    a. On Eliquis; b. CHADS2VASc => 8 (CHF, HTN, age x 2, stroke x 2, vascular disease, female)  . Stroke [facility])    TIA 9/17 & 04/06/2017  . Thyroid disease    Past Surgical History:  Procedure Laterality Date  . ABDOMINAL HYSTERECTOMY    . CARDIAC CATHETERIZATION     Negative   . COLONOSCOPY  2010  . PARTIAL HYSTERECTOMY    . TOTAL KNEE ARTHROPLASTY Left 11/28/2017   Procedure: TOTAL KNEE ARTHROPLASTY;  Surgeon: Thornton Park, MD;  Location: [facility] ORS;  Service: Orthopedics;  Laterality: Left;   Social History   Tobacco Use  . Smoking status: Never Smoker  . Smokeless tobacco: Never Used  Substance Use Topics  . Alcohol use: Yes    Alcohol/week: 0.6 oz    Types: 1 Glasses of wine per week    Comment: social   . Drug use: No   Family History  Problem Relation Age of Onset  . Heart attack Father 71  . Hypertension  Father   . Hyperlipidemia Father   . Heart disease Brother        valvular disease     Allergies: No Known Allergies  Current antibiotics: Antibiotics Given (last 72 hours)    Date/Time Action Medication Dose Rate   12/05/17 0441 New Bag/Given   cefTRIAXone (ROCEPHIN) 1 g in dextrose 5 % 50 mL IVPB - Premix 1 g 100 mL/hr   12/05/17 0601 New Bag/Given   azithromycin (ZITHROMAX) 500 mg in dextrose 5 % 250 mL IVPB 500 mg 250 mL/hr   12/05/17 0815 New Bag/Given   ceFEPIme (MAXIPIME) 2 g in dextrose 5 % 50 mL IVPB 2 g 100 mL/hr   12/06/17 0853 New Bag/Given  [medication not available]   ceFEPIme (MAXIPIME) 2 g in dextrose 5 % 50 mL IVPB 2 g 100 mL/hr      MEDICATIONS: . allopurinol  100 mg Oral Daily  . apixaban  5 mg Oral BID  . furosemide  20 mg Oral Daily  . levothyroxine  112 mcg Oral QAC breakfast  . simvastatin  20 mg Oral Daily  . sodium chloride flush  3 mL Intravenous Q12H    Review of Systems - 11 systems reviewed and negative per HPI   OBJECTIVE: Temp:  [97.6 F (36.4 C)-98.3 F (36.8 C)] 97.8  F (36.6 C) (12/12 0821) Pulse Rate:  [66-80] 78 (12/12 1158) Resp:  [18-42] 18 (12/12 0821) BP: (95-174)/(66-111) 147/75 (12/12 0826) SpO2:  [95 %-97 %] 95 % (12/12 1158) Physical Exam  Constitutional:  oriented to person, place, and time. appears well-developed and well-nourished. No distress.  HENT: [place]/AT, PERRLA, no scleral icterus Mouth/Throat: Oropharynx is clear and moist. No oropharyngeal exudate.  Cardiovascular: Normal rate, regular rhythm and normal heart sounds. Exam reveals no gallop and no friction rub.  No murmur heard.  Pulmonary/Chest: poor air movement and dec bs bil bases Neck = supple, no nuchal rigidity Abdominal: Soft. Bowel sounds are normal.  exhibits no distension. There is no tenderness.  Lymphadenopathy: no cervical adenopathy. No axillary adenopathy Neurological: alert and oriented to person, place, and time.  Skin: l knee site covered  with honeycomb dressing, appears intact and nontender Psychiatric: a normal mood and affect.  behavior is normal.    LABS: Results for orders placed or performed during the hospital encounter of 12/05/17 (from the past 48 hour(s))  CBC with Differential     Status: Abnormal   Collection Time: 12/05/17  1:41 AM  Result Value Ref Range   WBC 18.8 (H) 3.6 - 11.0 K/uL   RBC 3.39 (L) 3.80 - 5.20 MIL/uL   Hemoglobin 11.3 (L) 12.0 - 16.0 g/dL   HCT 32.5 (L) 35.0 - 47.0 %   MCV 95.9 80.0 - 100.0 fL   MCH 33.2 26.0 - 34.0 pg   MCHC 34.7 32.0 - 36.0 g/dL   RDW 14.6 (H) 11.5 - 14.5 %   Platelets 616 (H) 150 - 440 K/uL   Neutrophils Relative % 76 %   Lymphocytes Relative 17 %   Monocytes Relative 5 %   Eosinophils Relative 1 %   Basophils Relative 1 %   Neutro Abs 14.3 (H) 1.4 - 6.5 K/uL   Lymphs Abs 3.2 1.0 - 3.6 K/uL   Monocytes Absolute 0.9 0.2 - 0.9 K/uL   Eosinophils Absolute 0.2 0 - 0.7 K/uL   Basophils Absolute 0.2 (H) 0 - 0.1 K/uL   RBC Morphology MIXED RBC POPULATION    Smear Review      PLATELET CLUMPS NOTED ON SMEAR, COUNT APPEARS ADEQUATE  Comprehensive metabolic panel     Status: Abnormal   Collection Time: 12/05/17  1:41 AM  Result Value Ref Range   Sodium 120 (L) 135 - 145 mmol/L   Potassium 6.1 (H) 3.5 - 5.1 mmol/L   Chloride 87 (L) 101 - 111 mmol/L   CO2 19 (L) 22 - 32 mmol/L   Glucose, Bld 139 (H) 65 - 99 mg/dL   BUN 36 (H) 6 - 20 mg/dL   Creatinine, Ser 1.60 (H) 0.44 - 1.00 mg/dL   Calcium 9.6 8.9 - 10.3 mg/dL   Total Protein 7.4 6.5 - 8.1 g/dL   Albumin 3.5 3.5 - 5.0 g/dL   AST 32 15 - 41 U/L   ALT 30 14 - 54 U/L   Alkaline Phosphatase 93 38 - 126 U/L   Total Bilirubin 0.8 0.3 - 1.2 mg/dL   GFR calc non Af Amer 30 (L) >60 mL/min   GFR calc Af Amer 35 (L) >60 mL/min    Comment: (NOTE) The eGFR has been calculated using the CKD EPI equation. This calculation has not been validated in all clinical situations. eGFR's persistently <60 mL/min signify possible  Chronic Kidney Disease.    Anion gap 14 5 - 15  Troponin I  Status: None   Collection Time: 12/05/17  1:41 AM  Result Value Ref Range   Troponin I <0.03 <0.03 ng/mL  Lactic acid, plasma     Status: None   Collection Time: 12/05/17  1:41 AM  Result Value Ref Range   Lactic Acid, Venous 1.7 0.5 - 1.9 mmol/L  Brain natriuretic peptide     Status: Abnormal   Collection Time: 12/05/17  1:41 AM  Result Value Ref Range   B Natriuretic Peptide 636.0 (H) 0.0 - 100.0 pg/mL  Culture, blood (routine x 2)     Status: None (Preliminary result)   Collection Time: 12/05/17  3:45 AM  Result Value Ref Range   Specimen Description BLOOD LEFT ARM    Special Requests BOTTLES DRAWN AEROBIC AND ANAEROBIC BCHV    Culture NO GROWTH 1 DAY    Report Status PENDING   Blood gas, arterial     Status: Abnormal   Collection Time: 12/05/17  4:09 AM  Result Value Ref Range   FIO2 0.40    Delivery systems BILEVEL POSITIVE AIRWAY PRESSURE    Inspiratory PAP 10    Expiratory PAP 5.0    pH, Arterial 7.46 (H) 7.350 - 7.450   pCO2 arterial 29 (L) 32.0 - 48.0 mmHg   pO2, Arterial 96 83.0 - 108.0 mmHg   Bicarbonate 20.6 20.0 - 28.0 mmol/L   Acid-base deficit 2.3 (H) 0.0 - 2.0 mmol/L   O2 Saturation 97.8 %   Patient temperature 37.0    Collection site RIGHT RADIAL    Sample type ARTERIAL DRAW    Allens test (pass/fail) PASS PASS  Culture, blood (routine x 2)     Status: None (Preliminary result)   Collection Time: 12/05/17  4:34 AM  Result Value Ref Range   Specimen Description BLOOD LEFT ARM    Special Requests BOTTLES DRAWN AEROBIC AND ANAEROBIC BCAV    Culture NO GROWTH 1 DAY    Report Status PENDING   MRSA PCR Screening     Status: None   Collection Time: 12/05/17  6:19 AM  Result Value Ref Range   MRSA by PCR NEGATIVE NEGATIVE    Comment:        The GeneXpert MRSA Assay (FDA approved for NASAL specimens only), is one component of a comprehensive MRSA colonization surveillance program. It is  not intended to diagnose MRSA infection nor to guide or monitor treatment for MRSA infections.   Glucose, capillary     Status: Abnormal   Collection Time: 12/05/17  6:26 AM  Result Value Ref Range   Glucose-Capillary 120 (H) 65 - 99 mg/dL  Basic metabolic panel     Status: Abnormal   Collection Time: 12/05/17  6:42 AM  Result Value Ref Range   Sodium 118 (LL) 135 - 145 mmol/L    Comment: CRITICAL RESULT CALLED TO, READ BACK BY AND VERIFIED WITH [place] FOSTER AT 0623 12/05/17 DAS    Potassium 5.8 (H) 3.5 - 5.1 mmol/L   Chloride 89 (L) 101 - 111 mmol/L   CO2 18 (L) 22 - 32 mmol/L   Glucose, Bld 116 (H) 65 - 99 mg/dL   BUN 33 (H) 6 - 20 mg/dL   Creatinine, Ser 1.57 (H) 0.44 - 1.00 mg/dL   Calcium 9.0 8.9 - 10.3 mg/dL   GFR calc non Af Amer 31 (L) >60 mL/min   GFR calc Af Amer 36 (L) >60 mL/min    Comment: (NOTE) The eGFR has been calculated using the CKD EPI  equation. This calculation has not been validated in all clinical situations. eGFR's persistently <60 mL/min signify possible Chronic Kidney Disease.    Anion gap 11 5 - 15  Procalcitonin - Baseline     Status: None   Collection Time: 12/05/17  6:42 AM  Result Value Ref Range   Procalcitonin <0.10 ng/mL    Comment:        Interpretation: PCT (Procalcitonin) <= 0.5 ng/mL: Systemic infection (sepsis) is not likely. Local bacterial infection is possible. (NOTE)       Sepsis PCT Algorithm           Lower Respiratory Tract                                      Infection PCT Algorithm    ----------------------------     ----------------------------         PCT < 0.25 ng/mL                PCT < 0.10 ng/mL         Strongly encourage             Strongly discourage   discontinuation of antibiotics    initiation of antibiotics    ----------------------------     -----------------------------       PCT 0.25 - 0.50 ng/mL            PCT 0.10 - 0.25 ng/mL               OR       >80% decrease in PCT            Discourage  initiation of                                            antibiotics      Encourage discontinuation           of antibiotics    ----------------------------     -----------------------------         PCT >= 0.50 ng/mL              PCT 0.26 - 0.50 ng/mL               AND        <80% decrease in PCT             Encourage initiation of                                             antibiotics       Encourage continuation           of antibiotics    ----------------------------     -----------------------------        PCT >= 0.50 ng/mL                  PCT > 0.50 ng/mL               AND         increase in PCT                  Strongly encourage  initiation of antibiotics    Strongly encourage escalation           of antibiotics                                     -----------------------------                                           PCT <= 0.25 ng/mL                                                 OR                                        > 80% decrease in PCT                                     Discontinue / Do not initiate                                             antibiotics   Troponin I     Status: None   Collection Time: 12/05/17  7:49 AM  Result Value Ref Range   Troponin I <0.03 <0.03 ng/mL  Troponin I     Status: None   Collection Time: 12/05/17  1:14 PM  Result Value Ref Range   Troponin I <0.03 <0.03 ng/mL  Basic metabolic panel     Status: Abnormal   Collection Time: 12/05/17  2:30 PM  Result Value Ref Range   Sodium 121 (L) 135 - 145 mmol/L   Potassium 5.7 (H) 3.5 - 5.1 mmol/L   Chloride 90 (L) 101 - 111 mmol/L   CO2 19 (L) 22 - 32 mmol/L   Glucose, Bld 168 (H) 65 - 99 mg/dL   BUN 33 (H) 6 - 20 mg/dL   Creatinine, Ser 1.54 (H) 0.44 - 1.00 mg/dL   Calcium 9.3 8.9 - 10.3 mg/dL   GFR calc non Af Amer 31 (L) >60 mL/min   GFR calc Af Amer 36 (L) >60 mL/min    Comment: (NOTE) The eGFR has been calculated using the CKD EPI  equation. This calculation has not been validated in all clinical situations. eGFR's persistently <60 mL/min signify possible Chronic Kidney Disease.    Anion gap 12 5 - 15  Troponin I     Status: None   Collection Time: 12/05/17  7:00 PM  Result Value Ref Range   Troponin I <0.03 <0.03 ng/mL  Basic metabolic panel     Status: Abnormal   Collection Time: 12/05/17  9:39 PM  Result Value Ref Range   Sodium 125 (L) 135 - 145 mmol/L   Potassium 4.5 3.5 - 5.1 mmol/L   Chloride 89 (L) 101 - 111 mmol/L   CO2 21 (L) 22 - 32 mmol/L   Glucose, Bld 137 (H) 65 - 99 mg/dL  BUN 34 (H) 6 - 20 mg/dL   Creatinine, Ser 1.57 (H) 0.44 - 1.00 mg/dL   Calcium 9.2 8.9 - 10.3 mg/dL   GFR calc non Af Amer 31 (L) >60 mL/min   GFR calc Af Amer 36 (L) >60 mL/min    Comment: (NOTE) The eGFR has been calculated using the CKD EPI equation. This calculation has not been validated in all clinical situations. eGFR's persistently <60 mL/min signify possible Chronic Kidney Disease.    Anion gap 15 5 - 15  Basic metabolic panel     Status: Abnormal   Collection Time: 12/06/17  5:52 AM  Result Value Ref Range   Sodium 127 (L) 135 - 145 mmol/L   Potassium 3.7 3.5 - 5.1 mmol/L   Chloride 93 (L) 101 - 111 mmol/L   CO2 22 22 - 32 mmol/L   Glucose, Bld 123 (H) 65 - 99 mg/dL   BUN 34 (H) 6 - 20 mg/dL   Creatinine, Ser 1.63 (H) 0.44 - 1.00 mg/dL   Calcium 9.0 8.9 - 10.3 mg/dL   GFR calc non Af Amer 29 (L) >60 mL/min   GFR calc Af Amer 34 (L) >60 mL/min    Comment: (NOTE) The eGFR has been calculated using the CKD EPI equation. This calculation has not been validated in all clinical situations. eGFR's persistently <60 mL/min signify possible Chronic Kidney Disease.    Anion gap 12 5 - 15  CBC     Status: Abnormal   Collection Time: 12/06/17  5:52 AM  Result Value Ref Range   WBC 29.8 (H) 3.6 - 11.0 K/uL   RBC 3.13 (L) 3.80 - 5.20 MIL/uL   Hemoglobin 10.2 (L) 12.0 - 16.0 g/dL   HCT 30.6 (L) 35.0 - 47.0 %    MCV 97.5 80.0 - 100.0 fL   MCH 32.6 26.0 - 34.0 pg   MCHC 33.4 32.0 - 36.0 g/dL   RDW 14.8 (H) 11.5 - 14.5 %   Platelets 464 (H) 150 - 440 K/uL   No components found for: ESR, C REACTIVE PROTEIN MICRO: Recent Results (from the past 720 hour(s))  Urine Culture     Status: None   Collection Time: 11/30/17  7:24 PM  Result Value Ref Range Status   Specimen Description URINE, RANDOM  Final   Special Requests NONE  Final   Culture   Final    NO GROWTH Performed at [facility], [address] [place], [place] [address]    Report Status 12/02/2017 FINAL  Final  Culture, blood (routine x 2)     Status: None (Preliminary result)   Collection Time: 12/05/17  3:45 AM  Result Value Ref Range Status   Specimen Description BLOOD LEFT ARM  Final   Special Requests BOTTLES DRAWN AEROBIC AND ANAEROBIC [place]  Final   Culture NO GROWTH 1 DAY  Final   Report Status PENDING  Incomplete  Culture, blood (routine x 2)     Status: None (Preliminary result)   Collection Time: 12/05/17  4:34 AM  Result Value Ref Range Status   Specimen Description BLOOD LEFT ARM  Final   Special Requests BOTTLES DRAWN AEROBIC AND ANAEROBIC BCAV  Final   Culture NO GROWTH 1 DAY  Final   Report Status PENDING  Incomplete  MRSA PCR Screening     Status: None   Collection Time: 12/05/17  6:19 AM  Result Value Ref Range Status   MRSA by PCR NEGATIVE NEGATIVE Final  Comment:        The GeneXpert MRSA Assay (FDA approved for NASAL specimens only), is one component of a comprehensive MRSA colonization surveillance program. It is not intended to diagnose MRSA infection nor to guide or monitor treatment for MRSA infections.     IMAGING: Ct Angio Chest Pe W/cm &/or Wo Cm  Result Date: 12/05/2017 CLINICAL DATA:  Acute onset of shortness of breath. Recent knee replacement. Generalized chest pain and left toe discoloration. EXAM: CT ANGIOGRAPHY CHEST WITH CONTRAST TECHNIQUE: Multidetector CT imaging of the  chest was performed using the standard protocol during bolus administration of intravenous contrast. Multiplanar CT image reconstructions and MIPs were obtained to evaluate the vascular anatomy. CONTRAST:  26m ISOVUE-370 IOPAMIDOL (ISOVUE-370) INJECTION 76% COMPARISON:  Chest radiograph performed earlier today at 1:38 a.m. FINDINGS: Cardiovascular: There is no evidence of pulmonary embolus. Evaluation for pulmonary embolus is suboptimal in areas of airspace consolidation. The heart is borderline normal in size. Calcification is noted at the aortic and mitral valves. Scattered coronary artery calcifications are seen. Mediastinum/Nodes: A mildly enlarged 1.5 cm subcarinal node is noted. No additional mediastinal lymphadenopathy is seen. No pericardial effusion is identified. The visualized portions of the thyroid gland are unremarkable. No axillary lymphadenopathy is appreciated. Lungs/Pleura: Small bilateral pleural effusions are noted. Patchy bilateral airspace opacities are noted, most prominent at the right upper lobe and left lower lobe. This is concerning for pneumonia superimposed on pulmonary edema. No pneumothorax is identified. No dominant mass is seen. Upper Abdomen: The visualized portions of the liver and spleen are unremarkable. Stones are noted dependently within the gallbladder. The visualized portions of the pancreas and adrenal glands are within normal limits. Minimal contrast is noted within the left renal calyces. Scattered calcification is seen along the proximal abdominal aorta, and relatively diffuse calcification is seen along the superior mesenteric artery and the proximal renal arteries bilaterally. Musculoskeletal: No acute osseous abnormalities are identified. The visualized musculature is unremarkable in appearance. Review of the MIP images confirms the above findings. IMPRESSION: 1. No evidence of pulmonary embolus. 2. Small bilateral pleural effusions. Patchy bilateral airspace  opacities, most prominent at the right upper lobe and left lower lobe. This is concerning for pneumonia superimposed on pulmonary edema. 3. Mildly enlarged 1.5 cm subcarinal node may reflect underlying infection. 4. Scattered coronary artery calcifications. Calcification at the aortic and mitral valves. 5. Cholelithiasis. 6. Relatively diffuse calcification along the superior mesenteric artery and the proximal renal arteries bilaterally. Would correlate for evidence of refractory hypertension. Aortic Atherosclerosis (ICD10-I70.0). Electronically Signed   By: JGarald BaldingM.D.   On: 12/05/2017 04:04   Dg Chest Port 1 View  Result Date: 12/05/2017 CLINICAL DATA:  Dyspnea.  Recent knee arthroplasty surgery. EXAM: PORTABLE CHEST 1 VIEW COMPARISON:  11/29/2017 FINDINGS: Central airspace opacities may represent pulmonary edema. This is worsened. Mild vascular and interstitial prominence. Unchanged cardiomegaly. IMPRESSION: Probable congestive heart failure. Infectious infiltrates are less likely. Electronically Signed   By: DAndreas NewportM.D.   On: 12/05/2017 02:03   Dg Chest Port 1 View  Result Date: 11/29/2017 CLINICAL DATA:  Shortness of breath. The patient underwent knee replacement yesterday. History of hypertension, peripheral vascular disease and coronary artery disease, previous CVA. EXAM: PORTABLE CHEST 1 VIEW COMPARISON:  None in PACs FINDINGS: The lungs are adequately inflated. The interstitial markings are increased. There is patchy airspace opacity in the left perihilar and right infrahilar regions. The pulmonary vascularity is engorged. The cardiac silhouette is enlarged. There is no  pleural effusion. There is calcification in the wall of the aortic arch. There are degenerative changes of the right AC joint. IMPRESSION: CHF with mild interstitial and early alveolar edema. Thoracic aortic atherosclerosis. Electronically Signed   By: Kailoni Vahle  [place] M.D.   On: 11/29/2017 11:04   Dg Knee Left  Port  Result Date: 11/28/2017 CLINICAL DATA:  Post left knee replacement EXAM: PORTABLE LEFT KNEE - 1-2 VIEW COMPARISON:  None. FINDINGS: Portable views of the left knee show the femoral and tibial components of the left total knee replacement to be in good position and alignment. No complicating features are seen. A small amount air is noted within soft tissues postoperatively and surgical drains are present. IMPRESSION: Left total knee replacement components in good position and alignment. No complicating features. Electronically Signed   By: Ivar Drape M.D.   On: 11/28/2017 11:47    Assessment:   Caroline [name] is a 76 y.o. female admitted with SOB, resp failure, hyponatremia and elevated wbc a few days after TKR surgery. She had Ct done to r/o Pe and it showed multifocal fluid vs infiltrate as well as bile pleural effusions. BNP was elevated, Procacl nml.  UA negative  I do not think she has PNA and likely all related to volume overload (BNP up, imaging with effusions and edema, Na low and procalcitonin normal). No evidence of joint infection and incision site wnl. Reviewing old records she has a chronic elevation of wbc up to 16-18 range. Currently wbc is up but received solumedrol yesterday.  Recommendations I have ordered repeat Procalcitonin and if nml I  would suggest stop abx and monitor temperature curve and wbc. Wbc should trend down in 2 days or so but would not expect it to get below 16-18 which is her baseline..  Thank you very much for allowing me to participate in the care of this patient. Please call with questions.   Cheral Marker. Ola Spurr, MD

## 2017-12-06 NOTE — Progress Notes (Signed)
Will sign off at this time Please call with any questions

## 2017-12-06 NOTE — Progress Notes (Signed)
Subjective:  Patient is out of the ICU. Her husband and friends at the bedside. Patient reports left knee pain as mild.  Patient is out of bed to a chair.  Objective:   VITALS:   Vitals:   12/06/17 0452 12/06/17 0821 12/06/17 0826 12/06/17 1158  BP: 137/74 (!) 174/73 (!) 147/75   Pulse: 80 79  78  Resp: 19 18    Temp: 97.6 F (36.4 C) 97.8 F (36.6 C)    TempSrc: Axillary Oral    SpO2: 97% 95%  95%  Weight:      Height:        PHYSICAL EXAM: Left lower extremity:  I personally changed patient's dressing last night. There is no fluctuance or drainage from her incision. Her dressing today remains clean and dry. Neurovascular intact Sensation intact distally Intact pulses distally Dorsiflexion/Plantar flexion intact Incision: dressing C/D/I No cellulitis present Compartment soft  LABS  Results for orders placed or performed during the hospital encounter of 12/05/17 (from the past 24 hour(s))  Troponin I     Status: None   Collection Time: 12/05/17  1:14 PM  Result Value Ref Range   Troponin I <0.03 <0.03 ng/mL  Basic metabolic panel     Status: Abnormal   Collection Time: 12/05/17  2:30 PM  Result Value Ref Range   Sodium 121 (L) 135 - 145 mmol/L   Potassium 5.7 (H) 3.5 - 5.1 mmol/L   Chloride 90 (L) 101 - 111 mmol/L   CO2 19 (L) 22 - 32 mmol/L   Glucose, Bld 168 (H) 65 - 99 mg/dL   BUN 33 (H) 6 - 20 mg/dL   Creatinine, Ser 1.54 (H) 0.44 - 1.00 mg/dL   Calcium 9.3 8.9 - 10.3 mg/dL   GFR calc non Af Amer 31 (L) >60 mL/min   GFR calc Af Amer 36 (L) >60 mL/min   Anion gap 12 5 - 15  Troponin I     Status: None   Collection Time: 12/05/17  7:00 PM  Result Value Ref Range   Troponin I <0.03 <0.03 ng/mL  Basic metabolic panel     Status: Abnormal   Collection Time: 12/05/17  9:39 PM  Result Value Ref Range   Sodium 125 (L) 135 - 145 mmol/L   Potassium 4.5 3.5 - 5.1 mmol/L   Chloride 89 (L) 101 - 111 mmol/L   CO2 21 (L) 22 - 32 mmol/L   Glucose, Bld 137 (H) 65 -  99 mg/dL   BUN 34 (H) 6 - 20 mg/dL   Creatinine, Ser 1.57 (H) 0.44 - 1.00 mg/dL   Calcium 9.2 8.9 - 10.3 mg/dL   GFR calc non Af Amer 31 (L) >60 mL/min   GFR calc Af Amer 36 (L) >60 mL/min   Anion gap 15 5 - 15  Basic metabolic panel     Status: Abnormal   Collection Time: 12/06/17  5:52 AM  Result Value Ref Range   Sodium 127 (L) 135 - 145 mmol/L   Potassium 3.7 3.5 - 5.1 mmol/L   Chloride 93 (L) 101 - 111 mmol/L   CO2 22 22 - 32 mmol/L   Glucose, Bld 123 (H) 65 - 99 mg/dL   BUN 34 (H) 6 - 20 mg/dL   Creatinine, Ser 1.63 (H) 0.44 - 1.00 mg/dL   Calcium 9.0 8.9 - 10.3 mg/dL   GFR calc non Af Amer 29 (L) >60 mL/min   GFR calc Af Amer 34 (L) >60 mL/min  Anion gap 12 5 - 15  CBC     Status: Abnormal   Collection Time: 12/06/17  5:52 AM  Result Value Ref Range   WBC 29.8 (H) 3.6 - 11.0 K/uL   RBC 3.13 (L) 3.80 - 5.20 MIL/uL   Hemoglobin 10.2 (L) 12.0 - 16.0 g/dL   HCT 30.6 (L) 35.0 - 47.0 %   MCV 97.5 80.0 - 100.0 fL   MCH 32.6 26.0 - 34.0 pg   MCHC 33.4 32.0 - 36.0 g/dL   RDW 14.8 (H) 11.5 - 14.5 %   Platelets 464 (H) 150 - 440 K/uL    Ct Angio Chest Pe W/cm &/or Wo Cm  Result Date: 12/05/2017 CLINICAL DATA:  Acute onset of shortness of breath. Recent knee replacement. Generalized chest pain and left toe discoloration. EXAM: CT ANGIOGRAPHY CHEST WITH CONTRAST TECHNIQUE: Multidetector CT imaging of the chest was performed using the standard protocol during bolus administration of intravenous contrast. Multiplanar CT image reconstructions and MIPs were obtained to evaluate the vascular anatomy. CONTRAST:  101mL ISOVUE-370 IOPAMIDOL (ISOVUE-370) INJECTION 76% COMPARISON:  Chest radiograph performed earlier today at 1:38 a.m. FINDINGS: Cardiovascular: There is no evidence of pulmonary embolus. Evaluation for pulmonary embolus is suboptimal in areas of airspace consolidation. The heart is borderline normal in size. Calcification is noted at the aortic and mitral valves. Scattered  coronary artery calcifications are seen. Mediastinum/Nodes: A mildly enlarged 1.5 cm subcarinal node is noted. No additional mediastinal lymphadenopathy is seen. No pericardial effusion is identified. The visualized portions of the thyroid gland are unremarkable. No axillary lymphadenopathy is appreciated. Lungs/Pleura: Small bilateral pleural effusions are noted. Patchy bilateral airspace opacities are noted, most prominent at the right upper lobe and left lower lobe. This is concerning for pneumonia superimposed on pulmonary edema. No pneumothorax is identified. No dominant mass is seen. Upper Abdomen: The visualized portions of the liver and spleen are unremarkable. Stones are noted dependently within the gallbladder. The visualized portions of the pancreas and adrenal glands are within normal limits. Minimal contrast is noted within the left renal calyces. Scattered calcification is seen along the proximal abdominal aorta, and relatively diffuse calcification is seen along the superior mesenteric artery and the proximal renal arteries bilaterally. Musculoskeletal: No acute osseous abnormalities are identified. The visualized musculature is unremarkable in appearance. Review of the MIP images confirms the above findings. IMPRESSION: 1. No evidence of pulmonary embolus. 2. Small bilateral pleural effusions. Patchy bilateral airspace opacities, most prominent at the right upper lobe and left lower lobe. This is concerning for pneumonia superimposed on pulmonary edema. 3. Mildly enlarged 1.5 cm subcarinal node may reflect underlying infection. 4. Scattered coronary artery calcifications. Calcification at the aortic and mitral valves. 5. Cholelithiasis. 6. Relatively diffuse calcification along the superior mesenteric artery and the proximal renal arteries bilaterally. Would correlate for evidence of refractory hypertension. Aortic Atherosclerosis (ICD10-I70.0). Electronically Signed   By: Garald Balding M.D.   On:  12/05/2017 04:04   Dg Chest Port 1 View  Result Date: 12/05/2017 CLINICAL DATA:  Dyspnea.  Recent knee arthroplasty surgery. EXAM: PORTABLE CHEST 1 VIEW COMPARISON:  11/29/2017 FINDINGS: Central airspace opacities may represent pulmonary edema. This is worsened. Mild vascular and interstitial prominence. Unchanged cardiomegaly. IMPRESSION: Probable congestive heart failure. Infectious infiltrates are less likely. Electronically Signed   By: Andreas Newport M.D.   On: 12/05/2017 02:03    Assessment/Plan:     Active Problems:   Pneumonia  Continue with physical therapy for her left knee. Patient is  weightbearing as tolerated on left lower extremity. I will defer to medicine in regards to management of her pneumonia and hyponatremia and elevated creatinine.  Continue IV antibiotics as prescribed.   Patient will follow up with me in the office within 1 week following discharge from the hospital for wound check and staple removal.    Thornton Park , MD 12/06/2017, 1:00 PM

## 2017-12-06 NOTE — Progress Notes (Signed)
Physical Therapy Treatment Patient Details Name: Caroline [name] MRN: [id number] DOB: November 30, 1941 Today's Date: 12/06/2017    History of Present Illness 76 y/o female who underwent L TKA 12/4, went home and returns with pneumonia/weakness.     PT Comments    Pt agreeable to PT; denies left knee pain. Pt requires set up and supervision for all bed mobility, STS and toileting tasks. Ambulation slow, but steady with rolling walker. Educated pt/spouse about increasing out of bed and walking activity time to aid in recovery from pneumonia, as well as continue rehab for left TKR. Pt encouraged to perform ankle pumps, as well as QS with knee ext stretch and knee flexion stretching. Pt/spouse have questions regarding use of ice and preventing blood clots. It was noted that pt did not have TED hose. Questioned nurse who then spoke with orthopedic physician present on floor. Physician states pt did not need TED hose order. Spoke with pt/spouse afterward to inform them of doctors position. Pt wished back to bed, but encouraged up in the chair for a few hours. Continue PT to progress strength, endurance to progress functional mobility and allow for a safe return home.   Follow Up Recommendations  Home health PT     Equipment Recommendations  Rolling walker with 5" wheels;3in1 (PT)    Recommendations for Other Services       Precautions / Restrictions Precautions Precautions: Fall;Knee Restrictions Weight Bearing Restrictions: Yes LLE Weight Bearing: Weight bearing as tolerated    Mobility  Bed Mobility Overal bed mobility: Modified Independent Bed Mobility: Supine to Sit     Supine to sit: Modified independent (Device/Increase time)     General bed mobility comments: Increased speed/ability today   Transfers Overall transfer level: Needs assistance Equipment used: Rolling walker (2 wheeled) Transfers: Sit to/from Stand Sit to Stand: Supervision         General transfer comment:  Good stability; able to stand and perform personal hygiene without assist or holding onto rw  Ambulation/Gait Ambulation/Gait assistance: Supervision Ambulation Distance (Feet): 20 Feet Assistive device: Rolling walker (2 wheeled) Gait Pattern/deviations: Step-through pattern;Decreased stride length     General Gait Details: No L knee buckling, slow but steady cadence   Stairs            Wheelchair Mobility    Modified Rankin (Stroke Patients Only)       Balance Overall balance assessment: Needs assistance         Standing balance support: Bilateral upper extremity supported Standing balance-Leahy Scale: Fair                              Cognition Arousal/Alertness: Awake/alert Behavior During Therapy: WFL for tasks assessed/performed Overall Cognitive Status: Within Functional Limits for tasks assessed                                        Exercises Total Joint Exercises Ankle Circles/Pumps: AROM;Both;20 reps Quad Sets: Strengthening;Both;20 reps Other Exercises Other Exercises: set up and supervision assist for toileting    General Comments        Pertinent Vitals/Pain Pain Assessment: No/denies pain    Home Living                      Prior Function  PT Goals (current goals can now be found in the care plan section) Progress towards PT goals: Progressing toward goals    Frequency    7X/week      PT Plan Current plan remains appropriate    Co-evaluation              AM-PAC PT "6 Clicks" Daily Activity  Outcome Measure  Difficulty turning over in bed (including adjusting bedclothes, sheets and blankets)?: None Difficulty moving from lying on back to sitting on the side of the bed? : A Little Difficulty sitting down on and standing up from a chair with arms (e.g., wheelchair, bedside commode, etc,.)?: A Little Help needed moving to and from a bed to chair (including a  wheelchair)?: A Little Help needed walking in hospital room?: A Little Help needed climbing 3-5 steps with a railing? : A Lot 6 Click Score: 18    End of Session         PT Visit Diagnosis: Muscle weakness (generalized) (M62.81);Difficulty in walking, not elsewhere classified (R26.2);Pain Pain - Right/Left: Left Pain - part of body: Knee     Time: 8979-1504 PT Time Calculation (min) (ACUTE ONLY): 30 min  Charges:  $Gait Training: 8-22 mins $Therapeutic Exercise: 8-22 mins                    G Codes:        Larae Grooms, PTA 12/06/2017, 12:53 PM

## 2017-12-06 NOTE — Care Management Note (Signed)
Case Management Note  Patient Details  Name: Caroline [name] MRN: [id number] Date of Birth: Dec 23, 1941  Subjective/Objective:  Met with patient and her spouse at bedside. Offered choice of home health agencies for [place]. Referral to [facility]. Patient will need no DME.                   Action/Plan: Continuing to follow progression.   Expected Discharge Date:  12/09/17               Expected Discharge Plan:  [place]  In-House Referral:     Discharge planning Services  CM Consult  Post Acute Care Choice:  Home Health Choice offered to:  Spouse, Patient  DME Arranged:    DME Agency:     HH Arranged:  PT [place]:  [place]  Status of Service:  In process, will continue to follow  If discussed at Long Length of Stay Meetings, dates discussed:    Additional Comments:  Jolly Mango, RN 12/06/2017, 3:04 PM

## 2017-12-06 NOTE — Progress Notes (Signed)
[facility] [place] NAME: Caroline [name]    MR#:  [id number]  DATE OF BIRTH:  [birth year]  SUBJECTIVE:  CHIEF COMPLAINT:   Chief Complaint  Patient presents with  . Respiratory Distress   - Feels much better. Right knee pain is improving -Hyponatremia is improving. But WBC elevated. Patient denies any diarrhea  REVIEW OF SYSTEMS:  Review of Systems  Constitutional: Positive for malaise/fatigue. Negative for chills and fever.  HENT: Negative for congestion, ear discharge, hearing loss and nosebleeds.   Eyes: Negative for blurred vision and double vision.  Respiratory: Positive for shortness of breath. Negative for cough and wheezing.   Cardiovascular: Negative for chest pain, palpitations and leg swelling.  Gastrointestinal: Negative for abdominal pain, constipation, diarrhea, nausea and vomiting.  Genitourinary: Negative for dysuria.  Musculoskeletal: Positive for joint pain and myalgias.  Neurological: Negative for dizziness, focal weakness, seizures and headaches.  Psychiatric/Behavioral: Negative for depression.    DRUG ALLERGIES:  No Known Allergies  VITALS:  Blood pressure (!) 147/75, pulse 78, temperature 97.8 F (36.6 C), temperature source Oral, resp. rate 18, height 5\' 3"  (1.6 m), weight 79.4 kg (175 lb 0.7 oz), SpO2 95 %.  PHYSICAL EXAMINATION:  Physical Exam  GENERAL:  76 y.o.-year-old patient lying in the bed with no acute distress.  EYES: Pupils equal, round, reactive to light and accommodation. No scleral icterus. Extraocular muscles intact.  HEENT: Head atraumatic, normocephalic. Oropharynx and nasopharynx clear.  NECK:  Supple, no jugular venous distention. No thyroid enlargement, no tenderness. Rhonchi at the bases LUNGS: Normal breath sounds bilaterally, no wheezing, rales,rhonchi or crepitation. No use of accessory muscles of respiration.  CARDIOVASCULAR: S1, S2 normal. No murmurs, rubs, or gallops.  ABDOMEN:  Soft, nontender, nondistended. Bowel sounds present. No organomegaly or mass.  EXTREMITIES: No pedal edema, cyanosis, or clubbing. Left knee swelling noted, staples in place without any infection noted. NEUROLOGIC: Cranial nerves II through XII are intact. Muscle strength 5/5 in all extremities. Sensation intact. Gait not checked.  PSYCHIATRIC: The patient is alert and oriented x 3. Slow in conversation SKIN: No obvious rash, lesion, or ulcer.    LABORATORY PANEL:   CBC Recent Labs  Lab 12/06/17 0552  WBC 29.8*  HGB 10.2*  HCT 30.6*  PLT 464*   ------------------------------------------------------------------------------------------------------------------  Chemistries  Recent Labs  Lab 12/05/17 0141  12/06/17 0552  NA 120*   < > 127*  K 6.1*   < > 3.7  CL 87*   < > 93*  CO2 19*   < > 22  GLUCOSE 139*   < > 123*  BUN 36*   < > 34*  CREATININE 1.60*   < > 1.63*  CALCIUM 9.6   < > 9.0  AST 32  --   --   ALT 30  --   --   ALKPHOS 93  --   --   BILITOT 0.8  --   --    < > = values in this interval not displayed.   ------------------------------------------------------------------------------------------------------------------  Cardiac Enzymes Recent Labs  Lab 12/05/17 1900  TROPONINI <0.03   ------------------------------------------------------------------------------------------------------------------  RADIOLOGY:  Ct Angio Chest Pe W/cm &/or Wo Cm  Result Date: 12/05/2017 CLINICAL DATA:  Acute onset of shortness of breath. Recent knee replacement. Generalized chest pain and left toe discoloration. EXAM: CT ANGIOGRAPHY CHEST WITH CONTRAST TECHNIQUE: Multidetector CT imaging of the chest was performed using the standard protocol during bolus administration of intravenous contrast. Multiplanar  CT image reconstructions and MIPs were obtained to evaluate the vascular anatomy. CONTRAST:  8mL ISOVUE-370 IOPAMIDOL (ISOVUE-370) INJECTION 76% COMPARISON:  Chest radiograph  performed earlier today at 1:38 a.m. FINDINGS: Cardiovascular: There is no evidence of pulmonary embolus. Evaluation for pulmonary embolus is suboptimal in areas of airspace consolidation. The heart is borderline normal in size. Calcification is noted at the aortic and mitral valves. Scattered coronary artery calcifications are seen. Mediastinum/Nodes: A mildly enlarged 1.5 cm subcarinal node is noted. No additional mediastinal lymphadenopathy is seen. No pericardial effusion is identified. The visualized portions of the thyroid gland are unremarkable. No axillary lymphadenopathy is appreciated. Lungs/Pleura: Small bilateral pleural effusions are noted. Patchy bilateral airspace opacities are noted, most prominent at the right upper lobe and left lower lobe. This is concerning for pneumonia superimposed on pulmonary edema. No pneumothorax is identified. No dominant mass is seen. Upper Abdomen: The visualized portions of the liver and spleen are unremarkable. Stones are noted dependently within the gallbladder. The visualized portions of the pancreas and adrenal glands are within normal limits. Minimal contrast is noted within the left renal calyces. Scattered calcification is seen along the proximal abdominal aorta, and relatively diffuse calcification is seen along the superior mesenteric artery and the proximal renal arteries bilaterally. Musculoskeletal: No acute osseous abnormalities are identified. The visualized musculature is unremarkable in appearance. Review of the MIP images confirms the above findings. IMPRESSION: 1. No evidence of pulmonary embolus. 2. Small bilateral pleural effusions. Patchy bilateral airspace opacities, most prominent at the right upper lobe and left lower lobe. This is concerning for pneumonia superimposed on pulmonary edema. 3. Mildly enlarged 1.5 cm subcarinal node may reflect underlying infection. 4. Scattered coronary artery calcifications. Calcification at the aortic and mitral  valves. 5. Cholelithiasis. 6. Relatively diffuse calcification along the superior mesenteric artery and the proximal renal arteries bilaterally. Would correlate for evidence of refractory hypertension. Aortic Atherosclerosis (ICD10-I70.0). Electronically Signed   By: Garald Balding M.D.   On: 12/05/2017 04:04   Dg Chest Port 1 View  Result Date: 12/05/2017 CLINICAL DATA:  Dyspnea.  Recent knee arthroplasty surgery. EXAM: PORTABLE CHEST 1 VIEW COMPARISON:  11/29/2017 FINDINGS: Central airspace opacities may represent pulmonary edema. This is worsened. Mild vascular and interstitial prominence. Unchanged cardiomegaly. IMPRESSION: Probable congestive heart failure. Infectious infiltrates are less likely. Electronically Signed   By: Andreas Newport M.D.   On: 12/05/2017 02:03    EKG:   Orders placed or performed in visit on 12/05/17  . EKG 12-Lead    ASSESSMENT AND PLAN:   76 year old female with past medical history significant for CK D stage III, hypertension, diastolic CHF, paroxysmal atrial fibrillation, history of TIA and arthritis who recently had a left total knee replacement surgery done last week presents with acute hypoxic respiratory failure.  #1 acute hypoxic respiratory failure-secondary to right basilar pneumonia. Recently hospitalized for left total knee replacement surgery. -MRSA PCR was negative, not on  vancomycin at this time. Continue cefepime. -Blood cultures are negative -Off BiPAP and on  2 L oxygen via nasal cannula. -Appreciate intensivist consult - continue current management and wean off o2  2. Hyponatremia and hypochloremia-  Did not improve with IV fluids. Likely fluid overload. -Restarted Lasix. Improving sodium -Nephrology follow-up today -Known history of chronic diastolic CHF.  3. Hyperkalemia-patient was taking lisinopril and potassium supplements at home due to hypokalemia. -After Kayexalate, her hyperkalemia is resolved. Lisinopril and potassium  supplements on hold.   4. CK D stage III-creatinine seems to be  at baseline, however sodium is worsening.  -Nephrology consult pending  5. Left total knee replacement surgery-stable. Orthopedics consulted. -Continue brace and pain medications as needed -Weight bearing as tolerated and will need home health at discharge  6. Leukocytosis-initially thought to be secondary to pneumonia. Seems to be much worsening. Denies any diarrhea. No other source of infection identified. -Currently on cefepime -Repeat UA ordered. Follow up ID recommendations  7. Paroxysmal atrial fibrillation-heart rate well controlled. On metoprolol. On eliquis for anticoagulation  Working well with physical therapy. Will need home health at discharge   All the records are reviewed and case discussed with Care Management/Social Workerr. Management plans discussed with the patient, family and they are in agreement.  CODE STATUS: Full Code  TOTAL TIME TAKING CARE OF THIS PATIENT: 36 minutes.   POSSIBLE D/C IN 2 DAYS, DEPENDING ON CLINICAL CONDITION.   Gladstone Lighter M.D on 12/06/2017 at 3:34 PM  Between 7am to 6pm - Pager - [phone]  After 6pm go to [email or web address] - password [email or web address] [facility]  Office  [phone]  CC: Primary care physician; Remi Haggard, FNP

## 2017-12-07 ENCOUNTER — Encounter: Payer: Self-pay | Admitting: Nurse Practitioner

## 2017-12-07 DIAGNOSIS — R0603 Acute respiratory distress: Secondary | ICD-10-CM

## 2017-12-07 DIAGNOSIS — J189 Pneumonia, unspecified organism: Principal | ICD-10-CM

## 2017-12-07 DIAGNOSIS — R0902 Hypoxemia: Secondary | ICD-10-CM

## 2017-12-07 DIAGNOSIS — E871 Hypo-osmolality and hyponatremia: Secondary | ICD-10-CM

## 2017-12-07 DIAGNOSIS — I480 Paroxysmal atrial fibrillation: Secondary | ICD-10-CM

## 2017-12-07 LAB — CBC
HEMATOCRIT: 33.2 % — AB (ref 35.0–47.0)
Hemoglobin: 11 g/dL — ABNORMAL LOW (ref 12.0–16.0)
MCH: 32.2 pg (ref 26.0–34.0)
MCHC: 33.2 g/dL (ref 32.0–36.0)
MCV: 97 fL (ref 80.0–100.0)
Platelets: 529 10*3/uL — ABNORMAL HIGH (ref 150–440)
RBC: 3.42 MIL/uL — ABNORMAL LOW (ref 3.80–5.20)
RDW: 15.1 % — AB (ref 11.5–14.5)
WBC: 28 10*3/uL — AB (ref 3.6–11.0)

## 2017-12-07 LAB — BASIC METABOLIC PANEL
Anion gap: 13 (ref 5–15)
BUN: 26 mg/dL — ABNORMAL HIGH (ref 6–20)
CALCIUM: 8.8 mg/dL — AB (ref 8.9–10.3)
CO2: 24 mmol/L (ref 22–32)
CREATININE: 1.27 mg/dL — AB (ref 0.44–1.00)
Chloride: 96 mmol/L — ABNORMAL LOW (ref 101–111)
GFR calc non Af Amer: 40 mL/min — ABNORMAL LOW (ref >60)
GFR, EST AFRICAN AMERICAN: 46 mL/min — AB (ref >60)
GLUCOSE: 112 mg/dL — AB (ref 65–99)
Potassium: 3 mmol/L — ABNORMAL LOW (ref 3.5–5.1)
Sodium: 133 mmol/L — ABNORMAL LOW (ref 135–145)

## 2017-12-07 MED ORDER — FUROSEMIDE 20 MG PO TABS
20.0000 mg | ORAL_TABLET | Freq: Every day | ORAL | Status: DC
  Administered 2017-12-08: 20 mg via ORAL
  Filled 2017-12-07: qty 1

## 2017-12-07 MED ORDER — METOPROLOL TARTRATE 50 MG PO TABS
50.0000 mg | ORAL_TABLET | Freq: Two times a day (BID) | ORAL | Status: DC
  Administered 2017-12-07 – 2017-12-08 (×3): 50 mg via ORAL
  Filled 2017-12-07 (×3): qty 1

## 2017-12-07 MED ORDER — DILTIAZEM HCL ER COATED BEADS 120 MG PO CP24
120.0000 mg | ORAL_CAPSULE | Freq: Every day | ORAL | Status: DC
  Administered 2017-12-07 – 2017-12-08 (×2): 120 mg via ORAL
  Filled 2017-12-07 (×2): qty 1

## 2017-12-07 MED ORDER — METOPROLOL TARTRATE 5 MG/5ML IV SOLN
5.0000 mg | INTRAVENOUS | Status: AC | PRN
  Administered 2017-12-07 (×3): 5 mg via INTRAVENOUS
  Filled 2017-12-07 (×3): qty 5

## 2017-12-07 MED ORDER — FUROSEMIDE 10 MG/ML IJ SOLN
40.0000 mg | Freq: Once | INTRAMUSCULAR | Status: AC
  Administered 2017-12-07: 40 mg via INTRAVENOUS
  Filled 2017-12-07: qty 4

## 2017-12-07 MED ORDER — POTASSIUM CHLORIDE 20 MEQ PO PACK
40.0000 meq | PACK | Freq: Once | ORAL | Status: AC
  Administered 2017-12-07: 40 meq via ORAL
  Filled 2017-12-07: qty 2

## 2017-12-07 NOTE — Progress Notes (Signed)
[facility] [place] NAME: Caroline [name]    MR#:  [id number]  DATE OF BIRTH:  March 27, 1941  SUBJECTIVE:  - Feels much better. Right knee pain is improving -Hyponatremia is improving. But WBC elevated. Patient denies any diarrhea No fever -on ra 94%  REVIEW OF SYSTEMS:  Review of Systems  Constitutional: Positive for malaise/fatigue. Negative for chills and fever.  HENT: Negative for congestion, ear discharge, hearing loss and nosebleeds.   Eyes: Negative for blurred vision and double vision.  Respiratory: Positive for shortness of breath. Negative for cough and wheezing.   Cardiovascular: Negative for chest pain, palpitations and leg swelling.  Gastrointestinal: Negative for abdominal pain, constipation, diarrhea, nausea and vomiting.  Genitourinary: Negative for dysuria.  Musculoskeletal: Positive for joint pain and myalgias.  Neurological: Negative for dizziness, focal weakness, seizures and headaches.  Psychiatric/Behavioral: Negative for depression.    DRUG ALLERGIES:  No Known Allergies  VITALS:  Blood pressure 140/80, pulse 94, temperature 98.8 F (37.1 C), temperature source Oral, resp. rate 17, height 5\' 3"  (1.6 m), weight 79.4 kg (175 lb 0.7 oz), SpO2 94 %.  PHYSICAL EXAMINATION:  Physical Exam  GENERAL:  76 y.o.-year-old patient lying in the bed with no acute distress.  EYES: Pupils equal, round, reactive to light and accommodation. No scleral icterus. Extraocular muscles intact.  HEENT: Head atraumatic, normocephalic. Oropharynx and nasopharynx clear.  NECK:  Supple, no jugular venous distention. No thyroid enlargement, no tenderness. Rhonchi at the bases LUNGS: Normal breath sounds bilaterally, no wheezing, rales,rhonchi or crepitation. No use of accessory muscles of respiration.  CARDIOVASCULAR: S1, S2 normal. No murmurs, rubs, or gallops.  ABDOMEN: Soft, nontender, nondistended. Bowel sounds present. No organomegaly  or mass.  EXTREMITIES: No pedal edema, cyanosis, or clubbing. Left knee swelling noted, staples in place without any infection noted. NEUROLOGIC: Cranial nerves II through XII are intact. Muscle strength 5/5 in all extremities. Sensation intact. Gait not checked.  PSYCHIATRIC: The patient is alert and oriented x 3. Slow in conversation SKIN: No obvious rash, lesion, or ulcer.   LABORATORY PANEL:   CBC Recent Labs  Lab 12/07/17 0447  WBC 28.0*  HGB 11.0*  HCT 33.2*  PLT 529*   ------------------------------------------------------------------------------------------------------------------  Chemistries  Recent Labs  Lab 12/05/17 0141  12/07/17 0447  NA 120*   < > 133*  K 6.1*   < > 3.0*  CL 87*   < > 96*  CO2 19*   < > 24  GLUCOSE 139*   < > 112*  BUN 36*   < > 26*  CREATININE 1.60*   < > 1.27*  CALCIUM 9.6   < > 8.8*  AST 32  --   --   ALT 30  --   --   ALKPHOS 93  --   --   BILITOT 0.8  --   --    < > = values in this interval not displayed.   ------------------------------------------------------------------------------------------------------------------  Cardiac Enzymes Recent Labs  Lab 12/05/17 1900  TROPONINI <0.03   ------------------------------------------------------------------------------------------------------------------  RADIOLOGY:  No results found.  EKG:   Orders placed or performed in visit on 12/05/17  . EKG 12-Lead    ASSESSMENT AND PLAN:   76 year old female with past medical history significant for CK D stage III, hypertension, diastolic CHF, paroxysmal atrial fibrillation, history of TIA and arthritis who recently had a left total knee replacement surgery done last week presents with acute hypoxic respiratory failure.  #1  acute hypoxic respiratory failure-secondary to acute on chronic diastolic CHF - Recently hospitalized for left total knee replacement surgery. And got IvF also -MRSA PCR was negative, not on  vancomycin at this  time. D/c cefepime since procalcitonin x2 negative -bnp x3 elevated -Blood cultures are negative -Off BiPAP and on  2 L oxygen via nasal cannula. -Appreciate Id consult---agrre with plan to d/c abxs - continue current management and wean off o2  2. Hyponatremia and hypochloremia due to chf -d/ced  IV fluids. Likely fluid overload. -Restarted Lasix. Improving sodium now 118---133 -Known history of chronic diastolic CHF.  3. Hyperkalemia-patient was taking lisinopril and potassium supplements at home due to hypokalemia. -After Kayexalate, her hyperkalemia is resolved. Lisinopril and potassium supplements on hold.   4. CK D stage III-creatinine seems to be at baseline, however sodium is improved  5. Left total knee replacement surgery-stable. Orthopedics consulted. -Continue brace and pain medications as needed -Weight bearing as tolerated and will need home health at discharge  6. Chronic Leukocytosis  Denies any diarrhea. No other source of infection identified. Baseline count 15-16K  7. Paroxysmal atrial fibrillation-heart rate well controlled.  -On metoprolol and cardizem -On eliquis for anticoagulation  Working well with physical therapy. Will need home health at discharge.   All the records are reviewed and case discussed with Care Management/Social Workerr. Management plans discussed with the patient, family and they are in agreement.  CODE STATUS: Full Code  TOTAL TIME TAKING CARE OF THIS PATIENT: 26 minutes.   Pt wants to stay one more day   Fritzi Mandes M.D on 12/07/2017 at 7:58 AM  Between 7am to 6pm - Pager - [phone]  After 6pm go to [email or web address] - password [email or web address] [facility]  Office  [phone]  CC: Primary care physician; Remi Haggard, FNP

## 2017-12-07 NOTE — Progress Notes (Signed)
Paged Prime doc [phone] HR fluctuating between 114-140

## 2017-12-07 NOTE — Progress Notes (Signed)
Physical Therapy Treatment Patient Details Name: Caroline [name] MRN: [id number] DOB: 1941-10-14 Today's Date: 12/07/2017    History of Present Illness 76 y/o female who underwent L TKA 12/4, went home and returns with pneumonia/weakness.     PT Comments    Pt in bed requesting to use commode.  Reports fatigue today "I don't know if I can do this".  Transferred to bedside commode to void and returned to bed.  Min assist for LE management.  HR monitored and 104 at rest, increased to 148 with mobility skills.  Primary nurse notified.  Will re-check pt later this pm for mobility skills as appropriate.   Follow Up Recommendations  Home health PT     Equipment Recommendations  Rolling walker with 5" wheels;3in1 (PT)    Recommendations for Other Services       Precautions / Restrictions Precautions Precautions: Fall;Knee Precaution Booklet Issued: No Precaution Comments: watch HR Restrictions Weight Bearing Restrictions: Yes LLE Weight Bearing: Weight bearing as tolerated    Mobility  Bed Mobility Overal bed mobility: Needs Assistance Bed Mobility: Supine to Sit;Sit to Supine     Supine to sit: Min assist     General bed mobility comments: LE management  Transfers Overall transfer level: Needs assistance Equipment used: Rolling walker (2 wheeled) Transfers: Sit to/from Stand Sit to Stand: Supervision         General transfer comment: Good stability; able to stand and perform personal hygiene without assist or holding onto rw  Ambulation/Gait Ambulation/Gait assistance: Supervision Ambulation Distance (Feet): 3 Feet Assistive device: Rolling walker (2 wheeled) Gait Pattern/deviations: Step-to pattern Gait velocity: Decreased       Stairs            Wheelchair Mobility    Modified Rankin (Stroke Patients Only)       Balance Overall balance assessment: Needs assistance Sitting-balance support: No upper extremity supported Sitting balance-Leahy  Scale: Good     Standing balance support: Bilateral upper extremity supported Standing balance-Leahy Scale: Fair                              Cognition Arousal/Alertness: Awake/alert Behavior During Therapy: WFL for tasks assessed/performed Overall Cognitive Status: Within Functional Limits for tasks assessed                                        Exercises Other Exercises Other Exercises: to commode bedside    General Comments        Pertinent Vitals/Pain Pain Assessment: No/denies pain    Home Living                      Prior Function            PT Goals (current goals can now be found in the care plan section) Progress towards PT goals: Progressing toward goals    Frequency    7X/week      PT Plan Current plan remains appropriate    Co-evaluation              AM-PAC PT "6 Clicks" Daily Activity  Outcome Measure  Difficulty turning over in bed (including adjusting bedclothes, sheets and blankets)?: None Difficulty moving from lying on back to sitting on the side of the bed? : A Little Difficulty sitting down on and standing up  from a chair with arms (e.g., wheelchair, bedside commode, etc,.)?: A Little Help needed moving to and from a bed to chair (including a wheelchair)?: A Little Help needed walking in hospital room?: A Little Help needed climbing 3-5 steps with a railing? : A Lot 6 Click Score: 18    End of Session Equipment Utilized During Treatment: Gait belt Activity Tolerance: Treatment limited secondary to medical complications (Comment) Patient left: in bed;with bed alarm set;with call bell/phone within reach;with family/visitor present Nurse Communication: Other (comment) Pain - Right/Left: Left Pain - part of body: Knee     Time: 1100-1113 PT Time Calculation (min) (ACUTE ONLY): 13 min  Charges:  $Therapeutic Activity: 8-22 mins                    G Codes:       Chesley Noon,  PTA 12/07/17, 11:20 AM

## 2017-12-07 NOTE — Progress Notes (Signed)
[facility] INF[facility]IOUS [facility] PROGRESS NOTE Date of Admission:  12/05/2017     ID: Caroline [name] is a 76 y.o. female with PNA, leukocytosis. Active Problems:   Pneumonia   Subjective: No fevers, oob to chair. Off O2. Some nausea  ROS  Eleven systems are reviewed and negative except per hpi  Medications:  Antibiotics Given (last 72 hours)    Date/Time Action Medication Dose Rate   12/05/17 0441 New Bag/Given   cefTRIAXone (ROCEPHIN) 1 g in dextrose 5 % 50 mL IVPB - Premix 1 g 100 mL/hr   12/05/17 0601 New Bag/Given   azithromycin (ZITHROMAX) 500 mg in dextrose 5 % 250 mL IVPB 500 mg 250 mL/hr   12/05/17 0815 New Bag/Given   ceFEPIme (MAXIPIME) 2 g in dextrose 5 % 50 mL IVPB 2 g 100 mL/hr   12/06/17 0853 New Bag/Given  [medication not available]   ceFEPIme (MAXIPIME) 2 g in dextrose 5 % 50 mL IVPB 2 g 100 mL/hr     . allopurinol  100 mg Oral Daily  . apixaban  5 mg Oral BID  . diltiazem  120 mg Oral Daily  . [START ON 12/08/2017] furosemide  20 mg Oral Daily  . levothyroxine  112 mcg Oral QAC breakfast  . metoprolol tartrate  50 mg Oral BID  . simvastatin  20 mg Oral Daily  . sodium chloride flush  3 mL Intravenous Q12H    Objective: Vital signs in last 24 hours: Temp:  [97.5 F (36.4 C)-98.8 F (37.1 C)] 98.6 F (37 C) (12/13 0856) Pulse Rate:  [63-96] 96 (12/13 1056) Resp:  [17-18] 18 (12/13 0856) BP: (140-182)/(61-85) 150/70 (12/13 1056) SpO2:  [91 %-95 %] 93 % (12/13 0856) Constitutional:  oriented to person, place, and time. appears well-developed and well-nourished. No distress.  HENT: [place]/AT, PERRLA, no scleral icterus Mouth/Throat: Oropharynx is clear and moist. No oropharyngeal exudate.  Cardiovascular: Normal rate, regular rhythm and normal heart sounds. Exam reveals no gallop and no friction rub.  No murmur heard.  Pulmonary/Chest: poor air movement and dec bs bil bases Neck = supple, no nuchal rigidity Abdominal: Soft. Bowel sounds are normal.   exhibits no distension. There is no tenderness.  Lymphadenopathy: no cervical adenopathy. No axillary adenopathy Neurological: alert and oriented to person, place, and time.  Skin: l knee site covered with honeycomb dressing, appears intact and nontender Psychiatric: a normal mood and affect.  behavior is normal.   Lab Results Recent Labs    12/06/17 0552 12/07/17 0447  WBC 29.8* 28.0*  HGB 10.2* 11.0*  HCT 30.6* 33.2*  NA 127* 133*  K 3.7 3.0*  CL 93* 96*  CO2 22 24  BUN 34* 26*  CREATININE 1.63* 1.27*    Microbiology: Results for orders placed or performed during the hospital encounter of 12/05/17  Culture, blood (routine x 2)     Status: None (Preliminary result)   Collection Time: 12/05/17  3:45 AM  Result Value Ref Range Status   Specimen Description BLOOD LEFT ARM  Final   Special Requests BOTTLES DRAWN AEROBIC AND ANAEROBIC Tice  Final   Culture NO GROWTH 2 DAYS  Final   Report Status PENDING  Incomplete  Culture, blood (routine x 2)     Status: None (Preliminary result)   Collection Time: 12/05/17  4:34 AM  Result Value Ref Range Status   Specimen Description BLOOD LEFT ARM  Final   Special Requests BOTTLES DRAWN AEROBIC AND ANAEROBIC BCAV  Final  Culture NO GROWTH 2 DAYS  Final   Report Status PENDING  Incomplete  MRSA PCR Screening     Status: None   Collection Time: 12/05/17  6:19 AM  Result Value Ref Range Status   MRSA by PCR NEGATIVE NEGATIVE Final    Comment:        The GeneXpert MRSA Assay (FDA approved for NASAL specimens only), is one component of a comprehensive MRSA colonization surveillance program. It is not intended to diagnose MRSA infection nor to guide or monitor treatment for MRSA infections.     Studies/Results: No results found.  Assessment/Plan: Caroline [name] is a 76 y.o. female admitted with SOB, resp failure, hyponatremia and elevated wbc a few days after TKR surgery. She had Ct done to r/o Pe and it showed multifocal  fluid vs infiltrate as well as bil pleural effusions. BNP was elevated, Procacl nml.  UA negative  I do not think she has PNA and likely all related to volume overload (BNP up, imaging with effusions and edema, Na low and procalcitonin normal). No evidence of joint infection and incision site wnl. Reviewing old records she has a chronic elevation of wbc up to 16-18 range. Currently wbc is up but received solumedrol yesterday.  12/13- wbc down some. Procalc <0.10  Recommendations  I  would suggest stop abx and monitor temperature curve and wbc. Wbc should trend down in 2 days or so but would not expect it to get below 16-18 which is her baseline..   Thank you very much for the consult. Will follow with you.  Leonel Ramsay   12/07/2017, 2:17 PM

## 2017-12-07 NOTE — Progress Notes (Signed)
Physical Therapy Treatment Patient Details Name: Caroline [name] MRN: [id number] DOB: [birth year] Today's Date: 12/07/2017    History of Present Illness 76 y/o female who underwent L TKA 12/4, went home and returns with pneumonia/weakness.     PT Comments    Per chart review, pt with low potassium (3.0); MD notes reveal supplementation ordered, but not given at this point. Note per orthopedic addresses and advises to continue PT per pt tolerance. Pt agreeable to PT. Pt denies left knee pain and notes fatigue. Heart rate stable at 78 beats per minute seated and 84 post 25 ft ambulation; O2 saturation 94 and 91% respectively. Encouraged/educated pt in pursed lip breathing. Post improved breathing; O2 saturation 95%. Pt ambulation twice within room. Min guard/supervision required for toileting. Pt continues to require cues for safe hand placement with transfers. Pt receives up in chair. Re educated and encouraged in ankle pumps and quad sets. Continue PT to progress strength, endurance and progress all functional mobility.   Follow Up Recommendations  Home health PT     Equipment Recommendations  Rolling walker with 5" wheels;3in1 (PT)    Recommendations for Other Services       Precautions / Restrictions Precautions Precautions: Fall;Knee Precaution Booklet Issued: No Precaution Comments: watch HR Restrictions Weight Bearing Restrictions: Yes LLE Weight Bearing: Weight bearing as tolerated    Mobility  Bed Mobility Overal bed mobility: Modified Independent Bed Mobility: Supine to Sit;Sit to Supine     Supine to sit: Modified independent (Device/Increase time)     General bed mobility comments: Increased time/effort  Transfers Overall transfer level: Needs assistance Equipment used: Rolling walker (2 wheeled) Transfers: Sit to/from Stand Sit to Stand: Supervision         General transfer comment: cues for hand placement  Ambulation/Gait Ambulation/Gait assistance:  Min guard Ambulation Distance (Feet): 35 Feet(25 as well) Assistive device: Rolling walker (2 wheeled) Gait Pattern/deviations: Step-to pattern;Trunk flexed Gait velocity: Decreased Gait velocity interpretation: <1.8 ft/sec, indicative of risk for recurrent falls General Gait Details: slow, cautions step to pattern; encouraged QS to L knee to ensure stability. No buckling   Stairs            Wheelchair Mobility    Modified Rankin (Stroke Patients Only)       Balance Overall balance assessment: Needs assistance Sitting-balance support: No upper extremity supported Sitting balance-Leahy Scale: Good     Standing balance support: Bilateral upper extremity supported Standing balance-Leahy Scale: Fair                              Cognition Arousal/Alertness: Awake/alert Behavior During Therapy: WFL for tasks assessed/performed Overall Cognitive Status: Within Functional Limits for tasks assessed                                 General Comments: General fatigue      Exercises Total Joint Exercises Ankle Circles/Pumps: AROM;Both;20 reps Quad Sets: Strengthening;Both;20 reps Other Exercises Other Exercises: set up and stand by assist for toileting    General Comments        Pertinent Vitals/Pain Pain Assessment: No/denies pain    Home Living                      Prior Function            PT Goals (current goals can now  be found in the care plan section) Progress towards PT goals: Not progressing toward goals - comment    Frequency    7X/week      PT Plan Current plan remains appropriate    Co-evaluation              AM-PAC PT "6 Clicks" Daily Activity  Outcome Measure  Difficulty turning over in bed (including adjusting bedclothes, sheets and blankets)?: None Difficulty moving from lying on back to sitting on the side of the bed? : A Little Difficulty sitting down on and standing up from a chair with  arms (e.g., wheelchair, bedside commode, etc,.)?: A Little Help needed moving to and from a bed to chair (including a wheelchair)?: A Little Help needed walking in hospital room?: A Little Help needed climbing 3-5 steps with a railing? : A Lot 6 Click Score: 18    End of Session Equipment Utilized During Treatment: Gait belt Activity Tolerance: Patient limited by fatigue Patient left: in bed;with bed alarm set;with call bell/phone within reach;with family/visitor present Nurse Communication: Other (comment) PT Visit Diagnosis: Muscle weakness (generalized) (M62.81);Difficulty in walking, not elsewhere classified (R26.2);Pain Pain - Right/Left: Left Pain - part of body: Knee     Time: 1411-1445 PT Time Calculation (min) (ACUTE ONLY): 34 min  Charges:  $Gait Training: 8-22 mins $Therapeutic Activity: 8-22 mins                    G CodesLarae Grooms, PTA 12/07/2017, 2:58 PM

## 2017-12-07 NOTE — Plan of Care (Signed)
  Progressing Education: Knowledge of General Education information will improve 12/07/2017 0458 - Progressing by Brielle Moro, Lucille Passy, RN Health Behavior/Discharge Planning: Ability to manage health-related needs will improve 12/07/2017 0458 - Progressing by Millissa Deese, Lucille Passy, RN Clinical Measurements: Ability to maintain clinical measurements within normal limits will improve 12/07/2017 0458 - Progressing by Jamorian Dimaria, Lucille Passy, RN Will remain free from infection 12/07/2017 0458 - Progressing by Serenitie Vinton, Lucille Passy, RN Diagnostic test results will improve 12/07/2017 0458 - Progressing by Autry Droege, Lucille Passy, RN Respiratory complications will improve 12/07/2017 0458 - Progressing by Bryna Colander, RN Cardiovascular complication will be avoided 12/07/2017 0458 - Progressing by Bryna Colander, RN Activity: Risk for activity intolerance will decrease 12/07/2017 0458 - Progressing by Bryna Colander, RN Nutrition: Adequate nutrition will be maintained 12/07/2017 0458 - Progressing by Bryna Colander, RN Coping: Level of anxiety will decrease 12/07/2017 0458 - Progressing by Bryna Colander, RN Elimination: Will not experience complications related to bowel motility 12/07/2017 0458 - Progressing by Janyiah Silveri, Lucille Passy, RN Will not experience complications related to urinary retention 12/07/2017 0458 - Progressing by Diondra Pines, Lucille Passy, RN Pain Managment: General experience of comfort will improve 12/07/2017 0458 - Progressing by Jobeth Pangilinan, Lucille Passy, RN Safety: Ability to remain free from injury will improve 12/07/2017 0458 - Progressing by Melaysia Streed, Lucille Passy, RN Skin Integrity: Risk for impaired skin integrity will decrease 12/07/2017 0458 - Progressing by Danyiel Crespin, Lucille Passy, RN

## 2017-12-07 NOTE — Consult Note (Signed)
Cardiology Consult    Patient ID: Caroline [name] MRN: [id number], DOB/AGE: 09/17/1941   Admit date: 12/05/2017 Date of Consult: 12/07/2017  Primary Physician: Remi Haggard, FNP Primary Cardiologist: Ida Rogue, MD Requesting Provider: Chauncey Cruel. Posey Pronto, MD  Patient Profile    Caroline [name] is a 76 y.o. female with a history of HFpEF, HTN, PAF, CKD IV, HL, prior TIA/CVA, bilat carotid dzs, and OA s/p recent L TKA, who is being seen today for the evaluation of acute resp failure and volume overload at the request of Dr. Posey Pronto.  Past Medical History   Past Medical History:  Diagnosis Date  . Arthritis   . Chronic diastolic CHF (congestive heart failure) ([place])    a. 05/2017 Echo: 65-70%, mild AI, moderate MR, mildly dilated LA; b. 11/2017 Echo: EF 55-60%, no rwma, Gr2 DD, mod MS, mild MR, mod dil LA, PASP 24mmHg.  . CKD (chronic kidney disease), stage IV ([place])   . History of kidney stones 01/2017  . Hypertension   . Hypothyroidism   . Non-obstructive CAD (coronary artery disease)    a. 2005 Cath: LCX 20-58m, otw nl cors.  Marland Kitchen PAF (paroxysmal atrial fibrillation) ([place])    a. On Eliquis; b. CHADS2VASc => 8 (CHF, HTN, age x 2, stroke x 2, vascular disease, female)  . Stroke [facility])    TIA 9/17 & 04/06/2017    Past Surgical History:  Procedure Laterality Date  . ABDOMINAL HYSTERECTOMY    . CARDIAC CATHETERIZATION     Negative   . COLONOSCOPY  2010  . PARTIAL HYSTERECTOMY    . TOTAL KNEE ARTHROPLASTY Left 11/28/2017   Procedure: TOTAL KNEE ARTHROPLASTY;  Surgeon: Thornton Park, MD;  Location: [facility] ORS;  Service: Orthopedics;  Laterality: Left;     Allergies  No Known Allergies  History of Present Illness    76 y/o ? with the above complex PMH including nonobs CAD (remote cath 2005), HTN, HL, HFpEF, PAF on eliquis (dx by event monitor earlier this year), CKD IV, carotid dzs, prior TIA/CVA, and hypothyroidism.  She was recently admitted status post left total knee  arthroplasty on December 4 and was initially hypertensive.  She was receiving IV fluids and subsequently developed dyspnea with pulmonary edema.  We saw her during that admission and following gentle diuresis, she was transitioned back to oral Lasix.  We did rec Lasix 20 mg daily at discharge.  She was discharged December 7 by the orthopedic service and went home with her husband who thought he would be able to help her rehabilitate.  We note on the discharge summary, and both Lasix and Zestoretic were discontinued.  Pt thinks she might have continued them regardless.  Husband noted that she was very weak and had trouble standing or walking up steps at home, though she apparently was doing this with physical therapy here.  2 days after discharge, on Sunday, December 9, she began to experience progressive dyspnea.  This worsened throughout the evening prompting presentation back to the emergency department where she was found to be hypoxic with saturations in the 80s.  CT of the chest was negative for pulmonary embolism but there was concern for bilateral pneumonia with underlying edema.  She required BiPAP and was admitted to the ICU.  She was placed on antibiotics.  She was found to be hyponatremic and was placed on IV fluids.  Oral lasix was resumed following admission, however home doses of  blocker and dilt were initially held.  Resp status  improved and she was weaned off of BiPAP.  She was seen by ID and presentation was not felt to be consistent with PNA.  Imaging felt to favor CHF.  Abx d/c'd.  IV lasix ordered.  Last night, she was noted to have elevated HRs - sinus tach w/ freq PACs.  With this she was dyspneic.   blocker and dilt resumed this AM.  Currently feels better.  No chest pain.  Inpatient Medications    . allopurinol  100 mg Oral Daily  . apixaban  5 mg Oral BID  . diltiazem  120 mg Oral Daily  . levothyroxine  112 mcg Oral QAC breakfast  . metoprolol tartrate  50 mg Oral BID  .  simvastatin  20 mg Oral Daily  . sodium chloride flush  3 mL Intravenous Q12H    Family History    Family History  Problem Relation Age of Onset  . Heart attack Father 69  . Hypertension Father   . Hyperlipidemia Father   . Heart disease Brother        valvular disease     Social History    Social History   Socioeconomic History  . Marital status: Married    Spouse name: Not on file  . Number of children: Not on file  . Years of education: Not on file  . Highest education level: Not on file  Social Needs  . Financial resource strain: Not on file  . Food insecurity - worry: Not on file  . Food insecurity - inability: Not on file  . Transportation needs - medical: Not on file  . Transportation needs - non-medical: Not on file  Occupational History  . Occupation: retired  Tobacco Use  . Smoking status: Never Smoker  . Smokeless tobacco: Never Used  Substance and Sexual Activity  . Alcohol use: Yes    Alcohol/week: 0.6 oz    Types: 1 Glasses of wine per week    Comment: social   . Drug use: No  . Sexual activity: Not on file  Other Topics Concern  . Not on file  Social History Narrative  . Not on file     Review of Systems    General:  No chills, fever, night sweats or weight changes.  Cardiovascular:  No chest pain, +++ dyspnea, minimal leg edema, no orthopnea, palpitations, paroxysmal nocturnal dyspnea. Dermatological: No rash, lesions/masses Respiratory: No cough, +++ dyspnea, +++ wheezing @ home. Urologic: No hematuria, dysuria Abdominal:   No nausea, vomiting, diarrhea, bright red blood per rectum, melena, or hematemesis Neurologic:  No visual changes, wkns, changes in mental status. All other systems reviewed and are otherwise negative except as noted above.  Physical Exam    Blood pressure (!) 150/70, pulse 96, temperature 98.6 F (37 C), temperature source Oral, resp. rate 18, height 5\' 3"  (1.6 m), weight 175 lb 0.7 oz (79.4 kg), SpO2 93 %.    General: Pleasant, NAD Psych: Normal affect. Neuro: Alert and oriented X 3. Moves all extremities spontaneously. HEENT: Normal  Neck: Supple without bruits or JVD. Lungs:  Resp regular and unlabored, CTA. Heart: RRR no s3, s4, or murmurs. Abdomen: Soft, non-tender, non-distended, BS + x 4.  Extremities: No clubbing, cyanosis or edema. DP/PT/Radials 2+ and equal bilaterally.  Labs     Recent Labs    12/05/17 0141 12/05/17 0749 12/05/17 1314 12/05/17 1900  TROPONINI <0.03 <0.03 <0.03 <0.03   Lab Results  Component Value Date   WBC 28.0 (H) 12/07/2017  HGB 11.0 (L) 12/07/2017   HCT 33.2 (L) 12/07/2017   MCV 97.0 12/07/2017   PLT 529 (H) 12/07/2017    Recent Labs  Lab 12/05/17 0141  12/07/17 0447  NA 120*   < > 133*  K 6.1*   < > 3.0*  CL 87*   < > 96*  CO2 19*   < > 24  BUN 36*   < > 26*  CREATININE 1.60*   < > 1.27*  CALCIUM 9.6   < > 8.8*  PROT 7.4  --   --   BILITOT 0.8  --   --   ALKPHOS 93  --   --   ALT 30  --   --   AST 32  --   --   GLUCOSE 139*   < > 112*   < > = values in this interval not displayed.   Lab Results  Component Value Date   CHOL 281 (H) 06/07/2017   HDL 30 (L) 06/07/2017   LDLCALC UNABLE TO CALCULATE IF TRIGLYCERIDE OVER 400 mg/dL 06/07/2017   TRIG 448 (H) 06/07/2017      Radiology Studies    Ct Angio Chest Pe W/cm &/or Wo Cm  Result Date: 12/05/2017 CLINICAL DATA:  Acute onset of shortness of breath. Recent knee replacement. Generalized chest pain and left toe discoloration. EXAM: CT ANGIOGRAPHY CHEST WITH CONTRAST TECHNIQUE: Multidetector CT imaging of the chest was performed using the standard protocol during bolus administration of intravenous contrast. Multiplanar CT image reconstructions and MIPs were obtained to evaluate the vascular anatomy. CONTRAST:  40mL ISOVUE-370 IOPAMIDOL (ISOVUE-370) INJECTION 76% COMPARISON:  Chest radiograph performed earlier today at 1:38 a.m. FINDINGS: Cardiovascular: There is no evidence of  pulmonary embolus. Evaluation for pulmonary embolus is suboptimal in areas of airspace consolidation. The heart is borderline normal in size. Calcification is noted at the aortic and mitral valves. Scattered coronary artery calcifications are seen. Mediastinum/Nodes: A mildly enlarged 1.5 cm subcarinal node is noted. No additional mediastinal lymphadenopathy is seen. No pericardial effusion is identified. The visualized portions of the thyroid gland are unremarkable. No axillary lymphadenopathy is appreciated. Lungs/Pleura: Small bilateral pleural effusions are noted. Patchy bilateral airspace opacities are noted, most prominent at the right upper lobe and left lower lobe. This is concerning for pneumonia superimposed on pulmonary edema. No pneumothorax is identified. No dominant mass is seen. Upper Abdomen: The visualized portions of the liver and spleen are unremarkable. Stones are noted dependently within the gallbladder. The visualized portions of the pancreas and adrenal glands are within normal limits. Minimal contrast is noted within the left renal calyces. Scattered calcification is seen along the proximal abdominal aorta, and relatively diffuse calcification is seen along the superior mesenteric artery and the proximal renal arteries bilaterally. Musculoskeletal: No acute osseous abnormalities are identified. The visualized musculature is unremarkable in appearance. Review of the MIP images confirms the above findings. IMPRESSION: 1. No evidence of pulmonary embolus. 2. Small bilateral pleural effusions. Patchy bilateral airspace opacities, most prominent at the right upper lobe and left lower lobe. This is concerning for pneumonia superimposed on pulmonary edema. 3. Mildly enlarged 1.5 cm subcarinal node may reflect underlying infection. 4. Scattered coronary artery calcifications. Calcification at the aortic and mitral valves. 5. Cholelithiasis. 6. Relatively diffuse calcification along the superior  mesenteric artery and the proximal renal arteries bilaterally. Would correlate for evidence of refractory hypertension. Aortic Atherosclerosis (ICD10-I70.0). Electronically Signed   By: Garald Balding M.D.   On: 12/05/2017 04:04  Dg Chest Port 1 View  Result Date: 12/05/2017 CLINICAL DATA:  Dyspnea.  Recent knee arthroplasty surgery. EXAM: PORTABLE CHEST 1 VIEW COMPARISON:  11/29/2017 FINDINGS: Central airspace opacities may represent pulmonary edema. This is worsened. Mild vascular and interstitial prominence. Unchanged cardiomegaly. IMPRESSION: Probable congestive heart failure. Infectious infiltrates are less likely. Electronically Signed   By: Andreas Newport M.D.   On: 12/05/2017 02:03   Dg Chest Port 1 View  Result Date: 11/29/2017 CLINICAL DATA:  Shortness of breath. The patient underwent knee replacement yesterday. History of hypertension, peripheral vascular disease and coronary artery disease, previous CVA. EXAM: PORTABLE CHEST 1 VIEW COMPARISON:  None in PACs FINDINGS: The lungs are adequately inflated. The interstitial markings are increased. There is patchy airspace opacity in the left perihilar and right infrahilar regions. The pulmonary vascularity is engorged. The cardiac silhouette is enlarged. There is no pleural effusion. There is calcification in the wall of the aortic arch. There are degenerative changes of the right AC joint. IMPRESSION: CHF with mild interstitial and early alveolar edema. Thoracic aortic atherosclerosis. Electronically Signed   By: David  Martinique M.D.   On: 11/29/2017 11:04   Dg Knee Left Port  Result Date: 11/28/2017 CLINICAL DATA:  Post left knee replacement EXAM: PORTABLE LEFT KNEE - 1-2 VIEW COMPARISON:  None. FINDINGS: Portable views of the left knee show the femoral and tibial components of the left total knee replacement to be in good position and alignment. No complicating features are seen. A small amount air is noted within soft tissues postoperatively  and surgical drains are present. IMPRESSION: Left total knee replacement components in good position and alignment. No complicating features. Electronically Signed   By: Ivar Drape M.D.   On: 11/28/2017 11:47    ECG & Cardiac Imaging    SB, 54, 1st deg AVB, no acute st/t changes.  Assessment & Plan    1.  Acute on chronic diast CHF:  Known h/o diast CHF with an EF of 55-60% by echo last week.  Also w/ pulm HTN and PASP of 9mmHg.  During admission last week for L TKA, she became volume overloaded with IVF and required diuresis.  Volume was stable after two doses of IV lasix and we rec d/c on lasix 20 daily.  Per d/c summary, she was advised to stop lasix @ discharge.  Following discharge, she had some dyspnea, which worsened on 12/9.  She was hypoxic upon arrival and there was initially concern for PNA on CT.  She did require BiPAP initially and ICU admission but has since stabilized.  Seen by ID and presentation not felt to be consistent with PNA  more likely CHF.  In that setting oral and then IV lasix resumed with symptomatic improvement.  Abx d/c'd.  Na initially very low @ 118 - ? Hypervolemia  now 133 following IVF and now diuresis.  Appears euvolemic today.  Daily wts and I/Os.  She received IV lasix this AM.  Rec resumption of lasix 20 mg daily tomorrow.  BP has been labile, mostly 140's to 150's.   blocker and dilt resumed.  Follow blood pressures on home meds. Lisinopril-hctz d/c'd last admission.  Would not resume this given hyperkalemia earlier in admission.  2.  PAF:  Maintaining sinus, though did have tachycardia and freq PACs overnight - rates into 130's @ times.   blocker and dilt resumed (off x 3 days).  Follow.  Cont eliquis.  3.  Essential HTN:  Follow in setting of resumption  of home meds.  4.  CKD III-IV:  Follow on lasix.  5.  Moderate MS:  Will follow as outpt.  6.  Hypokalemia:  Supplementation ordered.  Signed, Murray Hodgkins, NP 12/07/2017, 11:31 AM  For  questions or updates, please contact   Please consult [email or web address] for contact info under Cardiology/STEMI.

## 2017-12-07 NOTE — Progress Notes (Signed)
Spoke with MD Lance Coon see new orders

## 2017-12-07 NOTE — Progress Notes (Signed)
  Subjective:  Patient is seen in her hospital room laying in bed. Her husband is at the bedside. Patient had an episode of tachycardia overnight. Patient reports left knee pain as mild. Breathing is improving.  Patient on eliquis for paroxysmal atrial fibrillation.  Objective:   VITALS:   Vitals:   12/06/17 1953 12/07/17 0401 12/07/17 0856 12/07/17 1056  BP: (!) 143/78 140/80 (!) 156/85 (!) 150/70  Pulse: 87 94 93 96  Resp: 17  18   Temp: (!) 97.5 F (36.4 C) 98.8 F (37.1 C) 98.6 F (37 C)   TempSrc: Oral Oral Oral   SpO2: 95% 94% 93%   Weight:      Height:        PHYSICAL EXAM: Left lower extremity: Neurovascular intact Sensation intact distally Intact pulses distally Dorsiflexion/Plantar flexion intact Incision: dressing C/D/I No cellulitis present Compartment soft  LABS  Results for orders placed or performed during the hospital encounter of 12/05/17 (from the past 24 hour(s))  Urinalysis, Complete w Microscopic     Status: Abnormal   Collection Time: 12/06/17  6:16 PM  Result Value Ref Range   Color, Urine STRAW (A) YELLOW   APPearance CLEAR (A) CLEAR   Specific Gravity, Urine 1.005 1.005 - 1.030   pH 6.0 5.0 - 8.0   Glucose, UA NEGATIVE NEGATIVE mg/dL   Hgb urine dipstick NEGATIVE NEGATIVE   Bilirubin Urine NEGATIVE NEGATIVE   Ketones, ur NEGATIVE NEGATIVE mg/dL   Protein, ur NEGATIVE NEGATIVE mg/dL   Nitrite NEGATIVE NEGATIVE   Leukocytes, UA NEGATIVE NEGATIVE   RBC / HPF NONE SEEN 0 - 5 RBC/hpf   WBC, UA 0-5 0 - 5 WBC/hpf   Bacteria, UA NONE SEEN NONE SEEN   Squamous Epithelial / LPF 0-5 (A) NONE SEEN  CBC     Status: Abnormal   Collection Time: 12/07/17  4:47 AM  Result Value Ref Range   WBC 28.0 (H) 3.6 - 11.0 K/uL   RBC 3.42 (L) 3.80 - 5.20 MIL/uL   Hemoglobin 11.0 (L) 12.0 - 16.0 g/dL   HCT 33.2 (L) 35.0 - 47.0 %   MCV 97.0 80.0 - 100.0 fL   MCH 32.2 26.0 - 34.0 pg   MCHC 33.2 32.0 - 36.0 g/dL   RDW 15.1 (H) 11.5 - 14.5 %   Platelets 529  (H) 150 - 440 K/uL  Basic metabolic panel     Status: Abnormal   Collection Time: 12/07/17  4:47 AM  Result Value Ref Range   Sodium 133 (L) 135 - 145 mmol/L   Potassium 3.0 (L) 3.5 - 5.1 mmol/L   Chloride 96 (L) 101 - 111 mmol/L   CO2 24 22 - 32 mmol/L   Glucose, Bld 112 (H) 65 - 99 mg/dL   BUN 26 (H) 6 - 20 mg/dL   Creatinine, Ser 1.27 (H) 0.44 - 1.00 mg/dL   Calcium 8.8 (L) 8.9 - 10.3 mg/dL   GFR calc non Af Amer 40 (L) >60 mL/min   GFR calc Af Amer 46 (L) >60 mL/min   Anion gap 13 5 - 15    No results found.  Assessment/Plan:     Active Problems:   Pneumonia  Cardiologist in consultation with this patient. Awaiting their recommendations. Hyperkalemia and hyponatremia is improving. Continue eliquis.  Continue PT as patient can tolerate.  Patient will follow up in my office in 7-10 days following discharge.    Thornton Park , MD 12/07/2017, 12:17 PM

## 2017-12-08 ENCOUNTER — Telehealth: Payer: Self-pay | Admitting: Family

## 2017-12-08 DIAGNOSIS — I5033 Acute on chronic diastolic (congestive) heart failure: Secondary | ICD-10-CM

## 2017-12-08 LAB — BASIC METABOLIC PANEL
ANION GAP: 11 (ref 5–15)
BUN: 23 mg/dL — ABNORMAL HIGH (ref 6–20)
CALCIUM: 8.8 mg/dL — AB (ref 8.9–10.3)
CO2: 28 mmol/L (ref 22–32)
Chloride: 94 mmol/L — ABNORMAL LOW (ref 101–111)
Creatinine, Ser: 1.18 mg/dL — ABNORMAL HIGH (ref 0.44–1.00)
GFR, EST AFRICAN AMERICAN: 50 mL/min — AB (ref >60)
GFR, EST NON AFRICAN AMERICAN: 43 mL/min — AB (ref >60)
Glucose, Bld: 139 mg/dL — ABNORMAL HIGH (ref 65–99)
Potassium: 3.3 mmol/L — ABNORMAL LOW (ref 3.5–5.1)
Sodium: 133 mmol/L — ABNORMAL LOW (ref 135–145)

## 2017-12-08 MED ORDER — FUROSEMIDE 20 MG PO TABS: 20.0000 mg | ORAL_TABLET | Freq: Every day | ORAL | 0 refills | Status: DC

## 2017-12-08 MED ORDER — DILTIAZEM HCL ER COATED BEADS 180 MG PO CP24
180.0000 mg | ORAL_CAPSULE | Freq: Every day | ORAL | Status: DC
Start: 2017-12-09 — End: 2017-12-08

## 2017-12-08 MED ORDER — LISINOPRIL 20 MG PO TABS
40.0000 mg | ORAL_TABLET | Freq: Every day | ORAL | Status: DC
  Administered 2017-12-08: 40 mg via ORAL
  Filled 2017-12-08: qty 2

## 2017-12-08 MED ORDER — POTASSIUM CHLORIDE CRYS ER 20 MEQ PO TBCR: 40.0000 meq | EXTENDED_RELEASE_TABLET | Freq: Once | ORAL | Status: DC

## 2017-12-08 MED ORDER — POTASSIUM CHLORIDE CRYS ER 20 MEQ PO TBCR
20.0000 meq | EXTENDED_RELEASE_TABLET | Freq: Every day | ORAL | Status: DC
  Administered 2017-12-08: 20 meq via ORAL
  Filled 2017-12-08: qty 1

## 2017-12-08 NOTE — Care Management Note (Signed)
Case Management Note  Patient Details  Name: Caroline [name] MRN: [id number] Date of Birth: March 18, 1941  Subjective/Objective:  Spoke with patient and son at bedside regarding discharge plan. [facility] unable to accept patient at this time. Referral [facility] for home health RN and PT. Both are agreeable to POC.                   Action/Plan: [facility] for RN and PT.   Expected Discharge Date:  12/08/17               Expected Discharge Plan:  [place]  In-House Referral:     Discharge planning Services  CM Consult  Post Acute Care Choice:  Home Health Choice offered to:  Spouse, Patient  DME Arranged:    DME Agency:     HH Arranged:  PT, RN [place] Agency:  [facility]  Status of Service:  Completed, signed off  If discussed at [place] of Stay Meetings, dates discussed:    Additional Comments:  Jolly Mango, RN 12/08/2017, 1:16 PM

## 2017-12-08 NOTE — Discharge Summary (Signed)
[facility] [place] NAME: Caroline [name]    MR#:  [id number]  DATE OF BIRTH:  1941-09-15  DATE OF ADMISSION:  12/05/2017 ADMITTING PHYSICIAN: Saundra Shelling, MD  DATE OF DISCHARGE: 12/08/2017  PRIMARY CARE PHYSICIAN: Remi Haggard, FNP    ADMISSION DIAGNOSIS:  Hyponatremia [E87.1] Respiratory distress [R06.03] Hypoxia [R09.02] Community acquired pneumonia, unspecified laterality [J18.9] Congestive heart failure, unspecified HF chronicity, unspecified heart failure type ([place]) [I50.9]  DISCHARGE DIAGNOSIS:  Acute on Chronic Diastolic CHF Chronic Leucocytosis Hyponatremia due to volume overload SECONDARY DIAGNOSIS:   Past Medical History:  Diagnosis Date  . Arthritis   . Chronic diastolic CHF (congestive heart failure) ([place])    a. 05/2017 Echo: 65-70%, mild AI, moderate MR, mildly dilated LA; b. 11/2017 Echo: EF 55-60%, no rwma, Gr2 DD, mod MS, mild MR, mod dil LA, PASP 47mmHg.  . CKD (chronic kidney disease), stage IV ([place])   . History of kidney stones 01/2017  . Hypertension   . Hypothyroidism   . Non-obstructive CAD (coronary artery disease)    a. 2005 Cath: LCX 20-13m, otw nl cors.  Marland Kitchen PAF (paroxysmal atrial fibrillation) ([place])    a. On Eliquis; b. CHADS2VASc => 8 (CHF, HTN, age x 2, stroke x 2, vascular disease, female)  . Stroke [facility])    TIA 9/17 & 04/06/2017    HOSPITAL COURSE:   76 year old female with past medical history significant for CK D stage III, hypertension, diastolic CHF, paroxysmal atrial fibrillation, history of TIA and arthritis who recently had a left total knee replacement surgery done last week presents with acute hypoxic respiratory failure.  #1 acute hypoxic respiratory failure-secondary to acute on chronic diastolic CHF - Recently hospitalized for left total knee replacement surgery. And got IvF also -MRSA PCR was negative, not on  vancomycin at this time. D/c cefepime since procalcitonin x2  negative -bnp x3 elevated -Blood cultures are negative -Off BiPAP and on  2 L oxygen via nasal cannula. -Appreciate Id consult---agrre with plan to d/c abxs - continue current management and wean off o2 -Lasix 20 mg daily  2. Hyponatremia and hypochloremia due to chf -d/ced  IV fluids. Likely fluid overload. -Restarted Lasix. Improving sodium now 118---133 -Known history of chronic diastolic CHF.  3. Hyperkalemia-patient was taking lisinopril and potassium supplements at home due to hypokalemia. -After Kayexalate, her hyperkalemia is resolved. Lisinopril and potassium supplements on hold.   4. CK D stage III-creatinine seems to be at baseline, however sodium is improved  5. Left total knee replacement surgery-stable. Orthopedics consulted. -Continue brace and pain medications as needed -Weight bearing as tolerated and will need home health at discharge  6. Chronic Leukocytosis  Denies any diarrhea. No other source of infection identified. Baseline count 15-16K  7. Paroxysmal atrial fibrillation-heart rate well controlled.  -On metoprolol and cardizem -On eliquis for anticoagulation  Overall stable fro d/c to home HHPT and RN  CONSULTS OBTAINED:  Treatment Team:  Anthonette Legato, MD Leonel Ramsay, MD Minna Merritts, MD  DRUG ALLERGIES:  No Known Allergies  DISCHARGE MEDICATIONS:   Allergies as of 12/08/2017   No Known Allergies     Medication List    STOP taking these medications   cloNIDine 0.1 MG tablet Commonly known as:  CATAPRES   potassium chloride 10 MEQ CR capsule Commonly known as:  MICRO-K     TAKE these medications   acetaminophen 500 MG tablet Commonly known as:  TYLENOL Take 1,000  mg by mouth daily as needed (for pain.).   allopurinol 100 MG tablet Commonly known as:  ZYLOPRIM Take 100 mg by mouth daily.   apixaban 5 MG Tabs tablet Commonly known as:  ELIQUIS Take 1 tablet (5 mg total) by mouth 2 (two) times daily.    CARTIA XT 120 MG 24 hr capsule Generic drug:  diltiazem TAKE 1 CAPSULE BY MOUTH ONCE DAILY   cyanocobalamin 1000 MCG/ML injection Commonly known as:  (VITAMIN B-12) Inject 1,000 mcg into the muscle every 30 (thirty) days.   furosemide 20 MG tablet Commonly known as:  LASIX Take 1 tablet (20 mg total) by mouth daily. Start taking on:  12/09/2017   KLOR-CON M10 10 MEQ tablet Generic drug:  potassium chloride Take 10 mEq by mouth 2 (two) times daily.   levothyroxine 112 MCG tablet Commonly known as:  SYNTHROID, LEVOTHROID Take 112 mcg by mouth daily before breakfast.   LINIMENTS EX Apply 1 application topically 3 (three) times daily as needed (for pain). Horse Lininment   lisinopril 40 MG tablet Commonly known as:  PRINIVIL,ZESTRIL Take 1 tablet (40 mg total) by mouth daily.   metoprolol tartrate 50 MG tablet Commonly known as:  LOPRESSOR Take 1 tablet (50 mg total) by mouth 2 (two) times daily.   oxyCODONE 5 MG immediate release tablet Commonly known as:  Oxy IR/ROXICODONE Take 1 tablet (5 mg total) by mouth every 4 (four) hours as needed for moderate pain ((score 4 to 6)).   simvastatin 20 MG tablet Commonly known as:  ZOCOR Take 20 mg by mouth daily.            Durable Medical Equipment  (From admission, onward)        Start     Ordered   12/08/17 1157  For home use only DME Walker rolling  Once    Question:  Patient needs a walker to treat with the following condition  Answer:  CHF (congestive heart failure) ([place])   12/08/17 1156      If you experience worsening of your admission symptoms, develop shortness of breath, life threatening emergency, suicidal or homicidal thoughts you must seek medical attention immediately by calling 911 or calling your MD immediately  if symptoms less severe.  You Must read complete instructions/literature along with all the possible adverse reactions/side effects for all the Medicines you take and that have been prescribed  to you. Take any new Medicines after you have completely understood and accept all the possible adverse reactions/side effects.   Please note  You were cared for by a hospitalist during your hospital stay. If you have any questions about your discharge medications or the care you received while you were in the hospital after you are discharged, you can call the unit and asked to speak with the hospitalist on call if the hospitalist that took care of you is not available. Once you are discharged, your primary care physician will handle any further medical issues. Please note that NO REFILLS for any discharge medications will be authorized once you are discharged, as it is imperative that you return to your primary care physician (or establish a relationship with a primary care physician if you do not have one) for your aftercare needs so that they can reassess your need for medications and monitor your lab values. Today   SUBJECTIVE    Doing well Family In the room VITAL SIGNS:  Blood pressure 139/71, pulse 74, temperature 97.7 F (36.5 C), temperature source  Oral, resp. rate 18, height 5\' 3"  (1.6 m), weight 79.4 kg (175 lb 0.7 oz), SpO2 96 %.  I/O:    Intake/Output Summary (Last 24 hours) at 12/08/2017 1301 Last data filed at 12/08/2017 0900 Gross per 24 hour  Intake 840 ml  Output 200 ml  Net 640 ml    PHYSICAL EXAMINATION:  GENERAL:  76 y.o.-year-old patient lying in the bed with no acute distress.  EYES: Pupils equal, round, reactive to light and accommodation. No scleral icterus. Extraocular muscles intact.  HEENT: Head atraumatic, normocephalic. Oropharynx and nasopharynx clear.  NECK:  Supple, no jugular venous distention. No thyroid enlargement, no tenderness.  LUNGS: Normal breath sounds bilaterally, no wheezing, rales,rhonchi or crepitation. No use of accessory muscles of respiration.  CARDIOVASCULAR: S1, S2 normal. No murmurs, rubs, or gallops.  ABDOMEN: Soft, non-tender,  non-distended. Bowel sounds present. No organomegaly or mass.  EXTREMITIES: No pedal edema, cyanosis, or clubbing.  NEUROLOGIC: Cranial nerves II through XII are intact. Muscle strength 5/5 in all extremities. Sensation intact. Gait not checked.  PSYCHIATRIC: The patient is alert and oriented x 3.  SKIN: No obvious rash, lesion, or ulcer.   DATA REVIEW:   CBC  Recent Labs  Lab 12/07/17 0447  WBC 28.0*  HGB 11.0*  HCT 33.2*  PLT 529*    Chemistries  Recent Labs  Lab 12/05/17 0141  12/08/17 1011  NA 120*   < > 133*  K 6.1*   < > 3.3*  CL 87*   < > 94*  CO2 19*   < > 28  GLUCOSE 139*   < > 139*  BUN 36*   < > 23*  CREATININE 1.60*   < > 1.18*  CALCIUM 9.6   < > 8.8*  AST 32  --   --   ALT 30  --   --   ALKPHOS 93  --   --   BILITOT 0.8  --   --    < > = values in this interval not displayed.    Microbiology Results   Recent Results (from the past 240 hour(s))  Urine Culture     Status: None   Collection Time: 11/30/17  7:24 PM  Result Value Ref Range Status   Specimen Description URINE, RANDOM  Final   Special Requests NONE  Final   Culture   Final    NO GROWTH Performed at [facility], [address] [place], [place] [address]    Report Status 12/02/2017 FINAL  Final  Culture, blood (routine x 2)     Status: None (Preliminary result)   Collection Time: 12/05/17  3:45 AM  Result Value Ref Range Status   Specimen Description BLOOD LEFT ARM  Final   Special Requests BOTTLES DRAWN AEROBIC AND ANAEROBIC [place]  Final   Culture NO GROWTH 3 DAYS  Final   Report Status PENDING  Incomplete  Culture, blood (routine x 2)     Status: None (Preliminary result)   Collection Time: 12/05/17  4:34 AM  Result Value Ref Range Status   Specimen Description BLOOD LEFT ARM  Final   Special Requests BOTTLES DRAWN AEROBIC AND ANAEROBIC BCAV  Final   Culture NO GROWTH 3 DAYS  Final   Report Status PENDING  Incomplete  MRSA PCR Screening     Status: None   Collection Time:  12/05/17  6:19 AM  Result Value Ref Range Status   MRSA by PCR NEGATIVE NEGATIVE Final  Comment:        The GeneXpert MRSA Assay (FDA approved for NASAL specimens only), is one component of a comprehensive MRSA colonization surveillance program. It is not intended to diagnose MRSA infection nor to guide or monitor treatment for MRSA infections.     RADIOLOGY:  No results found.   Management plans discussed with the patient, family and they are in agreement.  CODE STATUS:     Code Status Orders  (From admission, onward)        Start     Ordered   12/05/17 0712  Full code  Continuous     12/05/17 0711    Code Status History    Date Active Date Inactive Code Status Order ID Comments User Context   11/28/2017 12:37 12/01/2017 19:27 Full Code [id number]  Thornton Park, MD Inpatient   06/06/2017 18:02 06/07/2017 18:12 Full Code [id number]  Nicholes Mango, MD ED    Advance Directive Documentation     Most Recent Value  Type of Advance Directive  Healthcare Power of Attorney  Pre-existing out of facility DNR order (yellow form or pink MOST form)  No data  "MOST" Form in Place?  No data      TOTAL TIME TAKING CARE OF THIS PATIENT: *40* minutes.    Fritzi Mandes M.D on 12/08/2017 at 1:01 PM  Between 7am to 6pm - Pager - [phone] After 6pm go to [email or web address] - password [email or web address] [facility]  Office  [phone]  CC: Primary care physician; Remi Haggard, FNP

## 2017-12-08 NOTE — Progress Notes (Signed)
Physical Therapy Treatment Patient Details Name: Caroline [name] MRN: [id number] DOB: [birth year] Today's Date: 12/08/2017    History of Present Illness 76 y/o female who underwent L TKA 12/4, went home and returns with pneumonia/weakness.     PT Comments    Pt did well with PT session this afternoon and was able to ambulate 75 ft, negotiate up/down steps w/ only supervision.  She did exercises well with SLRs, PROM >90 and overall showed relatively good quad control and great effort.  Pt should be safe to return home with HHPT.    Follow Up Recommendations  Home health PT     Equipment Recommendations       Recommendations for Other Services       Precautions / Restrictions Precautions Precautions: Fall;Knee Restrictions LLE Weight Bearing: Weight bearing as tolerated    Mobility  Bed Mobility Overal bed mobility: Modified Independent       Supine to sit: Modified independent (Device/Increase time)        Transfers Overall transfer level: Modified independent Equipment used: Rolling walker (2 wheeled) Transfers: Sit to/from Stand Sit to Stand: Supervision         General transfer comment: cues for hand placement, explaination of positioning when getting back to chair and to sit  Ambulation/Gait Ambulation/Gait assistance: Min guard Ambulation Distance (Feet): 75 Feet Assistive device: Rolling walker (2 wheeled)       General Gait Details: Pt with improved ambulation with consistent cadence and was able to maintain forward walker motion w/o assist (with light assist to advance walker was able to walk at community appropriate speed).     Stairs Stairs: Yes   Stair Management: Step to pattern;One rail Left Number of Stairs: 4 General stair comments: Pt was able to negotiate up/down steps with good confidence and safety though she did need consistent cuing/encouragement  Wheelchair Mobility    Modified Rankin (Stroke Patients Only)       Balance  Overall balance assessment: Modified Independent                                          Cognition Arousal/Alertness: Awake/alert Behavior During Therapy: WFL for tasks assessed/performed Overall Cognitive Status: Within Functional Limits for tasks assessed                                        Exercises Total Joint Exercises Ankle Circles/Pumps: AROM;10 reps Quad Sets: Strengthening;10 reps Gluteal Sets: Strengthening;10 reps Short Arc Quad: Strengthening;AROM;10 reps Heel Slides: Strengthening;10 reps Hip ABduction/ADduction: Strengthening;10 reps Straight Leg Raises: AROM;10 reps Knee Flexion: PROM;AROM;5 reps Goniometric ROM: 0-93    General Comments        Pertinent Vitals/Pain Pain Assessment: 0-10 Pain Score: 3 (increases with ROM acts)    Home Living                      Prior Function            PT Goals (current goals can now be found in the care plan section) Progress towards PT goals: Progressing toward goals    Frequency    7X/week      PT Plan Current plan remains appropriate    Co-evaluation  AM-PAC PT "6 Clicks" Daily Activity  Outcome Measure  Difficulty turning over in bed (including adjusting bedclothes, sheets and blankets)?: None Difficulty moving from lying on back to sitting on the side of the bed? : None Difficulty sitting down on and standing up from a chair with arms (e.g., wheelchair, bedside commode, etc,.)?: None Help needed moving to and from a bed to chair (including a wheelchair)?: None Help needed walking in hospital room?: None Help needed climbing 3-5 steps with a railing? : A Little 6 Click Score: 23    End of Session Equipment Utilized During Treatment: Gait belt Activity Tolerance: Patient tolerated treatment well Patient left: with chair alarm set;with call bell/phone within reach;with family/visitor present Nurse Communication: Mobility status PT  Visit Diagnosis: Muscle weakness (generalized) (M62.81);Difficulty in walking, not elsewhere classified (R26.2);Pain Pain - Right/Left: Left Pain - part of body: Knee     Time: 7989-2119 PT Time Calculation (min) (ACUTE ONLY): 31 min  Charges:  $Gait Training: 8-22 mins $Therapeutic Exercise: 8-22 mins                    G Codes:       Kreg Shropshire, DPT 12/08/2017, 4:08 PM

## 2017-12-08 NOTE — Progress Notes (Signed)
Progress Note  Patient Name: Caroline [name] Date of Encounter: 12/08/2017  Primary Cardiologist: Ida Rogue, MD  Subjective   Feeling much better.  Lying flat.  No chest pain, dyspnea, palpitations.  PAF last night for nearly an hour - asymptomatic/sleeping.  Inpatient Medications    Scheduled Meds: . allopurinol  100 mg Oral Daily  . apixaban  5 mg Oral BID  . diltiazem  120 mg Oral Daily  . furosemide  20 mg Oral Daily  . levothyroxine  112 mcg Oral QAC breakfast  . metoprolol tartrate  50 mg Oral BID  . simvastatin  20 mg Oral Daily  . sodium chloride flush  3 mL Intravenous Q12H   Continuous Infusions: . sodium chloride     PRN Meds: sodium chloride, acetaminophen **OR** acetaminophen, albuterol, ondansetron **OR** ondansetron (ZOFRAN) IV, oxyCODONE, senna-docusate, sodium chloride flush   Vital Signs    Vitals:   12/07/17 1552 12/07/17 2015 12/08/17 0525 12/08/17 0735  BP: (!) 149/76 (!) 155/82 (!) 152/65 139/71  Pulse: 75 82 70 74  Resp: 16 19 19 18   Temp: 98 F (36.7 C) 98.5 F (36.9 C) 97.7 F (36.5 C)   TempSrc: Oral Oral Oral   SpO2: 95% 98% 97% 96%  Weight:      Height:        Intake/Output Summary (Last 24 hours) at 12/08/2017 1130 Last data filed at 12/08/2017 0900 Gross per 24 hour  Intake 840 ml  Output 200 ml  Net 640 ml   Filed Weights   12/05/17 0134 12/05/17 0611  Weight: 158 lb (71.7 kg) 175 lb 0.7 oz (79.4 kg)    Physical Exam   GEN: Well nourished, well developed, in no acute distress.  HEENT: Grossly normal.  Neck: Supple, JVD ~ 12 cm, no carotid bruits, or masses. Cardiac: RRR, no murmurs, rubs, or gallops. No clubbing, cyanosis, edema.  Radials/DP/PT 2+ and equal bilaterally.  Respiratory:  Respirations regular and unlabored, clear to auscultation bilaterally. GI: Soft, nontender, nondistended, BS + x 4. MS: no deformity or atrophy. Skin: warm and dry, no rash. Neuro:  Strength and sensation are intact. Psych:  AAOx3.  Normal affect.  Labs    Chemistry Recent Labs  Lab 12/05/17 0141  12/06/17 0552 12/07/17 0447 12/08/17 1011  NA 120*   < > 127* 133* 133*  K 6.1*   < > 3.7 3.0* 3.3*  CL 87*   < > 93* 96* 94*  CO2 19*   < > 22 24 28   GLUCOSE 139*   < > 123* 112* 139*  BUN 36*   < > 34* 26* 23*  CREATININE 1.60*   < > 1.63* 1.27* 1.18*  CALCIUM 9.6   < > 9.0 8.8* 8.8*  PROT 7.4  --   --   --   --   ALBUMIN 3.5  --   --   --   --   AST 32  --   --   --   --   ALT 30  --   --   --   --   ALKPHOS 93  --   --   --   --   BILITOT 0.8  --   --   --   --   GFRNONAA 30*   < > 29* 40* 43*  GFRAA 35*   < > 34* 46* 50*  ANIONGAP 14   < > 12 13 11    < > = values in  this interval not displayed.     Hematology Recent Labs  Lab 12/05/17 0141 12/06/17 0552 12/07/17 0447  WBC 18.8* 29.8* 28.0*  RBC 3.39* 3.13* 3.42*  HGB 11.3* 10.2* 11.0*  HCT 32.5* 30.6* 33.2*  MCV 95.9 97.5 97.0  MCH 33.2 32.6 32.2  MCHC 34.7 33.4 33.2  RDW 14.6* 14.8* 15.1*  PLT 616* 464* 529*    Cardiac Enzymes Recent Labs  Lab 12/05/17 0141 12/05/17 0749 12/05/17 1314 12/05/17 1900  TROPONINI <0.03 <0.03 <0.03 <0.03      BNP Recent Labs  Lab 12/05/17 0141  BNP 636.0*     Radiology    No results found.  Telemetry    RSR - run of Afib/flutter between 22:48 and 23:43 last night - Personally Reviewed  Patient Profile     76 y.o. female with a history of HFpEF, HTN, PAF, CKD IV, HL, prior TIA/CVA, bilat carotid dzs, and OA s/p recent L TKA admitted 12/9 with progressive dyspnea, ? PNA (not felt to be likely), hyponatremia, and volume overload.  Assessment & Plan    1.  Acute on chronic diastolic CHF:  Known h/o diast CHF w/ EF of 55-60% by echo last week.  Also w/ PAH and PASP of 81mmHg.  Last week, noted to have volume overload in the setting of IVF after L TKA.  Improved with 2 doses of IV lasix.  Was supposed to be dc'd on oral lasix but instead, this was d/c'd per d/c summary.  She thinks she  may have taken it anyway.  Readmitted 12/9 with progressive dyspnea with concern for PNA and pulm edema.  Also hyponatremic.  Given saline and abx initially.  Symptomatic improvement with resumption of PO lasix and one dose of IV lasix the other night.  Euvolemic on exam this AM.  Net + for admission however.  No wt.  She feels good and is eager to go home.  HR controlled.  BP up slightly.  Will titrate dilt to 180 daily. Cont  blocker and current dose of lasix - 20 mg daily (bumped creat with 20 bid during last admission).  Weigh prior to d/c to establish dry wt.  2.  PAF:  In sinus currently but had nearly 1 hr of afib/flutter last night.  She was sleeping @ the time per family.  As above, will titrate dilt to 180 daily as bp has been trending higher.  Cont  blocker and eliquis.  3.  Essential HTN: titrating dilt as above. Prev on zestoretic.  Recently d/c'd.  K was high on admission, thus avoiding resumption of acei.  4.  CKD III-IV: creat stable.  5.  Mod MS: plan to follow as outpt.  6.  Hypokalemia/Hyponatremia: supp K.  Na stable.  7.  Acute resp failure:  Seen by ID.  Presentation not felt to be consistent w/ PNA.  WBC remain up in setting of steroids on admission.  Signed, Murray Hodgkins, NP  12/08/2017, 11:30 AM    For questions or updates, please contact   Please consult [email or web address] for contact info under Cardiology/STEMI.

## 2017-12-08 NOTE — Progress Notes (Signed)
  Subjective:  Patient feels much better today. Her breathing is improved. She denies any significant left knee pain. She is done well with physical therapy while an inpatient. Patient is been cleared by medicine to go home. She'll go home with home services.  Objective:   VITALS:   Vitals:   12/07/17 1552 12/07/17 2015 12/08/17 0525 12/08/17 0735  BP: (!) 149/76 (!) 155/82 (!) 152/65 139/71  Pulse: 75 82 70 74  Resp: 16 19 19 18   Temp: 98 F (36.7 C) 98.5 F (36.9 C) 97.7 F (36.5 C)   TempSrc: Oral Oral Oral   SpO2: 95% 98% 97% 96%  Weight:      Height:        PHYSICAL EXAM: Left lower extremity: Neurovascular intact Sensation intact distally Intact pulses distally Dorsiflexion/Plantar flexion intact Incision: dressing C/D/I No cellulitis present Compartment soft  LABS  Results for orders placed or performed during the hospital encounter of 12/05/17 (from the past 24 hour(s))  Basic metabolic panel     Status: Abnormal   Collection Time: 12/08/17 10:11 AM  Result Value Ref Range   Sodium 133 (L) 135 - 145 mmol/L   Potassium 3.3 (L) 3.5 - 5.1 mmol/L   Chloride 94 (L) 101 - 111 mmol/L   CO2 28 22 - 32 mmol/L   Glucose, Bld 139 (H) 65 - 99 mg/dL   BUN 23 (H) 6 - 20 mg/dL   Creatinine, Ser 1.18 (H) 0.44 - 1.00 mg/dL   Calcium 8.8 (L) 8.9 - 10.3 mg/dL   GFR calc non Af Amer 43 (L) >60 mL/min   GFR calc Af Amer 50 (L) >60 mL/min   Anion gap 11 5 - 15    No results found.  Assessment/Plan:     Active Problems:   Pneumonia Patient will follow up with me in 1 week in the office for staple removal. She will get home PT in the meantime.  Patient is on Eliquis for DVT prophylaxis.  She is doing well from the orthopedic standpoint.   Thornton Park , MD 12/08/2017, 1:12 PM

## 2017-12-08 NOTE — Telephone Encounter (Signed)
TCM....  Patient is being discharged   They saw Dr. [name] for a/c Diastolic HF   They are scheduled to see Darylene Price on 12/17 at 2:20 pm     They need to be seen asap

## 2017-12-08 NOTE — Progress Notes (Signed)
Discharge instructions explained to pt/ verbalized an understanding / iv and tele removed/ transported off unit via wheelchair.  

## 2017-12-10 LAB — CULTURE, BLOOD (ROUTINE X 2)
CULTURE: NO GROWTH
Culture: NO GROWTH

## 2017-12-11 ENCOUNTER — Other Ambulatory Visit: Payer: Self-pay

## 2017-12-11 ENCOUNTER — Ambulatory Visit: Payer: Medicare Other | Admitting: Family

## 2017-12-11 ENCOUNTER — Emergency Department: Payer: Medicare Other

## 2017-12-11 ENCOUNTER — Inpatient Hospital Stay
Admission: EM | Admit: 2017-12-11 | Discharge: 2017-12-15 | DRG: 291 | Disposition: A | Discharge status: Home Health | Payer: Medicare Other | Attending: Internal Medicine | Admitting: Internal Medicine

## 2017-12-11 DIAGNOSIS — I130 Hypertensive heart and chronic kidney disease with heart failure and stage 1 through stage 4 chronic kidney disease, or unspecified chronic kidney disease: Secondary | ICD-10-CM | POA: Diagnosis present

## 2017-12-11 DIAGNOSIS — Z96652 Presence of left artificial knee joint: Secondary | ICD-10-CM | POA: Diagnosis present

## 2017-12-11 DIAGNOSIS — I050 Rheumatic mitral stenosis: Secondary | ICD-10-CM | POA: Diagnosis present

## 2017-12-11 DIAGNOSIS — D72829 Elevated white blood cell count, unspecified: Secondary | ICD-10-CM

## 2017-12-11 DIAGNOSIS — Z8249 Family history of ischemic heart disease and other diseases of the circulatory system: Secondary | ICD-10-CM

## 2017-12-11 DIAGNOSIS — I480 Paroxysmal atrial fibrillation: Secondary | ICD-10-CM | POA: Diagnosis present

## 2017-12-11 DIAGNOSIS — J918 Pleural effusion in other conditions classified elsewhere: Secondary | ICD-10-CM | POA: Diagnosis present

## 2017-12-11 DIAGNOSIS — I5033 Acute on chronic diastolic (congestive) heart failure: Secondary | ICD-10-CM | POA: Diagnosis present

## 2017-12-11 DIAGNOSIS — J90 Pleural effusion, not elsewhere classified: Secondary | ICD-10-CM

## 2017-12-11 DIAGNOSIS — I5032 Chronic diastolic (congestive) heart failure: Secondary | ICD-10-CM

## 2017-12-11 DIAGNOSIS — Z90711 Acquired absence of uterus with remaining cervical stump: Secondary | ICD-10-CM

## 2017-12-11 DIAGNOSIS — Z79890 Hormone replacement therapy (postmenopausal): Secondary | ICD-10-CM | POA: Diagnosis not present

## 2017-12-11 DIAGNOSIS — I2510 Atherosclerotic heart disease of native coronary artery without angina pectoris: Secondary | ICD-10-CM | POA: Diagnosis present

## 2017-12-11 DIAGNOSIS — T502X5A Adverse effect of carbonic-anhydrase inhibitors, benzothiadiazides and other diuretics, initial encounter: Secondary | ICD-10-CM | POA: Diagnosis not present

## 2017-12-11 DIAGNOSIS — I509 Heart failure, unspecified: Secondary | ICD-10-CM

## 2017-12-11 DIAGNOSIS — R778 Other specified abnormalities of plasma proteins: Secondary | ICD-10-CM

## 2017-12-11 DIAGNOSIS — R110 Nausea: Secondary | ICD-10-CM

## 2017-12-11 DIAGNOSIS — J189 Pneumonia, unspecified organism: Secondary | ICD-10-CM | POA: Diagnosis present

## 2017-12-11 DIAGNOSIS — N183 Chronic kidney disease, stage 3 (moderate): Secondary | ICD-10-CM | POA: Diagnosis present

## 2017-12-11 DIAGNOSIS — I272 Pulmonary hypertension, unspecified: Secondary | ICD-10-CM | POA: Diagnosis present

## 2017-12-11 DIAGNOSIS — R0902 Hypoxemia: Secondary | ICD-10-CM

## 2017-12-11 DIAGNOSIS — Z87442 Personal history of urinary calculi: Secondary | ICD-10-CM

## 2017-12-11 DIAGNOSIS — I248 Other forms of acute ischemic heart disease: Secondary | ICD-10-CM | POA: Diagnosis not present

## 2017-12-11 DIAGNOSIS — E039 Hypothyroidism, unspecified: Secondary | ICD-10-CM | POA: Diagnosis present

## 2017-12-11 DIAGNOSIS — R7989 Other specified abnormal findings of blood chemistry: Secondary | ICD-10-CM

## 2017-12-11 DIAGNOSIS — Z7901 Long term (current) use of anticoagulants: Secondary | ICD-10-CM

## 2017-12-11 DIAGNOSIS — Z9889 Other specified postprocedural states: Secondary | ICD-10-CM

## 2017-12-11 DIAGNOSIS — Z8673 Personal history of transient ischemic attack (TIA), and cerebral infarction without residual deficits: Secondary | ICD-10-CM

## 2017-12-11 DIAGNOSIS — N179 Acute kidney failure, unspecified: Secondary | ICD-10-CM | POA: Diagnosis not present

## 2017-12-11 DIAGNOSIS — J9601 Acute respiratory failure with hypoxia: Secondary | ICD-10-CM | POA: Diagnosis present

## 2017-12-11 DIAGNOSIS — I5031 Acute diastolic (congestive) heart failure: Secondary | ICD-10-CM | POA: Diagnosis not present

## 2017-12-11 LAB — URINALYSIS, COMPLETE (UACMP) WITH MICROSCOPIC
BACTERIA UA: NONE SEEN
Bilirubin Urine: NEGATIVE
Glucose, UA: NEGATIVE mg/dL
Hgb urine dipstick: NEGATIVE
KETONES UR: NEGATIVE mg/dL
Leukocytes, UA: NEGATIVE
Nitrite: NEGATIVE
PH: 6 (ref 5.0–8.0)
Protein, ur: 100 mg/dL — AB
SPECIFIC GRAVITY, URINE: 1.013 (ref 1.005–1.030)

## 2017-12-11 LAB — BASIC METABOLIC PANEL
Anion gap: 11 (ref 5–15)
BUN: 30 mg/dL — AB (ref 6–20)
CHLORIDE: 99 mmol/L — AB (ref 101–111)
CO2: 25 mmol/L (ref 22–32)
CREATININE: 1.24 mg/dL — AB (ref 0.44–1.00)
Calcium: 9 mg/dL (ref 8.9–10.3)
GFR calc Af Amer: 47 mL/min — ABNORMAL LOW (ref >60)
GFR calc non Af Amer: 41 mL/min — ABNORMAL LOW (ref >60)
Glucose, Bld: 136 mg/dL — ABNORMAL HIGH (ref 65–99)
Potassium: 4.2 mmol/L (ref 3.5–5.1)
SODIUM: 135 mmol/L (ref 135–145)

## 2017-12-11 LAB — DIFFERENTIAL
Basophils Absolute: 0.2 10*3/uL — ABNORMAL HIGH (ref 0–0.1)
Basophils Relative: 1 %
EOS PCT: 0 %
Eosinophils Absolute: 0 10*3/uL (ref 0–0.7)
LYMPHS ABS: 1.9 10*3/uL (ref 1.0–3.6)
LYMPHS PCT: 8 %
MONO ABS: 1.4 10*3/uL — AB (ref 0.2–0.9)
Monocytes Relative: 6 %
NEUTROS PCT: 85 %
Neutro Abs: 19.7 10*3/uL — ABNORMAL HIGH (ref 1.4–6.5)

## 2017-12-11 LAB — INFLUENZA PANEL BY PCR (TYPE A & B)
INFLAPCR: NEGATIVE
INFLBPCR: NEGATIVE

## 2017-12-11 LAB — CBC
HCT: 34.4 % — ABNORMAL LOW (ref 35.0–47.0)
Hemoglobin: 11.3 g/dL — ABNORMAL LOW (ref 12.0–16.0)
MCH: 32.3 pg (ref 26.0–34.0)
MCHC: 32.8 g/dL (ref 32.0–36.0)
MCV: 98.6 fL (ref 80.0–100.0)
Platelets: 470 10*3/uL — ABNORMAL HIGH (ref 150–440)
RBC: 3.49 MIL/uL — ABNORMAL LOW (ref 3.80–5.20)
RDW: 15.4 % — AB (ref 11.5–14.5)
WBC: 30.2 10*3/uL — AB (ref 3.6–11.0)

## 2017-12-11 LAB — BRAIN NATRIURETIC PEPTIDE: B Natriuretic Peptide: 1399 pg/mL — ABNORMAL HIGH (ref 0.0–100.0)

## 2017-12-11 LAB — TROPONIN I: Troponin I: 0.05 ng/mL (ref <0.03)

## 2017-12-11 LAB — HEPATIC FUNCTION PANEL
ALT: 25 U/L (ref 14–54)
AST: 27 U/L (ref 15–41)
Albumin: 2.9 g/dL — ABNORMAL LOW (ref 3.5–5.0)
Alkaline Phosphatase: 79 U/L (ref 38–126)
BILIRUBIN DIRECT: 0.2 mg/dL (ref 0.1–0.5)
BILIRUBIN INDIRECT: 0.8 mg/dL (ref 0.3–0.9)
Total Bilirubin: 1 mg/dL (ref 0.3–1.2)
Total Protein: 6.5 g/dL (ref 6.5–8.1)

## 2017-12-11 LAB — LACTIC ACID, PLASMA: Lactic Acid, Venous: 1.5 mmol/L (ref 0.5–1.9)

## 2017-12-11 MED ORDER — LISINOPRIL 20 MG PO TABS
40.0000 mg | ORAL_TABLET | Freq: Every day | ORAL | Status: DC
  Administered 2017-12-12 – 2017-12-15 (×4): 40 mg via ORAL
  Filled 2017-12-11 (×4): qty 2

## 2017-12-11 MED ORDER — FUROSEMIDE 10 MG/ML IJ SOLN
60.0000 mg | Freq: Once | INTRAMUSCULAR | Status: AC
  Administered 2017-12-11: 60 mg via INTRAVENOUS
  Filled 2017-12-11: qty 8

## 2017-12-11 MED ORDER — APIXABAN 5 MG PO TABS
5.0000 mg | ORAL_TABLET | Freq: Two times a day (BID) | ORAL | Status: DC
  Administered 2017-12-11 – 2017-12-12 (×2): 5 mg via ORAL
  Filled 2017-12-11 (×2): qty 1

## 2017-12-11 MED ORDER — DEXTROSE 5 % IV SOLN
2.0000 g | Freq: Two times a day (BID) | INTRAVENOUS | Status: DC
  Administered 2017-12-11 – 2017-12-12 (×3): 2 g via INTRAVENOUS
  Filled 2017-12-11 (×6): qty 2

## 2017-12-11 MED ORDER — ALLOPURINOL 100 MG PO TABS
100.0000 mg | ORAL_TABLET | Freq: Every day | ORAL | Status: DC
  Administered 2017-12-12 – 2017-12-15 (×4): 100 mg via ORAL
  Filled 2017-12-11 (×4): qty 1

## 2017-12-11 MED ORDER — SIMVASTATIN 20 MG PO TABS
20.0000 mg | ORAL_TABLET | Freq: Every day | ORAL | Status: DC
  Administered 2017-12-12 – 2017-12-15 (×4): 20 mg via ORAL
  Filled 2017-12-11 (×4): qty 1

## 2017-12-11 MED ORDER — DOCUSATE SODIUM 100 MG PO CAPS
100.0000 mg | ORAL_CAPSULE | Freq: Two times a day (BID) | ORAL | Status: DC
  Administered 2017-12-11 – 2017-12-14 (×6): 100 mg via ORAL
  Filled 2017-12-11 (×7): qty 1

## 2017-12-11 MED ORDER — ASPIRIN 81 MG PO CHEW
324.0000 mg | CHEWABLE_TABLET | Freq: Once | ORAL | Status: AC
  Administered 2017-12-11: 324 mg via ORAL
  Filled 2017-12-11: qty 4

## 2017-12-11 MED ORDER — IOPAMIDOL (ISOVUE-370) INJECTION 76%
60.0000 mL | Freq: Once | INTRAVENOUS | Status: AC | PRN
  Administered 2017-12-11: 60 mL via INTRAVENOUS

## 2017-12-11 MED ORDER — ONDANSETRON HCL 4 MG/2ML IJ SOLN
4.0000 mg | Freq: Once | INTRAMUSCULAR | Status: AC
  Administered 2017-12-11: 4 mg via INTRAVENOUS
  Filled 2017-12-11: qty 2

## 2017-12-11 MED ORDER — DILTIAZEM HCL ER COATED BEADS 120 MG PO CP24
120.0000 mg | ORAL_CAPSULE | Freq: Every day | ORAL | Status: DC
  Administered 2017-12-12 – 2017-12-15 (×4): 120 mg via ORAL
  Filled 2017-12-11 (×4): qty 1

## 2017-12-11 MED ORDER — FUROSEMIDE 10 MG/ML IJ SOLN
40.0000 mg | Freq: Two times a day (BID) | INTRAMUSCULAR | Status: DC
  Administered 2017-12-11 – 2017-12-12 (×3): 40 mg via INTRAVENOUS
  Filled 2017-12-11 (×3): qty 4

## 2017-12-11 MED ORDER — OXYCODONE HCL 5 MG PO TABS: 5.0000 mg | ORAL_TABLET | ORAL | Status: DC | PRN

## 2017-12-11 MED ORDER — ACETAMINOPHEN 325 MG PO TABS
650.0000 mg | ORAL_TABLET | Freq: Four times a day (QID) | ORAL | Status: DC | PRN
  Administered 2017-12-15: 650 mg via ORAL
  Filled 2017-12-11: qty 2

## 2017-12-11 MED ORDER — LEVOTHYROXINE SODIUM 112 MCG PO TABS
112.0000 ug | ORAL_TABLET | Freq: Every day | ORAL | Status: DC
  Administered 2017-12-12 – 2017-12-14 (×3): 112 ug via ORAL
  Filled 2017-12-11 (×4): qty 1

## 2017-12-11 MED ORDER — ACETAMINOPHEN 650 MG RE SUPP: 650.0000 mg | Freq: Four times a day (QID) | RECTAL | Status: DC | PRN

## 2017-12-11 MED ORDER — METOPROLOL TARTRATE 50 MG PO TABS
50.0000 mg | ORAL_TABLET | Freq: Two times a day (BID) | ORAL | Status: DC
  Administered 2017-12-11 – 2017-12-15 (×7): 50 mg via ORAL
  Filled 2017-12-11 (×7): qty 1

## 2017-12-11 MED ORDER — NITROGLYCERIN 0.4 MG SL SUBL: 0.4000 mg | SUBLINGUAL_TABLET | SUBLINGUAL | Status: DC | PRN

## 2017-12-11 MED ORDER — LEVOFLOXACIN IN D5W 750 MG/150ML IV SOLN
750.0000 mg | Freq: Once | INTRAVENOUS | Status: AC
  Administered 2017-12-11: 750 mg via INTRAVENOUS
  Filled 2017-12-11: qty 150

## 2017-12-11 MED ORDER — ONDANSETRON HCL 4 MG PO TABS: 4.0000 mg | ORAL_TABLET | Freq: Four times a day (QID) | ORAL | Status: DC | PRN

## 2017-12-11 MED ORDER — ONDANSETRON HCL 4 MG/2ML IJ SOLN: 4.0000 mg | Freq: Four times a day (QID) | INTRAMUSCULAR | Status: DC | PRN

## 2017-12-11 NOTE — Progress Notes (Signed)
Pharmacy Antibiotic Note  Caroline [name] is a 76 y.o. female admitted on 12/11/2017 with pneumonia.  Pharmacy has been consulted for cefepime dosing.  Plan: Cefepime 2 g IV q12h  Height: 5' 2.5" (158.8 cm) Weight: 165 lb 3.2 oz (74.9 kg) IBW/kg (Calculated) : 51.25  Temp (24hrs), Avg:98.4 F (36.9 C), Min:98.4 F (36.9 C), Max:98.4 F (36.9 C)  Recent Labs  Lab 12/05/17 0141  12/05/17 2139 12/06/17 0552 12/07/17 0447 12/08/17 1011 12/11/17 1236 12/11/17 1315  WBC 18.8*  --   --  29.8* 28.0*  --   --  30.2*  CREATININE 1.60*   < > 1.57* 1.63* 1.27* 1.18*  --  1.24*  LATICACIDVEN 1.7  --   --   --   --   --  1.5  --    < > = values in this interval not displayed.    Estimated Creatinine Clearance: 36.4 mL/min (A) (by C-G formula based on SCr of 1.24 mg/dL (H)).    No Known Allergies  Antimicrobials this admission: cefepime 12/17 >>  Levofloxacin one dose 12/17  Dose adjustments this admission:  Microbiology results: 12/17 BCx: Sent 12/17 UCx: Sent   Thank you for allowing pharmacy to be a part of this patient's care.  Lenis Noon, PharmD, BCPS Clinical Pharmacist 12/11/2017 7:15 PM

## 2017-12-11 NOTE — ED Triage Notes (Signed)
Pt arrived to the ED via POV d/t SOB. Pt was discharged Friday after being admitted for the same symptoms following a total knee replacement she had Dec 4th. Pt reports nausea since Friday. denies any pain at this time. Pt oxygen sat is 88%. Pt was told before discharge that these symptoms were related to possible CHF.

## 2017-12-11 NOTE — ED Notes (Signed)
Pt returned from radiology. NAD at this time. Pt placed on monitor. Call bell in reach.

## 2017-12-11 NOTE — Progress Notes (Signed)
76yo wf admitted to room 252 from ED with CHF exacerbation.  No distress on 3LO2 per Nelson.  Cardiac monitor placed on pt and verifed with Olivia Mackie CNA.  Denies pain at this time.  Oriented to room and surroundings, husband at bedside.  CB in reach, SR up x 2.

## 2017-12-11 NOTE — ED Notes (Signed)
Pt given crackers and water per request. States she is getting anxious to get to her hospital room.

## 2017-12-11 NOTE — Progress Notes (Deleted)
   Patient ID: Caroline [name], female    DOB: 1941-04-22, 76 y.o.   MRN: [id number]  HPI  Caroline [name] is a 76 y/o female with a history of  Echo report from 11/29/17 reviewed and showed an EF of 55-60% along with moderate Caroline, mild MR and moderately elevated PA pressure of 50 mm Hg. Mildly thickened, severely calcified mitral leaflets. Last cardiac catheterization done in 2015.  Admitted 12/05/17 due to HF exacerbation. Cardiology consult obtained. Initially needed bipap and then transitioned to oxygen by nasal cannula. Had recently had [name] replacement surgery and was given IV fluids. Medications were adjusted and she was discharged home after 3 days. Admitted 11/28/17 for left [name] replacement surgery. Was discharged home after 3 days with physical therapy.   Patient presents today for her initial visit with a chief complaint   Review of Systems    Physical Exam    Assessment & Plan:  1: Chronic heart failure with preserved ejection fraction- - NYHA class  2: HTN- - BMP from 12/08/17 reviewed and showed sodium 133, potassium 3.3 and GFR 43 - saw nephrologist Caroline [name]) 09/13/17  3: Atrial fibrillation- - saw cardiologist Caroline [name]) 08/15/17

## 2017-12-11 NOTE — ED Notes (Addendum)
Unable to give report at this time per [name] on 2A

## 2017-12-11 NOTE — ED Provider Notes (Addendum)
[facility] Emergency Department Provider Note  ____________________________________________  Time seen: Approximately 12:07 PM  I have reviewed the triage vital signs and the nursing notes.   HISTORY  Chief Complaint Shortness of Breath    HPI Caroline [name] is a 76 y.o. female discharged 12/08/17 after admission for hypoxia with respiratory distress in the setting of CHF and community-acquired pneumonia, presenting with shortness of breath.  The patient reports that since discharge, she has been feeling well until last night when she began progressively more and more short of breath.  She has not had any fever or chills, congestion or rhinorrhea, sore throat or ear pain.  She has had a mild nonproductive cough.  She has not noted any lower extremity edema or calf pain, and has not experienced any chest pain, pressure, tightness or discomfort.  She has mild nausea without any vomiting, diarrhea or constipation, or abdominal discomfort.  On arrival to the emergency department, the patient has an oxygen saturation of 88% on room air.   Past Medical History:  Diagnosis Date  . Arthritis   . Chronic diastolic CHF (congestive heart failure) ([place])    a. 05/2017 Echo: 65-70%, mild AI, moderate MR, mildly dilated LA; b. 11/2017 Echo: EF 55-60%, no rwma, Gr2 DD, mod MS, mild MR, mod dil LA, PASP 44mmHg.  . CKD (chronic kidney disease), stage IV ([place])   . History of kidney stones 01/2017  . Hypertension   . Hypothyroidism   . Non-obstructive CAD (coronary artery disease)    a. 2005 Cath: LCX 20-55m, otw nl cors.  Marland Kitchen PAF (paroxysmal atrial fibrillation) ([place])    a. On Eliquis; b. CHADS2VASc => 8 (CHF, HTN, age x 2, stroke x 2, vascular disease, female)  . Stroke [facility])    TIA 9/17 & 04/06/2017    Patient Active Problem List   Diagnosis Date Noted  . Pneumonia 12/05/2017  . S/P total knee arthroplasty, left 11/28/2017  . Bilateral carotid artery stenosis 08/14/2017   . Paroxysmal atrial fibrillation ([place]) 07/14/2017  . CVA (cerebral vascular accident) ([place]) 06/07/2017  . TIA (transient ischemic attack) 10/18/2016  . Carotid stenosis 02/17/2014  . HTN (hypertension) 02/17/2014  . Hyperlipidemia 02/17/2014  . Coronary disease 02/17/2014  . Abnormal echocardiogram 02/17/2014    Past Surgical History:  Procedure Laterality Date  . ABDOMINAL HYSTERECTOMY    . CARDIAC CATHETERIZATION     Negative   . COLONOSCOPY  2010  . PARTIAL HYSTERECTOMY    . TOTAL KNEE ARTHROPLASTY Left 11/28/2017   Procedure: TOTAL KNEE ARTHROPLASTY;  Surgeon: Thornton Park, MD;  Location: [facility] ORS;  Service: Orthopedics;  Laterality: Left;    Current Outpatient Rx  . Order #: [id number] Class: Historical Med  . Order #: [id number] Class: Historical Med  . Order #: [id number] Class: Normal  . Order #: [id number] Class: Normal  . Order #: [id number] Class: Historical Med  . Order #: [id number] Class: Normal  . Order #: [id number] Class: Historical Med  . Order #: [id number] Class: Historical Med  . Order #: [id number] Class: Historical Med  . Order #: [id number] Class: Normal  . Order #: [id number] Class: Normal  . Order #: [id number] Class: Print  . Order #: [id number] Class: Historical Med    Allergies Patient has no known allergies.  Family History  Problem Relation Age of Onset  . Heart attack Father 71  . Hypertension Father   . Hyperlipidemia Father   . Heart disease Brother        valvular disease     Social History Social  History   Tobacco Use  . Smoking status: Never Smoker  . Smokeless tobacco: Never Used  Substance Use Topics  . Alcohol use: Yes    Alcohol/week: 0.6 oz    Types: 1 Glasses of wine per week    Comment: social   . Drug use: No    Review of Systems Constitutional: No fever/chills.  No lightheadedness or syncope.  No diaphoresis. Eyes: No visual changes.  No eye discharge. ENT: No sore throat. No congestion or rhinorrhea.  No ear  pain. Cardiovascular: Denies chest pain. Denies palpitations. Respiratory: Positive shortness of breath.  Positive nonproductive cough. Gastrointestinal: No abdominal pain.  Positive nausea, no vomiting.  No diarrhea.  No constipation. Genitourinary: Negative for dysuria. Musculoskeletal: Negative for back pain.  No lower extremity swelling.  No calf pain. Skin: Negative for rash. Neurological: Negative for headaches. No focal numbness, tingling or weakness.     ____________________________________________   PHYSICAL EXAM:  VITAL SIGNS: ED Triage Vitals [12/11/17 1108]  Enc Vitals Group     BP (!) 146/102     Pulse Rate 73     Resp (!) 26     Temp 98.4 F (36.9 C)     Temp Source Oral     SpO2 (!) 88 %     Weight 155 lb (70.3 kg)     Height 5\' 2"  (1.575 m)     Head Circumference      Peak Flow      Pain Score 0     Pain Loc      Pain Edu?      Excl. in [place]?     Constitutional: Alert and oriented. Well appearing and in no acute distress. Answers questions appropriately. Eyes: Conjunctivae are normal.  EOMI. No scleral icterus. Head: Atraumatic. Nose: No congestion/rhinnorhea. Mouth/Throat: Mucous membranes are moist.  Neck: No stridor.  Supple.  No JVD.  No meningismus. Cardiovascular: Normal rate, regular rhythm. No murmurs, rubs or gallops.  Respiratory: Patient has tachypnea with accessory muscle use, and retractions especially in the abdomen.  She has crackles in the bases bilaterally.  No wheezes.  She is able to speak in 5-7 word sentences without any difficulty.  On 3 L nasal cannula, the patient has oxygen saturations of 92-94% on my examination. Gastrointestinal: Soft, nontender and nondistended.  No guarding or rebound.  No peritoneal signs. Musculoskeletal: No LE edema. No ttp in the calves or palpable cords.  Negative Homan's sign. Neurologic:  A&Ox3.  Speech is clear.  Face and smile are symmetric.  EOMI.  Moves all extremities well. Skin:  Skin is warm, dry  and intact. No rash noted. Psychiatric: Mood and affect are normal. Speech and behavior are normal.  Normal judgement.  ____________________________________________   LABS (all labs ordered are listed, but only abnormal results are displayed)  Labs Reviewed  BASIC METABOLIC PANEL - Abnormal; Notable for the following components:      Result Value   Chloride 99 (*)    Glucose, Bld 136 (*)    BUN 30 (*)    Creatinine, Ser 1.24 (*)    GFR calc non Af Amer 41 (*)    GFR calc Af Amer 47 (*)    All other components within normal limits  CBC - Abnormal; Notable for the following components:   WBC 30.2 (*)    RBC 3.49 (*)    Hemoglobin 11.3 (*)    HCT 34.4 (*)    RDW 15.4 (*)  Platelets 470 (*)    All other components within normal limits  TROPONIN I - Abnormal; Notable for the following components:   Troponin I 0.05 (*)    All other components within normal limits  HEPATIC FUNCTION PANEL - Abnormal; Notable for the following components:   Albumin 2.9 (*)    All other components within normal limits  BLOOD GAS, VENOUS - Abnormal; Notable for the following components:   pH, Ven 7.46 (*)    pCO2, Ven 38 (*)    Acid-Base Excess 3.0 (*)    All other components within normal limits  CULTURE, BLOOD (ROUTINE X 2)  CULTURE, BLOOD (ROUTINE X 2)  URINE CULTURE  LACTIC ACID, PLASMA  INFLUENZA PANEL BY PCR (TYPE A & B)  LACTIC ACID, PLASMA  BRAIN NATRIURETIC PEPTIDE  URINALYSIS, COMPLETE (UACMP) WITH MICROSCOPIC   ____________________________________________  EKG  ED ECG REPORT I, Eula Listen, the attending physician, personally viewed and interpreted this ECG.   Date: 12/11/2017  EKG Time: 1100  Rate: 74  Rhythm: normal sinus rhythm  Axis: normal  Intervals:none  ST&T Change: Nonspecific T wave inversions in V1.  No ST elevation.  No STEMI.  ____________________________________________  RADIOLOGY  Dg Chest 2 View  Result Date: 12/11/2017 CLINICAL DATA:   Shortness of breath EXAM: CHEST  2 VIEW COMPARISON:  CTA chest dated 12/05/2017 FINDINGS: Bilateral upper lobe/ perihilar opacities, new/ increased from recent CTA chest, reflecting moderate interstitial edema. Small bilateral pleural effusions, left greater than right. No pneumothorax. Cardiomegaly. IMPRESSION: Cardiomegaly with moderate interstitial edema. Small bilateral pleural effusions, left greater than right. Electronically Signed   By: Julian Hy M.D.   On: 12/11/2017 11:53    ____________________________________________   PROCEDURES  Procedure(s) performed: None  Procedures  Critical Care performed: Yes, see critical care note(s) ____________________________________________   INITIAL IMPRESSION / ASSESSMENT AND PLAN / ED COURSE  Pertinent labs & imaging results that were available during my care of the patient were reviewed by me and considered in my medical decision making (see chart for details).  76 y.o. female with a history of CHF, recently treated for community-acquired pneumonia, presenting with shortness of breath and hypoxia on room air.  Overall, the patient is hypertensive and has reassuring oxygenation on 3 L nasal cannula but showing significant signs of respiratory distress with accessory muscle use and retractions.  I am most concerned about pulmonary edema from CHF exacerbation and will treat her with Lasix and nitroglycerin, which should also help her blood pressure.  The patient is not clinically having any signs or symptoms that would be consistent with pneumonia, influenza, or PE.  Aortic pathology is also very unlikely.  Patient has not had any chest pain and has no ischemic changes in her EKG so ACS or MI is unlikely to be a cause of the patient's symptoms.  At this time, we will plan to admit the patient for further evaluation and treatment.  I have reviewed the patient's medical chart.  ----------------------------------------- 1:43 PM on  12/11/2017 -----------------------------------------  At this time, I am still favoring fluid overload from CHF exacerbation for the patient's shortness of breath and hypoxia, but she does have a white blood cell count of 30.2.  I will plan to treat her with antibiotics empirically given the possibility of pulmonary infection as the source of her symptoms.  ----------------------------------------- 2:09 PM on 12/11/2017 -----------------------------------------  The patient does have an elevated troponin at 0.04 today.  This may be due to cardiac strain, but  I will treat the patient with aspirin and have the hospitalist trend this number.  At this time, the patient is being admitted for further evaluation and treatment  CRITICAL CARE Performed by: Eula Listen   Total critical care time: 40 minutes  Critical care time was exclusive of separately billable procedures and treating other patients.  Critical care was necessary to treat or prevent imminent or life-threatening deterioration.  Critical care was time spent personally by me on the following activities: development of treatment plan with patient and/or surrogate as well as nursing, discussions with consultants, evaluation of patient's response to treatment, examination of patient, obtaining history from patient or surrogate, ordering and performing treatments and interventions, ordering and review of laboratory studies, ordering and review of radiographic studies, pulse oximetry and re-evaluation of patient's condition. ____________________________________________  FINAL CLINICAL IMPRESSION(S) / ED DIAGNOSES  Final diagnoses:  Acute on chronic congestive heart failure, unspecified heart failure type (HCC)  Hypoxia  Nausea  Leukocytosis, unspecified type  Elevated troponin    Clinical Course as of Dec 13 2226  Mon Dec 11, 2017  1421 Spoken with the hospitalist about the patient's admission; we are adding a CT of the  chest to rule out PE in this patient.  The patient does have a BNP of greater than 1600, consistent with CHF exacerbation diagnosis.  [AN]    Clinical Course User Index [AN] Eula Listen, MD      NEW MEDICATIONS STARTED DURING THIS VISIT:  This SmartLink is deprecated. Use AVSMEDLIST instead to display the medication list for a patient.    Eula Listen, MD 12/11/17 1223    Eula Listen, MD 12/11/17 1344    Eula Listen, MD 12/11/17 1409    Eula Listen, MD 12/13/17 2228

## 2017-12-11 NOTE — ED Notes (Addendum)
Attempt to call report unsuccessful.

## 2017-12-11 NOTE — ED Notes (Signed)
Patient placed on 1L O2 for oxygen saturation 88%. O2 sats reading at 95% with 1L O2

## 2017-12-11 NOTE — ED Notes (Signed)
Unable to call report at this time

## 2017-12-11 NOTE — H&P (Signed)
[facility] [place] NAME: Caroline [name]    MR#:  [id number]  DATE OF BIRTH:  October 18, 1941  DATE OF ADMISSION:  12/11/2017  PRIMARY CARE PHYSICIAN: Remi Haggard, FNP   REQUESTING/REFERRING PHYSICIAN: Dr. Eula Listen  CHIEF COMPLAINT:   Chief Complaint  Patient presents with  . Shortness of Breath    HISTORY OF PRESENT ILLNESS:  Promiss Labarbera  is a 76 y.o. female with a known history of CKD, diastolic CHF, hypertension, paroxysmal afib on eliquis with prior h/o stroke and no residual neurological changes . Had a recent left total knee replacement surgery done and was discharged home came back for pneumonia and CHF exacerbation and was just discharged home 3 days ago is brought back by family secondary to worsening shortness of breath. During her recent hospitalization she was diuresed and was also treated with antibiotics for possible pneumonia. She had persistently elevated white count and was seen by infectious disease specialist as well. At the time of discharge patient was weaned off oxygen. She started feeling better respiratory wise up until her symptoms started back again yesterday. She has been consistently nauseated and has been extremely weak at home. Has not been able to use the incentive spirometer as much. She continues to take her full dose of eliquis. Yesterday her breathing was much worse, and that did not improve this morning, unable to sleep and so came to the hospital. Denies any cough, fevers or chills. Has nausea and has been having extremely poor appetite. CT chest revealing scattered infiltrates and possible congestive heart failure.  PAST MEDICAL HISTORY:   Past Medical History:  Diagnosis Date  . Arthritis   . Chronic diastolic CHF (congestive heart failure) ([place])    a. 05/2017 Echo: 65-70%, mild AI, moderate MR, mildly dilated LA; b. 11/2017 Echo: EF 55-60%, no rwma, Gr2 DD, mod MS, mild MR, mod dil LA,  PASP 80mmHg.  . CKD (chronic kidney disease), stage IV ([place])   . History of kidney stones 01/2017  . Hypertension   . Hypothyroidism   . Non-obstructive CAD (coronary artery disease)    a. 2005 Cath: LCX 20-48m, otw nl cors.  Marland Kitchen PAF (paroxysmal atrial fibrillation) ([place])    a. On Eliquis; b. CHADS2VASc => 8 (CHF, HTN, age x 2, stroke x 2, vascular disease, female)  . Stroke [facility])    TIA 9/17 & 04/06/2017    PAST SURGICAL HISTORY:   Past Surgical History:  Procedure Laterality Date  . ABDOMINAL HYSTERECTOMY    . CARDIAC CATHETERIZATION     Negative   . COLONOSCOPY  2010  . PARTIAL HYSTERECTOMY    . TOTAL KNEE ARTHROPLASTY Left 11/28/2017   Procedure: TOTAL KNEE ARTHROPLASTY;  Surgeon: Thornton Park, MD;  Location: [facility] ORS;  Service: Orthopedics;  Laterality: Left;    SOCIAL HISTORY:   Social History   Tobacco Use  . Smoking status: Never Smoker  . Smokeless tobacco: Never Used  Substance Use Topics  . Alcohol use: Yes    Alcohol/week: 0.6 oz    Types: 1 Glasses of wine per week    Comment: social     FAMILY HISTORY:   Family History  Problem Relation Age of Onset  . Heart attack Father 63  . Hypertension Father   . Hyperlipidemia Father   . Heart disease Brother        valvular disease     DRUG ALLERGIES:  No Known Allergies  REVIEW OF  SYSTEMS:   Review of Systems  Constitutional: Positive for malaise/fatigue. Negative for chills, fever and weight loss.       Loss of appetite  HENT: Negative for ear discharge, ear pain, hearing loss and nosebleeds.   Eyes: Negative for blurred vision, double vision and photophobia.  Respiratory: Positive for shortness of breath. Negative for cough, hemoptysis and wheezing.   Cardiovascular: Positive for orthopnea. Negative for chest pain, palpitations and leg swelling.  Gastrointestinal: Positive for nausea. Negative for abdominal pain, constipation, diarrhea, heartburn, melena and vomiting.  Genitourinary: Negative for  dysuria, frequency, hematuria and urgency.  Musculoskeletal: Positive for myalgias. Negative for back pain and neck pain.  Skin: Negative for rash.  Neurological: Negative for dizziness, tingling, tremors, sensory change, speech change, focal weakness and headaches.  Endo/Heme/Allergies: Does not bruise/bleed easily.  Psychiatric/Behavioral: Negative for depression.    MEDICATIONS AT HOME:   Prior to Admission medications   Medication Sig Start Date End Date Taking? Authorizing Provider  acetaminophen (TYLENOL) 500 MG tablet Take 1,000 mg by mouth daily as needed (for pain.).   Yes [provider]  allopurinol (ZYLOPRIM) 100 MG tablet Take 100 mg by mouth daily.  09/08/16  Yes [provider]  apixaban (ELIQUIS) 5 MG TABS tablet Take 1 tablet (5 mg total) by mouth 2 (two) times daily. 08/15/17  Yes Gollan, Kathlene November, MD  CARTIA XT 120 MG 24 hr capsule TAKE 1 CAPSULE BY MOUTH ONCE DAILY 11/27/17  Yes Gollan, Kathlene November, MD  cyanocobalamin (,VITAMIN B-12,) 1000 MCG/ML injection Inject 1,000 mcg into the muscle every 30 (thirty) days.   Yes [provider]  furosemide (LASIX) 20 MG tablet Take 1 tablet (20 mg total) by mouth daily. 12/09/17  Yes Fritzi Mandes, MD  KLOR-CON M10 10 MEQ tablet Take 10 mEq by mouth 2 (two) times daily. 09/15/17  Yes [provider]  levothyroxine (SYNTHROID, LEVOTHROID) 112 MCG tablet Take 112 mcg by mouth daily before breakfast. 09/05/17  Yes [provider]  LINIMENTS EX Apply 1 application topically 3 (three) times daily as needed (for pain). Horse Lininment   Yes [provider]  lisinopril (PRINIVIL,ZESTRIL) 40 MG tablet Take 1 tablet (40 mg total) by mouth daily. 12/02/17  Yes Dustin Flock, MD  metoprolol tartrate (LOPRESSOR) 50 MG tablet Take 1 tablet (50 mg total) by mouth 2 (two) times daily. 12/01/17 12/01/18 Yes Dustin Flock, MD  oxyCODONE (OXY IR/ROXICODONE) 5 MG immediate release tablet Take 1 tablet (5 mg  total) by mouth every 4 (four) hours as needed for moderate pain ((score 4 to 6)). 12/01/17  Yes Thornton Park, MD  simvastatin (ZOCOR) 20 MG tablet Take 20 mg by mouth daily.   Yes [provider]      VITAL SIGNS:  Blood pressure 126/65, pulse 69, temperature 98.4 F (36.9 C), temperature source Oral, resp. rate (!) 30, height 5\' 2"  (1.575 m), weight 70.3 kg (155 lb), SpO2 95 %.  PHYSICAL EXAMINATION:   Physical Exam  GENERAL:  76 y.o.-year-old patient lying in the bed with no acute distress. tachypneic with exertion EYES: Pupils equal, round, reactive to light and accommodation. No scleral icterus. Extraocular muscles intact.  HEENT: Head atraumatic, normocephalic. Oropharynx and nasopharynx clear.  NECK:  Supple, no jugular venous distention. No thyroid enlargement, no tenderness.  LUNGS: moving air bilaterally, but coarse crackles posteriorly, no wheezing, rhonchi or crepitation. No use of accessory muscles of respiration.  CARDIOVASCULAR: S1, S2 normal. No murmurs, rubs, or gallops.  ABDOMEN: Soft,  nontender, nondistended. Bowel sounds present. No organomegaly or mass.  EXTREMITIES: No cyanosis, or clubbing. 1+ bilateral lower extremity edema present  NEUROLOGIC: Cranial nerves II through XII are intact. Muscle strength 5/5 in all extremities. Sensation intact. Gait not checked.  Global weakness noted. PSYCHIATRIC: The patient is alert and oriented x 3.  SKIN: No obvious rash, lesion, or ulcer.   LABORATORY PANEL:   CBC Recent Labs  Lab 12/11/17 1315  WBC 30.2*  HGB 11.3*  HCT 34.4*  PLT 470*   ------------------------------------------------------------------------------------------------------------------  Chemistries  Recent Labs  Lab 12/11/17 1315  NA 135  K 4.2  CL 99*  CO2 25  GLUCOSE 136*  BUN 30*  CREATININE 1.24*  CALCIUM 9.0  AST 27  ALT 25  ALKPHOS 79  BILITOT 1.0    ------------------------------------------------------------------------------------------------------------------  Cardiac Enzymes Recent Labs  Lab 12/11/17 1315  TROPONINI 0.05*   ------------------------------------------------------------------------------------------------------------------  RADIOLOGY:  Dg Chest 2 View  Result Date: 12/11/2017 CLINICAL DATA:  Shortness of breath EXAM: CHEST  2 VIEW COMPARISON:  CTA chest dated 12/05/2017 FINDINGS: Bilateral upper lobe/ perihilar opacities, new/ increased from recent CTA chest, reflecting moderate interstitial edema. Small bilateral pleural effusions, left greater than right. No pneumothorax. Cardiomegaly. IMPRESSION: Cardiomegaly with moderate interstitial edema. Small bilateral pleural effusions, left greater than right. Electronically Signed   By: Julian Hy M.D.   On: 12/11/2017 11:53   Ct Angio Chest Pe W And/or Wo Contrast  Result Date: 12/11/2017 CLINICAL DATA:  Hypoxia. EXAM: CT ANGIOGRAPHY CHEST WITH CONTRAST TECHNIQUE: Multidetector CT imaging of the chest was performed using the standard protocol during bolus administration of intravenous contrast. Multiplanar CT image reconstructions and MIPs were obtained to evaluate the vascular anatomy. CONTRAST:  30mL ISOVUE-370 IOPAMIDOL (ISOVUE-370) INJECTION 76% COMPARISON:  CT scan of December 05, 2017. FINDINGS: Cardiovascular: There is no evidence of large central pulmonary embolus. However, due to persistent respiratory motion artifact, smaller peripheral emboli in the lower lobe branches cannot be excluded on the basis of this exam. No pericardial effusion is noted. Mediastinum/Nodes: Stable 12 mm precarinal lymph node is noted. No other significant adenopathy is noted. Thyroid gland and esophagus are unremarkable. Lungs/Pleura: No pneumothorax is noted. Large bilateral pleural effusions are noted with adjacent subsegmental atelectasis. Interval development of large airspace  opacities noted in both upper lobes most consistent with pneumonia. Smaller airspace opacities are noted in both lower lobes. Upper Abdomen: Cholelithiasis is noted. Musculoskeletal: No chest wall abnormality. No acute or significant osseous findings. Review of the MIP images confirms the above findings. IMPRESSION: There is no evidence of large central pulmonary embolus, but smaller peripheral pulmonary emboli in the lower lobe branches cannot be excluded on the basis of this exam due to persistent respiratory motion artifact. Large bilateral pleural effusions are noted with adjacent subsegmental atelectasis. Interval development of large airspace opacities in both upper lobes most consistent with pneumonia. Small airspace opacities are also noted in both lower lobes concerning for pneumonia. Aortic Atherosclerosis (ICD10-I70.0). Electronically Signed   By: Marijo Conception, M.D.   On: 12/11/2017 14:59    EKG:   Orders placed or performed during the hospital encounter of 12/11/17  . EKG 12-Lead  . EKG 12-Lead  . ED EKG within 10 minutes  . ED EKG within 10 minutes    IMPRESSION AND PLAN:   Ty Oshima  is a 76 y.o. female with a known history of CKD, diastolic CHF, hypertension, paroxysmal afib on eliquis with prior h/o stroke and no residual neurological  changes . Had a recent left total knee replacement surgery done and was discharged home came back for pneumonia and CHF exacerbation and was just discharged home 3 days ago is brought back by family secondary to worsening shortness of breath.   1. Acute hypoxic respiratory distress-secondary to acute on chronic diastolic CHF exacerbation and also multifocal pneumonia. -Started on IV Lasix, monitor on telemetry. -Cardiology consult at again. He recent echocardiogram with EF of 50-55%. - BNP significantly elevated -Needing 3 L oxygen. Wean as tolerated. Not on home oxygen - CT chest negative for pulmonary embolism  2. Multifocal  pneumonia-recently treated with antibiotics, however discontinued at discharge. White count is persistently elevated. Not on any steroids at this time. -Repeat blood cultures. Started on cefepime. MRSA PCR was negative, will hold off on vancomycin. Infectious disease consultation. -Encouraged to do incentive spirometry.  3. Paroxysmal atrial fibrillation-rate controlled. Patient on metoprolol and Cardizem. Also on eliquis for anticoagulation.  4. CK D-monitor carefully. Especially since she got contrast for her CT again.  5. Hypothyroidism-on Synthroid  6. DVT prophylaxis-already on eliquis  7. Left total knee replacement- recently seen by physical therapy during last admission. Continue physical therapy while in the hospital.   All the records are reviewed and case discussed with ED provider. Management plans discussed with the patient, family and they are in agreement.  CODE STATUS: Full Code  TOTAL TIME TAKING CARE OF THIS PATIENT: 55 minutes.    Gladstone Lighter M.D on 12/11/2017 at 3:08 PM  Between 7am to 6pm - Pager - [phone]  After 6pm go to [email or web address] - password [email or web address] [facility]  Office  [phone]  CC: Primary care physician; Remi Haggard, FNP

## 2017-12-11 NOTE — ED Notes (Signed)
Troponin 0.05 verbal readback to Dr. Mariea Clonts

## 2017-12-12 DIAGNOSIS — I248 Other forms of acute ischemic heart disease: Secondary | ICD-10-CM

## 2017-12-12 DIAGNOSIS — I480 Paroxysmal atrial fibrillation: Secondary | ICD-10-CM

## 2017-12-12 DIAGNOSIS — I050 Rheumatic mitral stenosis: Secondary | ICD-10-CM

## 2017-12-12 DIAGNOSIS — J189 Pneumonia, unspecified organism: Secondary | ICD-10-CM

## 2017-12-12 DIAGNOSIS — J9601 Acute respiratory failure with hypoxia: Secondary | ICD-10-CM

## 2017-12-12 DIAGNOSIS — I5031 Acute diastolic (congestive) heart failure: Secondary | ICD-10-CM

## 2017-12-12 LAB — CBC
HCT: 31 % — ABNORMAL LOW (ref 35.0–47.0)
Hemoglobin: 10.3 g/dL — ABNORMAL LOW (ref 12.0–16.0)
MCH: 32.6 pg (ref 26.0–34.0)
MCHC: 33.3 g/dL (ref 32.0–36.0)
MCV: 97.8 fL (ref 80.0–100.0)
PLATELETS: 399 10*3/uL (ref 150–440)
RBC: 3.17 MIL/uL — ABNORMAL LOW (ref 3.80–5.20)
RDW: 15.6 % — ABNORMAL HIGH (ref 11.5–14.5)
WBC: 18.1 10*3/uL — ABNORMAL HIGH (ref 3.6–11.0)

## 2017-12-12 LAB — TROPONIN I
TROPONIN I: 0.03 ng/mL — AB (ref <0.03)
Troponin I: 0.04 ng/mL (ref <0.03)
Troponin I: 0.03 ng/mL (ref <0.03)

## 2017-12-12 LAB — BLOOD CULTURE ID PANEL (REFLEXED)
Acinetobacter baumannii: NOT DETECTED
CANDIDA ALBICANS: NOT DETECTED
CANDIDA GLABRATA: NOT DETECTED
CANDIDA KRUSEI: NOT DETECTED
Candida parapsilosis: NOT DETECTED
Candida tropicalis: NOT DETECTED
ENTEROBACTER CLOACAE COMPLEX: NOT DETECTED
ENTEROBACTERIACEAE SPECIES: NOT DETECTED
ENTEROCOCCUS SPECIES: NOT DETECTED
ESCHERICHIA COLI: NOT DETECTED
Haemophilus influenzae: NOT DETECTED
KLEBSIELLA PNEUMONIAE: NOT DETECTED
Klebsiella oxytoca: NOT DETECTED
LISTERIA MONOCYTOGENES: NOT DETECTED
Methicillin resistance: NOT DETECTED
Neisseria meningitidis: NOT DETECTED
PROTEUS SPECIES: NOT DETECTED
Pseudomonas aeruginosa: NOT DETECTED
STAPHYLOCOCCUS SPECIES: DETECTED — AB
STREPTOCOCCUS AGALACTIAE: NOT DETECTED
STREPTOCOCCUS PYOGENES: NOT DETECTED
Serratia marcescens: NOT DETECTED
Staphylococcus aureus (BCID): NOT DETECTED
Streptococcus pneumoniae: NOT DETECTED
Streptococcus species: NOT DETECTED

## 2017-12-12 LAB — BASIC METABOLIC PANEL
Anion gap: 10 (ref 5–15)
BUN: 35 mg/dL — AB (ref 6–20)
CO2: 26 mmol/L (ref 22–32)
CREATININE: 1.48 mg/dL — AB (ref 0.44–1.00)
Calcium: 8.6 mg/dL — ABNORMAL LOW (ref 8.9–10.3)
Chloride: 99 mmol/L — ABNORMAL LOW (ref 101–111)
GFR calc Af Amer: 38 mL/min — ABNORMAL LOW (ref >60)
GFR, EST NON AFRICAN AMERICAN: 33 mL/min — AB (ref >60)
Glucose, Bld: 105 mg/dL — ABNORMAL HIGH (ref 65–99)
Potassium: 3.6 mmol/L (ref 3.5–5.1)
SODIUM: 135 mmol/L (ref 135–145)

## 2017-12-12 LAB — URINE CULTURE

## 2017-12-12 LAB — PROCALCITONIN: Procalcitonin: 0.18 ng/mL

## 2017-12-12 MED ORDER — AZITHROMYCIN 250 MG PO TABS
500.0000 mg | ORAL_TABLET | Freq: Every day | ORAL | Status: AC
  Administered 2017-12-12 – 2017-12-14 (×3): 500 mg via ORAL
  Filled 2017-12-12 (×3): qty 2

## 2017-12-12 MED ORDER — ENSURE ENLIVE PO LIQD
237.0000 mL | Freq: Two times a day (BID) | ORAL | Status: DC
  Administered 2017-12-12 – 2017-12-15 (×5): 237 mL via ORAL

## 2017-12-12 MED ORDER — ADULT MULTIVITAMIN W/MINERALS CH
1.0000 | ORAL_TABLET | Freq: Every day | ORAL | Status: DC
  Administered 2017-12-12 – 2017-12-15 (×4): 1 via ORAL
  Filled 2017-12-12 (×4): qty 1

## 2017-12-12 NOTE — Plan of Care (Signed)
  Clinical Measurements: Ability to maintain clinical measurements within normal limits will improve 12/12/2017 1242 - Not Progressing by Daron Offer, RN Note Creatinine = 1.5 today. Will continue to monitor renal status. Wenda Low [facility]

## 2017-12-12 NOTE — Progress Notes (Signed)
PHARMACY - PHYSICIAN COMMUNICATION CRITICAL VALUE ALERT - BLOOD CULTURE IDENTIFICATION (BCID)  Results for orders placed or performed during the hospital encounter of 12/11/17  Blood Culture ID Panel (Reflexed) (Collected: 12/11/2017  1:54 PM)  Result Value Ref Range   Enterococcus species NOT DETECTED NOT DETECTED   Listeria monocytogenes NOT DETECTED NOT DETECTED   Staphylococcus species DETECTED (A) NOT DETECTED   Staphylococcus aureus NOT DETECTED NOT DETECTED   Methicillin resistance NOT DETECTED NOT DETECTED   Streptococcus species NOT DETECTED NOT DETECTED   Streptococcus agalactiae NOT DETECTED NOT DETECTED   Streptococcus pneumoniae NOT DETECTED NOT DETECTED   Streptococcus pyogenes NOT DETECTED NOT DETECTED   Acinetobacter baumannii NOT DETECTED NOT DETECTED   Enterobacteriaceae species NOT DETECTED NOT DETECTED   Enterobacter cloacae complex NOT DETECTED NOT DETECTED   Escherichia coli NOT DETECTED NOT DETECTED   Klebsiella oxytoca NOT DETECTED NOT DETECTED   Klebsiella pneumoniae NOT DETECTED NOT DETECTED   Proteus species NOT DETECTED NOT DETECTED   Serratia marcescens NOT DETECTED NOT DETECTED   Haemophilus influenzae NOT DETECTED NOT DETECTED   Neisseria meningitidis NOT DETECTED NOT DETECTED   Pseudomonas aeruginosa NOT DETECTED NOT DETECTED   Candida albicans NOT DETECTED NOT DETECTED   Candida glabrata NOT DETECTED NOT DETECTED   Candida krusei NOT DETECTED NOT DETECTED   Candida parapsilosis NOT DETECTED NOT DETECTED   Candida tropicalis NOT DETECTED NOT DETECTED    Name of physician (or Provider) Contacted:  Sudini   Changes to prescribed antibiotics required:  No, most likely contaminant, continue pt on cefepime and azithromycin.   Seydou Hearns D 12/12/2017  4:29 PM

## 2017-12-12 NOTE — Consult Note (Signed)
Cardiology Consultation:   Patient ID: Caroline [name]; [id number]; Mar 05, 1941   Admit date: 12/11/2017 Date of Consult: 12/12/2017  Primary Care Provider: Remi Haggard, FNP Primary Cardiologist: [name]   Patient Profile:   Caroline [name] is a 76 y.o. female with a hx of chronic diastolic CHF, pulmonary hypertension, PAF on Eliquis, nonobstructive CAD by [facility] in 2005, moderate mitral stenosis, prior TIA/CVA, CKD stage III-IV, anemia of chronic disease, bilateral carotid artery disease, OA s/p recent LTKA, HTN, and HLD who is being seen today for the evaluation of increased SOB at the request of Dr. Tressia Miners, MD.  History of Present Illness:   Caroline [name] was recently admitted status post left total knee arthroplasty on December 4 and was initially hypertensive. She was receiving IV fluids and subsequently developed dyspnea with pulmonary edema. Echo on 12/5 showed EF 55-60%, no RWMA, Gr2DD, moderate mitral stenosis with severely calcified leaflets, mild mitral regurgitation (mean gradient 10 mmHg, valve area 1.91 cm^2, moderately dilated left atrium, PASP 50 mmHg.  We saw her during that admission and following gentle diuresis, she was transitioned back to oral Lasix. We did rec Lasix 20 mg daily at discharge. She was discharged December 7 by the orthopedic service and went home with her husband who thought he would be able to help her rehabilitate. We note on the discharge summary, and both Lasix and Zestoretic were discontinued. Pt thinks she might have continued them regardless. She was again admitted on 12/11 with worsening SOB and was noted to have multifocal PNA and acute on chronic diastolic CHF with pulmonary HTN. She was treated with ABX and diuresed. Her oxygen was weaned to room air.   Patient has continued to feel fatigued dating back to her initial discharge on 12/7. On 12/10/17 she developed worsening SOB, cough, and wheezing. Never any chest pain, palpitations,  orthopnea, LE swelling, abdominal distension, weight gain, dizziness, presyncope, or syncope. Some early satiety with associated nausea. She has been compliant with her medications. Because of her SOB she presented to [facility] where she was noted to have multifocal PNA on CTA chest with bilateral pleural effusions. CTA chest was negative for PE. BNP was noted to be elevated at 1399. WBC was significantly elevated at 30.2 (not on steriods), improved to 18.1. Troponin 0.05, not cycled. SCr 1.24 (baseline ~ 1.1-1.2) that has trended to 1.48. She was started on cefepime and IV Lasix with a documented UOP of 300 mL for the admission. Admission weight of 165 pounds is 10 pounds less than most recent discharge weight of 175 pounds on 12/08/17. Currently, notes SOB. No chest pain. Remains on 3 L via nasal cannula.   Past Medical History:  Diagnosis Date  . Arthritis   . Chronic diastolic CHF (congestive heart failure) ([place])    a. 05/2017 Echo: 65-70%, mild AI, moderate MR, mildly dilated LA; b. 11/2017 Echo: EF 55-60%, no rwma, Gr2 DD, mod MS, mild MR, mod dil LA, PASP 51mmHg.  . CKD (chronic kidney disease), stage IV ([place])   . History of kidney stones 01/2017  . Hypertension   . Hypothyroidism   . Non-obstructive CAD (coronary artery disease)    a. 2005 Cath: LCX 20-22m, otw nl cors.  Marland Kitchen PAF (paroxysmal atrial fibrillation) ([place])    a. On Eliquis; b. CHADS2VASc => 8 (CHF, HTN, age x 2, stroke x 2, vascular disease, female)  . Stroke [facility])    TIA 9/17 & 04/06/2017    Past Surgical History:  Procedure Laterality Date  . ABDOMINAL HYSTERECTOMY    . CARDIAC CATHETERIZATION     Negative   . COLONOSCOPY  2010  . PARTIAL HYSTERECTOMY    . TOTAL KNEE ARTHROPLASTY Left 11/28/2017   Procedure: TOTAL KNEE ARTHROPLASTY;  Surgeon: Thornton Park, MD;  Location: [facility] ORS;  Service: Orthopedics;  Laterality: Left;     Home Meds: Prior to Admission medications   Medication Sig Start Date End Date Taking?  Authorizing Provider  acetaminophen (TYLENOL) 500 MG tablet Take 1,000 mg by mouth daily as needed (for pain.).   Yes [provider]  allopurinol (ZYLOPRIM) 100 MG tablet Take 100 mg by mouth daily.  09/08/16  Yes [provider]  apixaban (ELIQUIS) 5 MG TABS tablet Take 1 tablet (5 mg total) by mouth 2 (two) times daily. 08/15/17  Yes Gollan, Kathlene November, MD  CARTIA XT 120 MG 24 hr capsule TAKE 1 CAPSULE BY MOUTH ONCE DAILY 11/27/17  Yes Gollan, Kathlene November, MD  cyanocobalamin (,VITAMIN B-12,) 1000 MCG/ML injection Inject 1,000 mcg into the muscle every 30 (thirty) days.   Yes [provider]  furosemide (LASIX) 20 MG tablet Take 1 tablet (20 mg total) by mouth daily. 12/09/17  Yes Fritzi Mandes, MD  KLOR-CON M10 10 MEQ tablet Take 10 mEq by mouth 2 (two) times daily. 09/15/17  Yes [provider]  levothyroxine (SYNTHROID, LEVOTHROID) 112 MCG tablet Take 112 mcg by mouth daily before breakfast. 09/05/17  Yes [provider]  LINIMENTS EX Apply 1 application topically 3 (three) times daily as needed (for pain). Horse Lininment   Yes [provider]  lisinopril (PRINIVIL,ZESTRIL) 40 MG tablet Take 1 tablet (40 mg total) by mouth daily. 12/02/17  Yes Dustin Flock, MD  metoprolol tartrate (LOPRESSOR) 50 MG tablet Take 1 tablet (50 mg total) by mouth 2 (two) times daily. 12/01/17 12/01/18 Yes Dustin Flock, MD  oxyCODONE (OXY IR/ROXICODONE) 5 MG immediate release tablet Take 1 tablet (5 mg total) by mouth every 4 (four) hours as needed for moderate pain ((score 4 to 6)). 12/01/17  Yes Thornton Park, MD  simvastatin (ZOCOR) 20 MG tablet Take 20 mg by mouth daily.   Yes [provider]    Inpatient Medications: Scheduled Meds: . allopurinol  100 mg Oral Daily  . apixaban  5 mg Oral BID  . diltiazem  120 mg Oral Daily  . docusate sodium  100 mg Oral BID  . furosemide  40 mg Intravenous Q12H  . levothyroxine  112 mcg Oral QAC breakfast  .  lisinopril  40 mg Oral Daily  . metoprolol tartrate  50 mg Oral BID  . simvastatin  20 mg Oral Daily   Continuous Infusions: . ceFEPime (MAXIPIME) IV Stopped (12/11/17 2239)   PRN Meds: acetaminophen **OR** acetaminophen, nitroGLYCERIN, ondansetron **OR** ondansetron (ZOFRAN) IV, oxyCODONE  Allergies:  No Known Allergies  Social History:   Social History   Socioeconomic History  . Marital status: Married    Spouse name: Not on file  . Number of children: Not on file  . Years of education: Not on file  . Highest education level: Not on file  Social Needs  . Financial resource strain: Not on file  . Food insecurity - worry: Not on file  . Food insecurity - inability: Not on file  . Transportation needs - medical: Not on file  . Transportation needs - non-medical: Not on file  Occupational History  . Occupation: retired  Tobacco Use  . Smoking status:  Never Smoker  . Smokeless tobacco: Never Used  Substance and Sexual Activity  . Alcohol use: Yes    Alcohol/week: 0.6 oz    Types: 1 Glasses of wine per week    Comment: social   . Drug use: No  . Sexual activity: Not on file  Other Topics Concern  . Not on file  Social History Narrative  . Not on file     Family History:   Family History  Problem Relation Age of Onset  . Heart attack Father 80  . Hypertension Father   . Hyperlipidemia Father   . Heart disease Brother        valvular disease     ROS:  Review of Systems  Constitutional: Positive for malaise/fatigue. Negative for chills, diaphoresis, fever and weight loss.  HENT: Negative for congestion.   Eyes: Negative for discharge and redness.  Respiratory: Positive for cough, shortness of breath and wheezing. Negative for hemoptysis and sputum production.   Cardiovascular: Negative for chest pain, palpitations, orthopnea, claudication, leg swelling and PND.  Gastrointestinal: Negative for abdominal pain, blood in stool, constipation, diarrhea, heartburn,  melena, nausea and vomiting.  Genitourinary: Negative for hematuria.  Musculoskeletal: Negative for falls and myalgias.  Skin: Negative for rash.  Neurological: Positive for weakness. Negative for dizziness, tingling, tremors, sensory change, speech change, focal weakness and loss of consciousness.  Endo/Heme/Allergies: Does not bruise/bleed easily.  Psychiatric/Behavioral: Negative for substance abuse. The patient is nervous/anxious.   All other systems reviewed and are negative.     Physical Exam/Data:   Vitals:   12/11/17 1830 12/11/17 1901 12/11/17 2142 12/12/17 0511  BP: (!) 140/58 (!) 145/61 133/69 125/72  Pulse: 65 70  67  Resp: (!) 25 17  16   Temp:    98.3 F (36.8 C)  TempSrc:    Oral  SpO2: 96% 98%  99%  Weight:  165 lb 3.2 oz (74.9 kg)    Height:  5' 2.5" (1.588 m)      Intake/Output Summary (Last 24 hours) at 12/12/2017 0737 Last data filed at 12/12/2017 0511 Gross per 24 hour  Intake -  Output 300 ml  Net -300 ml   Filed Weights   12/11/17 1108 12/11/17 1901  Weight: 155 lb (70.3 kg) 165 lb 3.2 oz (74.9 kg)   Body mass index is 29.73 kg/m.   Physical Exam: General: Well developed, well nourished, in no acute distress. Head: Normocephalic, atraumatic, sclera non-icteric, no xanthomas, nares without discharge.  Neck: Negative for carotid bruits. JVD mildly elevated. Lungs: Diminished breath sounds bilaterally with diffuse rhonchi and wheezing. Breathing is unlabored, on 3 L via nasal cannula.  Heart: RRR with S1 S2. II/VI diastolic murmur at the apex, no rubs, or gallops appreciated. Abdomen: Soft, non-tender, non-distended with normoactive bowel sounds. No hepatomegaly. No rebound/guarding. No obvious abdominal masses. Msk:  Strength and tone appear normal for age. Extremities: No clubbing or cyanosis. No edema. Distal pedal pulses are 2+ and equal bilaterally. Neuro: Alert and oriented X 3. No facial asymmetry. No focal deficit. Moves all extremities  spontaneously. Psych:  Responds to questions appropriately with a normal affect.   EKG:  The EKG was personally reviewed and demonstrates: NSR, 74 bpm, nonspecific st/t changes Telemetry:  Telemetry was personally reviewed and demonstrates: NSR  Weights: Filed Weights   12/11/17 1108 12/11/17 1901  Weight: 155 lb (70.3 kg) 165 lb 3.2 oz (74.9 kg)    Relevant CV Studies: TTE 11/30/2017: Study Conclusions  - Left ventricle:  The cavity size was normal. There was moderate   concentric hypertrophy. Systolic function was normal. The   estimated ejection fraction was in the range of 55% to 60%. Wall   motion was normal; there were no regional wall motion   abnormalities. Features are consistent with a pseudonormal left   ventricular filling pattern, with concomitant abnormal relaxation   and increased filling pressure (grade 2 diastolic dysfunction). - Mitral valve: Calcified annulus. Mildly thickened, severely   calcified leaflets . The findings are consistent with moderate   stenosis. There was mild regurgitation. Mean gradient (D): 10 mm   Hg. Valve area by pressure half-time: 1.91 cm^2. - Left atrium: The atrium was moderately dilated. - Pulmonary arteries: Systolic pressure was moderately increased.   PA peak pressure: 50 mm Hg (S).  Laboratory Data:  Chemistry Recent Labs  Lab 12/08/17 1011 12/11/17 1315 12/12/17 0314  NA 133* 135 135  K 3.3* 4.2 3.6  CL 94* 99* 99*  CO2 28 25 26   GLUCOSE 139* 136* 105*  BUN 23* 30* 35*  CREATININE 1.18* 1.24* 1.48*  CALCIUM 8.8* 9.0 8.6*  GFRNONAA 43* 41* 33*  GFRAA 50* 47* 38*  ANIONGAP 11 11 10     Recent Labs  Lab 12/11/17 1315  PROT 6.5  ALBUMIN 2.9*  AST 27  ALT 25  ALKPHOS 79  BILITOT 1.0   Hematology Recent Labs  Lab 12/07/17 0447 12/11/17 1315 12/12/17 0314  WBC 28.0* 30.2* 18.1*  RBC 3.42* 3.49* 3.17*  HGB 11.0* 11.3* 10.3*  HCT 33.2* 34.4* 31.0*  MCV 97.0 98.6 97.8  MCH 32.2 32.3 32.6  MCHC 33.2 32.8  33.3  RDW 15.1* 15.4* 15.6*  PLT 529* 470* 399   Cardiac Enzymes Recent Labs  Lab 12/05/17 0749 12/05/17 1314 12/05/17 1900 12/11/17 1315  TROPONINI <0.03 <0.03 <0.03 0.05*   No results for input(s): TROPIPOC in the last 168 hours.  BNP Recent Labs  Lab 12/11/17 1315  BNP 1,399.0*    DDimer No results for input(s): DDIMER in the last 168 hours.  Radiology/Studies:  Dg Chest 2 View  Result Date: 12/11/2017 IMPRESSION: Cardiomegaly with moderate interstitial edema. Small bilateral pleural effusions, left greater than right. Electronically Signed   By: Julian Hy M.D.   On: 12/11/2017 11:53   Ct Angio Chest Pe W And/or Wo Contrast  Result Date: 12/11/2017 IMPRESSION: There is no evidence of large central pulmonary embolus, but smaller peripheral pulmonary emboli in the lower lobe branches cannot be excluded on the basis of this exam due to persistent respiratory motion artifact. Large bilateral pleural effusions are noted with adjacent subsegmental atelectasis. Interval development of large airspace opacities in both upper lobes most consistent with pneumonia. Small airspace opacities are also noted in both lower lobes concerning for pneumonia. Aortic Atherosclerosis (ICD10-I70.0). Electronically Signed   By: Marijo Conception, M.D.   On: 12/11/2017 14:59    Assessment and Plan:   1. Acute respiratory distress with hypoxia: -Likely multifactorial including multifocal PNA, bilateral pleural effusions (possibly 2/2 acute on chronic diastolic CHF vs parapneumonic), acute on chronic diastolic CHF, pulmonary HTN, and anemia -Wean oxygen as able per IM -Supportive care  2. Acute on chronic diastolic CHF/bilateral pleural effusions/pulmonary hypertension: -She does appear volume up -Continue IV Lasix 40 mg q 12 hours with KCl repletion and close monitoring of renal function -She may benefit from thoracentesis (effusions may be CHF vs parapneumonic) -CHF education -Strict Is  and Os -Not on spironolactone given CKD  3.  Mitral stenosis: -Continue metoprolol and Cardizem (heart rate precludes further titration at this time) -Will need TEE as an outpatient once her acute illness is improved -Possibly exacerbating her volume overload  4. Multifocal PNA/leukocytosis: -Per IM/ID  5. PAF: -Maintaining sinus rhythm -On Cardizem and metoprolol for rate control -Eliquis 5 mg bid given CHADS2VASc of at least 8 (CHF, HTN, age x 2, TIA x 2, vascular disease, female)  5. Anemia of chronic disease: -Appears stable  6. CKD stage III-IV: -Slight bump in renal function overnight from 1/24-->1.48 -Close monitoring with diuresis   7. Elevated troponin: -Mildly elevated at 0.05, not cycled overnight -Likely supply demand ischemia in the setting of multifocal PNA, volume overload, anemia of chronic disease, and CKD stage IV -Recent echo as above, no need to repeat at this time unless troponin trends significantly upwards -Cycle troponin to rule out -Never with chest pain   For questions or updates, please contact [facility] Please consult [email or web address] for contact info under Cardiology/STEMI.   Signed, Christell Faith, PA-C [place] Pager: [phone] 12/12/2017, 7:37 AM

## 2017-12-12 NOTE — Consult Note (Signed)
[facility] Infectious Disease     Reason for Consult:  HCAP    Referring Physician: Claria Dice Date of Admission:  12/11/2017   Active Problems:   CHF exacerbation ([place])   HPI: Caroline [name] is a 76 y.o. female admitted with worsening SOB after a recent admission for CHF exacerbation. She had prior TKR done 11/28/17. Has PAF and mod Mitral stenosis.  She reports doing ok at home until a few days after DC when she developed increasing sob. No cough and no fevers.  Feels better since admission.   Past Medical History:  Diagnosis Date  . Arthritis   . Chronic diastolic CHF (congestive heart failure) ([place])    a. 05/2017 Echo: 65-70%, mild AI, moderate MR, mildly dilated LA; b. 11/2017 Echo: EF 55-60%, no rwma, Gr2 DD, mod MS, mild MR, mod dil LA, PASP 7mHg.  . CKD (chronic kidney disease), stage IV ([place]   . History of kidney stones 01/2017  . Hypertension   . Hypothyroidism   . Non-obstructive CAD (coronary artery disease)    a. 2005 Cath: LCX 20-331motw nl cors.  . Marland KitchenAF (paroxysmal atrial fibrillation) ([place]   a. On Eliquis; b. CHADS2VASc => 8 (CHF, HTN, age x 2, stroke x 2, vascular disease, female)  . [facility] ([facility]   TIA 9/17 & 04/06/2017   Past Surgical History:  Procedure Laterality Date  . ABDOMINAL HYSTERECTOMY    . CARDIAC CATHETERIZATION     Negative   . COLONOSCOPY  2010  . PARTIAL HYSTERECTOMY    . TOTAL KNEE ARTHROPLASTY Left 11/28/2017   Procedure: TOTAL KNEE ARTHROPLASTY;  Surgeon: KrThornton ParkMD;  Location: [facility] ORS;  Service: Orthopedics;  Laterality: Left;   Social History   Tobacco Use  . Smoking status: Never Smoker  . Smokeless tobacco: Never Used  Substance Use Topics  . Alcohol use: Yes    Alcohol/week: 0.6 oz    Types: 1 Glasses of wine per week    Comment: social   . Drug use: No   Family History  Problem Relation Age of Onset  . Heart attack Father 6241. Hypertension Father   . Hyperlipidemia Father   . Heart disease Brother         valvular disease     Allergies: No Known Allergies  Current antibiotics: Antibiotics Given (last 72 hours)    Date/Time Action Medication Dose Rate   12/11/17 1406 New Bag/Given   levofloxacin (LEVAQUIN) IVPB 750 mg 750 mg 100 mL/hr   12/11/17 2139 New Bag/Given   ceFEPIme (MAXIPIME) 2 g in dextrose 5 % 50 mL IVPB 2 g 100 mL/hr   12/12/17 0913 New Bag/Given   ceFEPIme (MAXIPIME) 2 g in dextrose 5 % 50 mL IVPB 2 g 100 mL/hr      MEDICATIONS: . allopurinol  100 mg Oral Daily  . apixaban  5 mg Oral BID  . diltiazem  120 mg Oral Daily  . docusate sodium  100 mg Oral BID  . furosemide  40 mg Intravenous Q12H  . levothyroxine  112 mcg Oral QAC breakfast  . lisinopril  40 mg Oral Daily  . metoprolol tartrate  50 mg Oral BID  . simvastatin  20 mg Oral Daily    Review of Systems - 11 systems reviewed and negative per HPI   OBJECTIVE: Temp:  [97.9 F (36.6 C)-98.3 F (36.8 C)] 97.9 F (36.6 C) (12/18 0809) Pulse Rate:  [63-70] 64 (12/18 0809) Resp:  [  12-28] 12 (12/18 0809) BP: (125-155)/(52-72) 127/56 (12/18 0809) SpO2:  [94 %-99 %] 98 % (12/18 0809) Weight:  [74.9 kg (165 lb 3.2 oz)] 74.9 kg (165 lb 3.2 oz) (12/17 1901) Physical Exam  Constitutional: He is oriented to person, place, and time. He appears well-developed and well-nourished. No distress.  HENT:  Mouth/Throat: Oropharynx is clear and moist. No oropharyngeal exudate.  Cardiovascular: Normal rate, regular rhythm 2/6 murmur Pulmonary/Chest: mild crackles Abdominal: Soft. Bowel sounds are normal. He exhibits no distension. There is no tenderness.  Lymphadenopathy:  He has no cervical adenopathy.  Neurological: He is alert and oriented to person, place, and time.  Skin: Skin is warm and dry. No rash noted. No erythema.  Psychiatric: He has a normal mood and affect. His behavior is normal.    LABS: Results for orders placed or performed during the hospital encounter of 12/11/17 (from the past 48 hour(s))   Lactic acid, plasma     Status: None   Collection Time: 12/11/17 12:36 PM  Result Value Ref Range   Lactic Acid, Venous 1.5 0.5 - 1.9 mmol/L  Blood gas, venous     Status: Abnormal (Preliminary result)   Collection Time: 12/11/17 12:36 PM  Result Value Ref Range   FIO2 PENDING    Delivery systems PENDING    pH, Ven 7.46 (H) 7.250 - 7.430   pCO2, Ven 38 (L) 44.0 - 60.0 mmHg   pO2, Ven PENDING 32.0 - 45.0 mmHg   Bicarbonate 27.0 20.0 - 28.0 mmol/L   Acid-Base Excess 3.0 (H) 0.0 - 2.0 mmol/L   O2 Saturation PENDING %   Patient temperature 37.0    Collection site VEIN    Sample type VEIN   Influenza panel by PCR (type A & B)     Status: None   Collection Time: 12/11/17 12:36 PM  Result Value Ref Range   Influenza A By PCR NEGATIVE NEGATIVE   Influenza B By PCR NEGATIVE NEGATIVE    Comment: (NOTE) The Xpert Xpress Flu assay is intended as an aid in the diagnosis of  influenza and should not be used as a sole basis for treatment.  This  assay is FDA approved for nasopharyngeal swab specimens only. Nasal  washings and aspirates are unacceptable for Xpert Xpress Flu testing.   Basic metabolic panel     Status: Abnormal   Collection Time: 12/11/17  1:15 PM  Result Value Ref Range   Sodium 135 135 - 145 mmol/L   Potassium 4.2 3.5 - 5.1 mmol/L   Chloride 99 (L) 101 - 111 mmol/L   CO2 25 22 - 32 mmol/L   Glucose, Bld 136 (H) 65 - 99 mg/dL   BUN 30 (H) 6 - 20 mg/dL   Creatinine, Ser 1.24 (H) 0.44 - 1.00 mg/dL   Calcium 9.0 8.9 - 10.3 mg/dL   GFR calc non Af Amer 41 (L) >60 mL/min   GFR calc Af Amer 47 (L) >60 mL/min    Comment: (NOTE) The eGFR has been calculated using the CKD EPI equation. This calculation has not been validated in all clinical situations. eGFR's persistently <60 mL/min signify possible Chronic Kidney Disease.    Anion gap 11 5 - 15  CBC     Status: Abnormal   Collection Time: 12/11/17  1:15 PM  Result Value Ref Range   WBC 30.2 (H) 3.6 - 11.0 K/uL   RBC  3.49 (L) 3.80 - 5.20 MIL/uL   Hemoglobin 11.3 (L) 12.0 - 16.0 g/dL  HCT 34.4 (L) 35.0 - 47.0 %   MCV 98.6 80.0 - 100.0 fL   MCH 32.3 26.0 - 34.0 pg   MCHC 32.8 32.0 - 36.0 g/dL   RDW 15.4 (H) 11.5 - 14.5 %   Platelets 470 (H) 150 - 440 K/uL  Troponin I     Status: Abnormal   Collection Time: 12/11/17  1:15 PM  Result Value Ref Range   Troponin I 0.05 (HH) <0.03 ng/mL    Comment: CRITICAL RESULT CALLED TO, READ BACK BY AND VERIFIED WITH ALLY RILEY ON 12/11/17 AT 1350 [facility]   Hepatic function panel     Status: Abnormal   Collection Time: 12/11/17  1:15 PM  Result Value Ref Range   Total Protein 6.5 6.5 - 8.1 g/dL   Albumin 2.9 (L) 3.5 - 5.0 g/dL   AST 27 15 - 41 U/L   ALT 25 14 - 54 U/L   Alkaline Phosphatase 79 38 - 126 U/L   Total Bilirubin 1.0 0.3 - 1.2 mg/dL   Bilirubin, Direct 0.2 0.1 - 0.5 mg/dL   Indirect Bilirubin 0.8 0.3 - 0.9 mg/dL  Brain natriuretic peptide     Status: Abnormal   Collection Time: 12/11/17  1:15 PM  Result Value Ref Range   B Natriuretic Peptide 1,399.0 (H) 0.0 - 100.0 pg/mL  Differential     Status: Abnormal   Collection Time: 12/11/17  1:15 PM  Result Value Ref Range   Neutrophils Relative % 85 %   Neutro Abs 19.7 (H) 1.4 - 6.5 K/uL   Lymphocytes Relative 8 %   Lymphs Abs 1.9 1.0 - 3.6 K/uL   Monocytes Relative 6 %   Monocytes Absolute 1.4 (H) 0.2 - 0.9 K/uL   Eosinophils Relative 0 %   Eosinophils Absolute 0.0 0 - 0.7 K/uL   Basophils Relative 1 %   Basophils Absolute 0.2 (H) 0 - 0.1 K/uL  Blood culture (routine x 2)     Status: None (Preliminary result)   Collection Time: 12/11/17  1:54 PM  Result Value Ref Range   Specimen Description BLOOD RIGHT ASSIST CONTROL    Special Requests      BOTTLES DRAWN AEROBIC AND ANAEROBIC Blood Culture adequate volume   Culture NO GROWTH < 24 HOURS    Report Status PENDING   Blood culture (routine x 2)     Status: None (Preliminary result)   Collection Time: 12/11/17  1:54 PM  Result Value Ref Range    Specimen Description BLOOD LEFT ASSIST CONTROL    Special Requests      BOTTLES DRAWN AEROBIC AND ANAEROBIC Blood Culture adequate volume   Culture NO GROWTH < 24 HOURS    Report Status PENDING   Urinalysis, Complete w Microscopic     Status: Abnormal   Collection Time: 12/11/17  1:54 PM  Result Value Ref Range   Color, Urine YELLOW (A) YELLOW   APPearance CLEAR (A) CLEAR   Specific Gravity, Urine 1.013 1.005 - 1.030   pH 6.0 5.0 - 8.0   Glucose, UA NEGATIVE NEGATIVE mg/dL   Hgb urine dipstick NEGATIVE NEGATIVE   Bilirubin Urine NEGATIVE NEGATIVE   Ketones, ur NEGATIVE NEGATIVE mg/dL   Protein, ur 100 (A) NEGATIVE mg/dL   Nitrite NEGATIVE NEGATIVE   Leukocytes, UA NEGATIVE NEGATIVE   RBC / HPF 0-5 0 - 5 RBC/hpf   WBC, UA 0-5 0 - 5 WBC/hpf   Bacteria, UA NONE SEEN NONE SEEN   Squamous Epithelial / LPF 0-5 (  A) NONE SEEN   Mucus PRESENT   CBC     Status: Abnormal   Collection Time: 12/12/17  3:14 AM  Result Value Ref Range   WBC 18.1 (H) 3.6 - 11.0 K/uL   RBC 3.17 (L) 3.80 - 5.20 MIL/uL   Hemoglobin 10.3 (L) 12.0 - 16.0 g/dL   HCT 31.0 (L) 35.0 - 47.0 %   MCV 97.8 80.0 - 100.0 fL   MCH 32.6 26.0 - 34.0 pg   MCHC 33.3 32.0 - 36.0 g/dL   RDW 15.6 (H) 11.5 - 14.5 %   Platelets 399 150 - 440 K/uL  Basic metabolic panel     Status: Abnormal   Collection Time: 12/12/17  3:14 AM  Result Value Ref Range   Sodium 135 135 - 145 mmol/L   Potassium 3.6 3.5 - 5.1 mmol/L   Chloride 99 (L) 101 - 111 mmol/L   CO2 26 22 - 32 mmol/L   Glucose, Bld 105 (H) 65 - 99 mg/dL   BUN 35 (H) 6 - 20 mg/dL   Creatinine, Ser 1.48 (H) 0.44 - 1.00 mg/dL   Calcium 8.6 (L) 8.9 - 10.3 mg/dL   GFR calc non Af Amer 33 (L) >60 mL/min   GFR calc Af Amer 38 (L) >60 mL/min    Comment: (NOTE) The eGFR has been calculated using the CKD EPI equation. This calculation has not been validated in all clinical situations. eGFR's persistently <60 mL/min signify possible Chronic Kidney Disease.    Anion gap 10 5 -  15  Troponin I (q 6hr x 3)     Status: Abnormal   Collection Time: 12/12/17  9:04 AM  Result Value Ref Range   Troponin I 0.03 (HH) <0.03 ng/mL    Comment: CRITICAL VALUE NOTED. VALUE IS CONSISTENT WITH PREVIOUSLY REPORTED/CALLED VALUE  SDR   Procalcitonin - Baseline     Status: None   Collection Time: 12/12/17  9:04 AM  Result Value Ref Range   Procalcitonin 0.18 ng/mL    Comment:        Interpretation: PCT (Procalcitonin) <= 0.5 ng/mL: Systemic infection (sepsis) is not likely. Local bacterial infection is possible. (NOTE)       Sepsis PCT Algorithm           Lower Respiratory Tract                                      Infection PCT Algorithm    ----------------------------     ----------------------------         PCT < 0.25 ng/mL                PCT < 0.10 ng/mL         Strongly encourage             Strongly discourage   discontinuation of antibiotics    initiation of antibiotics    ----------------------------     -----------------------------       PCT 0.25 - 0.50 ng/mL            PCT 0.10 - 0.25 ng/mL               OR       >80% decrease in PCT            Discourage initiation of  antibiotics      Encourage discontinuation           of antibiotics    ----------------------------     -----------------------------         PCT >= 0.50 ng/mL              PCT 0.26 - 0.50 ng/mL               AND        <80% decrease in PCT             Encourage initiation of                                             antibiotics       Encourage continuation           of antibiotics    ----------------------------     -----------------------------        PCT >= 0.50 ng/mL                  PCT > 0.50 ng/mL               AND         increase in PCT                  Strongly encourage                                      initiation of antibiotics    Strongly encourage escalation           of antibiotics                                      -----------------------------                                           PCT <= 0.25 ng/mL                                                 OR                                        > 80% decrease in PCT                                     Discontinue / Do not initiate                                             antibiotics    No components found for: ESR, C REACTIVE PROTEIN MICRO: Recent Results (from the past 720 hour(s))  Urine Culture     Status: None  Collection Time: 11/30/17  7:24 PM  Result Value Ref Range Status   Specimen Description URINE, RANDOM  Final   Special Requests NONE  Final   Culture   Final    NO GROWTH Performed at [facility], [address] [place], [place] [address]    Report Status 12/02/2017 FINAL  Final  Culture, blood (routine x 2)     Status: None   Collection Time: 12/05/17  3:45 AM  Result Value Ref Range Status   Specimen Description BLOOD LEFT ARM  Final   Special Requests BOTTLES DRAWN AEROBIC AND ANAEROBIC Roy  Final   Culture NO GROWTH 5 DAYS  Final   Report Status 12/10/2017 FINAL  Final  Culture, blood (routine x 2)     Status: None   Collection Time: 12/05/17  4:34 AM  Result Value Ref Range Status   Specimen Description BLOOD LEFT ARM  Final   Special Requests BOTTLES DRAWN AEROBIC AND ANAEROBIC BCAV  Final   Culture NO GROWTH 5 DAYS  Final   Report Status 12/10/2017 FINAL  Final  MRSA PCR Screening     Status: None   Collection Time: 12/05/17  6:19 AM  Result Value Ref Range Status   MRSA by PCR NEGATIVE NEGATIVE Final    Comment:        The GeneXpert MRSA Assay (FDA approved for NASAL specimens only), is one component of a comprehensive MRSA colonization surveillance program. It is not intended to diagnose MRSA infection nor to guide or monitor treatment for MRSA infections.   Blood culture (routine x 2)     Status: None (Preliminary result)   Collection Time: 12/11/17  1:54 PM  Result Value Ref Range Status    Specimen Description BLOOD RIGHT ASSIST CONTROL  Final   Special Requests   Final    BOTTLES DRAWN AEROBIC AND ANAEROBIC Blood Culture adequate volume   Culture NO GROWTH < 24 HOURS  Final   Report Status PENDING  Incomplete  Blood culture (routine x 2)     Status: None (Preliminary result)   Collection Time: 12/11/17  1:54 PM  Result Value Ref Range Status   Specimen Description BLOOD LEFT ASSIST CONTROL  Final   Special Requests   Final    BOTTLES DRAWN AEROBIC AND ANAEROBIC Blood Culture adequate volume   Culture NO GROWTH < 24 HOURS  Final   Report Status PENDING  Incomplete    IMAGING: Dg Chest 2 View  Result Date: 12/11/2017 CLINICAL DATA:  Shortness of breath EXAM: CHEST  2 VIEW COMPARISON:  CTA chest dated 12/05/2017 FINDINGS: Bilateral upper lobe/ perihilar opacities, new/ increased from recent CTA chest, reflecting moderate interstitial edema. Small bilateral pleural effusions, left greater than right. No pneumothorax. Cardiomegaly. IMPRESSION: Cardiomegaly with moderate interstitial edema. Small bilateral pleural effusions, left greater than right. Electronically Signed   By: Julian Hy M.D.   On: 12/11/2017 11:53   Ct Angio Chest Pe W And/or Wo Contrast  Result Date: 12/11/2017 CLINICAL DATA:  Hypoxia. EXAM: CT ANGIOGRAPHY CHEST WITH CONTRAST TECHNIQUE: Multidetector CT imaging of the chest was performed using the standard protocol during bolus administration of intravenous contrast. Multiplanar CT image reconstructions and MIPs were obtained to evaluate the vascular anatomy. CONTRAST:  59m ISOVUE-370 IOPAMIDOL (ISOVUE-370) INJECTION 76% COMPARISON:  CT scan of December 05, 2017. FINDINGS: Cardiovascular: There is no evidence of large central pulmonary embolus. However, due to persistent respiratory motion artifact, smaller peripheral emboli in the lower lobe branches  cannot be excluded on the basis of this exam. No pericardial effusion is noted. Mediastinum/Nodes: Stable  12 mm precarinal lymph node is noted. No other significant adenopathy is noted. Thyroid gland and esophagus are unremarkable. Lungs/Pleura: No pneumothorax is noted. Large bilateral pleural effusions are noted with adjacent subsegmental atelectasis. Interval development of large airspace opacities noted in both upper lobes most consistent with pneumonia. Smaller airspace opacities are noted in both lower lobes. Upper Abdomen: Cholelithiasis is noted. Musculoskeletal: No chest wall abnormality. No acute or significant osseous findings. Review of the MIP images confirms the above findings. IMPRESSION: There is no evidence of large central pulmonary embolus, but smaller peripheral pulmonary emboli in the lower lobe branches cannot be excluded on the basis of this exam due to persistent respiratory motion artifact. Large bilateral pleural effusions are noted with adjacent subsegmental atelectasis. Interval development of large airspace opacities in both upper lobes most consistent with pneumonia. Small airspace opacities are also noted in both lower lobes concerning for pneumonia. Aortic Atherosclerosis (ICD10-I70.0). Electronically Signed   By: Marijo Conception, M.D.   On: 12/11/2017 14:59   Ct Angio Chest Pe W/cm &/or Wo Cm  Result Date: 12/05/2017 CLINICAL DATA:  Acute onset of shortness of breath. Recent knee replacement. Generalized chest pain and left toe discoloration. EXAM: CT ANGIOGRAPHY CHEST WITH CONTRAST TECHNIQUE: Multidetector CT imaging of the chest was performed using the standard protocol during bolus administration of intravenous contrast. Multiplanar CT image reconstructions and MIPs were obtained to evaluate the vascular anatomy. CONTRAST:  31m ISOVUE-370 IOPAMIDOL (ISOVUE-370) INJECTION 76% COMPARISON:  Chest radiograph performed earlier today at 1:38 a.m. FINDINGS: Cardiovascular: There is no evidence of pulmonary embolus. Evaluation for pulmonary embolus is suboptimal in areas of airspace  consolidation. The heart is borderline normal in size. Calcification is noted at the aortic and mitral valves. Scattered coronary artery calcifications are seen. Mediastinum/Nodes: A mildly enlarged 1.5 cm subcarinal node is noted. No additional mediastinal lymphadenopathy is seen. No pericardial effusion is identified. The visualized portions of the thyroid gland are unremarkable. No axillary lymphadenopathy is appreciated. Lungs/Pleura: Small bilateral pleural effusions are noted. Patchy bilateral airspace opacities are noted, most prominent at the right upper lobe and left lower lobe. This is concerning for pneumonia superimposed on pulmonary edema. No pneumothorax is identified. No dominant mass is seen. Upper Abdomen: The visualized portions of the liver and spleen are unremarkable. Stones are noted dependently within the gallbladder. The visualized portions of the pancreas and adrenal glands are within normal limits. Minimal contrast is noted within the left renal calyces. Scattered calcification is seen along the proximal abdominal aorta, and relatively diffuse calcification is seen along the superior mesenteric artery and the proximal renal arteries bilaterally. Musculoskeletal: No acute osseous abnormalities are identified. The visualized musculature is unremarkable in appearance. Review of the MIP images confirms the above findings. IMPRESSION: 1. No evidence of pulmonary embolus. 2. Small bilateral pleural effusions. Patchy bilateral airspace opacities, most prominent at the right upper lobe and left lower lobe. This is concerning for pneumonia superimposed on pulmonary edema. 3. Mildly enlarged 1.5 cm subcarinal node may reflect underlying infection. 4. Scattered coronary artery calcifications. Calcification at the aortic and mitral valves. 5. Cholelithiasis. 6. Relatively diffuse calcification along the superior mesenteric artery and the proximal renal arteries bilaterally. Would correlate for evidence  of refractory hypertension. Aortic Atherosclerosis (ICD10-I70.0). Electronically Signed   By: JGarald BaldingM.D.   On: 12/05/2017 04:04   Dg Chest Port 1 View  Result Date:  12/05/2017 CLINICAL DATA:  Dyspnea.  Recent knee arthroplasty surgery. EXAM: PORTABLE CHEST 1 VIEW COMPARISON:  11/29/2017 FINDINGS: Central airspace opacities may represent pulmonary edema. This is worsened. Mild vascular and interstitial prominence. Unchanged cardiomegaly. IMPRESSION: Probable congestive heart failure. Infectious infiltrates are less likely. Electronically Signed   By: Andreas Newport M.D.   On: 12/05/2017 02:03   Dg Chest Port 1 View  Result Date: 11/29/2017 CLINICAL DATA:  Shortness of breath. The patient underwent knee replacement yesterday. History of hypertension, peripheral vascular disease and coronary artery disease, previous CVA. EXAM: PORTABLE CHEST 1 VIEW COMPARISON:  None in PACs FINDINGS: The lungs are adequately inflated. The interstitial markings are increased. There is patchy airspace opacity in the left perihilar and right infrahilar regions. The pulmonary vascularity is engorged. The cardiac silhouette is enlarged. There is no pleural effusion. There is calcification in the wall of the aortic arch. There are degenerative changes of the right AC joint. IMPRESSION: CHF with mild interstitial and early alveolar edema. Thoracic aortic atherosclerosis. Electronically Signed   By: Nation Cradle  [place] M.D.   On: 11/29/2017 11:04   Dg Knee Left Port  Result Date: 11/28/2017 CLINICAL DATA:  Post left knee replacement EXAM: PORTABLE LEFT KNEE - 1-2 VIEW COMPARISON:  None. FINDINGS: Portable views of the left knee show the femoral and tibial components of the left total knee replacement to be in good position and alignment. No complicating features are seen. A small amount air is noted within soft tissues postoperatively and surgical drains are present. IMPRESSION: Left total knee replacement components in good  position and alignment. No complicating features. Electronically Signed   By: Ivar Drape M.D.   On: 11/28/2017 11:47    Assessment:   HATSUKO BIZZARRO is a 76 y.o. female admitted with hx mod Mitral stenosis, A fib admitted with SOB,  elevated wbc after recent admission for CHF vs PNA.  At last admission her Procalc was nml and no fevers so after a few days of abx was dced off abx. She prior had  recent TKR surgery. Now again with SOB and elevated wbc. She had repeat Ct done to r/o Pe and it showed worsening multifocal fluid vs infiltrate as well as bile pleural effusions. BNP was elevated, Procacl nml at last visit but slightly elevated today.   Reviewing old records she has a chronic elevation of wbc up to 16-18 range. WBc remained elevated at 30 on admit but now down to 18. She reports feeling better, denies cough or fever. Still unclear if PNA vs CHF with her mitral stenosis likely the main issue. However will cover for PNA and check legionella ag.   Recommendations Continue cefepime and start azithromycin. Check urine leg ag. Thank you very much for allowing me to participate in the care of this patient. Please call with questions.   Caroline Marker. Ola Spurr, MD

## 2017-12-12 NOTE — Progress Notes (Signed)
[facility] [place] NAME: Caroline [name]    MR#:  [id number]  DATE OF BIRTH:  08-03-1941  SUBJECTIVE:  CHIEF COMPLAINT:   Chief Complaint  Patient presents with  . Shortness of Breath   Patient continues to be on oxygen.  Has shortness of breath.  Complains of orthopnea.   REVIEW OF SYSTEMS:  Review of Systems  Constitutional: Positive for malaise/fatigue. Negative for chills and fever.  HENT: Negative for congestion, ear discharge, hearing loss and nosebleeds.   Eyes: Negative for blurred vision and double vision.  Respiratory: Positive for shortness of breath. Negative for cough and wheezing.   Cardiovascular: Negative for chest pain, palpitations and leg swelling.  Gastrointestinal: Negative for abdominal pain, constipation, diarrhea, nausea and vomiting.  Genitourinary: Negative for dysuria.  Musculoskeletal: Positive for joint pain and myalgias.  Neurological: Negative for dizziness, focal weakness, seizures and headaches.  Psychiatric/Behavioral: Negative for depression.    DRUG ALLERGIES:  No Known Allergies  VITALS:  Blood pressure (!) 127/56, pulse 64, temperature 97.9 F (36.6 C), temperature source Oral, resp. rate 12, height 5' 2.5" (1.588 m), weight 74.9 kg (165 lb 3.2 oz), SpO2 98 %.  PHYSICAL EXAMINATION:  Physical Exam  GENERAL:  76 y.o.-year-old patient lying in the bed with no acute distress.  EYES: Pupils equal, round, reactive to light and accommodation. No scleral icterus. Extraocular muscles intact.  HEENT: Head atraumatic, normocephalic. Oropharynx and nasopharynx clear.  NECK:  Supple, no jugular venous distention. No thyroid enlargement, no tenderness.  LUNGS: Decreased breath sounds at the bases. CARDIOVASCULAR: S1, S2 normal. No murmurs, rubs, or gallops.  ABDOMEN: Soft, nontender, nondistended. Bowel sounds present. No organomegaly or mass.  EXTREMITIES: No pedal edema, cyanosis, or clubbing. Left  knee in brace NEUROLOGIC: Cranial nerves II through XII are intact. Muscle strength 5/5 in all extremities. Sensation intact. Gait not checked.  PSYCHIATRIC: The patient is alert and oriented x 3. SKIN: No obvious rash, lesion, or ulcer.    LABORATORY PANEL:   CBC Recent Labs  Lab 12/12/17 0314  WBC 18.1*  HGB 10.3*  HCT 31.0*  PLT 399   ------------------------------------------------------------------------------------------------------------------  Chemistries  Recent Labs  Lab 12/11/17 1315 12/12/17 0314  NA 135 135  K 4.2 3.6  CL 99* 99*  CO2 25 26  GLUCOSE 136* 105*  BUN 30* 35*  CREATININE 1.24* 1.48*  CALCIUM 9.0 8.6*  AST 27  --   ALT 25  --   ALKPHOS 79  --   BILITOT 1.0  --    ------------------------------------------------------------------------------------------------------------------  Cardiac Enzymes Recent Labs  Lab 12/12/17 1417  TROPONINI 0.04*   ------------------------------------------------------------------------------------------------------------------  RADIOLOGY:  Dg Chest 2 View  Result Date: 12/11/2017 CLINICAL DATA:  Shortness of breath EXAM: CHEST  2 VIEW COMPARISON:  CTA chest dated 12/05/2017 FINDINGS: Bilateral upper lobe/ perihilar opacities, new/ increased from recent CTA chest, reflecting moderate interstitial edema. Small bilateral pleural effusions, left greater than right. No pneumothorax. Cardiomegaly. IMPRESSION: Cardiomegaly with moderate interstitial edema. Small bilateral pleural effusions, left greater than right. Electronically Signed   By: Julian Hy M.D.   On: 12/11/2017 11:53   Ct Angio Chest Pe W And/or Wo Contrast  Result Date: 12/11/2017 CLINICAL DATA:  Hypoxia. EXAM: CT ANGIOGRAPHY CHEST WITH CONTRAST TECHNIQUE: Multidetector CT imaging of the chest was performed using the standard protocol during bolus administration of intravenous contrast. Multiplanar CT image reconstructions and MIPs were  obtained to evaluate the vascular anatomy. CONTRAST:  70mL ISOVUE-370 IOPAMIDOL (ISOVUE-370) INJECTION 76% COMPARISON:  CT scan of December 05, 2017. FINDINGS: Cardiovascular: There is no evidence of large central pulmonary embolus. However, due to persistent respiratory motion artifact, smaller peripheral emboli in the lower lobe branches cannot be excluded on the basis of this exam. No pericardial effusion is noted. Mediastinum/Nodes: Stable 12 mm precarinal lymph node is noted. No other significant adenopathy is noted. Thyroid gland and esophagus are unremarkable. Lungs/Pleura: No pneumothorax is noted. Large bilateral pleural effusions are noted with adjacent subsegmental atelectasis. Interval development of large airspace opacities noted in both upper lobes most consistent with pneumonia. Smaller airspace opacities are noted in both lower lobes. Upper Abdomen: Cholelithiasis is noted. Musculoskeletal: No chest wall abnormality. No acute or significant osseous findings. Review of the MIP images confirms the above findings. IMPRESSION: There is no evidence of large central pulmonary embolus, but smaller peripheral pulmonary emboli in the lower lobe branches cannot be excluded on the basis of this exam due to persistent respiratory motion artifact. Large bilateral pleural effusions are noted with adjacent subsegmental atelectasis. Interval development of large airspace opacities in both upper lobes most consistent with pneumonia. Small airspace opacities are also noted in both lower lobes concerning for pneumonia. Aortic Atherosclerosis (ICD10-I70.0). Electronically Signed   By: Marijo Conception, M.D.   On: 12/11/2017 14:59    EKG:   Orders placed or performed during the hospital encounter of 12/11/17  . EKG 12-Lead  . EKG 12-Lead  . ED EKG within 10 minutes  . ED EKG within 10 minutes    ASSESSMENT AND PLAN:   76 year old female with past medical history significant for CK D stage III, hypertension,  diastolic CHF, paroxysmal atrial fibrillation, history of TIA and arthritis who recently had a left total knee replacement surgery done last week presents with acute hypoxic respiratory failure.  *Acute hypoxic respiratory failure due to bilateral pneumonia and acute on chronic diastolic congestive heart failure Wean oxygen as tolerated.  Nebulizers as needed.  *Acute on chronic diastolic congestive heart failure.  Continue IV Lasix.  Monitor creatinine closely as this has increased today.  Counseled patient to limit her fluid and salt intake.  *Bilateral pneumonia.  On IV antibiotics.  Cultures pending.  Pro-calcitonin intermediate range.  Will repeat tomorrow.  *Bilateral pleural effusions likely due to congestive heart failure.  Will order ultrasound thoracentesis on the left for diagnostic and therapeutic reason. Continue diuresis.  *CKD stage III.  Stable.  *Recent left total knee replacement surgery-stable.pain medications as needed  * Paroxysmal atrial fibrillation-heart rate well controlled. On metoprolol.  Hold Eliquis for thoracentesis.   All the records are reviewed and case discussed with Care Management/Social Workerr. Management plans discussed with the patient, family and they are in agreement.  CODE STATUS: Full Code  TOTAL TIME TAKING CARE OF THIS PATIENT: 35 minutes.   POSSIBLE D/C IN 2-3 DAYS, DEPENDING ON CLINICAL CONDITION.  Leia Alf Jakeline Dave M.D on 12/12/2017 at 4:53 PM  Between 7am to 6pm - Pager - [phone]  After 6pm go to [email or web address] - password [email or web address] [facility]  Office  [phone]  CC: Primary care physician; Remi Haggard, FNP

## 2017-12-12 NOTE — Progress Notes (Signed)
Nutrition Education Note  RD consulted for nutrition education regarding new onset CHF.  RD provided "Low Sodium Nutrition Therapy" handout from the Academy of [employer]. Reviewed patient's dietary recall. Provided examples on ways to decrease sodium intake in diet. Discouraged intake of processed foods and use of salt shaker. Encouraged fresh fruits and vegetables as well as whole grain sources of carbohydrates to maximize fiber intake.   RD discussed why it is important for patient to adhere to diet recommendations, and emphasized the role of fluids, foods to avoid, and importance of weighing self daily. Teach back method used.  Expect good compliance.  Body mass index is 29.73 kg/m. Pt meets criteria for normal weight based on current BMI.  Current diet order is HH, patient is consuming approximately 100% of meals at this time. Labs and medications reviewed.   RD will liberalize diet and add Ensure Enlive po BID, each supplement provides 350 kcal and 20 grams of protein  No further nutrition interventions warranted at this time. RD contact information provided. If additional nutrition issues arise, please re-consult RD.   Koleen Distance MS, RD, LDN Pager #- [phone] After Hours Pager: [phone]

## 2017-12-12 NOTE — Care Management Note (Signed)
Case Management Note  Patient Details  Name: Caroline [name] MRN: [id number] Date of Birth: 14-Oct-1941  Subjective/Objective:  Recently at [facility] for PNA and CHF  12/11-12/14. Readmitted at this time for CHF exacerbation. Lives at home with her spouse. She is active with [facility] for SN and PT. Uses a walker due to a recent TKA 12/4 by Dr. Mack Guise.  Referred to CHF clinic. PCP is Threasa Alpha             Action/Plan: Will follow progression.   Expected Discharge Date:                  Expected Discharge Plan:     In-House Referral:     Discharge planning Services  CM Consult  Post Acute Care Choice:  Resumption of Svcs/PTA Provider Choice offered to:     DME Arranged:    DME Agency:     HH Arranged:  RN, PT [place] Agency:  HF Clinic - Other, [facility]  Status of Service:  In process, will continue to follow  If discussed at Long Length of Stay Meetings, dates discussed:    Additional Comments:  Jolly Mango, RN 12/12/2017, 8:40 AM

## 2017-12-13 ENCOUNTER — Inpatient Hospital Stay: Payer: Medicare Other

## 2017-12-13 DIAGNOSIS — I5033 Acute on chronic diastolic (congestive) heart failure: Secondary | ICD-10-CM

## 2017-12-13 LAB — BODY FLUID CELL COUNT WITH DIFFERENTIAL
Eos, Fluid: 0 %
Lymphs, Fluid: 85 %
Monocyte-Macrophage-Serous Fluid: 5 %
Neutrophil Count, Fluid: 10 %
Other Cells, Fluid: 0 %
WBC FLUID: 258 uL

## 2017-12-13 LAB — BASIC METABOLIC PANEL
Anion gap: 9 (ref 5–15)
BUN: 42 mg/dL — ABNORMAL HIGH (ref 6–20)
CHLORIDE: 97 mmol/L — AB (ref 101–111)
CO2: 30 mmol/L (ref 22–32)
Calcium: 8.7 mg/dL — ABNORMAL LOW (ref 8.9–10.3)
Creatinine, Ser: 1.63 mg/dL — ABNORMAL HIGH (ref 0.44–1.00)
GFR calc non Af Amer: 29 mL/min — ABNORMAL LOW (ref >60)
GFR, EST AFRICAN AMERICAN: 34 mL/min — AB (ref >60)
Glucose, Bld: 97 mg/dL (ref 65–99)
POTASSIUM: 3.6 mmol/L (ref 3.5–5.1)
SODIUM: 136 mmol/L (ref 135–145)

## 2017-12-13 LAB — BLOOD GAS, VENOUS
Acid-Base Excess: 3 mmol/L — ABNORMAL HIGH (ref 0.0–2.0)
Bicarbonate: 27 mmol/L (ref 20.0–28.0)
O2 Saturation: UNDETERMINED %
PATIENT TEMPERATURE: 37
PH VEN: 7.46 — AB (ref 7.250–7.430)
pCO2, Ven: 38 mmHg — ABNORMAL LOW (ref 44.0–60.0)
pO2, Ven: UNDETERMINED mmHg (ref 32.0–45.0)

## 2017-12-13 LAB — ALBUMIN, PLEURAL OR PERITONEAL FLUID

## 2017-12-13 LAB — LACTATE DEHYDROGENASE, PLEURAL OR PERITONEAL FLUID: LD FL: 103 U/L — AB (ref 3–23)

## 2017-12-13 LAB — PROCALCITONIN: Procalcitonin: 0.15 ng/mL

## 2017-12-13 MED ORDER — DEXTROSE 5 % IV SOLN
2.0000 g | INTRAVENOUS | Status: DC
  Administered 2017-12-13: 2 g via INTRAVENOUS
  Filled 2017-12-13 (×2): qty 2

## 2017-12-13 MED ORDER — FUROSEMIDE 10 MG/ML IJ SOLN
20.0000 mg | Freq: Two times a day (BID) | INTRAMUSCULAR | Status: DC
  Administered 2017-12-13 – 2017-12-14 (×2): 20 mg via INTRAVENOUS
  Filled 2017-12-13 (×2): qty 2

## 2017-12-13 NOTE — Procedures (Signed)
US guided left thoracentesis.  Removed 350 ml of yellow fluid.  Minimal blood loss and no immediate complication.

## 2017-12-13 NOTE — Progress Notes (Signed)
Progress Note  Patient Name: Caroline [name] Date of Encounter: 12/13/2017  Primary Cardiologist: Ida Rogue, MD   Subjective   Shortness of breath improved but still not back to baseline. Urinated frequently yesterday (most of which was not recorded). No chest pain. S/p thoracentesis this morning.  Inpatient Medications    Scheduled Meds: . allopurinol  100 mg Oral Daily  . azithromycin  500 mg Oral Daily  . diltiazem  120 mg Oral Daily  . docusate sodium  100 mg Oral BID  . feeding supplement (ENSURE ENLIVE)  237 mL Oral BID BM  . furosemide  20 mg Intravenous Q12H  . levothyroxine  112 mcg Oral QAC breakfast  . lisinopril  40 mg Oral Daily  . metoprolol tartrate  50 mg Oral BID  . multivitamin with minerals  1 tablet Oral Daily  . simvastatin  20 mg Oral Daily   Continuous Infusions: . ceFEPime (MAXIPIME) IV     PRN Meds: acetaminophen **OR** acetaminophen, nitroGLYCERIN, ondansetron **OR** ondansetron (ZOFRAN) IV, oxyCODONE   Vital Signs    Vitals:   12/13/17 0513 12/13/17 0733 12/13/17 0948 12/13/17 0959  BP: 136/74 (!) 137/57 (!) 157/72 136/65  Pulse: 65 64 75 71  Resp: 18     Temp: 98.3 F (36.8 C) 97.9 F (36.6 C)    TempSrc: Oral Oral    SpO2: 99% 99% 98% 99%  Weight: 165 lb 3.2 oz (74.9 kg)     Height:        Intake/Output Summary (Last 24 hours) at 12/13/2017 1105 Last data filed at 12/13/2017 1035 Gross per 24 hour  Intake 240 ml  Output 1050 ml  Net -810 ml   Filed Weights   12/11/17 1108 12/11/17 1901 12/13/17 0513  Weight: 155 lb (70.3 kg) 165 lb 3.2 oz (74.9 kg) 165 lb 3.2 oz (74.9 kg)    Telemetry    NSR with isolated PACs - Personally Reviewed  ECG    No new tracing  Physical Exam   GEN: No acute distress.   Neck: JVP ~8 cm with +HJR Cardiac: RRR with 1/6 systolic and diastolic murmurs. Respiratory: Normal WOB. Diminished breath sounds at both bases. GI: Soft, nontender, non-distended  MS: Trace pretibial edema  bilaterally; No deformity. Neuro:  Nonfocal  Psych: Normal affect   Labs    Chemistry Recent Labs  Lab 12/11/17 1315 12/12/17 0314 12/13/17 0646  NA 135 135 136  K 4.2 3.6 3.6  CL 99* 99* 97*  CO2 25 26 30   GLUCOSE 136* 105* 97  BUN 30* 35* 42*  CREATININE 1.24* 1.48* 1.63*  CALCIUM 9.0 8.6* 8.7*  PROT 6.5  --   --   ALBUMIN 2.9*  --   --   AST 27  --   --   ALT 25  --   --   ALKPHOS 79  --   --   BILITOT 1.0  --   --   GFRNONAA 41* 33* 29*  GFRAA 47* 38* 34*  ANIONGAP 11 10 9      Hematology Recent Labs  Lab 12/07/17 0447 12/11/17 1315 12/12/17 0314  WBC 28.0* 30.2* 18.1*  RBC 3.42* 3.49* 3.17*  HGB 11.0* 11.3* 10.3*  HCT 33.2* 34.4* 31.0*  MCV 97.0 98.6 97.8  MCH 32.2 32.3 32.6  MCHC 33.2 32.8 33.3  RDW 15.1* 15.4* 15.6*  PLT 529* 470* 399    Cardiac Enzymes Recent Labs  Lab 12/11/17 1315 12/12/17 0904 12/12/17 1417 12/12/17 2041  TROPONINI 0.05* 0.03* 0.04* 0.03*   No results for input(s): TROPIPOC in the last 168 hours.   BNP Recent Labs  Lab 12/11/17 1315  BNP 1,399.0*     DDimer No results for input(s): DDIMER in the last 168 hours.   Radiology    Dg Chest 1 View  Result Date: 12/13/2017 CLINICAL DATA:  Post thoracentesis on the left. EXAM: CHEST 1 VIEW COMPARISON:  Radiographs 12/11/2017.  CT 12/11/2017. FINDINGS: 1003 hour. The left pleural effusion appears slightly smaller. There are persistent small bilateral pleural effusions. The heart is enlarged and there is aortic atherosclerosis. Bilateral perihilar airspace opacities have mildly improved. There is no pneumothorax. IMPRESSION: No pneumothorax following thoracentesis. Improving bilateral perihilar airspace opacities and left pleural effusion. Electronically Signed   By: Richardean Sale M.D.   On: 12/13/2017 10:19   Dg Chest 2 View  Result Date: 12/11/2017 CLINICAL DATA:  Shortness of breath EXAM: CHEST  2 VIEW COMPARISON:  CTA chest dated 12/05/2017 FINDINGS: Bilateral upper  lobe/ perihilar opacities, new/ increased from recent CTA chest, reflecting moderate interstitial edema. Small bilateral pleural effusions, left greater than right. No pneumothorax. Cardiomegaly. IMPRESSION: Cardiomegaly with moderate interstitial edema. Small bilateral pleural effusions, left greater than right. Electronically Signed   By: Julian Hy M.D.   On: 12/11/2017 11:53   Ct Angio Chest Pe W And/or Wo Contrast  Result Date: 12/11/2017 CLINICAL DATA:  Hypoxia. EXAM: CT ANGIOGRAPHY CHEST WITH CONTRAST TECHNIQUE: Multidetector CT imaging of the chest was performed using the standard protocol during bolus administration of intravenous contrast. Multiplanar CT image reconstructions and MIPs were obtained to evaluate the vascular anatomy. CONTRAST:  67mL ISOVUE-370 IOPAMIDOL (ISOVUE-370) INJECTION 76% COMPARISON:  CT scan of December 05, 2017. FINDINGS: Cardiovascular: There is no evidence of large central pulmonary embolus. However, due to persistent respiratory motion artifact, smaller peripheral emboli in the lower lobe branches cannot be excluded on the basis of this exam. No pericardial effusion is noted. Mediastinum/Nodes: Stable 12 mm precarinal lymph node is noted. No other significant adenopathy is noted. Thyroid gland and esophagus are unremarkable. Lungs/Pleura: No pneumothorax is noted. Large bilateral pleural effusions are noted with adjacent subsegmental atelectasis. Interval development of large airspace opacities noted in both upper lobes most consistent with pneumonia. Smaller airspace opacities are noted in both lower lobes. Upper Abdomen: Cholelithiasis is noted. Musculoskeletal: No chest wall abnormality. No acute or significant osseous findings. Review of the MIP images confirms the above findings. IMPRESSION: There is no evidence of large central pulmonary embolus, but smaller peripheral pulmonary emboli in the lower lobe branches cannot be excluded on the basis of this exam due  to persistent respiratory motion artifact. Large bilateral pleural effusions are noted with adjacent subsegmental atelectasis. Interval development of large airspace opacities in both upper lobes most consistent with pneumonia. Small airspace opacities are also noted in both lower lobes concerning for pneumonia. Aortic Atherosclerosis (ICD10-I70.0). Electronically Signed   By: Marijo Conception, M.D.   On: 12/11/2017 14:59   US Thoracentesis Asp Pleural Space W/img Guide  Result Date: 12/13/2017 INDICATION: Hypoxia and bilateral pneumonia.  Bilateral pleural effusions. EXAM: ULTRASOUND GUIDED LEFT THORACENTESIS MEDICATIONS: None. COMPLICATIONS: None immediate. PROCEDURE: An ultrasound guided thoracentesis was thoroughly discussed with the patient and questions answered. The benefits, risks, alternatives and complications were also discussed. The patient understands and wishes to proceed with the procedure. Written consent was obtained. Ultrasound was performed to localize and mark an adequate pocket of fluid in the left chest. The area  was then prepped and draped in the normal sterile fashion. 1% Lidocaine was used for local anesthesia. Under ultrasound guidance a 6 Fr Safe-T-Centesis catheter was introduced. Thoracentesis was performed. The catheter was removed and a dressing applied. FINDINGS: A total of approximately 350 mL of yellow fluid was removed. Samples were sent to the laboratory as requested by the clinical team. IMPRESSION: Successful ultrasound guided left thoracentesis yielding 350 mL of pleural fluid. Electronically Signed   By: Markus Daft M.D.   On: 12/13/2017 10:22    Cardiac Studies   TTE (11/29/17): - Left ventricle: The cavity size was normal. There was moderate   concentric hypertrophy. Systolic function was normal. The   estimated ejection fraction was in the range of 55% to 60%. Wall   motion was normal; there were no regional wall motion   abnormalities. Features are consistent  with a pseudonormal left   ventricular filling pattern, with concomitant abnormal relaxation   and increased filling pressure (grade 2 diastolic dysfunction). - Mitral valve: Calcified annulus. Mildly thickened, severely   calcified leaflets . The findings are consistent with moderate   stenosis. There was mild regurgitation. Mean gradient (D): 10 mm   Hg. Valve area by pressure half-time: 1.91 cm^2. - Left atrium: The atrium was moderately dilated. - Pulmonary arteries: Systolic pressure was moderately increased.   PA peak pressure: 50 mm Hg (S).   Patient Profile     76 y.o. female with a hx of chronic diastolic CHF, pulmonary hypertension, PAF on Eliquis, nonobstructive CAD by LHC in 2005, moderate mitral stenosis, prior TIA/CVA, CKD stage III-IV, anemia of chronic disease, bilateral carotid artery disease, OA s/p recent LTKA, HTN, and HLD, admitted with worsening shortness of breath due to a combination of acute on chronic diastolic heart failure and multifocal pneumonia.   Assessment & Plan    Acute respiratory distress with hypoxia Symptomatically improved, though still on supplemental oxygen and not back to baseline. Status post thoracentesis this morning.  Continue diuresis and antibiotics.  Wean O2, as tolerated.  Acute on chronic diastolic heart failure JVP still elevated but edema stable. I&O's not well-recorded. Weight stable compared with yesterday afternoon. However, patient reports good urine output and symptomatic improvement. BUN and creatinine have bumped slightly since yesterday.  Hold furosemide this afternoon, given rising BUN/creatinine and thoracentesis earlier today.  Consider contralateral thoracentesis if shortness of breath/hypoxia persist.  Mitral stenosis Moderate with marked mitral annular calcification. HR adequately controlled.  Continue metoprolol and diltiazem; target heart rate 55-60 bpm.  Further workup as an outpatient, including TEE +/-  right and left heart cath.  Paroxysmal atrial fibrillation Patient remains in sinus rhythm. Apixaban on for thoracentesis today.  Continue diltiazem and metoprolol.  Restart anticoagulation when no further invasive procedures are anticipated, given CHADSVASC score of 8.  CKD stage III-IV Slight interval decline in GFR.  Hold diuretics this afternoon and reassess renal function and volume status tomorrow.  Avoid nephrotoxic drugs.  Demand ischemia Minimally elevated troponin on admission likely due to supply-demand mismatch. Patient without angina.  No further workup at this time.  For questions or updates, please contact [place] Please consult [email or web address] for contact info under [facility]    Signed, Nelva Bush, MD  12/13/2017, 11:05 AM

## 2017-12-13 NOTE — Progress Notes (Signed)
[facility] [place] NAME: Caroline [name]    MR#:  [id number]  DATE OF BIRTH:  [birth year]  SUBJECTIVE:  CHIEF COMPLAINT:   Chief Complaint  Patient presents with  . Shortness of Breath   Patient continues to be on oxygen.  Has shortness of breath.  Complains of orthopnea.   REVIEW OF SYSTEMS:  Review of Systems  Constitutional: Positive for malaise/fatigue. Negative for chills and fever.  HENT: Negative for congestion, ear discharge, hearing loss and nosebleeds.   Eyes: Negative for blurred vision and double vision.  Respiratory: Positive for shortness of breath. Negative for cough and wheezing.   Cardiovascular: Negative for chest pain, palpitations and leg swelling.  Gastrointestinal: Negative for abdominal pain, constipation, diarrhea, nausea and vomiting.  Genitourinary: Negative for dysuria.  Musculoskeletal: Positive for joint pain and myalgias.  Neurological: Negative for dizziness, focal weakness, seizures and headaches.  Psychiatric/Behavioral: Negative for depression.    DRUG ALLERGIES:  No Known Allergies  VITALS:  Blood pressure 136/65, pulse 71, temperature 97.9 F (36.6 C), temperature source Oral, resp. rate 18, height 5' 2.5" (1.588 m), weight 74.9 kg (165 lb 3.2 oz), SpO2 99 %.  PHYSICAL EXAMINATION:  Physical Exam  GENERAL:  76 y.o.-year-old patient lying in the bed with no acute distress.  EYES: Pupils equal, round, reactive to light and accommodation. No scleral icterus. Extraocular muscles intact.  HEENT: Head atraumatic, normocephalic. Oropharynx and nasopharynx clear.  NECK:  Supple, no jugular venous distention. No thyroid enlargement, no tenderness.  LUNGS: Decreased breath sounds at the bases. CARDIOVASCULAR: S1, S2 normal. No murmurs, rubs, or gallops.  ABDOMEN: Soft, nontender, nondistended. Bowel sounds present. No organomegaly or mass.  EXTREMITIES: No pedal edema, cyanosis, or clubbing. Left knee  in brace NEUROLOGIC: Cranial nerves II through XII are intact. Muscle strength 5/5 in all extremities. Sensation intact. Gait not checked.  PSYCHIATRIC: The patient is alert and oriented x 3. SKIN: No obvious rash, lesion, or ulcer.    LABORATORY PANEL:   CBC Recent Labs  Lab 12/12/17 0314  WBC 18.1*  HGB 10.3*  HCT 31.0*  PLT 399   ------------------------------------------------------------------------------------------------------------------  Chemistries  Recent Labs  Lab 12/11/17 1315  12/13/17 0646  NA 135   < > 136  K 4.2   < > 3.6  CL 99*   < > 97*  CO2 25   < > 30  GLUCOSE 136*   < > 97  BUN 30*   < > 42*  CREATININE 1.24*   < > 1.63*  CALCIUM 9.0   < > 8.7*  AST 27  --   --   ALT 25  --   --   ALKPHOS 79  --   --   BILITOT 1.0  --   --    < > = values in this interval not displayed.   ------------------------------------------------------------------------------------------------------------------  Cardiac Enzymes Recent Labs  Lab 12/12/17 2041  TROPONINI 0.03*   ------------------------------------------------------------------------------------------------------------------  RADIOLOGY:  Dg Chest 1 View  Result Date: 12/13/2017 CLINICAL DATA:  Post thoracentesis on the left. EXAM: CHEST 1 VIEW COMPARISON:  Radiographs 12/11/2017.  CT 12/11/2017. FINDINGS: 1003 hour. The left pleural effusion appears slightly smaller. There are persistent small bilateral pleural effusions. The heart is enlarged and there is aortic atherosclerosis. Bilateral perihilar airspace opacities have mildly improved. There is no pneumothorax. IMPRESSION: No pneumothorax following thoracentesis. Improving bilateral perihilar airspace opacities and left pleural effusion. Electronically Signed   By: Gwyndolyn Saxon  Lin Landsman M.D.   On: 12/13/2017 10:19   Dg Chest 2 View  Result Date: 12/11/2017 CLINICAL DATA:  Shortness of breath EXAM: CHEST  2 VIEW COMPARISON:  CTA chest dated 12/05/2017  FINDINGS: Bilateral upper lobe/ perihilar opacities, new/ increased from recent CTA chest, reflecting moderate interstitial edema. Small bilateral pleural effusions, left greater than right. No pneumothorax. Cardiomegaly. IMPRESSION: Cardiomegaly with moderate interstitial edema. Small bilateral pleural effusions, left greater than right. Electronically Signed   By: Julian Hy M.D.   On: 12/11/2017 11:53   Ct Angio Chest Pe W And/or Wo Contrast  Result Date: 12/11/2017 CLINICAL DATA:  Hypoxia. EXAM: CT ANGIOGRAPHY CHEST WITH CONTRAST TECHNIQUE: Multidetector CT imaging of the chest was performed using the standard protocol during bolus administration of intravenous contrast. Multiplanar CT image reconstructions and MIPs were obtained to evaluate the vascular anatomy. CONTRAST:  70mL ISOVUE-370 IOPAMIDOL (ISOVUE-370) INJECTION 76% COMPARISON:  CT scan of December 05, 2017. FINDINGS: Cardiovascular: There is no evidence of large central pulmonary embolus. However, due to persistent respiratory motion artifact, smaller peripheral emboli in the lower lobe branches cannot be excluded on the basis of this exam. No pericardial effusion is noted. Mediastinum/Nodes: Stable 12 mm precarinal lymph node is noted. No other significant adenopathy is noted. Thyroid gland and esophagus are unremarkable. Lungs/Pleura: No pneumothorax is noted. Large bilateral pleural effusions are noted with adjacent subsegmental atelectasis. Interval development of large airspace opacities noted in both upper lobes most consistent with pneumonia. Smaller airspace opacities are noted in both lower lobes. Upper Abdomen: Cholelithiasis is noted. Musculoskeletal: No chest wall abnormality. No acute or significant osseous findings. Review of the MIP images confirms the above findings. IMPRESSION: There is no evidence of large central pulmonary embolus, but smaller peripheral pulmonary emboli in the lower lobe branches cannot be excluded on  the basis of this exam due to persistent respiratory motion artifact. Large bilateral pleural effusions are noted with adjacent subsegmental atelectasis. Interval development of large airspace opacities in both upper lobes most consistent with pneumonia. Small airspace opacities are also noted in both lower lobes concerning for pneumonia. Aortic Atherosclerosis (ICD10-I70.0). Electronically Signed   By: Marijo Conception, M.D.   On: 12/11/2017 14:59   US Thoracentesis Asp Pleural Space W/img Guide  Result Date: 12/13/2017 INDICATION: Hypoxia and bilateral pneumonia.  Bilateral pleural effusions. EXAM: ULTRASOUND GUIDED LEFT THORACENTESIS MEDICATIONS: None. COMPLICATIONS: None immediate. PROCEDURE: An ultrasound guided thoracentesis was thoroughly discussed with the patient and questions answered. The benefits, risks, alternatives and complications were also discussed. The patient understands and wishes to proceed with the procedure. Written consent was obtained. Ultrasound was performed to localize and mark an adequate pocket of fluid in the left chest. The area was then prepped and draped in the normal sterile fashion. 1% Lidocaine was used for local anesthesia. Under ultrasound guidance a 6 Fr Safe-T-Centesis catheter was introduced. Thoracentesis was performed. The catheter was removed and a dressing applied. FINDINGS: A total of approximately 350 mL of yellow fluid was removed. Samples were sent to the laboratory as requested by the clinical team. IMPRESSION: Successful ultrasound guided left thoracentesis yielding 350 mL of pleural fluid. Electronically Signed   By: Markus Daft M.D.   On: 12/13/2017 10:22    EKG:   Orders placed or performed during the hospital encounter of 12/11/17  . EKG 12-Lead  . EKG 12-Lead  . ED EKG within 10 minutes  . ED EKG within 10 minutes    ASSESSMENT AND PLAN:   76 year old  female with past medical history significant for CK D stage III, hypertension, diastolic  CHF, paroxysmal atrial fibrillation, history of TIA and arthritis who recently had a left total knee replacement surgery done last week presents with acute hypoxic respiratory failure.  *Acute hypoxic respiratory failure due to bilateral pneumonia and acute on chronic diastolic congestive heart failure Wean oxygen as tolerated.  Nebulizers as needed.  *Acute on chronic diastolic congestive heart failure.  Continue IV Lasix but will reduce dose due to worsening BUN/Cr  *Bilateral pneumonia.  On IV antibiotics.  Cultures pending.  Pro-calcitonin intermediate range.  Will repeat tomorrow.  *Bilateral pleural effusions likely due to congestive heart failure.  Ordered ultrasound thoracentesis on the left for diagnostic and therapeutic reason. Continue diuresis.  *AKI over CKD stage III due to diuresis Monitor I/os  *Recent left total knee replacement surgery-stable.pain medications as needed  * Paroxysmal atrial fibrillation-heart rate well controlled. On metoprolol.  Held Eliquis for thoracentesis. Will restart   All the records are reviewed and case discussed with Care Management/Social Workerr. Management plans discussed with the patient, family and they are in agreement.  CODE STATUS: Full Code  TOTAL TIME TAKING CARE OF THIS PATIENT: 35 minutes.   POSSIBLE D/C IN 2-3 DAYS, DEPENDING ON CLINICAL CONDITION.  Leia Alf Demere Dotzler M.D on 12/13/2017 at 10:51 AM  Between 7am to 6pm - Pager - [phone]  After 6pm go to [email or web address] - password [email or web address]  Office  [phone]  CC: Primary care physician; Remi Haggard, FNP

## 2017-12-13 NOTE — Progress Notes (Signed)
[facility] PROGRESS NOTE Date of Admission:  12/11/2017     ID: Caroline [name] is a 76 y.o. female with PNA, leukocytosis. Active Problems:   CHF exacerbation (HCC)   Subjective: No fevers, had thoracentesis today. [place] 1/2 CNS.  ROS  Eleven systems are reviewed and negative except per hpi  Medications:  Antibiotics Given (last 72 hours)    Date/Time Action Medication Dose Rate   12/11/17 1406 New Bag/Given   levofloxacin (LEVAQUIN) IVPB 750 mg 750 mg 100 mL/hr   12/11/17 2139 New Bag/Given   ceFEPIme (MAXIPIME) 2 g in dextrose 5 % 50 mL IVPB 2 g 100 mL/hr   12/12/17 0913 New Bag/Given   ceFEPIme (MAXIPIME) 2 g in dextrose 5 % 50 mL IVPB 2 g 100 mL/hr   12/12/17 1543 Given   azithromycin (ZITHROMAX) tablet 500 mg 500 mg    12/12/17 2058 New Bag/Given   ceFEPIme (MAXIPIME) 2 g in dextrose 5 % 50 mL IVPB 2 g 100 mL/hr   12/13/17 1205 Given   azithromycin (ZITHROMAX) tablet 500 mg 500 mg      . allopurinol  100 mg Oral Daily  . azithromycin  500 mg Oral Daily  . diltiazem  120 mg Oral Daily  . docusate sodium  100 mg Oral BID  . feeding supplement (ENSURE ENLIVE)  237 mL Oral BID BM  . furosemide  20 mg Intravenous Q12H  . levothyroxine  112 mcg Oral QAC breakfast  . lisinopril  40 mg Oral Daily  . metoprolol tartrate  50 mg Oral BID  . multivitamin with minerals  1 tablet Oral Daily  . simvastatin  20 mg Oral Daily    Objective: Vital signs in last 24 hours: Temp:  [97.4 F (36.3 C)-98.3 F (36.8 C)] 97.9 F (36.6 C) (12/19 0733) Pulse Rate:  [64-90] 71 (12/19 0959) Resp:  [18] 18 (12/19 0513) BP: (113-157)/(50-89) 136/65 (12/19 0959) SpO2:  [96 %-99 %] 99 % (12/19 0959) Weight:  [74.9 kg (165 lb 3.2 oz)] 74.9 kg (165 lb 3.2 oz) (12/19 0513) Constitutional:  oriented to person, place, and time. appears well-developed and well-nourished. No distress.  HENT: [place]/AT, PERRLA, no scleral icterus Mouth/Throat: Oropharynx is clear and moist. No  oropharyngeal exudate.  Cardiovascular: Normal rate, regular rhythm and normal heart sounds. Exam reveals no gallop and no friction rub.  No murmur heard.  Pulmonary/Chest: poor air movement and dec bs bil bases Neck = supple, no nuchal rigidity Abdominal: Soft. Bowel sounds are normal.  exhibits no distension. There is no tenderness.  Lymphadenopathy: no cervical adenopathy. No axillary adenopathy Neurological: alert and oriented to person, place, and time.  Skin: l knee site covered with honeycomb dressing, appears intact and nontender Psychiatric: a normal mood and affect.  behavior is normal.   Lab Results Recent Labs    12/11/17 1315 12/12/17 0314 12/13/17 0646  WBC 30.2* 18.1*  --   HGB 11.3* 10.3*  --   HCT 34.4* 31.0*  --   NA 135 135 136  K 4.2 3.6 3.6  CL 99* 99* 97*  CO2 25 26 30   BUN 30* 35* 42*  CREATININE 1.24* 1.48* 1.63*    Microbiology: Results for orders placed or performed during the hospital encounter of 12/11/17  Blood culture (routine x 2)     Status: None (Preliminary result)   Collection Time: 12/11/17  1:54 PM  Result Value Ref Range Status   Specimen Description BLOOD RIGHT ASSIST CONTROL  Final  Special Requests   Final    BOTTLES DRAWN AEROBIC AND ANAEROBIC Blood Culture adequate volume   Culture NO GROWTH 2 DAYS  Final   Report Status PENDING  Incomplete  Blood culture (routine x 2)     Status: None (Preliminary result)   Collection Time: 12/11/17  1:54 PM  Result Value Ref Range Status   Specimen Description BLOOD LEFT ASSIST CONTROL  Final   Special Requests   Final    BOTTLES DRAWN AEROBIC AND ANAEROBIC Blood Culture adequate volume   Culture  Setup Time   Final    GRAM POSITIVE COCCI AEROBIC BOTTLE ONLY CRITICAL RESULT CALLED TO, READ BACK BY AND VERIFIED WITH: JASON ROBBINS @ [id number] ON 12/12/2017 BY CAF    Culture   Final    GRAM POSITIVE COCCI TOO YOUNG TO READ Performed at [facility], [place] [address], [place], [place]  [address]    Report Status PENDING  Incomplete  Urine culture     Status: Abnormal   Collection Time: 12/11/17  1:54 PM  Result Value Ref Range Status   Specimen Description URINE, CLEAN CATCH  Final   Special Requests NONE  Final   Culture MULTIPLE SPECIES PRESENT, SUGGEST RECOLLECTION (A)  Final   Report Status 12/12/2017 FINAL  Final  Blood Culture ID Panel (Reflexed)     Status: Abnormal   Collection Time: 12/11/17  1:54 PM  Result Value Ref Range Status   Enterococcus species NOT DETECTED NOT DETECTED Final   Listeria monocytogenes NOT DETECTED NOT DETECTED Final   Staphylococcus species DETECTED (A) NOT DETECTED Final    Comment: Methicillin (oxacillin) susceptible coagulase negative staphylococcus. Possible blood culture contaminant (unless isolated from more than one blood culture draw or clinical case suggests pathogenicity). No antibiotic treatment is indicated for blood  culture contaminants. CRITICAL RESULT CALLED TO, READ BACK BY AND VERIFIED WITH: JASON ROBBINS @ [id number] ON 12/12/2017 BY CAF    Staphylococcus aureus NOT DETECTED NOT DETECTED Final   Methicillin resistance NOT DETECTED NOT DETECTED Final   Streptococcus species NOT DETECTED NOT DETECTED Final   Streptococcus agalactiae NOT DETECTED NOT DETECTED Final   Streptococcus pneumoniae NOT DETECTED NOT DETECTED Final   Streptococcus pyogenes NOT DETECTED NOT DETECTED Final   Acinetobacter baumannii NOT DETECTED NOT DETECTED Final   Enterobacteriaceae species NOT DETECTED NOT DETECTED Final   Enterobacter cloacae complex NOT DETECTED NOT DETECTED Final   Escherichia coli NOT DETECTED NOT DETECTED Final   Klebsiella oxytoca NOT DETECTED NOT DETECTED Final   Klebsiella pneumoniae NOT DETECTED NOT DETECTED Final   Proteus species NOT DETECTED NOT DETECTED Final   Serratia marcescens NOT DETECTED NOT DETECTED Final   Haemophilus influenzae NOT DETECTED NOT DETECTED Final   Neisseria meningitidis NOT DETECTED NOT DETECTED  Final   Pseudomonas aeruginosa NOT DETECTED NOT DETECTED Final   Candida albicans NOT DETECTED NOT DETECTED Final   Candida glabrata NOT DETECTED NOT DETECTED Final   Candida krusei NOT DETECTED NOT DETECTED Final   Candida parapsilosis NOT DETECTED NOT DETECTED Final   Candida tropicalis NOT DETECTED NOT DETECTED Final    Studies/Results: Dg Chest 1 View  Result Date: 12/13/2017 CLINICAL DATA:  Post thoracentesis on the left. EXAM: CHEST 1 VIEW COMPARISON:  Radiographs 12/11/2017.  CT 12/11/2017. FINDINGS: 1003 hour. The left pleural effusion appears slightly smaller. There are persistent small bilateral pleural effusions. The heart is enlarged and there is aortic atherosclerosis. Bilateral perihilar airspace opacities have mildly improved. There is no  pneumothorax. IMPRESSION: No pneumothorax following thoracentesis. Improving bilateral perihilar airspace opacities and left pleural effusion. Electronically Signed   By: Richardean Sale M.D.   On: 12/13/2017 10:19   US Thoracentesis Asp Pleural Space W/img Guide  Result Date: 12/13/2017 INDICATION: Hypoxia and bilateral pneumonia.  Bilateral pleural effusions. EXAM: ULTRASOUND GUIDED LEFT THORACENTESIS MEDICATIONS: None. COMPLICATIONS: None immediate. PROCEDURE: An ultrasound guided thoracentesis was thoroughly discussed with the patient and questions answered. The benefits, risks, alternatives and complications were also discussed. The patient understands and wishes to proceed with the procedure. Written consent was obtained. Ultrasound was performed to localize and mark an adequate pocket of fluid in the left chest. The area was then prepped and draped in the normal sterile fashion. 1% Lidocaine was used for local anesthesia. Under ultrasound guidance a 6 Fr Safe-T-Centesis catheter was introduced. Thoracentesis was performed. The catheter was removed and a dressing applied. FINDINGS: A total of approximately 350 mL of yellow fluid was removed.  Samples were sent to the laboratory as requested by the clinical team. IMPRESSION: Successful ultrasound guided left thoracentesis yielding 350 mL of pleural fluid. Electronically Signed   By: Markus Daft M.D.   On: 12/13/2017 10:22    Assessment/Plan: GENISE STRACK is a 76 y.o. female admitted with hx mod Mitral stenosis, A fib admitted with SOB,  elevated wbc after recent admission for CHF vs PNA.  At last admission her Procalc was nml and no fevers so after a few days of abx was dced off abx. She prior had  recent TKR surgery. Now again with SOB and elevated wbc. She had repeat Ct done to r/o Pe and it showed worsening multifocal fluid vs infiltrate as well as bile pleural effusions. BNP was elevated, Procacl nml at last visit but slightly elevated today.   Reviewing old records she has a chronic elevation of wbc up to 16-18 range. WBc remained elevated at 30 on admit but now down to 18. She reports feeling better, denies cough or fever. Still unclear if PNA vs CHF with her mitral stenosis likely the main issue. However will cover for PNA and check legionella ag.  12/19 - s/p throacentesis. No fevers,  bcx 1/2 CNS - likely contaminant.  Recommendations Continue cefepime and start azithromycin. Check urine leg ag. Continue management of CHF   Thank you very much for the consult. Will follow with you.  Leonel Ramsay   12/13/2017, 3:48 PM

## 2017-12-13 NOTE — Progress Notes (Signed)
Renal dosing adjustment for cefepime per protocol Serum creatinine: 1.63 mg/dL (H) 12/13/17 0646 Estimated creatinine clearance: 27.7 mL/min (A)   Anti-infectives (From admission, onward)   Start     Dose/Rate Route Frequency Ordered Stop   12/13/17 2058  ceFEPIme (MAXIPIME) 2 g in dextrose 5 % 50 mL IVPB     2 g 100 mL/hr over 30 Minutes Intravenous Every 24 hours 12/13/17 0935     12/12/17 1500  azithromycin (ZITHROMAX) tablet 500 mg     500 mg Oral Daily 12/12/17 1436 12/15/17 0959   12/11/17 2200  ceFEPIme (MAXIPIME) 2 g in dextrose 5 % 50 mL IVPB  Status:  Discontinued     2 g 100 mL/hr over 30 Minutes Intravenous Every 12 hours 12/11/17 1914 12/13/17 0935   12/11/17 1345  levofloxacin (LEVAQUIN) IVPB 750 mg     750 mg 100 mL/hr over 90 Minutes Intravenous  Once 12/11/17 1343 12/11/17 [place] Amoni Morales, PharmD, MBA, [facility]

## 2017-12-14 ENCOUNTER — Encounter: Payer: Self-pay | Admitting: *Deleted

## 2017-12-14 LAB — BASIC METABOLIC PANEL
ANION GAP: 9 (ref 5–15)
BUN: 40 mg/dL — ABNORMAL HIGH (ref 6–20)
CALCIUM: 8.6 mg/dL — AB (ref 8.9–10.3)
CO2: 29 mmol/L (ref 22–32)
Chloride: 95 mmol/L — ABNORMAL LOW (ref 101–111)
Creatinine, Ser: 1.35 mg/dL — ABNORMAL HIGH (ref 0.44–1.00)
GFR calc non Af Amer: 37 mL/min — ABNORMAL LOW (ref >60)
GFR, EST AFRICAN AMERICAN: 43 mL/min — AB (ref >60)
GLUCOSE: 104 mg/dL — AB (ref 65–99)
POTASSIUM: 3.3 mmol/L — AB (ref 3.5–5.1)
Sodium: 133 mmol/L — ABNORMAL LOW (ref 135–145)

## 2017-12-14 LAB — CBC WITH DIFFERENTIAL/PLATELET
BASOS ABS: 0.1 10*3/uL (ref 0–0.1)
BASOS PCT: 1 %
Eosinophils Absolute: 0.3 10*3/uL (ref 0–0.7)
Eosinophils Relative: 2 %
HEMATOCRIT: 30.3 % — AB (ref 35.0–47.0)
HEMOGLOBIN: 9.9 g/dL — AB (ref 12.0–16.0)
LYMPHS PCT: 16 %
Lymphs Abs: 2.2 10*3/uL (ref 1.0–3.6)
MCH: 31.6 pg (ref 26.0–34.0)
MCHC: 32.7 g/dL (ref 32.0–36.0)
MCV: 96.8 fL (ref 80.0–100.0)
MONO ABS: 1.8 10*3/uL — AB (ref 0.2–0.9)
MONOS PCT: 14 %
NEUTROS ABS: 9 10*3/uL — AB (ref 1.4–6.5)
NEUTROS PCT: 67 %
Platelets: 399 10*3/uL (ref 150–440)
RBC: 3.13 MIL/uL — ABNORMAL LOW (ref 3.80–5.20)
RDW: 15.5 % — AB (ref 11.5–14.5)
WBC: 13.5 10*3/uL — ABNORMAL HIGH (ref 3.6–11.0)

## 2017-12-14 LAB — PROCALCITONIN: Procalcitonin: 0.13 ng/mL

## 2017-12-14 LAB — LEGIONELLA PNEUMOPHILA SEROGP 1 UR AG: L. pneumophila Serogp 1 Ur Ag: NEGATIVE

## 2017-12-14 LAB — CYTOLOGY - NON PAP

## 2017-12-14 MED ORDER — APIXABAN 5 MG PO TABS
5.0000 mg | ORAL_TABLET | Freq: Two times a day (BID) | ORAL | Status: DC
  Administered 2017-12-14 – 2017-12-15 (×3): 5 mg via ORAL
  Filled 2017-12-14 (×3): qty 1

## 2017-12-14 MED ORDER — BISACODYL 10 MG RE SUPP: 10.0000 mg | Freq: Every day | RECTAL | Status: DC | PRN

## 2017-12-14 MED ORDER — DEXTROSE 5 % IV SOLN
2.0000 g | Freq: Two times a day (BID) | INTRAVENOUS | Status: DC
  Administered 2017-12-14 – 2017-12-15 (×3): 2 g via INTRAVENOUS
  Filled 2017-12-14 (×4): qty 2

## 2017-12-14 MED ORDER — POTASSIUM CHLORIDE CRYS ER 20 MEQ PO TBCR
40.0000 meq | EXTENDED_RELEASE_TABLET | ORAL | Status: AC
  Administered 2017-12-14 (×2): 40 meq via ORAL
  Filled 2017-12-14 (×2): qty 2

## 2017-12-14 MED ORDER — FUROSEMIDE 40 MG PO TABS
40.0000 mg | ORAL_TABLET | Freq: Every day | ORAL | Status: DC
  Administered 2017-12-14 – 2017-12-15 (×2): 40 mg via ORAL
  Filled 2017-12-14 (×2): qty 1

## 2017-12-14 MED ORDER — BISACODYL 5 MG PO TBEC
10.0000 mg | DELAYED_RELEASE_TABLET | Freq: Every day | ORAL | Status: DC
  Filled 2017-12-14: qty 2

## 2017-12-14 NOTE — Progress Notes (Signed)
[name] INFECTIOUS DISE[facility] PROGRESS NOTE Date of Admission:  12/11/2017     ID: Caroline [name] is a 76 y.o. female with PNA, leukocytosis. Active Problems:   CHF exacerbation (HCC)   Subjective: No fevers, had thoracentesis yesterday. Rhodes 1/2 CNS.  ROS  Eleven systems are reviewed and negative except per hpi  Medications:  Antibiotics Given (last 72 hours)    Date/Time Action Medication Dose Rate   12/11/17 1406 New Bag/Given   levofloxacin (LEVAQUIN) IVPB 750 mg 750 mg 100 mL/hr   12/11/17 2139 New Bag/Given   ceFEPIme (MAXIPIME) 2 g in dextrose 5 % 50 mL IVPB 2 g 100 mL/hr   12/12/17 0913 New Bag/Given   ceFEPIme (MAXIPIME) 2 g in dextrose 5 % 50 mL IVPB 2 g 100 mL/hr   12/12/17 1543 Given   azithromycin (ZITHROMAX) tablet 500 mg 500 mg    12/12/17 2058 New Bag/Given   ceFEPIme (MAXIPIME) 2 g in dextrose 5 % 50 mL IVPB 2 g 100 mL/hr   12/13/17 1205 Given   azithromycin (ZITHROMAX) tablet 500 mg 500 mg    12/13/17 2108 New Bag/Given   ceFEPIme (MAXIPIME) 2 g in dextrose 5 % 50 mL IVPB 2 g 100 mL/hr   12/14/17 1104 Given   azithromycin (ZITHROMAX) tablet 500 mg 500 mg    12/14/17 1109 New Bag/Given   ceFEPIme (MAXIPIME) 2 g in dextrose 5 % 50 mL IVPB 2 g 100 mL/hr     . allopurinol  100 mg Oral Daily  . apixaban  5 mg Oral BID  . diltiazem  120 mg Oral Daily  . docusate sodium  100 mg Oral BID  . feeding supplement (ENSURE ENLIVE)  237 mL Oral BID BM  . furosemide  40 mg Oral Daily  . levothyroxine  112 mcg Oral QAC breakfast  . lisinopril  40 mg Oral Daily  . metoprolol tartrate  50 mg Oral BID  . multivitamin with minerals  1 tablet Oral Daily  . potassium chloride  40 mEq Oral Q4H  . simvastatin  20 mg Oral Daily    Objective: Vital signs in last 24 hours: Temp:  [98.2 F (36.8 C)-98.7 F (37.1 C)] 98.4 F (36.9 C) (12/20 0737) Pulse Rate:  [59-66] 59 (12/20 0737) Resp:  [16] 16 (12/20 0428) BP: (126-136)/(50-59) 136/50 (12/20 0737) SpO2:  [97  %-100 %] 99 % (12/20 0737) Weight:  [74.9 kg (165 lb 1.6 oz)] 74.9 kg (165 lb 1.6 oz) (12/20 0428) Constitutional:  oriented to person, place, and time. appears well-developed and well-nourished. No distress.  HENT: [place]/AT, PERRLA, no scleral icterus Mouth/Throat: Oropharynx is clear and moist. No oropharyngeal exudate.  Cardiovascular: Normal rate, regular rhythm and normal heart sounds. Exam reveals no gallop and no friction rub.  No murmur heard.  Pulmonary/Chest: poor air movement and dec bs bil bases Neck = supple, no nuchal rigidity Abdominal: Soft. Bowel sounds are normal.  exhibits no distension. There is no tenderness.  Lymphadenopathy: no cervical adenopathy. No axillary adenopathy Neurological: alert and oriented to person, place, and time.  Skin: l knee site covered with honeycomb dressing, appears intact and nontender Psychiatric: a normal mood and affect.  behavior is normal.   Lab Results Recent Labs    12/12/17 0314 12/13/17 0646 12/14/17 0338  WBC 18.1*  --  13.5*  HGB 10.3*  --  9.9*  HCT 31.0*  --  30.3*  NA 135 136 133*  K 3.6 3.6 3.3*  CL 99* 97*  95*  CO2 26 30 29   BUN 35* 42* 40*  CREATININE 1.48* 1.63* 1.35*    Microbiology: Results for orders placed or performed during the hospital encounter of 12/11/17  Blood culture (routine x 2)     Status: None (Preliminary result)   Collection Time: 12/11/17  1:54 PM  Result Value Ref Range Status   Specimen Description BLOOD RIGHT ASSIST CONTROL  Final   Special Requests   Final    BOTTLES DRAWN AEROBIC AND ANAEROBIC Blood Culture adequate volume   Culture NO GROWTH 3 DAYS  Final   Report Status PENDING  Incomplete  Blood culture (routine x 2)     Status: None (Preliminary result)   Collection Time: 12/11/17  1:54 PM  Result Value Ref Range Status   Specimen Description BLOOD LEFT ASSIST CONTROL  Final   Special Requests   Final    BOTTLES DRAWN AEROBIC AND ANAEROBIC Blood Culture adequate volume   Culture   Setup Time   Final    GRAM POSITIVE COCCI AEROBIC BOTTLE ONLY CRITICAL RESULT CALLED TO, READ BACK BY AND VERIFIED WITH: JASON ROBBINS @ [id number] ON 12/12/2017 BY [facility]    Culture GRAM POSITIVE COCCI  Final   Report Status PENDING  Incomplete  Urine culture     Status: Abnormal   Collection Time: 12/11/17  1:54 PM  Result Value Ref Range Status   Specimen Description URINE, CLEAN CATCH  Final   Special Requests NONE  Final   Culture MULTIPLE SPECIES PRESENT, SUGGEST RECOLLECTION (A)  Final   Report Status 12/12/2017 FINAL  Final  Blood Culture ID Panel (Reflexed)     Status: Abnormal   Collection Time: 12/11/17  1:54 PM  Result Value Ref Range Status   Enterococcus species NOT DETECTED NOT DETECTED Final   Listeria monocytogenes NOT DETECTED NOT DETECTED Final   Staphylococcus species DETECTED (A) NOT DETECTED Final    Comment: Methicillin (oxacillin) susceptible coagulase negative staphylococcus. Possible blood culture contaminant (unless isolated from more than one blood culture draw or clinical case suggests pathogenicity). No antibiotic treatment is indicated for blood  culture contaminants. CRITICAL RESULT CALLED TO, READ BACK BY AND VERIFIED WITH: JASON ROBBINS @ 3016 ON 12/12/2017 BY [facility]    Staphylococcus aureus NOT DETECTED NOT DETECTED Final   Methicillin resistance NOT DETECTED NOT DETECTED Final   Streptococcus species NOT DETECTED NOT DETECTED Final   Streptococcus agalactiae NOT DETECTED NOT DETECTED Final   Streptococcus pneumoniae NOT DETECTED NOT DETECTED Final   Streptococcus pyogenes NOT DETECTED NOT DETECTED Final   Acinetobacter baumannii NOT DETECTED NOT DETECTED Final   Enterobacteriaceae species NOT DETECTED NOT DETECTED Final   Enterobacter cloacae complex NOT DETECTED NOT DETECTED Final   Escherichia coli NOT DETECTED NOT DETECTED Final   Klebsiella oxytoca NOT DETECTED NOT DETECTED Final   Klebsiella pneumoniae NOT DETECTED NOT DETECTED Final   Proteus  species NOT DETECTED NOT DETECTED Final   Serratia marcescens NOT DETECTED NOT DETECTED Final   Haemophilus influenzae NOT DETECTED NOT DETECTED Final   Neisseria meningitidis NOT DETECTED NOT DETECTED Final   Pseudomonas aeruginosa NOT DETECTED NOT DETECTED Final   Candida albicans NOT DETECTED NOT DETECTED Final   Candida glabrata NOT DETECTED NOT DETECTED Final   Candida krusei NOT DETECTED NOT DETECTED Final   Candida parapsilosis NOT DETECTED NOT DETECTED Final   Candida tropicalis NOT DETECTED NOT DETECTED Final  Body fluid culture     Status: None (Preliminary result)   Collection Time: 12/13/17  9:55 AM  Result Value Ref Range Status   Specimen Description PLEURAL  Final   Special Requests NONE  Final   Gram Stain   Final    RARE WBC PRESENT, PREDOMINANTLY PMN NO ORGANISMS SEEN    Culture   Final    NO GROWTH < 24 HOURS Performed at [facility] [address], [place], [place] [address]    Report Status PENDING  Incomplete    Studies/Results: Dg Chest 1 View  Result Date: 12/13/2017 CLINICAL DATA:  Post thoracentesis on the left. EXAM: CHEST 1 VIEW COMPARISON:  Radiographs 12/11/2017.  CT 12/11/2017. FINDINGS: 1003 hour. The left pleural effusion appears slightly smaller. There are persistent small bilateral pleural effusions. The heart is enlarged and there is aortic atherosclerosis. Bilateral perihilar airspace opacities have mildly improved. There is no pneumothorax. IMPRESSION: No pneumothorax following thoracentesis. Improving bilateral perihilar airspace opacities and left pleural effusion. Electronically Signed   By: Richardean Sale M.D.   On: 12/13/2017 10:19   US Thoracentesis Asp Pleural Space W/img Guide  Result Date: 12/13/2017 INDICATION: Hypoxia and bilateral pneumonia.  Bilateral pleural effusions. EXAM: ULTRASOUND GUIDED LEFT THORACENTESIS MEDICATIONS: None. COMPLICATIONS: None immediate. PROCEDURE: An ultrasound guided thoracentesis was thoroughly  discussed with the patient and questions answered. The benefits, risks, alternatives and complications were also discussed. The patient understands and wishes to proceed with the procedure. Written consent was obtained. Ultrasound was performed to localize and mark an adequate pocket of fluid in the left chest. The area was then prepped and draped in the normal sterile fashion. 1% Lidocaine was used for local anesthesia. Under ultrasound guidance a 6 Fr Safe-T-Centesis catheter was introduced. Thoracentesis was performed. The catheter was removed and a dressing applied. FINDINGS: A total of approximately 350 mL of yellow fluid was removed. Samples were sent to the laboratory as requested by the clinical team. IMPRESSION: Successful ultrasound guided left thoracentesis yielding 350 mL of pleural fluid. Electronically Signed   By: Markus Daft M.D.   On: 12/13/2017 10:22    Assessment/Plan: MACKENZY GRUMBINE is a 76 y.o. female admitted with hx mod Mitral stenosis, A fib admitted with SOB,  elevated wbc after recent admission for CHF vs PNA.  At last admission her Procalc was nml and no fevers so after a few days of abx was dced off abx. She prior had recent TKR surgery. Now again with SOB and elevated wbc. She had repeat Ct done to r/o Pe and it showed worsening multifocal fluid vs infiltrate as well as bil pleural effusions. BNP was elevated, Procacl nml at last visit but slightly elevated this admit.  Reviewing old records she has a chronic elevation of wbc up to 16-18 range. WBc remained elevated at 30 on admit but now down to 18-. She reports feeling better, denies cough or fever. Still unclear if PNA vs CHF with her mitral stenosis likely the main issue. However will cover for PNA and check legionella ag. 12/19 - s/p throacentesis. No fevers,  bcx 1/2 CNS - likely contaminant. 12.20 -  No fever, wbc down to 13.5. Pleural fluid with 258 wbc mainly lymphs, cx negative. Day 4 of cefepime, da3 of  azithrom  Recommendations Continue cefepime (day4) Dc azithromycin since s/p 3 days of 500 mg Await urine leg ag. Continue management of CHF  If stable for dc can send on oral cefpodoxime 200 BID to complete a 7 day total abx course - stop date 12/24.  Thank you very much for  the consult. Will follow with you.  Leonel Ramsay   12/14/2017, 11:44 AM

## 2017-12-14 NOTE — Evaluation (Signed)
Physical Therapy Evaluation Patient Details Name: Caroline [name] MRN: [id number] DOB: 11-Sep-1941 Today's Date: 12/14/2017   History of Present Illness  presented to ER secondary to progressive SOB, nausea, weakness; admitted with acute hypoxic respiratory failure secondary to CHF, multifocal PNA.  Currently requiring 2L supplemental O2 to maintain sats >88% with exertional activity.  Of note, patient with recent L TKR (11/28/17), WBAT; discharge home with subsequent readmission due to PNA and return discharge home 12/14.  Has been scheduled to receive HHPT each time, but not home long enough to initiate services.  Clinical Impression  Patient known to therapist from previous admission.  Appears much more awake, alert and coherent during this session.  L knee progressing well with strength at least 4/5, ROM approx 0-90 degrees; very minimal pain reported.  Demonstrates ability complete all bed mobility, sit/stand, basic transfers and gait (200') with RW, cga/close sup from therapist.  Good L knee control and stability this session; but continues with slow cadence, decreased dynamic balance reactions. Continues to require supplemental O2 at 2L to maintain sats with exertion; anticipate possible need for home O2 upon discharge.  RN informed/aware. Would benefit from skilled PT to address above deficits and promote optimal return to PLOF; Recommend transition to [facility] upon discharge from acute hospitalization.   SaO2 on room air while ambulating = 87% SaO2 on 2 liters of O2 while ambulating = 90%     Follow Up Recommendations Home health PT    Equipment Recommendations       Recommendations for Other Services       Precautions / Restrictions Precautions Precautions: Fall Restrictions Weight Bearing Restrictions: Yes LLE Weight Bearing: Weight bearing as tolerated      Mobility  Bed Mobility Overal bed mobility: Modified Independent       Supine to sit: Modified independent  (Device/Increase time)     General bed mobility comments: Increased time/effort  Transfers Overall transfer level: Needs assistance Equipment used: Rolling walker (2 wheeled) Transfers: Sit to/from Stand Sit to Stand: Min guard;Supervision         General transfer comment: cuing for hand placement with lift off and stand to sit  Ambulation/Gait Ambulation/Gait assistance: Min guard Ambulation Distance (Feet): 200 Feet Assistive device: Rolling walker (2 wheeled)   Gait velocity: 10' walk time, 15 seconds Gait velocity interpretation: <1.8 ft/sec, indicative of risk for recurrent falls General Gait Details: step through gait pattern with decreased step height/length bilat; min cuing for R LE stepping vs sliding across floor.  Decreased cadence and gait speed; increased time to negotiate turns/obstacles.  Stairs            Wheelchair Mobility    Modified Rankin (Stroke Patients Only)       Balance Overall balance assessment: Needs assistance Sitting-balance support: No upper extremity supported;Feet supported Sitting balance-Leahy Scale: Normal     Standing balance support: Bilateral upper extremity supported Standing balance-Leahy Scale: Fair                               Pertinent Vitals/Pain Pain Assessment: No/denies pain    Home Living Family/patient expects to be discharged to:: Private residence Living Arrangements: Spouse/significant other Available Help at Discharge: Family;Available 24 hours/day Type of Home: House Home Access: Stairs to enter Entrance Stairs-Rails: Left Entrance Stairs-Number of Steps: 3 Home Layout: One level Home Equipment: Walker - 2 wheels      Prior Function Level of Independence: Independent  Comments: At baseline, pt indep with ADL, IADL, household and community mobilization; + driving; no falls endorsed in past 12 months.  Has been using RW since L TKR and experiencing persistent weakness due  to acute medical issues post-op     Hand Dominance        Extremity/Trunk Assessment   Upper Extremity Assessment Upper Extremity Assessment: Overall WFL for tasks assessed    Lower Extremity Assessment Lower Extremity Assessment: (L LE grossly 4/5 with knee ROM approx 0-90 degrees; R LE 5/5)       Communication   Communication: HOH  Cognition Arousal/Alertness: Awake/alert Behavior During Therapy: WFL for tasks assessed/performed Overall Cognitive Status: Within Functional Limits for tasks assessed                                        General Comments      Exercises Other Exercises Other Exercises: Toilet transfer, ambulatory with RW, cga/close sup; sit/stand from [facility] wth RW, cga/close sup; static standing balance at sink for hand hygiene, close sup.  Min cuing for walker position/use/safety.  Limited functional reach evident, only 2-3" from immediate BOS prior to need for external stabilization.   Assessment/Plan    PT Assessment Patient needs continued PT services  PT Problem List Decreased strength;Decreased range of motion;Decreased activity tolerance;Decreased balance;Decreased mobility;Decreased coordination;Decreased cognition;Decreased knowledge of use of DME;Decreased safety awareness;Decreased knowledge of precautions;Pain;Decreased skin integrity       PT Treatment Interventions DME instruction;Stair training;Gait training;Functional mobility training;Therapeutic activities;Therapeutic exercise;Balance training;Patient/family education    PT Goals (Current goals can be found in the Care Plan section)  Acute Rehab PT Goals Patient Stated Goal: to get off this oxygen and go home PT Goal Formulation: With patient Time For Goal Achievement: 12/28/17 Potential to Achieve Goals: Good    Frequency 7X/week   Barriers to discharge        Co-evaluation               AM-PAC PT "6 Clicks" Daily Activity  Outcome Measure Difficulty  turning over in bed (including adjusting bedclothes, sheets and blankets)?: A Little Difficulty moving from lying on back to sitting on the side of the bed? : A Little Difficulty sitting down on and standing up from a chair with arms (e.g., wheelchair, bedside commode, etc,.)?: Unable Help needed moving to and from a bed to chair (including a wheelchair)?: A Little Help needed walking in hospital room?: A Little Help needed climbing 3-5 steps with a railing? : A Little 6 Click Score: 16    End of Session Equipment Utilized During Treatment: Gait belt Activity Tolerance: Patient tolerated treatment well Patient left: in chair;with call bell/phone within reach;with chair alarm set Nurse Communication: Mobility status PT Visit Diagnosis: Muscle weakness (generalized) (M62.81);Difficulty in walking, not elsewhere classified (R26.2)    Time: 1245-8099 PT Time Calculation (min) (ACUTE ONLY): 26 min   Charges:   PT Evaluation $PT Eval Low Complexity: 1 Low PT Treatments $Therapeutic Activity: 8-22 mins   PT G Codes:   PT G-Codes **NOT FOR INPATIENT CLASS** Functional Assessment Tool Used: AM-PAC 6 Clicks Basic Mobility Functional Limitation: Mobility: Walking and moving around Mobility: Walking and Moving Around Current Status (I3382): At least 20 percent but less than 40 percent impaired, limited or restricted Mobility: Walking and Moving Around Goal Status [phone]): At least 1 percent but less than 20 percent impaired, limited or restricted  Sharren Schnurr H. Owens Shark, PT, DPT, NCS 12/14/17, 11:10 AM [phone]

## 2017-12-14 NOTE — Progress Notes (Signed)
Talked to Dr. Jerelyn Charles about patient's BP of 105/51, patient has scheduled Metoprolol 50mg  P.O, order to hold tonight's dose. No other concern at the moment. RN will continue to monitor.

## 2017-12-14 NOTE — Progress Notes (Signed)
[facility] NAME: Caroline [name]    MR#:  [id number]  DATE OF BIRTH:  Feb 09, 1941  SUBJECTIVE:  CHIEF COMPLAINT:   Chief Complaint  Patient presents with  . Shortness of Breath   SOB better today. Dry cough Feels weak On 2 L o2  REVIEW OF SYSTEMS:  Review of Systems  Constitutional: Positive for malaise/fatigue. Negative for chills and fever.  HENT: Negative for congestion, ear discharge, hearing loss and nosebleeds.   Eyes: Negative for blurred vision and double vision.  Respiratory: Positive for shortness of breath. Negative for cough and wheezing.   Cardiovascular: Negative for chest pain, palpitations and leg swelling.  Gastrointestinal: Negative for abdominal pain, constipation, diarrhea, nausea and vomiting.  Genitourinary: Negative for dysuria.  Musculoskeletal: Positive for joint pain and myalgias.  Neurological: Negative for dizziness, focal weakness, seizures and headaches.  Psychiatric/Behavioral: Negative for depression.    DRUG ALLERGIES:  No Known Allergies  VITALS:  Blood pressure (!) 129/58, pulse (!) 59, temperature 98.4 F (36.9 C), temperature source Oral, resp. rate 16, height 5' 2.5" (1.588 m), weight 74.9 kg (165 lb 1.6 oz), SpO2 99 %.  PHYSICAL EXAMINATION:  Physical Exam  GENERAL:  76 y.o.-year-old patient lying in the bed with no acute distress.  EYES: Pupils equal, round, reactive to light and accommodation. No scleral icterus. Extraocular muscles intact.  HEENT: Head atraumatic, normocephalic. Oropharynx and nasopharynx clear.  NECK:  Supple, no jugular venous distention. No thyroid enlargement, no tenderness.  LUNGS: Decreased breath sounds at the bases. CARDIOVASCULAR: S1, S2 normal. No murmurs, rubs, or gallops.  ABDOMEN: Soft, nontender, nondistended. Bowel sounds present. No organomegaly or mass.  EXTREMITIES: No pedal edema, cyanosis, or clubbing. Left knee in brace NEUROLOGIC:  Cranial nerves II through XII are intact. Muscle strength 5/5 in all extremities. Sensation intact. Gait not checked.  PSYCHIATRIC: The patient is alert and oriented x 3. SKIN: No obvious rash, lesion, or ulcer.    LABORATORY PANEL:   CBC Recent Labs  Lab 12/14/17 0338  WBC 13.5*  HGB 9.9*  HCT 30.3*  PLT 399   ------------------------------------------------------------------------------------------------------------------  Chemistries  Recent Labs  Lab 12/11/17 1315  12/14/17 0338  NA 135   < > 133*  K 4.2   < > 3.3*  CL 99*   < > 95*  CO2 25   < > 29  GLUCOSE 136*   < > 104*  BUN 30*   < > 40*  CREATININE 1.24*   < > 1.35*  CALCIUM 9.0   < > 8.6*  AST 27  --   --   ALT 25  --   --   ALKPHOS 79  --   --   BILITOT 1.0  --   --    < > = values in this interval not displayed.   ------------------------------------------------------------------------------------------------------------------  Cardiac Enzymes Recent Labs  Lab 12/12/17 2041  TROPONINI 0.03*   ------------------------------------------------------------------------------------------------------------------  RADIOLOGY:  Dg Chest 1 View  Result Date: 12/13/2017 CLINICAL DATA:  Post thoracentesis on the left. EXAM: CHEST 1 VIEW COMPARISON:  Radiographs 12/11/2017.  CT 12/11/2017. FINDINGS: 1003 hour. The left pleural effusion appears slightly smaller. There are persistent small bilateral pleural effusions. The heart is enlarged and there is aortic atherosclerosis. Bilateral perihilar airspace opacities have mildly improved. There is no pneumothorax. IMPRESSION: No pneumothorax following thoracentesis. Improving bilateral perihilar airspace opacities and left pleural effusion. Electronically Signed   By: Caryl Comes.D.  On: 12/13/2017 10:19   US Thoracentesis Asp Pleural Space W/img Guide  Result Date: 12/13/2017 INDICATION: Hypoxia and bilateral pneumonia.  Bilateral pleural effusions. EXAM:  ULTRASOUND GUIDED LEFT THORACENTESIS MEDICATIONS: None. COMPLICATIONS: None immediate. PROCEDURE: An ultrasound guided thoracentesis was thoroughly discussed with the patient and questions answered. The benefits, risks, alternatives and complications were also discussed. The patient understands and wishes to proceed with the procedure. Written consent was obtained. Ultrasound was performed to localize and mark an adequate pocket of fluid in the left chest. The area was then prepped and draped in the normal sterile fashion. 1% Lidocaine was used for local anesthesia. Under ultrasound guidance a 6 Fr Safe-T-Centesis catheter was introduced. Thoracentesis was performed. The catheter was removed and a dressing applied. FINDINGS: A total of approximately 350 mL of yellow fluid was removed. Samples were sent to the laboratory as requested by the clinical team. IMPRESSION: Successful ultrasound guided left thoracentesis yielding 350 mL of pleural fluid. Electronically Signed   By: Markus Daft M.D.   On: 12/13/2017 10:22    EKG:   Orders placed or performed during the hospital encounter of 12/11/17  . EKG 12-Lead  . EKG 12-Lead  . ED EKG within 10 minutes  . ED EKG within 10 minutes    ASSESSMENT AND PLAN:   76 year old female with past medical history significant for CK D stage III, hypertension, diastolic CHF, paroxysmal atrial fibrillation, history of TIA and arthritis who recently had a left total knee replacement surgery done last week presents with acute hypoxic respiratory failure.  * Acute hypoxic respiratory failure due to bilateral pneumonia and acute on chronic diastolic congestive heart failure Wean oxygen as tolerated.  Nebulizers as needed.  * Acute on chronic diastolic congestive heart failure. Improving Change lasix to PO  * Bilateral pneumonia.  On IV antibiotics.  Cultures pending.  Switch to PO in 1-2 days  *Bilateral pleural effusions likely due to congestive heart failure.   Ordered ultrasound thoracentesis on the left for diagnostic and therapeutic reason. Continue diuresis.  * AKI over CKD stage III due to diuresis Monitor I/os  * Recent left total knee replacement surgery-stable.pain medications as needed  * Paroxysmal atrial fibrillation-heart rate well controlled. On metoprolol.  Restart Eliquis  All the records are reviewed and case discussed with Care Management/Social Worker Management plans discussed with the patient, family and they are in agreement.  CODE STATUS: Full Code  TOTAL TIME TAKING CARE OF THIS PATIENT: 35 minutes.   POSSIBLE D/C IN 2-3 DAYS, DEPENDING ON CLINICAL CONDITION.  Leia Alf Cynthie Garmon M.D on 12/14/2017 at 1:47 PM  Between 7am to 6pm - Pager - [phone]  After 6pm go to [email or web address] - password [email or web address] [facility]  Office  [phone]  CC: Primary care physician; Remi Haggard, FNP

## 2017-12-14 NOTE — Progress Notes (Signed)
SATURATION QUALIFICATIONS: (This note is used to comply with regulatory documentation for home oxygen)  Patient Saturations on Room Air at Rest = 96%  Patient Saturations on Room Air while Ambulating = 87%  Patient Saturations on 2 Liters of oxygen while Ambulating = 96%  Please briefly explain why patient needs home oxygen:

## 2017-12-14 NOTE — Progress Notes (Signed)
Renal dosing adjustment for cefepime per protocol, Changing back to cefepime 2gm iv q12h    Serum creatinine: 1.35 mg/dL (H) 12/14/17 0338 Estimated creatinine clearance: 33.4 mL/min (A)   Anti-infectives (From admission, onward)   Start     Dose/Rate Route Frequency Ordered Stop   12/13/17 2058  ceFEPIme (MAXIPIME) 2 g in dextrose 5 % 50 mL IVPB     2 g 100 mL/hr over 30 Minutes Intravenous Every 24 hours 12/13/17 0935     12/12/17 1500  azithromycin (ZITHROMAX) tablet 500 mg     500 mg Oral Daily 12/12/17 1436 12/15/17 0959   12/11/17 2200  ceFEPIme (MAXIPIME) 2 g in dextrose 5 % 50 mL IVPB  Status:  Discontinued     2 g 100 mL/hr over 30 Minutes Intravenous Every 12 hours 12/11/17 1914 12/13/17 0935   12/11/17 1345  levofloxacin (LEVAQUIN) IVPB 750 mg     750 mg 100 mL/hr over 90 Minutes Intravenous  Once 12/11/17 1343 12/11/17 Finger Raffaele Derise, PharmD, MBA, [facility]

## 2017-12-14 NOTE — Progress Notes (Signed)
Provided patient with "Living Better with Heart Failure" packet. Briefly reviewed definition of heart failure and signs and symptoms of an exacerbation. Reviewed importance of and reason behind checking weight daily in the AM, after using the bathroom, but before getting dressed. Discussed when to call the Dr= weight gain of >2lb overnight of 5lb in a week,  Discussed yellow zone= call MD: weight gain of >2lb overnight of 5lb in a week, increased swelling, increased SOB when lying down, chest discomfort, dizziness, increased fatigue Red Zone= call 911: struggle to breath, fainting or near fainting, significant chest pain Reviewed low sodium diet <2g/day-provided handout of recommended and not recommended foods  Fluid restriction <2L/day Reviewed how to read nutrition label Reviewed medication changes: Discussed Lasix, metoprolol, potassium, and lisinopril  Explained briefly why pt is on the medications (either make you feel better, live longer or keep you out of the hospital) and discussed monitoring and side effects  Discussed tobacco cessation: non-smoker Discussed exercise: as tolerated  Ulice Dash, PharmD Clinical Pharmacist

## 2017-12-14 NOTE — Care Management (Signed)
Patient admitted with respiratory failure due to CHF.  Patient lives at home with spouse.  PCP Threasa Alpha.  Patient open with [facility] home [facility] for RN and PT.  Sharyn Lull with [facility] notified of admission.  Patient requiring acute O2, qualifying sats have been documented 12/14/17.  RNCM following.

## 2017-12-14 NOTE — Progress Notes (Signed)
Progress Note  Patient Name: Caroline [name] Date of Encounter: 12/14/2017  Primary Cardiologist: Ida Rogue, MD  Subjective   Breathing much improved following L thoracentesis yesterday.  Still requiring O2 however.  Eager to go home.  Inpatient Medications    Scheduled Meds: . allopurinol  100 mg Oral Daily  . azithromycin  500 mg Oral Daily  . diltiazem  120 mg Oral Daily  . docusate sodium  100 mg Oral BID  . feeding supplement (ENSURE ENLIVE)  237 mL Oral BID BM  . furosemide  20 mg Intravenous Q12H  . levothyroxine  112 mcg Oral QAC breakfast  . lisinopril  40 mg Oral Daily  . metoprolol tartrate  50 mg Oral BID  . multivitamin with minerals  1 tablet Oral Daily  . potassium chloride  40 mEq Oral Q4H  . simvastatin  20 mg Oral Daily   Continuous Infusions: . ceFEPime (MAXIPIME) IV     PRN Meds: acetaminophen **OR** acetaminophen, nitroGLYCERIN, ondansetron **OR** ondansetron (ZOFRAN) IV, oxyCODONE   Vital Signs    Vitals:   12/13/17 1637 12/13/17 2044 12/14/17 0428 12/14/17 0737  BP: (!) 126/52 (!) 134/59 (!) 136/50 (!) 136/50  Pulse: 65 66 (!) 59 (!) 59  Resp:  16 16   Temp: 98.2 F (36.8 C) 98.3 F (36.8 C) 98.7 F (37.1 C) 98.4 F (36.9 C)  TempSrc: Oral Oral Oral Oral  SpO2: 100% 97% 100% 99%  Weight:   165 lb 1.6 oz (74.9 kg)   Height:        Intake/Output Summary (Last 24 hours) at 12/14/2017 1033 Last data filed at 12/14/2017 0949 Gross per 24 hour  Intake 1010 ml  Output 1450 ml  Net -440 ml   Filed Weights   12/11/17 1901 12/13/17 0513 12/14/17 0428  Weight: 165 lb 3.2 oz (74.9 kg) 165 lb 3.2 oz (74.9 kg) 165 lb 1.6 oz (74.9 kg)    Physical Exam   GEN: Well nourished, well developed, in no acute distress.  HEENT: Grossly normal.  Neck: Supple, no JVD, carotid bruits, or masses. Cardiac: RRR, 1/6 syst/diast murmur @ apex, no rubs, or gallops. No clubbing, cyanosis, edema.  Radials/DP/PT 2+ and equal bilaterally.  Respiratory:   Respirations regular and unlabored, clear to auscultation bilaterally. GI: Soft, nontender, nondistended, BS + x 4. MS: no deformity or atrophy. Skin: warm and dry, no rash. Neuro:  Strength and sensation are intact. Psych: AAOx3.  Normal affect.  Labs    Chemistry Recent Labs  Lab 12/11/17 1315 12/12/17 0314 12/13/17 0646 12/14/17 0338  NA 135 135 136 133*  K 4.2 3.6 3.6 3.3*  CL 99* 99* 97* 95*  CO2 25 26 30 29   GLUCOSE 136* 105* 97 104*  BUN 30* 35* 42* 40*  CREATININE 1.24* 1.48* 1.63* 1.35*  CALCIUM 9.0 8.6* 8.7* 8.6*  PROT 6.5  --   --   --   ALBUMIN 2.9*  --   --   --   AST 27  --   --   --   ALT 25  --   --   --   ALKPHOS 79  --   --   --   BILITOT 1.0  --   --   --   GFRNONAA 41* 33* 29* 37*  GFRAA 47* 38* 34* 43*  ANIONGAP 11 10 9 9      Hematology Recent Labs  Lab 12/11/17 1315 12/12/17 0314 12/14/17 0338  WBC 30.2* 18.1* 13.5*  RBC 3.49* 3.17* 3.13*  HGB 11.3* 10.3* 9.9*  HCT 34.4* 31.0* 30.3*  MCV 98.6 97.8 96.8  MCH 32.3 32.6 31.6  MCHC 32.8 33.3 32.7  RDW 15.4* 15.6* 15.5*  PLT 470* 399 399    Cardiac Enzymes Recent Labs  Lab 12/11/17 1315 12/12/17 0904 12/12/17 1417 12/12/17 2041  TROPONINI 0.05* 0.03* 0.04* 0.03*     BNP Recent Labs  Lab 12/11/17 1315  BNP 1,399.0*     Radiology    Dg Chest 1 View  Result Date: 12/13/2017 CLINICAL DATA:  Post thoracentesis on the left. EXAM: CHEST 1 VIEW COMPARISON:  Radiographs 12/11/2017.  CT 12/11/2017. FINDINGS: 1003 hour. The left pleural effusion appears slightly smaller. There are persistent small bilateral pleural effusions. The heart is enlarged and there is aortic atherosclerosis. Bilateral perihilar airspace opacities have mildly improved. There is no pneumothorax. IMPRESSION: No pneumothorax following thoracentesis. Improving bilateral perihilar airspace opacities and left pleural effusion. Electronically Signed   By: Richardean Sale M.D.   On: 12/13/2017 10:19   US Thoracentesis  Asp Pleural Space W/img Guide  Result Date: 12/13/2017 INDICATION: Hypoxia and bilateral pneumonia.  Bilateral pleural effusions. EXAM: ULTRASOUND GUIDED LEFT THORACENTESIS MEDICATIONS: None. COMPLICATIONS: None immediate. PROCEDURE: An ultrasound guided thoracentesis was thoroughly discussed with the patient and questions answered. The benefits, risks, alternatives and complications were also discussed. The patient understands and wishes to proceed with the procedure. Written consent was obtained. Ultrasound was performed to localize and mark an adequate pocket of fluid in the left chest. The area was then prepped and draped in the normal sterile fashion. 1% Lidocaine was used for local anesthesia. Under ultrasound guidance a 6 Fr Safe-T-Centesis catheter was introduced. Thoracentesis was performed. The catheter was removed and a dressing applied. FINDINGS: A total of approximately 350 mL of yellow fluid was removed. Samples were sent to the laboratory as requested by the clinical team. IMPRESSION: Successful ultrasound guided left thoracentesis yielding 350 mL of pleural fluid. Electronically Signed   By: Markus Daft M.D.   On: 12/13/2017 10:22    Telemetry    RSR - Personally Reviewed  Cardiac Studies   TTE (11/29/17): - Left ventricle: The cavity size was normal. There was moderate concentric hypertrophy. Systolic function was normal. The estimated ejection fraction was in the range of 55% to 60%. Wall motion was normal; there were no regional wall motion abnormalities. Features are consistent with a pseudonormal left ventricular filling pattern, with concomitant abnormal relaxation and increased filling pressure (grade 2 diastolic dysfunction). - Mitral valve: Calcified annulus. Mildly thickened, severely calcified leaflets . The findings are consistent with moderate stenosis. There was mild regurgitation. Mean gradient (D): 10 mm Hg. Valve area by pressure half-time:  1.91 cm^2. - Left atrium: The atrium was moderately dilated. - Pulmonary arteries: Systolic pressure was moderately increased. PA peak pressure: 50 mm Hg (S).   Patient Profile     76 y.o. female with a hx of chronic diastolic CHF, pulmonary hypertension, PAF on Eliquis, nonobstructive CAD by LHC in 2005, moderate mitral stenosis, prior TIA/CVA, CKD stage III-IV, anemia of chronic disease, bilateral carotid artery disease, OA s/p recent LTKA, HTN, and HLD, admitted with worsening shortness of breath due to a combination of acute on chronic diastolic heart failure and multifocal pneumonia.  Assessment & Plan    1.  Acute resp distress w/ hypoxia: multifactorial in the setting of bilat upper lobe pna, bilat pl effusion, and diast chf.  CTA neg for PE on admission.  Improved following thoracentesis on the left yesterday.  No plan for R sided thoracentesis. Volume status looks good - will change lasix to 40 po daily.  Abx per IM.  2.  Acute on chronic diastolic CHF: minus 8.4R for admission.  ? Accuracy of wt - listed @ 165, which would be 8-10 lbs above where she previously trended.  Euvolemic on exam.  Will switch lasix 40 PO daily.  Was on 20 daily @ home following last admission but may need a little more in setting of CKD.  HR/BP stable.  3. Moderate Mitral Stenosis: Cont  blocker and dilt for HR/BP mgmt.  Plan on further outpt w/u w/ TEE +/[facility] once she is fully recovered from multiple hospitalizations.  4. PAF:  Maintaining sinus rhythm on dilt and  blocker. Eliquis held for thoracentesis yesterday.  Plan to resume if no plans for additional invasive procedures.  CHA2DS2VASc = 8.  5.  CKD III-IV: stable.  Changing to PO lasix.  6.  Demand Ischemia:  In setting of above.  Minimally elevated trop - likely 2/2 supply-demand mismatch.  No c/p.  No further w/u @ this time.  Signed, Murray Hodgkins, NP  12/14/2017, 10:33 AM    For questions or updates, please contact   Please  consult [email or web address] for contact info under Cardiology/STEMI.

## 2017-12-15 ENCOUNTER — Telehealth: Payer: Self-pay | Admitting: Cardiovascular Disease

## 2017-12-15 ENCOUNTER — Inpatient Hospital Stay: Payer: Medicare Other

## 2017-12-15 LAB — CULTURE, BLOOD (ROUTINE X 2): SPECIAL REQUESTS: ADEQUATE

## 2017-12-15 LAB — BASIC METABOLIC PANEL
Anion gap: 9 (ref 5–15)
BUN: 35 mg/dL — AB (ref 6–20)
CALCIUM: 8.7 mg/dL — AB (ref 8.9–10.3)
CO2: 27 mmol/L (ref 22–32)
Chloride: 99 mmol/L — ABNORMAL LOW (ref 101–111)
Creatinine, Ser: 1.39 mg/dL — ABNORMAL HIGH (ref 0.44–1.00)
GFR calc Af Amer: 41 mL/min — ABNORMAL LOW (ref >60)
GFR, EST NON AFRICAN AMERICAN: 36 mL/min — AB (ref >60)
GLUCOSE: 98 mg/dL (ref 65–99)
Potassium: 3.9 mmol/L (ref 3.5–5.1)
Sodium: 135 mmol/L (ref 135–145)

## 2017-12-15 MED ORDER — ALBUTEROL SULFATE HFA 108 (90 BASE) MCG/ACT IN AERS: 2.0000 | INHALATION_SPRAY | RESPIRATORY_TRACT | 0 refills | Status: DC | PRN

## 2017-12-15 MED ORDER — FUROSEMIDE 40 MG PO TABS: 40.0000 mg | ORAL_TABLET | Freq: Every day | ORAL | 0 refills | Status: DC

## 2017-12-15 MED ORDER — CEFPODOXIME PROXETIL 200 MG PO TABS: 200.0000 mg | ORAL_TABLET | Freq: Two times a day (BID) | ORAL | 0 refills | Status: DC

## 2017-12-15 NOTE — Progress Notes (Signed)
Progress Note  Patient Name: Caroline [name] Date of Encounter: 12/15/2017  Primary Cardiologist: Ida Rogue, MD  Subjective   Feels well this am.  No dyspnea.  Off O2 currently.  Hopeful to go home today.  Inpatient Medications    Scheduled Meds: . allopurinol  100 mg Oral Daily  . apixaban  5 mg Oral BID  . bisacodyl  10 mg Oral Daily  . diltiazem  120 mg Oral Daily  . docusate sodium  100 mg Oral BID  . feeding supplement (ENSURE ENLIVE)  237 mL Oral BID BM  . furosemide  40 mg Oral Daily  . levothyroxine  112 mcg Oral QAC breakfast  . lisinopril  40 mg Oral Daily  . metoprolol tartrate  50 mg Oral BID  . multivitamin with minerals  1 tablet Oral Daily  . simvastatin  20 mg Oral Daily   Continuous Infusions: . ceFEPime (MAXIPIME) IV Stopped (12/14/17 2158)   PRN Meds: acetaminophen **OR** acetaminophen, bisacodyl, nitroGLYCERIN, ondansetron **OR** ondansetron (ZOFRAN) IV, oxyCODONE   Vital Signs    Vitals:   12/14/17 1253 12/14/17 1748 12/14/17 2036 12/15/17 0329  BP: (!) 129/58 (!) 136/47 (!) 105/51 (!) 135/50  Pulse:  66 73 70  Resp:      Temp:  98.6 F (37 C) 98.1 F (36.7 C) 98.7 F (37.1 C)  TempSrc:   Oral Oral  SpO2:  95% 98% 96%  Weight:    163 lb 9.6 oz (74.2 kg)  Height:        Intake/Output Summary (Last 24 hours) at 12/15/2017 0754 Last data filed at 12/15/2017 0651 Gross per 24 hour  Intake 840 ml  Output 1050 ml  Net -210 ml   Filed Weights   12/13/17 0513 12/14/17 0428 12/15/17 0329  Weight: 165 lb 3.2 oz (74.9 kg) 165 lb 1.6 oz (74.9 kg) 163 lb 9.6 oz (74.2 kg)    Physical Exam   GEN: Well nourished, well developed, in no acute distress.  HEENT: Grossly normal.  Neck: Supple, no JVD, carotid bruits, or masses. Cardiac: RRR, 2/6 syst murmur w/ 1/6 diast rumble @ apex, no rubs, or gallops. No clubbing, cyanosis, edema.  Radials/DP/PT 2+ and equal bilaterally.  Respiratory:  Respirations regular and unlabored, clear to  auscultation bilaterally. GI: Soft, nontender, nondistended, BS + x 4. MS: no deformity or atrophy. Skin: warm and dry, no rash. Neuro:  Strength and sensation are intact. Psych: AAOx3.  Normal affect.  Labs    Chemistry Recent Labs  Lab 12/11/17 1315  12/13/17 0646 12/14/17 0338 12/15/17 0458  NA 135   < > 136 133* 135  K 4.2   < > 3.6 3.3* 3.9  CL 99*   < > 97* 95* 99*  CO2 25   < > 30 29 27   GLUCOSE 136*   < > 97 104* 98  BUN 30*   < > 42* 40* 35*  CREATININE 1.24*   < > 1.63* 1.35* 1.39*  CALCIUM 9.0   < > 8.7* 8.6* 8.7*  PROT 6.5  --   --   --   --   ALBUMIN 2.9*  --   --   --   --   AST 27  --   --   --   --   ALT 25  --   --   --   --   ALKPHOS 79  --   --   --   --   BILITOT  1.0  --   --   --   --   GFRNONAA 41*   < > 29* 37* 36*  GFRAA 47*   < > 34* 43* 41*  ANIONGAP 11   < > 9 9 9    < > = values in this interval not displayed.     Hematology Recent Labs  Lab 12/11/17 1315 12/12/17 0314 12/14/17 0338  WBC 30.2* 18.1* 13.5*  RBC 3.49* 3.17* 3.13*  HGB 11.3* 10.3* 9.9*  HCT 34.4* 31.0* 30.3*  MCV 98.6 97.8 96.8  MCH 32.3 32.6 31.6  MCHC 32.8 33.3 32.7  RDW 15.4* 15.6* 15.5*  PLT 470* 399 399    Cardiac Enzymes Recent Labs  Lab 12/11/17 1315 12/12/17 0904 12/12/17 1417 12/12/17 2041  TROPONINI 0.05* 0.03* 0.04* 0.03*     BNP Recent Labs  Lab 12/11/17 1315  BNP 1,399.0*      Radiology    Dg Chest 1 View  Result Date: 12/13/2017 CLINICAL DATA:  Post thoracentesis on the left. EXAM: CHEST 1 VIEW COMPARISON:  Radiographs 12/11/2017.  CT 12/11/2017. FINDINGS: 1003 hour. The left pleural effusion appears slightly smaller. There are persistent small bilateral pleural effusions. The heart is enlarged and there is aortic atherosclerosis. Bilateral perihilar airspace opacities have mildly improved. There is no pneumothorax. IMPRESSION: No pneumothorax following thoracentesis. Improving bilateral perihilar airspace opacities and left pleural  effusion. Electronically Signed   By: Richardean Sale M.D.   On: 12/13/2017 10:19   US Thoracentesis Asp Pleural Space W/img Guide  Result Date: 12/13/2017 INDICATION: Hypoxia and bilateral pneumonia.  Bilateral pleural effusions. EXAM: ULTRASOUND GUIDED LEFT THORACENTESIS MEDICATIONS: None. COMPLICATIONS: None immediate. PROCEDURE: An ultrasound guided thoracentesis was thoroughly discussed with the patient and questions answered. The benefits, risks, alternatives and complications were also discussed. The patient understands and wishes to proceed with the procedure. Written consent was obtained. Ultrasound was performed to localize and mark an adequate pocket of fluid in the left chest. The area was then prepped and draped in the normal sterile fashion. 1% Lidocaine was used for local anesthesia. Under ultrasound guidance a 6 Fr Safe-T-Centesis catheter was introduced. Thoracentesis was performed. The catheter was removed and a dressing applied. FINDINGS: A total of approximately 350 mL of yellow fluid was removed. Samples were sent to the laboratory as requested by the clinical team. IMPRESSION: Successful ultrasound guided left thoracentesis yielding 350 mL of pleural fluid. Electronically Signed   By: Markus Daft M.D.   On: 12/13/2017 10:22    Telemetry    RSR/sinus brady - Personally Reviewed  Cardiac Studies   TTE (11/29/17): - Left ventricle: The cavity size was normal. There was moderate concentric hypertrophy. Systolic function was normal. The estimated ejection fraction was in the range of 55% to 60%. Wall motion was normal; there were no regional wall motion abnormalities. Features are consistent with a pseudonormal left ventricular filling pattern, with concomitant abnormal relaxation and increased filling pressure (grade 2 diastolic dysfunction). - Mitral valve: Calcified annulus. Mildly thickened, severely calcified leaflets . The findings are consistent with  moderate stenosis. There was mild regurgitation. Mean gradient (D): 10 mm Hg. Valve area by pressure half-time: 1.91 cm^2. - Left atrium: The atrium was moderately dilated. - Pulmonary arteries: Systolic pressure was moderately increased. PA peak pressure: 50 mm Hg (S).  Patient Profile     76 y.o.femalewith a hx of chronic diastolic CHF, pulmonary hypertension, PAF on Eliquis, nonobstructive CAD by LHC in 2005, moderate mitral stenosis, prior TIA/CVA, CKD  stage III-IV, anemia of chronic disease, bilateral carotid artery disease, OA s/p recent LTKA, HTN, and HLD, admitted with worsening shortness of breath due to a combination of acute on chronic diastolic heart failure and multifocal pneumonia.  Assessment & Plan    1.  Acute resp distress w/ hypoxia:  Multifactorial in the setting of bilat upper lobe PNA, bilat pl effusions, and diast CHF. CTA neg for PE. Feeling much better.  Currently off of O2.  S/p L thoracentesis on 12/19.  Lungs clear.  Euvolemic.  Cont PO lasix and abx.  2.  Acute on chronic diastolic CHF:  Switched to po lasix yesterday.  Minus 1.5L for admission.  Wt 163 this AM on bedscale.  ? Accuracy - still about 5 lbs above preop wts.  Will ask for standing weight. euvolemic on exam.  Cont lasix 40 daily @ discharge (an increase from prior dose). She has f/u with Johnny Bridge, MD on 12/28/17.  3.  Mod Mitral stenosis:  Will plan on further outpt w/u w/ TEE +/[facility] once fully recovered from multiple hospitalizations.  4.  PAF:  Maintaining sinus on dilt/ blocker.  CHA2DS2VASc= 8. Cont eliquis.  5.  CKD III-IV:  Stable.  6.  Demand Ischemia:  In setting of above  supply/demand.  No c/p.  No further w/u @ this time.  Signed, Murray Hodgkins, NP  12/15/2017, 7:54 AM    For questions or updates, please contact   Please consult [email or web address] for contact info under Cardiology/STEMI.

## 2017-12-15 NOTE — Progress Notes (Signed)
Caroline [name] to be D/C'd Home per MD order.  Discussed prescriptions and follow up appointments with the patient. Prescriptions given to patient, medication list explained in detail. Pt verbalized understanding.  Allergies as of 12/15/2017   No Known Allergies     Medication List    TAKE these medications   acetaminophen 500 MG tablet Commonly known as:  TYLENOL Take 1,000 mg by mouth daily as needed (for pain.).   albuterol 108 (90 Base) MCG/ACT inhaler Commonly known as:  PROVENTIL HFA;VENTOLIN HFA Inhale 2 puffs into the lungs every 4 (four) hours as needed for wheezing or shortness of breath.   allopurinol 100 MG tablet Commonly known as:  ZYLOPRIM Take 100 mg by mouth daily.   apixaban 5 MG Tabs tablet Commonly known as:  ELIQUIS Take 1 tablet (5 mg total) by mouth 2 (two) times daily.   CARTIA XT 120 MG 24 hr capsule Generic drug:  diltiazem TAKE 1 CAPSULE BY MOUTH ONCE DAILY   cefpodoxime 200 MG tablet Commonly known as:  VANTIN Take 1 tablet (200 mg total) by mouth 2 (two) times daily.   cyanocobalamin 1000 MCG/ML injection Commonly known as:  (VITAMIN B-12) Inject 1,000 mcg into the muscle every 30 (thirty) days.   furosemide 40 MG tablet Commonly known as:  LASIX Take 1 tablet (40 mg total) by mouth daily. What changed:    medication strength  how much to take   KLOR-CON M10 10 MEQ tablet Generic drug:  potassium chloride Take 10 mEq by mouth 2 (two) times daily.   levothyroxine 112 MCG tablet Commonly known as:  SYNTHROID, LEVOTHROID Take 112 mcg by mouth daily before breakfast.   LINIMENTS EX Apply 1 application topically 3 (three) times daily as needed (for pain). Horse Lininment   lisinopril 40 MG tablet Commonly known as:  PRINIVIL,ZESTRIL Take 1 tablet (40 mg total) by mouth daily.   metoprolol tartrate 50 MG tablet Commonly known as:  LOPRESSOR Take 1 tablet (50 mg total) by mouth 2 (two) times daily.   oxyCODONE 5 MG immediate  release tablet Commonly known as:  Oxy IR/ROXICODONE Take 1 tablet (5 mg total) by mouth every 4 (four) hours as needed for moderate pain ((score 4 to 6)).   simvastatin 20 MG tablet Commonly known as:  ZOCOR Take 20 mg by mouth daily.            Durable Medical Equipment  (From admission, onward)        Start     Ordered   12/15/17 1008  For home use only DME oxygen  Once    Question Answer Comment  Mode or (Route) Nasal cannula   Liters per Minute 2   Frequency Continuous (stationary and portable oxygen unit needed)   Oxygen delivery system Gas      12/15/17 1007      Vitals:   12/15/17 0329 12/15/17 0952  BP: (!) 135/50 128/63  Pulse: 70 71  Resp:  18  Temp: 98.7 F (37.1 C)   SpO2: 96% 96%    Tele box removed and returned.Skin clean, dry and intact without evidence of skin break down, no evidence of skin tears noted. IV catheter discontinued intact. Site without signs and symptoms of complications. Dressing and pressure applied. Pt denies pain at this time. No complaints noted.  An After Visit Summary was printed and given to the patient. Patient escorted via [place], and D/C home via private auto.  [name]

## 2017-12-15 NOTE — Telephone Encounter (Signed)
TCM....  Patient is being discharged   They saw Caroline [name]  They are scheduled to see Dr [name] on 1/3  They were seen for chronic CHF  They need to be seen within 1 week  Pt added to wait list   Please call

## 2017-12-15 NOTE — Care Management Important Message (Signed)
Important Message  Patient Details  Name: Caroline [name] MRN: [id number] Date of Birth: 1941-10-14   Medicare Important Message Given:  Yes  Signed IM notice given   Katrina Stack, RN 12/15/2017, 11:51 AM

## 2017-12-15 NOTE — Discharge Instructions (Signed)
Information on my medicine - ELIQUIS (apixaban)  This medication education was reviewed with me or my healthcare representative as part of my discharge preparation.  The pharmacist that spoke with me during my hospital stay was:  [facility]  Why was Eliquis prescribed for you? Eliquis was prescribed for you to reduce the risk of a blood clot forming that can cause a stroke if you have a medical condition called atrial fibrillation (a type of irregular heartbeat).  What do You need to know about Eliquis ? Take your Eliquis TWICE DAILY - one tablet in the morning and one tablet in the evening with or without food. If you have difficulty swallowing the tablet whole please discuss with your pharmacist how to take the medication safely.  Take Eliquis exactly as prescribed by your doctor and DO NOT stop taking Eliquis without talking to the doctor who prescribed the medication.  Stopping may increase your risk of developing a stroke.  Refill your prescription before you run out.  After discharge, you should have regular check-up appointments with your healthcare provider that is prescribing your Eliquis.  In the future your dose may need to be changed if your kidney function or weight changes by a significant amount or as you get older.  What do you do if you miss a dose? If you miss a dose, take it as soon as you remember on the same day and resume taking twice daily.  Do not take more than one dose of ELIQUIS at the same time to make up a missed dose.  Important Safety Information A possible side effect of Eliquis is bleeding. You should call your healthcare provider right away if you experience any of the following: ? Bleeding from an injury or your nose that does not stop. ? Unusual colored urine (red or dark brown) or unusual colored stools (red or black). ? Unusual bruising for unknown reasons. ? A serious fall or if you hit your head (even if there is no bleeding).  Some  medicines may interact with Eliquis and might increase your risk of bleeding or clotting while on Eliquis. To help avoid this, consult your healthcare provider or pharmacist prior to using any new prescription or non-prescription medications, including herbals, vitamins, non-steroidal anti-inflammatory drugs (NSAIDs) and supplements.  This website has more information on Eliquis (apixaban): http://www.eliquis.com/eliquis/home     - Daily fluids < 2 liters. - Low salt diet - Check weight everyday and keep log. Take to your doctors appt. - Take extra dose of lasix if you gain more than 3 pounds weight.

## 2017-12-15 NOTE — Telephone Encounter (Signed)
Patient currently admitted at this time. 

## 2017-12-15 NOTE — Care Management (Signed)
For discharge home. No agency preference for Home oxygen. Referral to [facility]. Notified [facility] of discharge and order present for home health  SN PT

## 2017-12-15 NOTE — Progress Notes (Signed)
  Subjective:  Patient readmitted for pneumonia.   She is s/p left TKA on 11/28/17.  Patient reports pain as minimal.  Patient is being discharged today.  I was supposed to see her in the office today for a post-op visit, so I am seeing her in the hospital instead.  She states she walked around the nurses station with PT and she is doing well in regards to her left knee.  Objective:   VITALS:   Vitals:   12/14/17 1748 12/14/17 2036 12/15/17 0329 12/15/17 0952  BP: (!) 136/47 (!) 105/51 (!) 135/50 128/63  Pulse: 66 73 70 71  Resp:    18  Temp: 98.6 F (37 C) 98.1 F (36.7 C) 98.7 F (37.1 C)   TempSrc:  Oral Oral   SpO2: 95% 98% 96% 96%  Weight:   74.2 kg (163 lb 9.6 oz)   Height:        PHYSICAL EXAM: Left lower extremity:  I personally removed the patient's staples today and changed her dressing. Neurovascular intact Sensation intact distally Intact pulses distally Dorsiflexion/Plantar flexion intact Incision: no drainage No cellulitis present Compartment soft  LABS  Results for orders placed or performed during the hospital encounter of 12/11/17 (from the past 24 hour(s))  Basic metabolic panel     Status: Abnormal   Collection Time: 12/15/17  4:58 AM  Result Value Ref Range   Sodium 135 135 - 145 mmol/L   Potassium 3.9 3.5 - 5.1 mmol/L   Chloride 99 (L) 101 - 111 mmol/L   CO2 27 22 - 32 mmol/L   Glucose, Bld 98 65 - 99 mg/dL   BUN 35 (H) 6 - 20 mg/dL   Creatinine, Ser 1.39 (H) 0.44 - 1.00 mg/dL   Calcium 8.7 (L) 8.9 - 10.3 mg/dL   GFR calc non Af Amer 36 (L) >60 mL/min   GFR calc Af Amer 41 (L) >60 mL/min   Anion gap 9 5 - 15    Dg Chest 2 View  Result Date: 12/15/2017 CLINICAL DATA:  Shortness of breath. EXAM: CHEST  2 VIEW COMPARISON:  Radiograph of December 13, 2017. FINDINGS: Stable cardiomediastinal silhouette. Atherosclerosis of thoracic aorta is noted. No pneumothorax is noted. Stable bilateral perihilar opacities are noted concerning for pneumonia. No  significant pleural effusion is noted. Bony thorax is unremarkable. IMPRESSION: Stable bilateral perihilar opacities are noted concerning for pneumonia. Aortic atherosclerosis. Electronically Signed   By: Marijo Conception, M.D.   On: 12/15/2017 08:57    Assessment/Plan:     Active Problems:   CHF exacerbation [facility])  Patient doing well in regards to her left knee.  Pneumonia is improving.  She will follow up with me in the office in 2-3 weeks.  She will do home PT in the meantime.    Th[place]ton [place] , MD 12/15/2017, 1:20 PM

## 2017-12-16 LAB — BODY FLUID CULTURE: CULTURE: NO GROWTH

## 2017-12-16 LAB — CULTURE, BLOOD (ROUTINE X 2)
CULTURE: NO GROWTH
Special Requests: ADEQUATE

## 2017-12-18 NOTE — Telephone Encounter (Signed)
Patient contacted regarding discharge from [facility] on 12/15/17.   Patient understands to follow up with provider ? On 12/28/17 at 1:40 pm at [facility].  Patient understands discharge instructions? Yes  Patient understands medications and regiment? Yes  Patient understands to bring all medications to this visit? Yes

## 2017-12-20 ENCOUNTER — Encounter (INDEPENDENT_AMBULATORY_CARE_PROVIDER_SITE_OTHER): Payer: Medicare Other

## 2017-12-20 ENCOUNTER — Ambulatory Visit (INDEPENDENT_AMBULATORY_CARE_PROVIDER_SITE_OTHER): Payer: Medicare Other | Admitting: Vascular Surgery

## 2017-12-21 NOTE — Discharge Summary (Signed)
[place] [facility] [place] NAME: Caroline [name]    MR#:  [id number]  DATE OF BIRTH:  [birth year]  DATE OF ADMISSION:  12/11/2017 ADMITTING PHYSICIAN: Gladstone Lighter, MD  DATE OF DISCHARGE: 12/15/2017  3:08 PM  PRIMARY CARE PHYSICIAN: Remi Haggard, FNP   ADMISSION DIAGNOSIS:  Nausea [R11.0] Hypoxia [R09.02] Elevated troponin [R74.8] Leukocytosis, unspecified type [D72.829] Acute on chronic congestive heart failure, unspecified heart failure type ([place]) [I50.9]  DISCHARGE DIAGNOSIS:  Active Problems:   CHF exacerbation ([place])   SECONDARY DIAGNOSIS:   Past Medical History:  Diagnosis Date  . Arthritis   . Chronic diastolic CHF (congestive heart failure) ([place])    a. 05/2017 Echo: 65-70%, mild AI, moderate MR, mildly dilated LA; b. 11/2017 Echo: EF 55-60%, no rwma, Gr2 DD, mod MS, mild MR, mod dil LA, PASP 79mmHg.  . CKD (chronic kidney disease), stage IV ([place])   . History of kidney stones 01/2017  . Hypertension   . Hypothyroidism   . Non-obstructive CAD (coronary artery disease)    a. 2005 Cath: LCX 20-23m, otw nl cors.  Marland Kitchen PAF (paroxysmal atrial fibrillation) ([place])    a. On Eliquis; b. CHADS2VASc => 8 (CHF, HTN, age x 2, stroke x 2, vascular disease, female)  . Stroke [facility])    TIA 9/17 & 04/06/2017     ADMITTING HISTORY  Caroline [name]  is a 75 y.o. female with a known history of CKD, diastolic CHF, hypertension, paroxysmal afib on eliquis with prior h/o stroke and no residual neurological changes . Had a recent left total knee replacement surgery done and was discharged home came back for pneumonia and CHF exacerbation and was just discharged home 3 days ago is brought back by family secondary to worsening shortness of breath. During her recent hospitalization she was diuresed and was also treated with antibiotics for possible pneumonia. She had persistently elevated white count and was seen by infectious disease specialist as well. At  the time of discharge patient was weaned off oxygen. She started feeling better respiratory wise up until her symptoms started back again yesterday. She has been consistently nauseated and has been extremely weak at home. Has not been able to use the incentive spirometer as much. She continues to take her full dose of eliquis. Yesterday her breathing was much worse, and that did not improve this morning, unable to sleep and so came to the hospital. Denies any cough, fevers or chills. Has nausea and has been having extremely poor appetite. CT chest revealing scattered infiltrates and possible congestive heart failure.    HOSPITAL COURSE:   76 year old female with past medical history significant for CK D stage III, hypertension, diastolic CHF, paroxysmal atrial fibrillation, history of TIA and arthritis who recently had a left total knee replacement surgery done last week presents with acute hypoxic respiratory failure.  * Acute hypoxic respiratory failure due to bilateral pneumonia and acute on chronic diastolic congestive heart failure Weaned off oxygen.  * Acute on chronic diastolic congestive heart failure. Resolved. Changed lasix to PO  * Bilateral pneumonia.   Treated with IV antibiotics in the hospital.  Cultures negative.  Switch to oral antibiotics at discharge.  *Bilateral pleural effusions likely due to congestive heart failure.   Left-sided thoracentesis with 350 mL removed.  No significant change to breathing.  This was transudate.  Due to congestive heart failure.  Will continue Lasix.  Follow-up as outpatient with cardiology.  * Mild AKI over CKD stage  III due to diuresis Improved  * Recent left total knee replacement surgery-stable.pain medications as needed  * Paroxysmal atrial fibrillation-heart rate well controlled. On metoprolol.  Restarted Eliquis  Patient discharged home in stable condition with home health services.  CONSULTS OBTAINED:  Treatment Team:   Wellington [place], MD Leonel Ramsay, MD  DRUG ALLERGIES:  No Known Allergies  DISCHARGE MEDICATIONS:   Allergies as of 12/15/2017   No Known Allergies     Medication List    TAKE these medications   acetaminophen 500 MG tablet Commonly known as:  TYLENOL Take 1,000 mg by mouth daily as needed (for pain.).   albuterol 108 (90 Base) MCG/ACT inhaler Commonly known as:  PROVENTIL HFA;VENTOLIN HFA Inhale 2 puffs into the lungs every 4 (four) hours as needed for wheezing or shortness of breath.   allopurinol 100 MG tablet Commonly known as:  ZYLOPRIM Take 100 mg by mouth daily.   apixaban 5 MG Tabs tablet Commonly known as:  ELIQUIS Take 1 tablet (5 mg total) by mouth 2 (two) times daily.   CARTIA XT 120 MG 24 hr capsule Generic drug:  diltiazem TAKE 1 CAPSULE BY MOUTH ONCE DAILY   cefpodoxime 200 MG tablet Commonly known as:  VANTIN Take 1 tablet (200 mg total) by mouth 2 (two) times daily.   cyanocobalamin 1000 MCG/ML injection Commonly known as:  (VITAMIN B-12) Inject 1,000 mcg into the muscle every 30 (thirty) days.   furosemide 40 MG tablet Commonly known as:  LASIX Take 1 tablet (40 mg total) by mouth daily. What changed:    medication strength  how much to take   KLOR-CON M10 10 MEQ tablet Generic drug:  potassium chloride Take 10 mEq by mouth 2 (two) times daily.   levothyroxine 112 MCG tablet Commonly known as:  SYNTHROID, LEVOTHROID Take 112 mcg by mouth daily before breakfast.   LINIMENTS EX Apply 1 application topically 3 (three) times daily as needed (for pain). Horse Lininment   lisinopril 40 MG tablet Commonly known as:  PRINIVIL,ZESTRIL Take 1 tablet (40 mg total) by mouth daily.   metoprolol tartrate 50 MG tablet Commonly known as:  LOPRESSOR Take 1 tablet (50 mg total) by mouth 2 (two) times daily.   oxyCODONE 5 MG immediate release tablet Commonly known as:  Oxy IR/ROXICODONE Take 1 tablet (5 mg total) by mouth every 4  (four) hours as needed for moderate pain ((score 4 to 6)).   simvastatin 20 MG tablet Commonly known as:  ZOCOR Take 20 mg by mouth daily.       Today   VITAL SIGNS:  Blood pressure 128/63, pulse 71, temperature 98.7 F (37.1 C), temperature source Oral, resp. rate 18, height 5' 2.5" (1.588 m), weight 74.2 kg (163 lb 9.6 oz), SpO2 96 %.  I/O:  No intake or output data in the 24 hours ending 12/21/17 1634  PHYSICAL EXAMINATION:  Physical Exam  GENERAL:  76 y.o.-year-old patient lying in the bed with no acute distress.  LUNGS: Normal breath sounds bilaterally, no wheezing, rales,rhonchi or crepitation. No use of accessory muscles of respiration.  CARDIOVASCULAR: S1, S2 normal. No murmurs, rubs, or gallops.  ABDOMEN: Soft, non-tender, non-distended. Bowel sounds present. No organomegaly or mass.  NEUROLOGIC: Moves all 4 extremities. PSYCHIATRIC: The patient is alert and oriented x 3.  SKIN: No obvious rash, lesion, or ulcer.   DATA REVIEW:   CBC No results for input(s): WBC, HGB, HCT, PLT in the last 168 hours.  Chemistries  Recent Labs  Lab 12/15/17 0458  NA 135  K 3.9  CL 99*  CO2 27  GLUCOSE 98  BUN 35*  CREATININE 1.39*  CALCIUM 8.7*    Cardiac Enzymes No results for input(s): TROPONINI in the last 168 hours.  Microbiology Results  Results for orders placed or performed during the hospital encounter of 12/11/17  Blood culture (routine x 2)     Status: None   Collection Time: 12/11/17  1:54 PM  Result Value Ref Range Status   Specimen Description BLOOD RIGHT ASSIST CONTROL  Final   Special Requests   Final    BOTTLES DRAWN AEROBIC AND ANAEROBIC Blood Culture adequate volume   Culture   Final    NO GROWTH 5 DAYS Performed at [facility], [address] [place], [place] [address]    Report Status 12/16/2017 FINAL  Final  Blood culture (routine x 2)     Status: Abnormal   Collection Time: 12/11/17  1:54 PM  Result Value Ref Range Status    Specimen Description BLOOD LEFT ASSIST CONTROL  Final   Special Requests   Final    BOTTLES DRAWN AEROBIC AND ANAEROBIC Blood Culture adequate volume   Culture  Setup Time   Final    GRAM POSITIVE COCCI AEROBIC BOTTLE ONLY CRITICAL RESULT CALLED TO, READ BACK BY AND VERIFIED WITH: JASON ROBBINS @ [id number] ON 12/12/2017 BY CAF    Culture (A)  Final    STAPHYLOCOCCUS SPECIES (COAGULASE NEGATIVE) THE SIGNIFICANCE OF ISOLATING THIS ORGANISM FROM A SINGLE SET OF BLOOD CULTURES WHEN MULTIPLE SETS ARE DRAWN IS UNCERTAIN. PLEASE NOTIFY THE MICROBIOLOGY DEPARTMENT WITHIN ONE WEEK IF SPECIATION AND SENSITIVITIES ARE REQUIRED. Performed at [facility], [place] [address], [place], [place] [address]    Report Status 12/15/2017 FINAL  Final  Urine culture     Status: Abnormal   Collection Time: 12/11/17  1:54 PM  Result Value Ref Range Status   Specimen Description URINE, CLEAN CATCH  Final   Special Requests NONE  Final   Culture MULTIPLE SPECIES PRESENT, SUGGEST RECOLLECTION (A)  Final   Report Status 12/12/2017 FINAL  Final  Blood Culture ID Panel (Reflexed)     Status: Abnormal   Collection Time: 12/11/17  1:54 PM  Result Value Ref Range Status   Enterococcus species NOT DETECTED NOT DETECTED Final   Listeria monocytogenes NOT DETECTED NOT DETECTED Final   Staphylococcus species DETECTED (A) NOT DETECTED Final    Comment: Methicillin (oxacillin) susceptible coagulase negative staphylococcus. Possible blood culture contaminant (unless isolated from more than one blood culture draw or clinical case suggests pathogenicity). No antibiotic treatment is indicated for blood  culture contaminants. CRITICAL RESULT CALLED TO, READ BACK BY AND VERIFIED WITH: JASON ROBBINS @ 6629 ON 12/12/2017 BY CAF    Staphylococcus aureus NOT DETECTED NOT DETECTED Final   Methicillin resistance NOT DETECTED NOT DETECTED Final   Streptococcus species NOT DETECTED NOT DETECTED Final   Streptococcus agalactiae NOT  DETECTED NOT DETECTED Final   Streptococcus pneumoniae NOT DETECTED NOT DETECTED Final   Streptococcus pyogenes NOT DETECTED NOT DETECTED Final   Acinetobacter baumannii NOT DETECTED NOT DETECTED Final   Enterobacteriaceae species NOT DETECTED NOT DETECTED Final   Enterobacter cloacae complex NOT DETECTED NOT DETECTED Final   Escherichia coli NOT DETECTED NOT DETECTED Final   Klebsiella oxytoca NOT DETECTED NOT DETECTED Final   Klebsiella pneumoniae NOT DETECTED NOT DETECTED Final   Proteus species NOT DETECTED NOT DETECTED Final  Serratia marcescens NOT DETECTED NOT DETECTED Final   Haemophilus influenzae NOT DETECTED NOT DETECTED Final   Neisseria meningitidis NOT DETECTED NOT DETECTED Final   Pseudomonas aeruginosa NOT DETECTED NOT DETECTED Final   Candida albicans NOT DETECTED NOT DETECTED Final   Candida glabrata NOT DETECTED NOT DETECTED Final   Candida krusei NOT DETECTED NOT DETECTED Final   Candida parapsilosis NOT DETECTED NOT DETECTED Final   Candida tropicalis NOT DETECTED NOT DETECTED Final  Body fluid culture     Status: None   Collection Time: 12/13/17  9:55 AM  Result Value Ref Range Status   Specimen Description   Final    PLEURAL Performed at [facility], [address], [place], [place] [address]    Special Requests   Final    NONE Performed at [facility], [address] [place], [place] [address]    Gram Stain   Final    RARE WBC PRESENT, PREDOMINANTLY PMN NO ORGANISMS SEEN    Culture   Final    NO GROWTH 3 DAYS Performed at [place] [address] [place], [place] [address]    Report Status 12/16/2017 FINAL  Final    RADIOLOGY:  No results found.  Follow up with PCP in 1 week.  Management plans discussed with the patient, family and they are in agreement.  CODE STATUS:  Code Status History    Date Active Date Inactive Code Status Order ID Comments User Context   12/11/2017 19:06 12/15/2017 18:13 Full Code  [id number]  Gladstone Lighter, MD Inpatient   12/05/2017 07:11 12/08/2017 18:58 Full Code [id number]  Saundra Shelling, MD Inpatient   11/28/2017 12:37 12/01/2017 19:27 Full Code [id number]  [place], MD Inpatient   06/06/2017 18:02 06/07/2017 18:12 Full Code [id number]  Nicholes Mango, MD ED    Advance Directive Documentation     Most Recent Value  Type of Advance Directive  Healthcare Power of Attorney  Pre-existing out of facility DNR order (yellow form or pink MOST form)  No data  "MOST" Form in Place?  No data      TOTAL TIME TAKING CARE OF THIS PATIENT ON DAY OF DISCHARGE: more than 30 minutes.   Leia Alf Jasmeet [place] M.D on 12/21/2017 at 4:34 PM  Between 7am to 6pm - Pager - [phone]  After 6pm go to [email or web address] - password [email or web address] [facility]  Office  [phone]  CC: Primary care physician; Remi Haggard, FNP  Note: This dictation was prepared with Dragon dictation along with smaller phrase technology. Any transcriptional errors that result from this process are unintentional.

## 2017-12-28 ENCOUNTER — Ambulatory Visit: Payer: Medicare Other | Admitting: Cardiovascular Disease

## 2018-01-06 NOTE — Progress Notes (Signed)
Cardiology Office Note  Date:  01/09/2018   ID:  Caroline [name], Self 1941/02/04, MRN [id number]  PCP:  Remi Haggard, FNP   Chief Complaint  Patient presents with  . other    CHF no complaints today. Meds reviewed verbally with pt.    HPI:  Ms. 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 Who presents by referral for CVA x2, arrhythmia, paroxysmal atrial fibrillation, diastolic CHF  In the hospital December 2018 acute respiratory failure  hospital records reviewed with the patient in detail Recent discharge after left knee total replacement  Readmitted with acute respiratory failure concerning for pneumonia, diastolic CHF Treated with antibiotics Had diuresis Found to have moderate mitral valve stenosis Paroxysmal atrial fibrillation maintained in sinus rhythm  Denies having any palpitations concerning for atrial fibrillation Typically atrial fibrillation she is asymptomatic Monitoring weight at home around 155 pounds since her discharge Taking Lasix 40 daily Previously was on 20 minute grams daily  On a prior office visit was in atrial fibrillation on EKG,  started on metoprolol and anticoagulation, eliquis Given a 30 day monitor. Initially showed episodes of paroxysmal atrial fibrillation Based on this we started her on Cardizem 120 mg daily , stopped amlodipine Clonidine held for bradycardia hypotension  Blood pressure stable at home Tolerating Crestor  EKG personally reviewed by myself on todays visit Shows normal sinus rhythm rate 65 bpm no significant ST or T-wave changes  Other past medical history reviewed carotidduplex with 40-59% ICA stenosis bilaterally old TIA on 09/24/2016,  HLD, total chol  280. She took herself off statins in the past  TIA 04/06/2017, lasted 10 min presented to the ED with a chief complaint of slurred speech and right-sided weakness. Symptoms  Resolved.  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   Previously scheduled to have surgery with Dr. Jamal Collin LAPAROSCOPIC CHOLECYSTECTOMY WITH INTRAOPERATIVE CHOLANGIOGRAM This seems to have been canceled given TIA symptoms  By report she was at primary care recently and noted to have irregular rhythm  Prior echocardiogram January 2014 showed normal LV function with ejection fraction 69%, mild TR The report does mention at least mild to moderate regurg, mild TR  Cardiac catheterization September 2005 showing 20-30% mid left circumflex disease, ejection fraction 60%, otherwise normal study   PMH:   has a past medical history of Arthritis, Chronic diastolic CHF (congestive heart failure) ([place]), CKD (chronic kidney disease), stage IV ([place]), History of kidney stones (01/2017), Hypertension, Hypothyroidism, Non-obstructive CAD (coronary artery disease), PAF (paroxysmal atrial fibrillation) ([place]), and Stroke ([place]).  PSH:    Past Surgical History:  Procedure Laterality Date  . ABDOMINAL HYSTERECTOMY    . CARDIAC CATHETERIZATION     Negative   . COLONOSCOPY  2010  . PARTIAL HYSTERECTOMY    . TOTAL KNEE ARTHROPLASTY Left 11/28/2017   Procedure: TOTAL KNEE ARTHROPLASTY;  Surgeon: Thornton Park, MD;  Location: [facility] ORS;  Service: Orthopedics;  Laterality: Left;    Current Outpatient Medications  Medication Sig Dispense Refill  . acetaminophen (TYLENOL) 500 MG tablet Take 1,000 mg by mouth daily as needed (for pain.).    Marland Kitchen albuterol (PROVENTIL HFA;VENTOLIN HFA) 108 (90 Base) MCG/ACT inhaler Inhale 2 puffs into the lungs every 4 (four) hours as needed for wheezing or shortness of breath.  1 Inhaler 0  . allopurinol (ZYLOPRIM) 100 MG tablet Take 100 mg by mouth daily.     Marland Kitchen apixaban (ELIQUIS) 5 MG TABS  tablet Take 1 tablet (5 mg total) by mouth 2 (two) times daily. 180 tablet 4  . CARTIA XT 120 MG 24 hr capsule TAKE 1 CAPSULE BY MOUTH ONCE DAILY 90 capsule 3  . cyanocobalamin (,VITAMIN B-12,) 1000 MCG/ML injection Inject 1,000 mcg into the muscle every 30 (thirty) days.    . furosemide (LASIX) 40 MG tablet Take 1 tablet (40 mg total) by mouth daily. 30 tablet 0  . KLOR-CON M10 10 MEQ tablet Take 10 mEq by mouth 2 (two) times daily.    Marland Kitchen levothyroxine (SYNTHROID, LEVOTHROID) 112 MCG tablet Take 112 mcg by mouth daily before breakfast.    . LINIMENTS EX Apply 1 application topically 3 (three) times daily as needed (for pain). Horse Lininment    . lisinopril (PRINIVIL,ZESTRIL) 40 MG tablet Take 1 tablet (40 mg total) by mouth daily. 30 tablet 0  . metoprolol tartrate (LOPRESSOR) 50 MG tablet Take 1 tablet (50 mg total) by mouth 2 (two) times daily. 60 tablet 11  . oxyCODONE (OXY IR/ROXICODONE) 5 MG immediate release tablet Take 1 tablet (5 mg total) by mouth every 4 (four) hours as needed for moderate pain ((score 4 to 6)). 40 tablet 0  . simvastatin (ZOCOR) 20 MG tablet Take 20 mg by mouth daily.     No current facility-administered medications for this visit.      Allergies:   Patient has no known allergies.   Social History:  The patient  reports that  has never smoked. she has never used smokeless tobacco. She reports that she drinks about 0.6 oz of alcohol per week. She reports that she does not use drugs.   Family History:   family history includes Heart attack (age of onset: 55) in her father; Heart disease in her brother; Hyperlipidemia in her father; Hypertension in her father.    Review of Systems: Review of Systems  Constitutional: Negative.   Respiratory: Negative.   Cardiovascular: Positive for leg swelling.  Gastrointestinal: Negative.   Musculoskeletal: Negative.   Neurological: Negative.        Slurred speech, right-sided weakness, resolved  Psychiatric/Behavioral:  Negative.   All other systems reviewed and are negative.    PHYSICAL EXAM: VS:  BP 128/78 (BP Location: Left Arm, Patient Position: Sitting, Cuff Size: Large)   Pulse 65   Ht 5' 2.5" (1.588 m)   Wt 158 lb (71.7 kg)   BMI 28.44 kg/m  , BMI Body mass index is 28.44 kg/m. GEN: Well nourished, well developed, in no acute distress  HEENT: normal  Neck: no JVD, carotid bruits, or masses Cardiac: Normal sinus rhythm , S1-S2 appreciated , no murmurs, rubs, or gallops,no edema  Respiratory:  clear to auscultation bilaterally, normal work of breathing GI: soft, nontender, nondistended, + BS MS: no deformity or atrophy  Skin: warm and dry, no rash Neuro:  Strength and sensation are intact Psych: euthymic mood, full affect    Recent Labs: 06/07/2017: Magnesium 2.1 12/11/2017: ALT 25; B Natriuretic Peptide 1,399.0 12/14/2017: Hemoglobin 9.9; Platelets 399 12/15/2017: BUN 35; Creatinine, Ser 1.39; Potassium 3.9; Sodium 135    Lipid Panel Lab Results  Component Value Date   CHOL 281 (H) 06/07/2017   HDL 30 (L) 06/07/2017   LDLCALC UNABLE TO CALCULATE IF TRIGLYCERIDE OVER 400 mg/dL 06/07/2017   TRIG 448 (H) 06/07/2017  Wt Readings from Last 3 Encounters:  01/09/18 158 lb (71.7 kg)  12/15/17 163 lb 9.6 oz (74.2 kg)  12/05/17 175 lb 0.7 oz (79.4 kg)       ASSESSMENT AND PLAN:  Bilateral carotid artery stenosis - Moderate disease, followed with periodic ultrasound Stressed importance of aggressive cholesterol management Goal LDL less than 70  Coronary artery disease involving native heart without angina pectoris, unspecified vessel or lesion type - Denies having any anginal symptoms No further workup at this time  Hyperlipidemia, unspecified hyperlipidemia type - Notes indicating she is on simvastatin Weight trending downward Needs repeat lipid panel in follow-up  Essential hypertension - Plan: EKG 12-Lead, Cardiac event monitor Blood pressure is well controlled on  today's visit. No changes made to the medications.  Cerebrovascular accident (CVA), unspecified mechanism ([place]) - Plan: EKG 12-Lead, Cardiac event monitor  Likely secondary to atrial fibrillation as below On anticoagulation  Paroxysmal atrial fibrillation (HCC) Continue current medications including metoprolol 25 mg twice a day with Cardizem ER 120 daily  Chronic diastolic CHF Goal weight 993 pounds On Lasix 40 daily, extra Lasix after lunch for 3 pound weight gain CHF education provided today  Disposition:   F/U  6 months   Total encounter time more than 25 minutes  Greater than 50% was spent in counseling and coordination of care with the patient    Orders Placed This Encounter  Procedures  . EKG 12-Lead     Signed, Esmond Plants, M.D., Ph.D. 01/09/2018  [place], [place]

## 2018-01-09 ENCOUNTER — Ambulatory Visit (INDEPENDENT_AMBULATORY_CARE_PROVIDER_SITE_OTHER): Payer: Medicare Other | Admitting: Cardiovascular Disease

## 2018-01-09 ENCOUNTER — Encounter: Payer: Self-pay | Admitting: Cardiovascular Disease

## 2018-01-09 VITALS — BP 128/78 | HR 65 | Ht 62.5 in | Wt 158.0 lb

## 2018-01-09 DIAGNOSIS — I480 Paroxysmal atrial fibrillation: Secondary | ICD-10-CM

## 2018-01-09 DIAGNOSIS — I639 Cerebral infarction, unspecified: Secondary | ICD-10-CM | POA: Diagnosis not present

## 2018-01-09 DIAGNOSIS — I5033 Acute on chronic diastolic (congestive) heart failure: Secondary | ICD-10-CM | POA: Diagnosis not present

## 2018-01-09 DIAGNOSIS — I2510 Atherosclerotic heart disease of native coronary artery without angina pectoris: Secondary | ICD-10-CM

## 2018-01-09 DIAGNOSIS — I6523 Occlusion and stenosis of bilateral carotid arteries: Secondary | ICD-10-CM | POA: Diagnosis not present

## 2018-01-09 DIAGNOSIS — E782 Mixed hyperlipidemia: Secondary | ICD-10-CM

## 2018-01-09 DIAGNOSIS — I10 Essential (primary) hypertension: Secondary | ICD-10-CM

## 2018-01-09 MED ORDER — FUROSEMIDE 40 MG PO TABS: 40.0000 mg | ORAL_TABLET | Freq: Every day | ORAL | 3 refills | Status: DC

## 2018-01-09 MED ORDER — LISINOPRIL 40 MG PO TABS: 40.0000 mg | ORAL_TABLET | Freq: Every day | ORAL | 3 refills | Status: DC

## 2018-01-09 MED ORDER — KLOR-CON M10 10 MEQ PO TBCR: 10.0000 meq | EXTENDED_RELEASE_TABLET | Freq: Two times a day (BID) | ORAL | 3 refills | Status: DC

## 2018-01-09 MED ORDER — DILTIAZEM HCL ER COATED BEADS 120 MG PO CP24: 120.0000 mg | ORAL_CAPSULE | Freq: Every day | ORAL | 3 refills | Status: DC

## 2018-01-09 NOTE — Patient Instructions (Addendum)
Medication Instructions:   No medication changes made  Take extra lasix after lunch weight up 3 pounds or more  Weight at home 155 Take extra for weight 158 or higher  Medication Samples have been provided to the patient.  Drug name: Eliquis       Strength: 5 mg        Qty: 2 boxes  LOT: [id number]  Exp.Date: [facility] 2021   Labwork:  No new labs needed  Testing/Procedures:  No further testing at this time   Follow-Up: It was a pleasure seeing you in the office today. Please call us if you have new issues that need to be addressed before your next appt.  [phone]  Your physician wants you to follow-up in: 6 months.  You will receive a reminder letter in the mail two months in advance. If you don't receive a letter, please call our office to schedule the follow-up appointment.  If you need a refill on your cardiac medications before your next appointment, please call your pharmacy.

## 2018-01-14 NOTE — Progress Notes (Deleted)
   Patient ID: DNIYAH GRANT, female    DOB: 11/15/1941, 77 y.o.   MRN: [id number]  HPI  Ms Stahl is a 77 y/o female with a history of  Echo report from 11/29/17 reviewed and showed an EF of 55-60% along with moderate MS and mitral MR. Elevated PA pressure of 50 mm Hg. Cardiac catheterization done September 2005.  Admitted 12/11/17 due to pneumonia and HF. Initially needed IV antibiotics and lasix and then transitioned to oral medications. Cardiology and ID consults obtained. Left sided thoracentesis done with removal of 362ml. Discharged after 4 days. Admitted 12/05/17 due to HF exacerbation. Cardiology and ID consults obtained. Initially needed bipap and transitioned to oxygen by nasal cannula. Developed hyperkalemia with lisinopril and potassium supplements. Treated with kayexalate and discharged after 3 days. Admitted 11/28/17 for a left knee replacement. Given IV fluids and discharged after 3 days.   She presents today for her initial visit with a chief complaint of   Review of Systems    Physical Exam    Assessment & Plan:  1: Chronic heart failure with preserved ejection fraction- - NYHA class - BNP from 12/11/17 reviewed and was 1399.0  2: HTN- - BMP from 12/15/17 reviewed and showed sodium 135, potassium 3.9 and GFR 36  3: Atrial fibrillation- - saw cardiology [name]) 01/04/18

## 2018-01-15 ENCOUNTER — Ambulatory Visit: Payer: Medicare Other | Admitting: Family

## 2018-01-17 ENCOUNTER — Ambulatory Visit (INDEPENDENT_AMBULATORY_CARE_PROVIDER_SITE_OTHER): Payer: Medicare Other | Admitting: Vascular Surgery

## 2018-01-17 ENCOUNTER — Encounter (INDEPENDENT_AMBULATORY_CARE_PROVIDER_SITE_OTHER): Payer: Medicare Other

## 2018-02-12 ENCOUNTER — Ambulatory Visit: Payer: Medicare Other | Admitting: Cardiovascular Disease

## 2018-02-23 ENCOUNTER — Telehealth: Payer: Self-pay | Admitting: Cardiovascular Disease

## 2018-02-23 NOTE — Telephone Encounter (Signed)
Pt c/o Shortness Of Breath: STAT if SOB developed within the last 24 hours or pt is noticeably SOB on the phone  1. Are you currently SOB (can you hear that pt is SOB on the phone)? No, but her PCP has given her oxygen, has had this since a week and a half  2. How long have you been experiencing SOB? 2 weeks  3. Are you SOB when sitting or when up moving around? Up moving around. States her oxygen has been in the 90s, but the last couple days has been in the 80s. Now it is 92  4. Are you currently experiencing any other symptoms? No other symptoms

## 2018-02-23 NOTE — Telephone Encounter (Signed)
I spoke with the patient. She states that her PCP recently put her on home O2 at 2L [place].  She reports that when she was back in the hospital back in December, she was told she had fluid in her lungs and this was drained. She reports that her PCP stated her lungs sounded good, but no other testing was ordered prior to putting her on the O2. She is calling today wanting to see if she can find out why her levels are dropping She denies any fluid retention and she feels as though her heart is in rhythm. Her O2 sats at home have been 92-96 on 2L. She does report waking up one morning and they were in the 80's- question if the canula may have come out of her nose during the night.  Her weight is hold steady at 156-157 lbs.  I advised the patient that I do not feel like she is in any acute distress based off what she is telling me.  She is able to speak without struggling to breathe. I have advised her if her sats drop to the 80's to take slow deep breaths in through her nose, but if over the weekend they drop to the 80's and she cannot get them to come up and she is struggling to breathe, then she will need to report to the ER. Otherwise I will send this to Dr. [name] to review and we will call her back with further recommendations.  She is agreeable.

## 2018-02-25 NOTE — Telephone Encounter (Signed)
Weight is near goal Does not sound like she has fluid buildup Can she walk without oxygen and measure oxygen level? What does she get? I saturations stay high, may not need oxygen during the day, Many people need oxygen at night, might need evaluation by pulmonary for possible sleep study

## 2018-02-26 NOTE — Telephone Encounter (Signed)
Spoke with patient and reviewed Dr. Donivan Scull recommendations. She reports that with ambulation her oxygen levels are in the low 90's with heart rate in the 80's. Recommended that she check with her primary care provider with any further questions regarding her oxygen. She verbalized understanding of our conversation, agreement with plan, and had no further questions.

## 2018-03-08 ENCOUNTER — Encounter: Payer: Self-pay | Admitting: Internal Medicine

## 2018-03-08 ENCOUNTER — Ambulatory Visit (INDEPENDENT_AMBULATORY_CARE_PROVIDER_SITE_OTHER): Payer: Medicare Other | Admitting: Internal Medicine

## 2018-03-08 VITALS — BP 124/78 | HR 64 | Resp 16 | Ht 62.5 in | Wt 159.0 lb

## 2018-03-08 DIAGNOSIS — J90 Pleural effusion, not elsewhere classified: Secondary | ICD-10-CM | POA: Diagnosis not present

## 2018-03-08 DIAGNOSIS — R0609 Other forms of dyspnea: Secondary | ICD-10-CM

## 2018-03-08 NOTE — Progress Notes (Signed)
[facility] Consultation      Assessment and Plan:  Acute on chronic hypoxic respiratory failure, with acute volume overload, pleural effusion, dyspnea on exertion, infiltrates consistent with pneumonia.  Orders Placed This Encounter  Procedures  . DG Chest 2 View  . Pulmonary Function Test [facility] Only   Return in about 3 weeks (around 03/29/2018).    Date: 03/08/2018  MRN# [id number] Caroline [name] Sep 25, 1941  Caroline [name] is a 77 y.o. old female seen in consultation for chief complaint of:    Chief Complaint  Patient presents with  . Consult    Self referral Dr. [name]:  . Shortness of Breath    with and without exertion    HPI:  She sees Dr. [name] for cardiac problems. She had a left knee replacement in December of 2018. She was discharged, and came back a few days later with dyspnea, it was though that she had pneumonia, she was discharged after 4 days, she went back to the ED a few days later, and she had fluid around her lung, and noticed significant improvement.  Her breathing declined again about a month later, and now she is back on oxygen at 2L at all times. She checks her own sats and without oxygen her sats are usually 87-88% and 90s with oxygen.   Before her knee surgery she found that her breathing was never limited according to pt, but according to pt, when they went shopping she would have to stop and sit down, due to dyspnea and weakness.  She has never been diagnosed with breathing problems in the past. She has never been a smoker, but a lot of second hand exposures at work, parents and husband did not smoke.   She has no pets, denies reflux, sinus drainage. Breathing is worse at night when laying flat, she does not have PND.  **Desat walk on oxygen at 2L sat is 92% and HR 62. After walking 360 feet sat dropped to 85% as pt was mouth breathing and not triggering O2, moderate dyspena. After sitting for 20 s and nose breathing her sat  increased to 92% and Hr68  Imaging personally reviewed, chest x-ray 12/15/17, bilateral perihilar infiltrates consistent with pneumonia.  CT chest 12/11/17, bilateral infiltrates with bilateral pleural effusions.  Cardiomegaly, biatrial dilatation, evidence of volume overload with contrast reflux into the IVC and hepatic veins.  PMHX:   Past Medical History:  Diagnosis Date  . Arthritis   . Chronic diastolic CHF (congestive heart failure) ([place])    a. 05/2017 Echo: 65-70%, mild AI, moderate MR, mildly dilated LA; b. 11/2017 Echo: EF 55-60%, no rwma, Gr2 DD, mod MS, mild MR, mod dil LA, PASP 61mmHg.  . CKD (chronic kidney disease), stage IV ([place])   . History of kidney stones 01/2017  . Hypertension   . Hypothyroidism   . Non-obstructive CAD (coronary artery disease)    a. 2005 Cath: LCX 20-14m, otw nl cors.  Marland Kitchen PAF (paroxysmal atrial fibrillation) ([place])    a. On Eliquis; b. CHADS2VASc => 8 (CHF, HTN, age x 2, stroke x 2, vascular disease, female)  . Stroke [facility])    TIA 9/17 & 04/06/2017   Surgical Hx:  Past Surgical History:  Procedure Laterality Date  . ABDOMINAL HYSTERECTOMY    . CARDIAC CATHETERIZATION     Negative   . COLONOSCOPY  2010  . PARTIAL HYSTERECTOMY    . TOTAL KNEE ARTHROPLASTY Left 11/28/2017   Procedure: TOTAL KNEE ARTHROPLASTY;  Surgeon: Thornton Park, MD;  Location: [facility] ORS;  Service: Orthopedics;  Laterality: Left;   Family Hx:  Family History  Problem Relation Age of Onset  . Heart attack Father 85  . Hypertension Father   . Hyperlipidemia Father   . Heart disease Brother        valvular disease    Social Hx:   Social History   Tobacco Use  . Smoking status: Never Smoker  . Smokeless tobacco: Never Used  Substance Use Topics  . Alcohol use: Yes    Alcohol/week: 0.6 oz    Types: 1 Glasses of wine per week    Comment: social   . Drug use: No   Medication:    Current Outpatient Medications:  .  acetaminophen (TYLENOL) 500 MG tablet, Take 1,000  mg by mouth daily as needed (for pain.)., Disp: , Rfl:  .  albuterol (PROVENTIL HFA;VENTOLIN HFA) 108 (90 Base) MCG/ACT inhaler, Inhale 2 puffs into the lungs every 4 (four) hours as needed for wheezing or shortness of breath., Disp: 1 Inhaler, Rfl: 0 .  allopurinol (ZYLOPRIM) 100 MG tablet, Take 100 mg by mouth daily. , Disp: , Rfl:  .  apixaban (ELIQUIS) 5 MG TABS tablet, Take 1 tablet (5 mg total) by mouth 2 (two) times daily., Disp: 180 tablet, Rfl: 4 .  cyanocobalamin (,VITAMIN B-12,) 1000 MCG/ML injection, Inject 1,000 mcg into the muscle every 30 (thirty) days., Disp: , Rfl:  .  diltiazem (CARTIA XT) 120 MG 24 hr capsule, Take 1 capsule (120 mg total) by mouth daily., Disp: 90 capsule, Rfl: 3 .  furosemide (LASIX) 40 MG tablet, Take 1 tablet (40 mg total) by mouth daily., Disp: 90 tablet, Rfl: 3 .  KLOR-CON M10 10 MEQ tablet, Take 1 tablet (10 mEq total) by mouth 2 (two) times daily., Disp: 180 tablet, Rfl: 3 .  levothyroxine (SYNTHROID, LEVOTHROID) 112 MCG tablet, Take 112 mcg by mouth daily before breakfast., Disp: , Rfl:  .  LINIMENTS EX, Apply 1 application topically 3 (three) times daily as needed (for pain). Horse Lininment, Disp: , Rfl:  .  lisinopril (PRINIVIL,ZESTRIL) 40 MG tablet, Take 1 tablet (40 mg total) by mouth daily., Disp: 90 tablet, Rfl: 3 .  metoprolol tartrate (LOPRESSOR) 50 MG tablet, Take 50 mg by mouth 2 (two) times daily., Disp: , Rfl:  .  simvastatin (ZOCOR) 20 MG tablet, Take 20 mg by mouth daily., Disp: , Rfl:    Allergies:  Patient has no known allergies.  Review of Systems: Gen:  Denies  fever, sweats, chills HEENT: Denies blurred vision, double vision. bleeds, sore throat Cvc:  No dizziness, chest pain. Resp:   Denies cough or sputum production, shortness of breath Gi: Denies swallowing difficulty, stomach pain. Gu:  Denies bladder incontinence, burning urine Ext:   No Joint pain, stiffness. Skin: No skin rash,  hives  Endoc:  No polyuria,  polydipsia. Psych: No depression, insomnia. Other:  All other systems were reviewed with the patient and were negative other that what is mentioned in the HPI.   Physical Examination:   VS: BP 124/78 (BP Location: Left Arm, Cuff Size: Normal)   Pulse 64   Resp 16   Ht 5' 2.5" (1.588 m)   Wt 159 lb (72.1 kg)   SpO2 92%   BMI 28.62 kg/m   General Appearance: No distress  Neuro:without focal findings,  speech normal,  HEENT: PERRLA, EOM intact.   Pulmonary: normal breath sounds, No wheezing.  CardiovascularNormal S1,S2.  No m/r/g.   Abdomen: Benign, Soft, non-tender. Renal:  No costovertebral tenderness  GU:  No performed at this time. Endoc: No evident thyromegaly, no signs of acromegaly. Skin:   warm, no rashes, no ecchymosis  Extremities: normal, no cyanosis, clubbing.  Other findings:    LABORATORY PANEL:   CBC No results for input(s): WBC, HGB, HCT, PLT in the last 168 hours. ------------------------------------------------------------------------------------------------------------------  Chemistries  No results for input(s): NA, K, CL, CO2, GLUCOSE, BUN, CREATININE, CALCIUM, MG, AST, ALT, ALKPHOS, BILITOT in the last 168 hours.  Invalid input(s): GFRCGP ------------------------------------------------------------------------------------------------------------------  Cardiac Enzymes No results for input(s): TROPONINI in the last 168 hours. ------------------------------------------------------------  RADIOLOGY:  No results found.     Thank  you for the consultation and for allowing [facility] to assist in the care of your patient. Our recommendations are noted above.  Please contact us if we can be of further service.   Marda Stalker, MD.  Board Certified in Internal Medicine, Pulmonary Medicine, [place], and Sleep Medicine.  [facility] Office Number: [phone]  Patricia Pesa, M.D.   Merton Border, M.D  03/08/2018

## 2018-03-08 NOTE — Patient Instructions (Signed)
Will send for lung function test and CXR.

## 2018-03-20 ENCOUNTER — Ambulatory Visit (HOSPITAL_COMMUNITY): Payer: Medicare Other

## 2018-03-20 ENCOUNTER — Ambulatory Visit
Admission: RE | Admit: 2018-03-20 | Discharge: 2018-03-20 | Disposition: A | Discharge status: Home / Self Care | Payer: Medicare Other | Source: Ambulatory Visit | Attending: Internal Medicine | Admitting: Internal Medicine

## 2018-03-20 ENCOUNTER — Telehealth: Payer: Self-pay | Admitting: Cardiovascular Disease

## 2018-03-20 DIAGNOSIS — I509 Heart failure, unspecified: Secondary | ICD-10-CM | POA: Diagnosis not present

## 2018-03-20 DIAGNOSIS — J90 Pleural effusion, not elsewhere classified: Secondary | ICD-10-CM

## 2018-03-20 DIAGNOSIS — R0600 Dyspnea, unspecified: Secondary | ICD-10-CM | POA: Diagnosis present

## 2018-03-20 DIAGNOSIS — I700 Atherosclerosis of aorta: Secondary | ICD-10-CM | POA: Diagnosis not present

## 2018-03-20 DIAGNOSIS — R942 Abnormal results of pulmonary function studies: Secondary | ICD-10-CM | POA: Diagnosis not present

## 2018-03-20 MED ORDER — ALBUTEROL SULFATE (2.5 MG/3ML) 0.083% IN NEBU
2.5000 mg | INHALATION_SOLUTION | Freq: Once | RESPIRATORY_TRACT | Status: AC
  Filled 2018-03-20: qty 3

## 2018-03-20 NOTE — Telephone Encounter (Signed)
Called patient. She states she is almost out of her Eliquis. She is going to have to meet her around $450.00 deductible before her Eliquis prescription will decrease in expense. She's already spent around $200 of this amount. Encouraged patient to proceed with finding out from her pharmacy how much she will owe for a 30 or 90-day prescription refill and then how much it would be after that. Gave patient the 1-855-ELIQUIS number to call for patient assistance information. We discussed that it may be necessary for her to pay the deductible in order to qualify for patient assistance and then achieve the monthly payment she needs.  Explained we are not able to supply ongoing samples and she was understanding. Advised patient to call pharmacy and insurance to see how much more of the deductible she owes and how much she will be responsible for in the future. Advised patient to call us back for further assistance if needed. She verbalized understanding of the importance of taking her Eliquis. One box of samples left at from desk for pick up.   Medication Samples have been provided to the patient.  Drug name: Eliquis       Strength: 5 mg        Qty: 1 box  LOT: [id number]  Exp.Date: Jun 2021

## 2018-03-20 NOTE — Telephone Encounter (Signed)
Pt would like samples of Eliquis 

## 2018-03-27 ENCOUNTER — Ambulatory Visit: Payer: Medicare Other

## 2018-03-28 NOTE — Progress Notes (Signed)
[facility] Consultation      Assessment and Plan:  Acute on chronic hypoxic respiratory failure, with acute volume overload, pleural effusion, dyspnea on exertion, infiltrates consistent with pneumonia which is now resolved. Debility/deconditioning. -Discussed today that would like her to try to increase her activity level, try to get back to her normal level activity before her current issues developed. -Continue to try to increase her activity, check her oxygen saturation, if her oxygen saturation is above 90% she is advised that she does not need to use supplemental oxygen. - Continue to use oxygen 2 L at night for the time being.   Orders Placed This Encounter  Procedures  . DG Chest 2 View   Return in about 3 months (around 06/28/2018).    Date: 03/28/2018  MRN# [id number] Caroline [name] [birth year]  Caroline [name] is a 77 y.o. old female seen in consultation for chief complaint of:    Chief Complaint  Patient presents with  . Follow-up    denies any symptoms    HPI:  The patient is a 77 year old female, she had a knee replacement in December 2018, since that time has had recurrent visits to the hospital due to dyspnea,, complicated by deconditioning/debility, oxygen dependence. Today she notes that she is feeling better, her breathing has improved, she can walk around her house without difficulty. She walked in from the parking lot without difficulty, her sat was 95% on no oxygen today after walking in.  She has an inhaler she has not had to use it in a month.  She checks her sat at home without the oxygen and it usually runs around 90's, she wears oxygen if it goes below 90%. This has been getting progressively better, she still wears the O2 most of the day and at night.   She has no pets, denies reflux, sinus drainage. Breathing is worse at night when laying flat, she does not have PND.  **,Tracings personally reviewed 03/20/18; FVC was 47% predicted,  this improved to 55% predicted the bronchodilator, FEV1 was 62% predicted without improvement.  Pressure was 98%.  Forced expiratory time was maximum of 4.5 seconds.  This indicates possible restriction though this test is suboptimal.  TLC 66%, vital capacity 61%.  Ratio is normal.  Diffusion capacity is mildly reduced at 65%. -Overall this test suggests moderate restriction, possible chronic bronchitis, however the lack of adequate expiratory time mix is a suboptimal test. **Imaging personally reviewed, chest x-ray 03/20/18 shows severe changes of chronic bronchitis, small bilateral pleural effusions.  In comparison with previous the large pleural effusions and the bilateral pneumonia changes appear to have resolved.   Chest x-ray 12/15/17, bilateral perihilar infiltrates consistent with   CT chest 12/11/17, bilateral infiltrates with bilateral pleural effusions.  Cardiomegaly, biatrial dilatation, evidence of volume overload with contrast reflux into the IVC and hepatic veins.  **Desat walk on oxygen at 2L sat is 92% and HR 62. After walking 360 feet sat dropped to 85% as pt was mouth breathing and not triggering O2, moderate dyspena. After sitting for 20 s and nose breathing her sat increased to 92% and Hr68  Medication:    Current Outpatient Medications:  .  acetaminophen (TYLENOL) 500 MG tablet, Take 1,000 mg by mouth daily as needed (for pain.)., Disp: , Rfl:  .  albuterol (PROVENTIL HFA;VENTOLIN HFA) 108 (90 Base) MCG/ACT inhaler, Inhale 2 puffs into the lungs every 4 (four) hours as needed for wheezing or shortness of breath., Disp: 1  Inhaler, Rfl: 0 .  allopurinol (ZYLOPRIM) 100 MG tablet, Take 100 mg by mouth daily. , Disp: , Rfl:  .  apixaban (ELIQUIS) 5 MG TABS tablet, Take 1 tablet (5 mg total) by mouth 2 (two) times daily., Disp: 180 tablet, Rfl: 4 .  cyanocobalamin (,VITAMIN B-12,) 1000 MCG/ML injection, Inject 1,000 mcg into the muscle every 30 (thirty) days., Disp: , Rfl:  .   diltiazem (CARTIA XT) 120 MG 24 hr capsule, Take 1 capsule (120 mg total) by mouth daily., Disp: 90 capsule, Rfl: 3 .  furosemide (LASIX) 40 MG tablet, Take 1 tablet (40 mg total) by mouth daily., Disp: 90 tablet, Rfl: 3 .  KLOR-CON M10 10 MEQ tablet, Take 1 tablet (10 mEq total) by mouth 2 (two) times daily., Disp: 180 tablet, Rfl: 3 .  levothyroxine (SYNTHROID, LEVOTHROID) 112 MCG tablet, Take 112 mcg by mouth daily before breakfast., Disp: , Rfl:  .  LINIMENTS EX, Apply 1 application topically 3 (three) times daily as needed (for pain). Horse Lininment, Disp: , Rfl:  .  lisinopril (PRINIVIL,ZESTRIL) 40 MG tablet, Take 1 tablet (40 mg total) by mouth daily., Disp: 90 tablet, Rfl: 3 .  metoprolol tartrate (LOPRESSOR) 50 MG tablet, Take 50 mg by mouth 2 (two) times daily., Disp: , Rfl:  .  simvastatin (ZOCOR) 20 MG tablet, Take 20 mg by mouth daily., Disp: , Rfl:  No current facility-administered medications for this visit.   Facility-Administered Medications Ordered in Other Visits:  .  albuterol (PROVENTIL) (2.5 MG/3ML) 0.083% nebulizer solution 2.5 mg, 2.5 mg, Nebulization, Once, Laverle Hobby, MD   Allergies:  Patient has no known allergies.  Review of Systems: Gen:  Denies  fever, sweats, chills HEENT: Denies blurred vision, double vision. bleeds, sore throat Cvc:  No dizziness, chest pain. Resp:   Denies cough or sputum production. Gi: Denies swallowing difficulty, stomach pain. Gu:  Denies bladder incontinence, burning urine Ext:   No Joint pain, stiffness. Skin: No skin rash,  hives  Endoc:  No polyuria, polydipsia. Psych: No depression, insomnia. Other:  All other systems were reviewed with the patient and were negative other that what is mentioned in the HPI.   Physical Examination:   VS: BP 122/78 (BP Location: Left Arm, Cuff Size: Normal)   Pulse (!) 59   Ht 5' 2.5" (1.588 m)   Wt 153 lb (69.4 kg)   SpO2 95%   BMI 27.54 kg/m   General Appearance: No distress   Neuro:without focal findings,  speech normal,  HEENT: PERRLA, EOM intact.   Pulmonary: normal breath sounds, No wheezing.  CardiovascularNormal S1,S2.  No m/r/g.   Abdomen: Benign, Soft, non-tender. Renal:  No costovertebral tenderness  GU:  No performed at this time. Endoc: No evident thyromegaly, no signs of acromegaly. Skin:   warm, no rashes, no ecchymosis  Extremities: normal, no cyanosis, clubbing.  Other findings:    LABORATORY PANEL:   CBC No results for input(s): WBC, HGB, HCT, PLT in the last 168 hours. ------------------------------------------------------------------------------------------------------------------  Chemistries  No results for input(s): NA, K, CL, CO2, GLUCOSE, BUN, CREATININE, CALCIUM, MG, AST, ALT, ALKPHOS, BILITOT in the last 168 hours.  Invalid input(s): GFRCGP ------------------------------------------------------------------------------------------------------------------  Cardiac Enzymes No results for input(s): TROPONINI in the last 168 hours. ------------------------------------------------------------  RADIOLOGY:  No results found.     Thank  you for the consultation and for allowing [facility] to assist in the care of your patient. Our recommendations are noted above.  Please contact  us if we can be of further service.   Marda Stalker, MD.  Board Certified in Internal Medicine, Pulmonary Medicine, [place], and Sleep Medicine.  [facility] Office Number: [phone]  Patricia Pesa, M.D.  Merton Border, M.D

## 2018-03-29 ENCOUNTER — Ambulatory Visit (INDEPENDENT_AMBULATORY_CARE_PROVIDER_SITE_OTHER): Payer: Medicare Other | Admitting: Internal Medicine

## 2018-03-29 ENCOUNTER — Encounter: Payer: Self-pay | Admitting: Internal Medicine

## 2018-03-29 VITALS — BP 122/78 | HR 59 | Ht 62.5 in | Wt 153.0 lb

## 2018-03-29 DIAGNOSIS — R0609 Other forms of dyspnea: Secondary | ICD-10-CM

## 2018-03-29 NOTE — Patient Instructions (Addendum)
Try to wean oxygen during the day, use it only if your oxygen saturation drops below 90%.  Start increasing her activity level, getting outside the house and walking more.  You can take your option with you when leaving the house, but she did not need to use it if your oxygen saturation is normal (above 90%). Continue to use 2L oxygen at night.   We will repeat chest x-ray in 3 months, and see her back after that.

## 2018-06-27 NOTE — Progress Notes (Deleted)
[facility] Consultation      Assessment and Plan:  Acute on chronic hypoxic respiratory failure, with acute volume overload, pleural effusion, dyspnea on exertion, infiltrates consistent with pneumonia which is now resolved. Debility/deconditioning. -Discussed today that would like her to try to increase her activity level, try to get back to her normal level activity before her current issues developed. -Continue to try to increase her activity, check her oxygen saturation, if her oxygen saturation is above 90% she is advised that she does not need to use supplemental oxygen. - Continue to use oxygen 2 L at night for the time being.   No orders of the defined types were placed in this encounter.  No follow-ups on file.    Date: 06/27/2018  MRN# [id number] Caroline [name] [birth year]  ZAKYIA GAGAN is a 77 y.o. old female seen in consultation for chief complaint of:    No chief complaint on file.   HPI:  The patient is a 77 year old female, she had a knee replacement in December 4098, complicated by deconditioning/debility, oxygen dependence.  At last visit she was encouraged to try to increase her activity level.  She was advised to use oxygen 2 L at night, but not to use it if her oxygen saturation was above 90%. She has no pets, denies reflux, sinus drainage. Breathing is worse at night when laying flat, she does not have PND.  **PFT 03/20/18; FVC was 47% predicted, this improved to 55% predicted the bronchodilator, FEV1 was 62% predicted without improvement.  Pressure was 98%.  Forced expiratory time was maximum of 4.5 seconds.  This indicates possible restriction though this test is suboptimal.  TLC 66%, vital capacity 61%.  Ratio is normal.  Diffusion capacity is mildly reduced at 65%. -Overall this test suggests moderate restriction, possible chronic bronchitis, however the lack of adequate expiratory time makes is a suboptimal test. **chest x-ray 03/20/18  shows severe changes of chronic bronchitis, small bilateral pleural effusions.  In comparison with previous the large pleural effusions and the bilateral pneumonia changes appear to have resolved.   **Chest x-ray 12/15/17, bilateral perihilar infiltrates consistent with  **CT chest 12/11/17, bilateral infiltrates with bilateral pleural effusions.  Cardiomegaly, biatrial dilatation, evidence of volume overload with contrast reflux into the IVC and hepatic veins. **Desat walk on oxygen at 2L sat is 92% and HR 62. After walking 360 feet sat dropped to 85% as pt was mouth breathing and not triggering O2, moderate dyspena. After sitting for 20 s and nose breathing her sat increased to 92% and Hr68  Medication:    Current Outpatient Medications:  .  acetaminophen (TYLENOL) 500 MG tablet, Take 1,000 mg by mouth daily as needed (for pain.)., Disp: , Rfl:  .  albuterol (PROVENTIL HFA;VENTOLIN HFA) 108 (90 Base) MCG/ACT inhaler, Inhale 2 puffs into the lungs every 4 (four) hours as needed for wheezing or shortness of breath., Disp: 1 Inhaler, Rfl: 0 .  allopurinol (ZYLOPRIM) 100 MG tablet, Take 100 mg by mouth daily. , Disp: , Rfl:  .  apixaban (ELIQUIS) 5 MG TABS tablet, Take 1 tablet (5 mg total) by mouth 2 (two) times daily., Disp: 180 tablet, Rfl: 4 .  cyanocobalamin (,VITAMIN B-12,) 1000 MCG/ML injection, Inject 1,000 mcg into the muscle every 30 (thirty) days., Disp: , Rfl:  .  diltiazem (CARTIA XT) 120 MG 24 hr capsule, Take 1 capsule (120 mg total) by mouth daily., Disp: 90 capsule, Rfl: 3 .  furosemide (LASIX) 40 MG  tablet, Take 1 tablet (40 mg total) by mouth daily., Disp: 90 tablet, Rfl: 3 .  KLOR-CON M10 10 MEQ tablet, Take 1 tablet (10 mEq total) by mouth 2 (two) times daily., Disp: 180 tablet, Rfl: 3 .  levothyroxine (SYNTHROID, LEVOTHROID) 112 MCG tablet, Take 112 mcg by mouth daily before breakfast., Disp: , Rfl:  .  LINIMENTS EX, Apply 1 application topically 3 (three) times daily as needed  (for pain). Horse Lininment, Disp: , Rfl:  .  lisinopril (PRINIVIL,ZESTRIL) 40 MG tablet, Take 1 tablet (40 mg total) by mouth daily., Disp: 90 tablet, Rfl: 3 .  metoprolol tartrate (LOPRESSOR) 50 MG tablet, Take 50 mg by mouth 2 (two) times daily., Disp: , Rfl:  .  simvastatin (ZOCOR) 20 MG tablet, Take 20 mg by mouth daily., Disp: , Rfl:  No current facility-administered medications for this visit.   Facility-Administered Medications Ordered in Other Visits:  .  albuterol (PROVENTIL) (2.5 MG/3ML) 0.083% nebulizer solution 2.5 mg, 2.5 mg, Nebulization, Once, Laverle Hobby, MD   Allergies:  Patient has no known allergies.  Review of Systems: Gen:  Denies  fever, sweats, chills HEENT: Denies blurred vision, double vision. bleeds, sore throat Cvc:  No dizziness, chest pain. Resp:   Denies cough or sputum production. Gi: Denies swallowing difficulty, stomach pain. Gu:  Denies bladder incontinence, burning urine Ext:   No Joint pain, stiffness. Skin: No skin rash,  hives  Endoc:  No polyuria, polydipsia. Psych: No depression, insomnia. Other:  All other systems were reviewed with the patient and were negative other that what is mentioned in the HPI.   Physical Examination:   VS: There were no vitals taken for this visit.  General Appearance: No distress  Neuro:without focal findings,  speech normal,  HEENT: PERRLA, EOM intact.   Pulmonary: normal breath sounds, No wheezing.  CardiovascularNormal S1,S2.  No m/r/g.   Abdomen: Benign, Soft, non-tender. Renal:  No costovertebral tenderness  GU:  No performed at this time. Endoc: No evident thyromegaly, no signs of acromegaly. Skin:   warm, no rashes, no ecchymosis  Extremities: normal, no cyanosis, clubbing.  Other findings:    LABORATORY PANEL:   CBC No results for input(s): WBC, HGB, HCT, PLT in the last 168  hours. ------------------------------------------------------------------------------------------------------------------  Chemistries  No results for input(s): NA, K, CL, CO2, GLUCOSE, BUN, CREATININE, CALCIUM, MG, AST, ALT, ALKPHOS, BILITOT in the last 168 hours.  Invalid input(s): GFRCGP ------------------------------------------------------------------------------------------------------------------  Cardiac Enzymes No results for input(s): TROPONINI in the last 168 hours. ------------------------------------------------------------  RADIOLOGY:  No results found.     Thank  you for the consultation and for allowing [facility] to assist in the care of your patient. Our recommendations are noted above.  Please contact us if we can be of further service.  Marda Stalker, M.D., F.C.C.P.  Board Certified in Internal Medicine, Pulmonary Medicine, [place], and Sleep Medicine.  [facility] Office Number: [phone]

## 2018-07-02 ENCOUNTER — Ambulatory Visit: Payer: Medicare Other | Admitting: Internal Medicine

## 2018-07-07 DIAGNOSIS — I5033 Acute on chronic diastolic (congestive) heart failure: Secondary | ICD-10-CM | POA: Insufficient documentation

## 2018-07-07 NOTE — Progress Notes (Signed)
Cardiology Office Note  Date:  07/09/2018   ID:  Caroline [name], Caroline [name] [birth year], MRN [id number]  PCP:  Remi Haggard, FNP   Chief Complaint  Patient presents with  . other    6 month follow up. Meds reviewed by the pt. verbally. "doing well."     HPI:  Caroline [name] is a 77 year old woman with history of  Paroxysmal atrial fibrillation hypertension,  hyperlipidemia,  moderate Bilateral carotid disease   minimal coronary artery disease on catheterization in September 2005  Chronic renal insufficiency, creatinine of 2 Mild to Moderate mitral valve stenosis Who presents by referral for CVA x2, arrhythmia, paroxysmal atrial fibrillation, diastolic CHF  In follow-up today she reports that she feels tired No regular exercise program Weight is stable, no leg edema Reports her sleep is okay Does not think she is having much atrial fibrillation, wonders if she can stop the diltiazem Previously asymptomatic when in atrial fibrillation based on previous EKGs and event monitors Previous event monitor reviewed with her  Previously on Crestor Now on simvastatin 20 mgrams daily Reports cholesterol running high  Weight stable labwork done through primary care No lab work available for review but was done several months ago Takes Lasix 40 daily with over-the-counter potassium  EKG personally reviewed by myself on todays visit Shows normal sinus rhythm with rate 60 bpm no significant ST or T-wave changes  Other past medical history reviewed hospital December 2018 acute respiratory failure  Recent discharge after left knee total replacement  Readmitted with acute respiratory failure concerning for pneumonia, diastolic CHF Treated with antibiotics Had diuresis Found to have moderate mitral valve stenosis Paroxysmal atrial fibrillation maintained in sinus rhythm  prior office visit was in atrial fibrillation on EKG,  started on metoprolol and anticoagulation, eliquis Given a 30 day  monitor. Initially showed episodes of paroxysmal atrial fibrillation Based on this we started her on Cardizem 120 mg daily , stopped amlodipine Clonidine held for bradycardia hypotension  Other past medical history reviewed carotidduplex with 40-59% ICA stenosis bilaterally old TIA on 09/24/2016,  HLD, total chol 280. She took herself off statins in the past  TIA 04/06/2017, lasted 10 min presented to the ED with a chief complaint of slurred speech and right-sided weakness. Symptoms  Resolved.  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   Previously scheduled to have surgery with Dr. Jamal Collin LAPAROSCOPIC CHOLECYSTECTOMY WITH INTRAOPERATIVE CHOLANGIOGRAM This seems to have been canceled given TIA symptoms  By report she was at primary care recently and noted to have irregular rhythm  Prior echocardiogram January 2014 showed normal LV function with ejection fraction 69%, mild TR The report does mention at least mild to moderate regurg, mild TR  Cardiac catheterization September 2005 showing 20-30% mid left circumflex disease, ejection fraction 60%, otherwise normal study   PMH:   has a past medical history of Arthritis, Chronic diastolic CHF (congestive heart failure) ([place]), CKD (chronic kidney disease), stage IV ([place]), History of kidney stones (01/2017), Hypertension, Hypothyroidism, Non-obstructive CAD (coronary artery disease), PAF (paroxysmal atrial fibrillation) ([place]), and Stroke ([place]).  PSH:    Past Surgical History:  Procedure Laterality Date  . ABDOMINAL HYSTERECTOMY    . CARDIAC CATHETERIZATION     Negative   . COLONOSCOPY  2010  . PARTIAL HYSTERECTOMY    . TOTAL KNEE ARTHROPLASTY Left 11/28/2017   Procedure: TOTAL KNEE ARTHROPLASTY;  Surgeon: Thornton Park, MD;  Location: [facility] ORS;  Service: Orthopedics;  Laterality: Left;    Current  Outpatient Medications  Medication Sig Dispense Refill  . acetaminophen (TYLENOL) 500 MG tablet Take 1,000 mg by mouth  daily as needed (for pain.).    Marland Kitchen allopurinol (ZYLOPRIM) 100 MG tablet Take 100 mg by mouth daily.     Marland Kitchen apixaban (ELIQUIS) 5 MG TABS tablet Take 1 tablet (5 mg total) by mouth 2 (two) times daily. 180 tablet 4  . cyanocobalamin (,VITAMIN B-12,) 1000 MCG/ML injection Inject 1,000 mcg into the muscle every 30 (thirty) days.    Marland Kitchen diltiazem (CARTIA XT) 120 MG 24 hr capsule Take 1 capsule (120 mg total) by mouth daily. 90 capsule 3  . furosemide (LASIX) 40 MG tablet Take 1 tablet (40 mg total) by mouth daily. 90 tablet 3  . KLOR-CON M10 10 MEQ tablet Take 1 tablet (10 mEq total) by mouth 2 (two) times daily. 180 tablet 3  . levothyroxine (SYNTHROID, LEVOTHROID) 112 MCG tablet Take 112 mcg by mouth daily before breakfast.    . LINIMENTS EX Apply 1 application topically 3 (three) times daily as needed (for pain). Horse Lininment    . lisinopril (PRINIVIL,ZESTRIL) 40 MG tablet Take 1 tablet (40 mg total) by mouth daily. 90 tablet 3  . metoprolol tartrate (LOPRESSOR) 25 MG tablet Take 1 tablet (25 mg total) by mouth 2 (two) times daily. 180 tablet 3  . simvastatin (ZOCOR) 20 MG tablet Take 20 mg by mouth daily.    Marland Kitchen ezetimibe (ZETIA) 10 MG tablet Take 1 tablet (10 mg total) by mouth daily. 90 tablet 3   No current facility-administered medications for this visit.    Facility-Administered Medications Ordered in Other Visits  Medication Dose Route Frequency Provider Last Rate Last Dose  . albuterol (PROVENTIL) (2.5 MG/3ML) 0.083% nebulizer solution 2.5 mg  2.5 mg Nebulization Once Laverle Hobby, MD         Allergies:   Patient has no known allergies.   Social History:  The patient  reports that she has never smoked. She has never used smokeless tobacco. She reports that she drinks about 0.6 oz of alcohol per week. She reports that she does not use drugs.   Family History:   family history includes Heart attack (age of onset: 39) in her father; Heart disease in her brother; Hyperlipidemia in  her father; Hypertension in her father.    Review of Systems: Review of Systems  Constitutional: Positive for malaise/fatigue.  Respiratory: Negative.   Cardiovascular: Negative.   Gastrointestinal: Negative.   Musculoskeletal: Negative.   Neurological: Negative.        Slurred speech, right-sided weakness, resolved  Psychiatric/Behavioral: Negative.   All other systems reviewed and are negative.    PHYSICAL EXAM: VS:  BP 140/80 (BP Location: Left Arm, Patient Position: Sitting, Cuff Size: Normal)   Pulse 60   Ht 5' 2.5" (1.588 m)   Wt 153 lb 12 oz (69.7 kg)   BMI 27.67 kg/m  , BMI Body mass index is 27.67 kg/m. Constitutional:  oriented to person, place, and time. No distress.  HENT:  Head: Normocephalic and atraumatic.  Eyes:  no discharge. No scleral icterus.  Neck: Normal range of motion. Neck supple. No JVD present.  Cardiovascular: Normal rate, regular rhythm, normal heart sounds and intact distal pulses. Exam reveals no gallop and no friction rub. No edema No murmur heard. Pulmonary/Chest: Effort normal and breath sounds normal. No stridor. No respiratory distress.  no wheezes.  no rales.  no tenderness.  Abdominal: Soft.  no distension.  no tenderness.  Musculoskeletal: Normal range of motion.  no  tenderness or deformity.  Neurological:  normal muscle tone. Coordination normal. No atrophy Skin: Skin is warm and dry. No rash noted. not diaphoretic.  Psychiatric:  normal mood and affect. behavior is normal. Thought content normal.     Recent Labs: 12/11/2017: ALT 25; B Natriuretic Peptide 1,399.0 12/14/2017: Hemoglobin 9.9; Platelets 399 12/15/2017: BUN 35; Creatinine, Ser 1.39; Potassium 3.9; Sodium 135    Lipid Panel Lab Results  Component Value Date   CHOL 281 (H) 06/07/2017   HDL 30 (L) 06/07/2017   LDLCALC UNABLE TO CALCULATE IF TRIGLYCERIDE OVER 400 mg/dL 06/07/2017   TRIG 448 (H) 06/07/2017      Wt Readings from Last 3 Encounters:  07/09/18 153  lb 12 oz (69.7 kg)  03/29/18 153 lb (69.4 kg)  03/08/18 159 lb (72.1 kg)       ASSESSMENT AND PLAN:  Bilateral carotid artery stenosis - Moderate disease, followed with periodic ultrasound Stressed importance of aggressive cholesterol management Goal LDL less than 70 Recommended she follow vein and vascular for repeat ultrasound  Coronary artery disease involving native heart without angina pectoris, unspecified vessel or lesion type - Denies having any anginal symptoms No further workup at this time ecommended regular exercise program  Hyperlipidemia, unspecified hyperlipidemia type - Notes indicating she is on simvastatin We have requested lipid panel from primary care Goal LDL less than 70 Recommended she add Zetia 10 mg daily Stay on statin  Essential hypertension - Plan: EKG 12-Lead, Cardiac event monitor Blood pressure is well controlled on today's visit. No changes made to the medications. stable  Cerebrovascular accident (CVA), unspecified mechanism ([place]) - Plan: EKG 12-Lead, Cardiac event monitor  Likely secondary to atrial fibrillation as below On anticoagulation, no recent TIA or stroke symptoms  Paroxysmal atrial fibrillation ([place]) Reports having fatigue Normal sinus rhythm on today's visit We'll decrease metoprolol down to 25 twice a day  Chronic diastolic CHF Appears euvolemic Lab work has been requested from primary care  Disposition:   F/U  12 months   Total encounter time more than 25 minutes  Greater than 50% was spent in counseling and coordination of care with the patient    Orders Placed This Encounter  Procedures  . EKG 12-Lead     Signed, Esmond Plants, M.D., Ph.D. 07/09/2018  [place], [place]

## 2018-07-09 ENCOUNTER — Ambulatory Visit (INDEPENDENT_AMBULATORY_CARE_PROVIDER_SITE_OTHER): Payer: Medicare Other | Admitting: Cardiovascular Disease

## 2018-07-09 ENCOUNTER — Encounter: Payer: Self-pay | Admitting: Cardiovascular Disease

## 2018-07-09 VITALS — BP 140/80 | HR 60 | Ht 62.5 in | Wt 153.8 lb

## 2018-07-09 DIAGNOSIS — E782 Mixed hyperlipidemia: Secondary | ICD-10-CM | POA: Diagnosis not present

## 2018-07-09 DIAGNOSIS — I639 Cerebral infarction, unspecified: Secondary | ICD-10-CM | POA: Diagnosis not present

## 2018-07-09 DIAGNOSIS — I5033 Acute on chronic diastolic (congestive) heart failure: Secondary | ICD-10-CM | POA: Diagnosis not present

## 2018-07-09 DIAGNOSIS — I480 Paroxysmal atrial fibrillation: Secondary | ICD-10-CM | POA: Diagnosis not present

## 2018-07-09 DIAGNOSIS — I2510 Atherosclerotic heart disease of native coronary artery without angina pectoris: Secondary | ICD-10-CM | POA: Diagnosis not present

## 2018-07-09 DIAGNOSIS — I6523 Occlusion and stenosis of bilateral carotid arteries: Secondary | ICD-10-CM

## 2018-07-09 DIAGNOSIS — I10 Essential (primary) hypertension: Secondary | ICD-10-CM | POA: Diagnosis not present

## 2018-07-09 MED ORDER — EZETIMIBE 10 MG PO TABS: 10.0000 mg | ORAL_TABLET | Freq: Every day | ORAL | 3 refills | Status: DC

## 2018-07-09 MED ORDER — METOPROLOL TARTRATE 25 MG PO TABS: 25.0000 mg | ORAL_TABLET | Freq: Two times a day (BID) | ORAL | 3 refills | Status: DC

## 2018-07-09 NOTE — Patient Instructions (Addendum)
We will request labs from PMD   Medication Instructions:   Please decrease the metoprolol down to 25 mg twice a day  Try the zetia one pill a day, for cholesterol  Labwork:  No new labs needed  Testing/Procedures:  No further testing at this time   Follow-Up: It was a pleasure seeing you in the office today. Please call us if you have new issues that need to be addressed before your next appt.  [phone]  Your physician wants you to follow-up in: 12 months.  You will receive a reminder letter in the mail two months in advance. If you don't receive a letter, please call our office to schedule the follow-up appointment.  If you need a refill on your cardiac medications before your next appointment, please call your pharmacy.  For educational health videos Log in to : [email or web address] Or : [email or web address], password : [email or web address]

## 2018-08-28 ENCOUNTER — Telehealth: Payer: Self-pay | Admitting: Cardiovascular Disease

## 2018-08-28 NOTE — Telephone Encounter (Signed)
Pt c/o medication issue:  1. Name of Medication: Eliquis   2. How are you currently taking this medication (dosage and times per day)? 5 mg po BID   3. Are you having a reaction (difficulty breathing--STAT)? Not sure   4. What is your medication issue?  Saturday morning woke up wit excessive bleeding  And blood clots in mouth with unknown source of bleeding this has not happened since and husband is concerned she may need to have her pt inr checked   Please call

## 2018-08-28 NOTE — Telephone Encounter (Signed)
Woke up Saturday morning. Had blood on her mouth and on her pillow case.  When she pulled it out, it was dark blood.  Has not bleed anymore since then. Sometimes she chews gum during the night but recalls she did not have any gum that night.   Advised patient to continue on medication and give [place] a call if this happens again.  Patient asked me to speak to her husband as well.  He said he is concerned that something could be going on his her lungs. He wondered if she should have a blood test to check PT/INR. Advise this lab work does not work and is not necessary to do when taking Eliquis. Advised him that he should call PCP or pulmonologist if additional concerns. At this time, patient does not have any bleeding and should continue Eliquis. He then asked if he could call me back in a few minutes because he was at an event somewhere and difficult to hear.

## 2018-08-28 NOTE — Telephone Encounter (Signed)
Patient's husband calling back.  He verbalized understanding to follow up with PCP or pulmonology for further evaluation. He was agreeable and will call PCP.

## 2018-08-29 ENCOUNTER — Ambulatory Visit
Admission: RE | Admit: 2018-08-29 | Discharge: 2018-08-29 | Disposition: A | Discharge status: Home / Self Care | Payer: Medicare Other | Source: Ambulatory Visit | Attending: Internal Medicine | Admitting: Internal Medicine

## 2018-08-29 DIAGNOSIS — I517 Cardiomegaly: Secondary | ICD-10-CM | POA: Diagnosis not present

## 2018-08-29 DIAGNOSIS — R0609 Other forms of dyspnea: Principal | ICD-10-CM

## 2018-08-30 NOTE — Progress Notes (Signed)
[facility] Consultation      Assessment and Plan:  Chronic respiratory with chronic bronchitis. Debility/deconditioning. -Continue to try to increase her activity, check her oxygen saturation, if her oxygen saturation is above 90% she is advised that she does not need to use supplemental oxygen. - no longer on oxygen.   Oral bleeding. - Patient had a single episode of oral bleeding, uncertain etiology. - No signs of GI bleeding such as hematemesis, melena, stomach pain.  Patient does not take NSAIDs.  No signs of hemoptysis, cough, dyspnea, wheezing or any other respiratory symptoms. - No signs of oral or nasal bleeding/trauma. -Discussed that I suspect that she may have bitten herself while sleeping or had some other sinus or oral issue that was exacerbated by Eliquis.  This seems to have been a one-time event.  We will check blood work to provide reassurance and ensure no abnormalities.  Discussed that we will continue Eliquis for the time being, however if this occurs again we may have to discuss cessation of Eliquis.    Return in about 6 months (around 03/01/2019).   Orders Placed This Encounter  Procedures  . Comp Met (CMET)  . INR/PT       Date: 08/30/2018  MRN# [id number] Caroline [name] Nov 15, 1941  Caroline [name] is a 77 y.o. old female seen in consultation for chief complaint of:    Chief Complaint  Patient presents with  . Shortness of Breath    sob has resolved. Pt says she awaken Sat morning with blood everywhere.    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 She was encouraged to try to increase her activity.  Today she notes that this past week she awoke in the morning, she had dark blood in her mouth. 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 She takes it regularly, did not take an extra dose. When  she woke in the AM, the bleeding had stopped but it was alarming, it went through the sheets and matress. She had several clots in her mouth. She gargled and cleaned her mouth and no site was found.  She had taken no new medications other her regular medications. She takes tylenol 2 extra strength tylenols about every other day.  No hemoptysis, no GI bleeding.   She is no longer on oxygen. She checks her sats daily on RA and is always above 90%.   She has no pets, denies reflux, sinus drainage. Breathing is worse at night when laying flat, she does not have PND.  **CXR 09/08/18>> images personally reviewed; changes of chronic bronchitis, otherwise unremarkable. Previous LLL pneumonia/effusion resolved.  **PFT 03/20/18>> FVC was 47% predicted, this improved to 55% predicted the bronchodilator, FEV1 was 62% predicted without improvement.  Pressure was 98%.  Forced expiratory time was maximum of 4.5 seconds.  This indicates possible restriction though this test is suboptimal.  TLC 66%, vital capacity 61%.  Ratio is normal.  Diffusion capacity is mildly reduced at 65%. -Overall this test suggests moderate restriction, possible chronic bronchitis, however the lack of adequate expiratory time makes this a suboptimal test. **chest x-ray 03/20/18>> shows severe changes of chronic bronchitis, small bilateral pleural effusions.  In comparison with previous the large pleural effusions and the bilateral pneumonia changes appear to have resolved.   Chest x-ray 12/15/17, bilateral perihilar infiltrates  consistent with   **CT chest 12/11/17>> bilateral infiltrates with bilateral pleural effusions.  Cardiomegaly, biatrial dilatation, evidence of volume overload with contrast reflux into the IVC and hepatic veins.  **Desat walk on oxygen at 2L>> sat is 92% and HR 62. After walking 360 feet sat dropped to 85% as pt was mouth breathing and not triggering O2, moderate dyspena. After sitting for 20 s and nose breathing her  sat increased to 92% and [id number]  Medication:    Current Outpatient Medications:  .  acetaminophen (TYLENOL) 500 MG tablet, Take 1,000 mg by mouth daily as needed (for pain.)., Disp: , Rfl:  .  allopurinol (ZYLOPRIM) 100 MG tablet, Take 100 mg by mouth daily. , Disp: , Rfl:  .  apixaban (ELIQUIS) 5 MG TABS tablet, Take 1 tablet (5 mg total) by mouth 2 (two) times daily., Disp: 180 tablet, Rfl: 4 .  cyanocobalamin (,VITAMIN B-12,) 1000 MCG/ML injection, Inject 1,000 mcg into the muscle every 30 (thirty) days., Disp: , Rfl:  .  diltiazem (CARTIA XT) 120 MG 24 hr capsule, Take 1 capsule (120 mg total) by mouth daily., Disp: 90 capsule, Rfl: 3 .  ezetimibe (ZETIA) 10 MG tablet, Take 1 tablet (10 mg total) by mouth daily., Disp: 90 tablet, Rfl: 3 .  furosemide (LASIX) 40 MG tablet, Take 1 tablet (40 mg total) by mouth daily., Disp: 90 tablet, Rfl: 3 .  KLOR-CON M10 10 MEQ tablet, Take 1 tablet (10 mEq total) by mouth 2 (two) times daily., Disp: 180 tablet, Rfl: 3 .  levothyroxine (SYNTHROID, LEVOTHROID) 112 MCG tablet, Take 112 mcg by mouth daily before breakfast., Disp: , Rfl:  .  LINIMENTS EX, Apply 1 application topically 3 (three) times daily as needed (for pain). Horse Lininment, Disp: , Rfl:  .  lisinopril (PRINIVIL,ZESTRIL) 40 MG tablet, Take 1 tablet (40 mg total) by mouth daily., Disp: 90 tablet, Rfl: 3 .  metoprolol tartrate (LOPRESSOR) 25 MG tablet, Take 1 tablet (25 mg total) by mouth 2 (two) times daily., Disp: 180 tablet, Rfl: 3 .  simvastatin (ZOCOR) 20 MG tablet, Take 20 mg by mouth daily., Disp: , Rfl:  No current facility-administered medications for this visit.   Facility-Administered Medications Ordered in Other Visits:  .  albuterol (PROVENTIL) (2.5 MG/3ML) 0.083% nebulizer solution 2.5 mg, 2.5 mg, Nebulization, Once, Laverle Hobby, MD   Allergies:  Patient has no known allergies.  Review of Systems:  Constitutional: Feels well. Cardiovascular: No chest pain.    Pulmonary: Denies dyspnea.   The remainder of systems were reviewed and were found to be negative other than what is documented in the HPI.   Physical Examination:   VS: BP 124/82 (BP Location: Left Arm, Cuff Size: Normal)   Pulse (!) 59   Resp 16   Ht 5' 2.5" (1.588 m)   Wt 153 lb (69.4 kg)   SpO2 99%   BMI 27.54 kg/m   General Appearance: No distress  Neuro:without focal findings, mental status, speech normal, alert and oriented HEENT: PERRLA, EOM intact. No oral or nasal abnormalities found.  Pulmonary: No wheezing, No rales  CardiovascularNormal S1,S2.  No m/r/g.  Abdomen: Benign, Soft, non-tender, No masses Renal:  No costovertebral tenderness  GU:  No performed at this time. Endoc: No evident thyromegaly, no signs of acromegaly or Cushing features Skin:   warm, no rashes, no ecchymosis  Extremities: normal, no cyanosis, clubbing.      LABORATORY PANEL:   CBC No results for input(s): WBC, HGB, HCT,  PLT in the last 168 hours. ------------------------------------------------------------------------------------------------------------------  Chemistries  No results for input(s): NA, K, CL, CO2, GLUCOSE, BUN, CREATININE, CALCIUM, MG, AST, ALT, ALKPHOS, BILITOT in the last 168 hours.  Invalid input(s): GFRCGP ------------------------------------------------------------------------------------------------------------------  Cardiac Enzymes No results for input(s): TROPONINI in the last 168 hours. ------------------------------------------------------------  RADIOLOGY:  Dg Chest 2 View  Result Date: 08/29/2018 CLINICAL DATA:  Coughing up blood for 5 days. EXAM: CHEST - 2 VIEW COMPARISON:  03/20/2018 FINDINGS: Cardiac silhouette is mildly enlarged. No mediastinal or hilar masses. No evidence of adenopathy. Clear lungs. No pleural effusion or pneumothorax. Skeletal structures are demineralized but intact. IMPRESSION: 1. No acute cardiopulmonary disease. 2. Mild stable  cardiomegaly. Electronically Signed   By: Lajean Manes M.D.   On: 08/29/2018 16:21       Thank  you for the consultation and for allowing [facility] to assist in the care of your patient. Our recommendations are noted above.  Please contact us if we can be of further service.  Marda Stalker, M.D., F.C.C.P.  Board Certified in Internal Medicine, Pulmonary Medicine, [place], and Sleep Medicine.  [facility] Office Number: [phone]

## 2018-08-31 ENCOUNTER — Other Ambulatory Visit
Admission: RE | Admit: 2018-08-31 | Discharge: 2018-08-31 | Disposition: A | Discharge status: Home / Self Care | Payer: Medicare Other | Source: Ambulatory Visit | Attending: Internal Medicine | Admitting: Internal Medicine

## 2018-08-31 ENCOUNTER — Encounter: Payer: Self-pay | Admitting: Internal Medicine

## 2018-08-31 ENCOUNTER — Ambulatory Visit (INDEPENDENT_AMBULATORY_CARE_PROVIDER_SITE_OTHER): Payer: Medicare Other | Admitting: Internal Medicine

## 2018-08-31 VITALS — BP 124/82 | HR 59 | Resp 16 | Ht 62.5 in | Wt 153.0 lb

## 2018-08-31 DIAGNOSIS — R042 Hemoptysis: Secondary | ICD-10-CM

## 2018-08-31 DIAGNOSIS — R0609 Other forms of dyspnea: Secondary | ICD-10-CM

## 2018-08-31 LAB — COMPREHENSIVE METABOLIC PANEL
ALT: 17 U/L (ref 0–44)
ANION GAP: 14 (ref 5–15)
AST: 23 U/L (ref 15–41)
Albumin: 4 g/dL (ref 3.5–5.0)
Alkaline Phosphatase: 94 U/L (ref 38–126)
BUN: 46 mg/dL — ABNORMAL HIGH (ref 8–23)
CALCIUM: 10 mg/dL (ref 8.9–10.3)
CHLORIDE: 100 mmol/L (ref 98–111)
CO2: 28 mmol/L (ref 22–32)
CREATININE: 1.73 mg/dL — AB (ref 0.44–1.00)
GFR, EST AFRICAN AMERICAN: 32 mL/min — AB (ref >60)
GFR, EST NON AFRICAN AMERICAN: 27 mL/min — AB (ref >60)
Glucose, Bld: 123 mg/dL — ABNORMAL HIGH (ref 70–99)
Potassium: 3.8 mmol/L (ref 3.5–5.1)
SODIUM: 142 mmol/L (ref 135–145)
Total Bilirubin: 0.7 mg/dL (ref 0.3–1.2)
Total Protein: 7.6 g/dL (ref 6.5–8.1)

## 2018-08-31 LAB — PROTIME-INR
INR: 1.69
PROTHROMBIN TIME: 19.7 s — AB (ref 11.4–15.2)

## 2018-08-31 NOTE — Addendum Note (Signed)
Addended by: Santiago Bur on: 08/31/2018 09:11 AM   Modules accepted: Orders

## 2018-08-31 NOTE — Patient Instructions (Signed)
Continue to increase activity.  Call back if bleeding recurs.

## 2018-09-07 ENCOUNTER — Other Ambulatory Visit: Payer: Self-pay | Admitting: Cardiovascular Disease

## 2018-09-07 NOTE — Telephone Encounter (Signed)
Please review for refill, Thanks !  

## 2018-09-07 NOTE — Telephone Encounter (Signed)
rx refill sent as requested.

## 2018-09-07 NOTE — Telephone Encounter (Signed)
°*  STAT* If patient is at the pharmacy, call can be transferred to refill team.   1. Which medications need to be refilled? (please list name of each medication and dose if known)     Eliquis 5 mg po BID  2. Which pharmacy/location (including street and city if local pharmacy) is medication to be sent to?     [place]  3. Do they need a 30 day or 90 day supply?  [place]

## 2018-10-01 ENCOUNTER — Telehealth: Payer: Self-pay | Admitting: Internal Medicine

## 2018-10-01 DIAGNOSIS — J9601 Acute respiratory failure with hypoxia: Secondary | ICD-10-CM

## 2018-10-01 DIAGNOSIS — J9691 Respiratory failure, unspecified with hypoxia: Secondary | ICD-10-CM

## 2018-10-01 NOTE — Telephone Encounter (Signed)
Pt is returning your call

## 2018-10-01 NOTE — Telephone Encounter (Signed)
If the ONO shows that she needs oxygen at night will she accept the oxygen? If not there is no point for the ONO and can go ahead and DC the oxygen.

## 2018-10-01 NOTE — Telephone Encounter (Signed)
LMTCB x 1 

## 2018-10-01 NOTE — Telephone Encounter (Signed)
Pt just wants orders placed to d/c 02. Oxygen order submitted.

## 2018-10-01 NOTE — Telephone Encounter (Signed)
Patient requested [facility] pick up o2 .  An order is needed to do this .  Patient is agreeable to do ono to check necessity.

## 2018-12-03 ENCOUNTER — Ambulatory Visit: Payer: Medicare Other | Admitting: Podiatry

## 2018-12-20 ENCOUNTER — Other Ambulatory Visit: Payer: Self-pay | Admitting: Family Medicine

## 2018-12-20 ENCOUNTER — Ambulatory Visit
Admission: RE | Admit: 2018-12-20 | Discharge: 2018-12-20 | Disposition: A | Discharge status: Home / Self Care | Payer: Medicare Other | Source: Ambulatory Visit | Attending: Family Medicine | Admitting: Family Medicine

## 2018-12-20 DIAGNOSIS — R0602 Shortness of breath: Secondary | ICD-10-CM | POA: Insufficient documentation

## 2018-12-20 LAB — POCT I-STAT CREATININE: Creatinine, Ser: 1.8 mg/dL — ABNORMAL HIGH (ref 0.44–1.00)

## 2018-12-24 ENCOUNTER — Telehealth: Payer: Self-pay | Admitting: Cardiovascular Disease

## 2018-12-24 NOTE — Telephone Encounter (Signed)
Routed call to Dr. [name] for further advice.

## 2018-12-24 NOTE — Telephone Encounter (Signed)
No note in the computer from primary care [place] why x-ray was ordered if she worsening shortness of breath She does take Lasix 40 daily X-rays has mild CHF If she has ankle swelling leg edema abdominal bloating weight gain she could take a day or 2 of Lasix 40 twice daily, cut back on salt and fluids then go back to 40 daily If she is not having any symptoms no leg swelling do not know if we need to change anything

## 2018-12-24 NOTE — Telephone Encounter (Signed)
[facility] calling States that patient needs to be seen as soon as possible due to chest x ray - would like for office and Dr. [name] to take a look  Patient scheduled on 1/7 with R. Dunn  Please review and advise if patient needs sooner appointment

## 2018-12-25 NOTE — Telephone Encounter (Signed)
Call to patient with recommendations from Dr. [name].  She denies increase in swelling and reported that PCP had her take 3 day increase of Lasix at her last OV and abd swelling improved.  She agrees to call us if sx recur. She verbalized agreement in POC.  Has f/u on 1/7 with Christell Faith, PA. She would like to discuss possible cardiac rehab at that time to learn about meal planning with CHF.   Advised pt to call for any further questions or concerns

## 2018-12-31 NOTE — Progress Notes (Signed)
Cardiology Office Note Date:  01/01/2019  Patient ID:  Caroline [name], Caroline [name] January 24, 1941, MRN [id number] PCP:  Remi Haggard, FNP  Cardiologist:  Dr. [name], MD    Chief Complaint: Evaluation of shortness of breath  History of Present Illness: Caroline [name] is a 78 y.o. female with history of nonobstructive CAD by [place] in 08/2004, PAF on Eliquis diagnosed in 04/1699, chronic diastolic CHF, pulmonary hypertension, multiple TIA/CVAs, carotid artery disease, CKD stage IV, pleural effusion status post left-sided thoracentesis in 11/2017, hypertension, hypothyroidism, and nephrolithiasis who presents for evaluation of shortness of breath.  Patient underwent cardiac cath 08/2004 that showed 20 to 30% mid LCx stenosis, EF 60%.  No ischemic evaluation since.  Echo at outside office in 2014 showed normal LV systolic function, normal wall motion, mild LVH, mild MR/TR, mild to moderate pulmonary regurgitation.  She was admitted to the hospital in 05/2017 with a TIA.  Echocardiogram at that time showed an EF of 65 to 70%, moderate concentric LVH, mild aortic insufficiency, calcified mitral annulus with mildly thickened leaflets with moderate mitral regurgitation, mildly dilated left atrium.  Outpatient cardiac monitoring showed normal sinus rhythm with episodes of PAF (4 hours total), PSVT with heart rates up to 180 bpm lasting less than 30 seconds.  Patient underwent left total knee arthroplasty in 11/2017 with postoperative course being complicated by multiple admissions for multifocal acute respiratory failure secondary to pneumonia, diastolic CHF, and pleural effusion status post left-sided thoracentesis.  Echo during that admission showed an EF of 55 to 60%, moderate concentric LVH, no regional wall motion abnormalities, grade 2 diastolic dysfunction, moderate mitral stenosis with mild mitral regurgitation, moderately dilated left atrium, PASP 50 mmHg.  Patient symptoms improved with diuresis and management of  her pneumonia.  She was last seen in the office in 06/2018 with BP 140/80, weight stable at 153 pounds.  She noted some fatigue.  In 08/2018 she was seen by pulmonology for follow-up of her chronic dyspnea and noted oral bleeding of uncertain etiology.  She was continued on Eliquis.  Patient comes in accompanied by her husband today.  She states that she "never fully got over her issue from back in December."  In stating this, she is referring to her multiple admissions from 11/2017 as outlined above.  However, over the past several months she has noted a worsening shortness of breath with exertion.  At rest, she is relatively asymptomatic.  There has been no lower extremity swelling, abdominal distention, orthopnea, PND, or early satiety.  No chest pain, palpitations, dizziness, presyncope, or syncope.  In this setting, the patient's PCP ordered both a chest x-ray and CTA chest to evaluate her shortness of breath.  The patient's CTA of the chest could not be completed secondary to her underlying CKD stage IV.  Of note, the patient is adequately anticoagulated with Eliquis for underlying A. fib.  Chest x-ray showed possible mild CHF.  She has been prescribed PRN oxygen by PCP in the setting of ambulatory hypoxia dipping down into the low to mid 80s on room air.  The patient is due to have this oxygen delivered to her house later today.  She is followed by [facility] nephrology for her underlying CKD with noted stable renal function at her last visit on 12/11/2018.  Over the past couple of months, the patient's blood pressure has been poorly controlled typically ranging in the 174B to 449Q systolic.   Past Medical History:  Diagnosis Date  . Arthritis   .  Chronic diastolic CHF (congestive heart failure) ([place])    a. 05/2017 Echo: 65-70%, mild AI, moderate MR, mildly dilated LA; b. 11/2017 Echo: EF 55-60%, no rwma, Gr2 DD, mod MS, mild MR, mod dil LA, PASP 33mmHg.  . CKD (chronic kidney disease), stage IV ([place])   .  History of kidney stones 01/2017  . Hypertension   . Hypothyroidism   . Non-obstructive CAD (coronary artery disease)    a. 2005 Cath: LCX 20-37m, otw nl cors.  Marland Kitchen PAF (paroxysmal atrial fibrillation) ([place])    a. On Eliquis; b. CHADS2VASc => 8 (CHF, HTN, age x 2, stroke x 2, vascular disease, female)  . Stroke [facility])    TIA 9/17 & 04/06/2017    Past Surgical History:  Procedure Laterality Date  . ABDOMINAL HYSTERECTOMY    . CARDIAC CATHETERIZATION     Negative   . COLONOSCOPY  2010  . PARTIAL HYSTERECTOMY    . TOTAL KNEE ARTHROPLASTY Left 11/28/2017   Procedure: TOTAL KNEE ARTHROPLASTY;  Surgeon: Thornton Park, MD;  Location: [facility] ORS;  Service: Orthopedics;  Laterality: Left;    Current Meds  Medication Sig  . acetaminophen (TYLENOL) 500 MG tablet Take 1,000 mg by mouth daily as needed (for pain.).  Marland Kitchen allopurinol (ZYLOPRIM) 100 MG tablet Take 100 mg by mouth daily.   . cyanocobalamin (,VITAMIN B-12,) 1000 MCG/ML injection Inject 1,000 mcg into the muscle every 30 (thirty) days.  Marland Kitchen ELIQUIS 5 MG TABS tablet TAKE 1 TABLET BY MOUTH TWICE DAILY  . ezetimibe (ZETIA) 10 MG tablet Take 1 tablet (10 mg total) by mouth daily.  . furosemide (LASIX) 40 MG tablet Take 1 tablet (40 mg total) by mouth daily.  Marland Kitchen levothyroxine (SYNTHROID, LEVOTHROID) 112 MCG tablet Take 112 mcg by mouth daily before breakfast.  . lisinopril (PRINIVIL,ZESTRIL) 40 MG tablet Take 1 tablet (40 mg total) by mouth daily.  . metoprolol tartrate (LOPRESSOR) 50 MG tablet Take 1 tablet (50 mg total) by mouth 2 (two) times daily.  . simvastatin (ZOCOR) 20 MG tablet Take 20 mg by mouth daily.  . [DISCONTINUED] metoprolol tartrate (LOPRESSOR) 25 MG tablet Take 1 tablet (25 mg total) by mouth 2 (two) times daily.    Allergies:   Patient has no known allergies.   Social History:  The patient  reports that she has never smoked. She has never used smokeless tobacco. She reports current alcohol use of about 1.0 standard drinks  of alcohol per week. She reports that she does not use drugs.   Family History:  The patient's family history includes Heart attack (age of onset: 31) in her father; Heart disease in her brother; Hyperlipidemia in her father; Hypertension in her father.  ROS:   Review of Systems  Constitutional: Positive for malaise/fatigue. Negative for chills, diaphoresis, fever and weight loss.  HENT: Negative for congestion.   Eyes: Negative for discharge and redness.  Respiratory: Positive for shortness of breath. Negative for cough, hemoptysis, sputum production and wheezing.   Cardiovascular: Negative for chest pain, palpitations, orthopnea, claudication, leg swelling and PND.  Gastrointestinal: Negative for abdominal pain, blood in stool, heartburn, melena, nausea and vomiting.  Genitourinary: Negative for hematuria.  Musculoskeletal: Negative for falls and myalgias.  Skin: Negative for rash.  Neurological: Negative for dizziness, tingling, tremors, sensory change, speech change, focal weakness, loss of consciousness and weakness.  Endo/Heme/Allergies: Does not bruise/bleed easily.  Psychiatric/Behavioral: Negative for substance abuse. The patient is not nervous/anxious.   All other systems reviewed and are negative.  PHYSICAL EXAM:  VS:  BP (!) 180/90 (BP Location: Right Arm, Patient Position: Sitting, Cuff Size: Normal)   Pulse 73   Ht 5\' 2"  (1.575 m)   Wt 156 lb 8 oz (71 kg)   SpO2 97%   BMI 28.62 kg/m  BMI: Body mass index is 28.62 kg/m.  Physical Exam  Constitutional: She is oriented to person, place, and time. She appears well-developed and well-nourished.  HENT:  Head: Normocephalic and atraumatic.  Eyes: Right eye exhibits no discharge. Left eye exhibits no discharge.  Neck: Normal range of motion. No JVD present.  Cardiovascular: Normal rate, regular rhythm, S1 normal and S2 normal. Exam reveals no distant heart sounds, no friction rub, no midsystolic click and no opening snap.   Murmur heard.  Low-pitched rumbling crescendo presystolic murmur is present with a grade of 2/6 at the apex. Pulses:      Posterior tibial pulses are 2+ on the right side and 2+ on the left side.  Pulmonary/Chest: Effort normal and breath sounds normal. No respiratory distress. She has no decreased breath sounds. She has no wheezes. She has no rales. She exhibits no tenderness.  Abdominal: Soft. She exhibits no distension. There is no abdominal tenderness.  Musculoskeletal:        General: No edema.  Neurological: She is alert and oriented to person, place, and time.  Skin: Skin is warm and dry. No cyanosis. Nails show no clubbing.  Psychiatric: She has a normal mood and affect. Her speech is normal and behavior is normal. Judgment and thought content normal.     EKG:  Was ordered and interpreted by me today. Shows NSR, 73 bpm, rare PAC, LVH with early repolarization abnormality, no acute ST-T changes  Recent Labs: 08/31/2018: ALT 17; BUN 46; Potassium 3.8; Sodium 142 12/20/2018: Creatinine, Ser 1.80  No results found for requested labs within last 8760 hours.   Estimated Creatinine Clearance: 23.8 mL/min (A) (by C-G formula based on SCr of 1.8 mg/dL (H)).   Wt Readings from Last 3 Encounters:  01/01/19 156 lb 8 oz (71 kg)  08/31/18 153 lb (69.4 kg)  07/09/18 153 lb 12 oz (69.7 kg)     Other studies reviewed: Additional studies/records reviewed today include: summarized above  ASSESSMENT AND PLAN:  1. Dyspnea on exertion: Symptoms of dyspnea on exertion with ambulatory hypoxia on room air are likely multifactorial including pulmonary hypertension, possible COPD in the setting of prolonged history of secondhand smoke, mitral stenosis, and diastolic CHF.  Patient has been prescribed PRN oxygen for home use by PCP.  Would recommend getting the patient established with pulmonologist.  2. Pulmonary hypertension: This is likely the driving force in the patient's symptoms.  Obtain  echocardiogram to trend pulmonary arterial pressures.  If they are found to be significantly elevated would recommend right heart cath to further evaluate.  Overall, this is a difficult situation given her moderately elevated PA pressures noted on echo in 11/2017 of a 50 mmHg with underlying CKD stage IV.  Following echocardiogram and potential right heart cath would have low threshold to refer to pulmonary hypertension clinic in [place].  I think the possibility of the patient having an acute PE playing a role in her dyspnea is actually quite low given the patient is anticoagulated with Eliquis and reports compliance.  That being said, down the road, we may need to consider a VQ scan to evaluate for chronic PE given her underlying pulmonary hypertension.  This was discussed with the patient  today and she is interested in this.  3. Mitral stenosis: This too is likely playing a role in the patient's underlying shortness of breath.  Patient's echo from 05/2017 did not reveal any significant mitral valve stenosis however 6 months later she was noted to have moderate mitral stenosis.  In this setting, we have elected to obtain repeat echocardiogram to help further evaluate the patient's shortness of breath, pulmonary hypertension, and valvular heart disease.  If she is truly found to have significant mitral valve disease, as outlined below, we will need to readdress her Eliquis and would also refer to Dr. Burt Knack for evaluation of potential mitral clip.  4. Diastolic CHF: She has a component of diastolic heart failure, however this is not her predominant problem as outlined above.  With improvement in her blood pressure she will be less likely to have diastolic heart failure exacerbations.  Lungs are clear on exam today.  For now, she can continue Lasix 40 mg daily.  We have optimized her antihypertensive therapy as outlined below.  5. Nonobstructive CAD: No symptoms of angina, however I cannot exclude the  patient's shortness of breath on exertion potentially being her anginal equivalent.  This is an overall difficult situation given her underlying CKD stage IV.  In this setting should we need to proceed with a right heart cath as outlined above, we may need to admit her for hydration and pursue left heart catheterization at the same time.  She remains on Eliquis in place of aspirin.  Continue simvastatin and Zetia per PCP.  6. PAF: She is maintaining sinus rhythm today.  Continue Lopressor as outlined below for rate control.  For now, we will continue her on Eliquis 5 mg twice daily, she does not meet reduced dosing criteria.  However, should she be found to have moderate to severe mitral stenosis on repeat echocardiogram we will need to transition her from Eliquis to Coumadin.  7. CKD stage IV: Followed by [facility] nephrology.  Will need close monitoring with the above work-up.  8. Hypertensive heart disease: Blood pressure is poorly controlled today at 180/90.  Increase Lopressor to 50 mg twice daily.  Add amlodipine 5 mg daily.  Continue lisinopril 40 mg and Lasix 40 mg daily.  Disposition: F/u with Dr. [name] or an APP in 1 month.  Current medicines are reviewed at length with the patient today.  The patient did not have any concerns regarding medicines.  Signed, Christell Faith, PA-C 01/01/2019 4:43 PM     [facility] [facility] [place] [address] [place] [address] [place], [place] [address] [phone]

## 2019-01-01 ENCOUNTER — Encounter: Payer: Self-pay | Admitting: Physician Assistant

## 2019-01-01 ENCOUNTER — Ambulatory Visit (INDEPENDENT_AMBULATORY_CARE_PROVIDER_SITE_OTHER): Payer: Medicare Other | Admitting: Physician Assistant

## 2019-01-01 VITALS — BP 180/90 | HR 73 | Ht 62.0 in | Wt 156.5 lb

## 2019-01-01 DIAGNOSIS — I480 Paroxysmal atrial fibrillation: Secondary | ICD-10-CM | POA: Diagnosis not present

## 2019-01-01 DIAGNOSIS — R0609 Other forms of dyspnea: Secondary | ICD-10-CM | POA: Diagnosis not present

## 2019-01-01 DIAGNOSIS — I5032 Chronic diastolic (congestive) heart failure: Secondary | ICD-10-CM

## 2019-01-01 DIAGNOSIS — I2510 Atherosclerotic heart disease of native coronary artery without angina pectoris: Secondary | ICD-10-CM

## 2019-01-01 DIAGNOSIS — I272 Pulmonary hypertension, unspecified: Secondary | ICD-10-CM

## 2019-01-01 DIAGNOSIS — I110 Hypertensive heart disease with heart failure: Secondary | ICD-10-CM

## 2019-01-01 DIAGNOSIS — I050 Rheumatic mitral stenosis: Secondary | ICD-10-CM | POA: Diagnosis not present

## 2019-01-01 DIAGNOSIS — N184 Chronic kidney disease, stage 4 (severe): Secondary | ICD-10-CM

## 2019-01-01 MED ORDER — AMLODIPINE BESYLATE 5 MG PO TABS: 5.0000 mg | ORAL_TABLET | Freq: Every day | ORAL | 3 refills | Status: DC

## 2019-01-01 MED ORDER — METOPROLOL TARTRATE 50 MG PO TABS: 50.0000 mg | ORAL_TABLET | Freq: Two times a day (BID) | ORAL | 3 refills | Status: AC

## 2019-01-01 NOTE — Patient Instructions (Signed)
Medication Instructions:  Your physician has recommended you make the following change in your medication:  1- INCREASE Lopressor to Take 1 tablet (50 mg total) by mouth 2 (two) times daily 2- START Amlodipine Take 1 tablet (5 mg total) by mouth daily  If you need a refill on your cardiac medications before your next appointment, please call your pharmacy.   Lab work: None ordered  If you have labs (blood work) drawn today and your tests are completely normal, you will receive your results only by: Marland Kitchen MyChart Message (if you have MyChart) OR . A paper copy in the mail If you have any lab test that is abnormal or we need to change your treatment, we will call you to review the results.  Testing/Procedures: 1- ECHO Your physician has requested that you have an echocardiogram. Echocardiography is a painless test that uses sound waves to create images of your heart. It provides your doctor with information about the size and shape of your heart and how well your heart's chambers and valves are working. This procedure takes approximately one hour. There are no restrictions for this procedure.  Follow-Up: At [facility], you and your health needs are our priority.  As part of our continuing mission to provide you with exceptional heart care, we have created designated Provider Care Teams.  These Care Teams include your primary Cardiologist (physician) and Advanced Practice Providers (APPs -  Physician Assistants and Nurse Practitioners) who all work together to provide you with the care you need, when you need it. You will need a follow up appointment in 1 months. You may see Ida Rogue, MD or one of the following Advanced Practice Providers on your designated Care Team:   Murray Hodgkins, NP Christell Faith, PA-C . Marrianne Mood, PA-C

## 2019-01-07 ENCOUNTER — Encounter

## 2019-01-07 ENCOUNTER — Ambulatory Visit: Payer: Medicare Other | Admitting: Cardiovascular Disease

## 2019-01-09 ENCOUNTER — Telehealth: Payer: Self-pay | Admitting: Cardiovascular Disease

## 2019-01-09 NOTE — Telephone Encounter (Signed)
Please call to discuss pt medication Zetia, pt states she does not recognize this medication.

## 2019-01-09 NOTE — Telephone Encounter (Signed)
Patient returning call about zetia

## 2019-01-09 NOTE — Telephone Encounter (Signed)
No answer. Left message to call back.   

## 2019-01-09 NOTE — Telephone Encounter (Signed)
Patient states she does not remember ever starting zetia. Dr [name] prescribed it in July 2019. Patient never started. She is due for follow up as scheduled this February. She will plan to discuss it with him at that time.

## 2019-01-24 ENCOUNTER — Ambulatory Visit (INDEPENDENT_AMBULATORY_CARE_PROVIDER_SITE_OTHER): Payer: Medicare Other

## 2019-01-24 DIAGNOSIS — R0609 Other forms of dyspnea: Secondary | ICD-10-CM | POA: Diagnosis not present

## 2019-01-27 NOTE — Progress Notes (Addendum)
Cardiology Office Note  Date:  01/28/2019   ID:  Caroline [name] 11-Jun-1941, MRN [id number]  PCP:  Remi Haggard, FNP   Chief Complaint  Patient presents with  . other    1 month follow up and discuss Echo. Meds reviewed by the pt. verbally. Pt. c/o shortness of breath with over exertion.     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   She denies lower extremity edema, abdominal distention, orthopnea PND No significant chest pain or tightness Does not walk on a regular basis, legs are deconditioned  She had chest x-ray  With mild CHF Also has underlying CKD stage IV.    She continues to take Eliquis for underlying A. fib. And CVA  She is taking oxygen at nighttime when she sleeps,  prescribed PRN  by PCP in the setting of ambulatory hypoxia dipping down into the low to mid 80s on room air.   Previously seen by pulmonary, had 6-minute walk test at that time   followed by [facility] nephrology Has required escalating amounts of Lasix for shortness of breath Creatinine  1.8  Blood pressure well controlled through most of 2019, higher recently She is not measuring blood pressure at home, " needs a new blood pressure cuff"  Echo 01/24/2019, results reviewed with her  1. The left ventricle has normal systolic function of 28-41%. The cavity size is mildly increased. There is mild left ventricular wall thickness. Echo evidence of pseudonormal diastolic filling patterns.  2. The right ventricle is in size. There is normal  systolic function. Right ventricular systolic pressure is severely elevated with an estimated pressure of 60.0 mmHg.  3. Moderately dilated left atrial size.  . The inferior vena cava was normal in size <50% respiratory variablity.   Previously asymptomatic when in atrial fibrillation based on previous EKGs and event monitors  Previously on Crestor Weight has been stable  EKG personally reviewed by myself on todays visit Shows normal sinus rhythm with rate 66 bpm no significant ST or T-wave changes  Other past medical history reviewed hospital December 2018 acute respiratory failure  after left knee total replacement  Readmitted with acute respiratory failure concerning for pneumonia, diastolic CHF Treated with antibiotics, Had diuresis Paroxysmal atrial fibrillation maintained in sinus rhythm   30 day monitor episodes of paroxysmal atrial fibrillation Based on this we started her on Cardizem 120 mg daily , stopped amlodipine Clonidine held for bradycardia hypotension  old TIA on 09/24/2016,   took herself off statins in the past  TIA 04/06/2017, lasted 10 min presented to the ED with a chief complaint of slurred speech and right-sided weakness. Symptoms  Resolved.  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   Cardiac catheterization September 2005 showing 20-30% mid left circumflex disease, ejection fraction 60%, otherwise normal study   PMH:   has a past medical history of Arthritis, Chronic diastolic CHF (congestive heart failure) ([place]), CKD (chronic kidney disease), stage IV ([place]), History of kidney stones (01/2017), Hypertension, Hypothyroidism, Non-obstructive CAD (coronary artery disease), PAF (paroxysmal atrial fibrillation) ([place]), and  Stroke [facility]).  PSH:    Past Surgical History:  Procedure Laterality Date  . ABDOMINAL HYSTERECTOMY    . CARDIAC CATHETERIZATION     Negative   . COLONOSCOPY  2010  . PARTIAL HYSTERECTOMY    . TOTAL KNEE ARTHROPLASTY  Left 11/28/2017   Procedure: TOTAL KNEE ARTHROPLASTY;  Surgeon: Thornton Park, MD;  Location: [facility] ORS;  Service: Orthopedics;  Laterality: Left;    Current Outpatient Medications  Medication Sig Dispense Refill  . acetaminophen (TYLENOL) 500 MG tablet Take 1,000 mg by mouth daily as needed (for pain.).    Marland Kitchen allopurinol (ZYLOPRIM) 100 MG tablet Take 100 mg by mouth daily.     Marland Kitchen amLODipine (NORVASC) 5 MG tablet Take 1 tablet (5 mg total) by mouth daily. 90 tablet 3  . cyanocobalamin (,VITAMIN B-12,) 1000 MCG/ML injection Inject 1,000 mcg into the muscle every 30 (thirty) days.    Marland Kitchen ELIQUIS 5 MG TABS tablet TAKE 1 TABLET BY MOUTH TWICE DAILY 60 tablet 5  . furosemide (LASIX) 40 MG tablet Take 1 tablet (40 mg total) by mouth daily. 90 tablet 3  . levothyroxine (SYNTHROID, LEVOTHROID) 112 MCG tablet Take 112 mcg by mouth daily before breakfast.    . lisinopril (PRINIVIL,ZESTRIL) 40 MG tablet Take 1 tablet (40 mg total) by mouth daily. 90 tablet 3  . metoprolol tartrate (LOPRESSOR) 50 MG tablet Take 1 tablet (50 mg total) by mouth 2 (two) times daily. 180 tablet 3  . simvastatin (ZOCOR) 20 MG tablet Take 20 mg by mouth daily.     No current facility-administered medications for this visit.    Facility-Administered Medications Ordered in Other Visits  Medication Dose Route Frequency Provider Last Rate Last Dose  . albuterol (PROVENTIL) (2.5 MG/3ML) 0.083% nebulizer solution 2.5 mg  2.5 mg Nebulization Once Laverle Hobby, MD         Allergies:   Patient has no known allergies.   Social History:  The patient  reports that she has never smoked. She has never used smokeless tobacco. She reports current alcohol use of about 1.0 standard drinks of alcohol per week. She reports that she does not use drugs.   Family History:   family history includes Heart attack (age of onset: 40) in her father; Heart disease in her brother; Hyperlipidemia in her father; Hypertension in her father.     Review of Systems: Review of Systems  Constitutional: Positive for malaise/fatigue.  Respiratory: Positive for shortness of breath.   Cardiovascular: Negative.   Gastrointestinal: Negative.   Musculoskeletal: Negative.   Neurological: Negative.        Slurred speech, right-sided weakness, resolved  Psychiatric/Behavioral: Negative.   All other systems reviewed and are negative.    PHYSICAL EXAM: VS:  BP (!) 158/70 (BP Location: Left Arm, Patient Position: Sitting, Cuff Size: Normal)   Pulse 66   Ht 5\' 2"  (1.575 m)   Wt 162 lb 12 oz (73.8 kg)   BMI 29.77 kg/m  , BMI Body mass index is 29.77 kg/m. Constitutional:  oriented to person, place, and time. No distress.  HENT:  Head: Grossly normal Eyes:  no discharge. No scleral icterus.  Neck: JVD 8-10, no carotid bruits  Cardiovascular: Regular rate and rhythm, no murmurs appreciated Pulmonary/Chest: Clear to auscultation bilaterally, no wheezes or rails Abdominal: Soft.  no distension.  no tenderness.  Musculoskeletal: Normal range of motion Neurological:  normal muscle tone. Coordination normal. No atrophy Skin: Skin warm and dry Psychiatric: normal affect, pleasant  Recent Labs: 08/31/2018: ALT 17; BUN 46; Potassium 3.8; Sodium 142 12/20/2018: Creatinine, Ser 1.80    Lipid Panel Lab Results  Component Value Date   CHOL 281 (H) 06/07/2017   HDL 30 (L) 06/07/2017   LDLCALC UNABLE TO CALCULATE IF TRIGLYCERIDE OVER 400 mg/dL 06/07/2017   TRIG 448 (H) 06/07/2017      Wt Readings from Last 3 Encounters:  01/28/19 162 lb 12 oz (73.8 kg)  01/01/19 156 lb 8 oz (71 kg)  08/31/18 153 lb (69.4 kg)       ASSESSMENT AND PLAN:  Bilateral carotid artery stenosis - Moderate disease, followed with periodic ultrasound Ultrasound done through outside office and available for review Stressed importance of aggressive cholesterol management Goal LDL less than 70 Previously took herself off statin She reports lab work  through outside primary care, we have requested this lab work for our review  Coronary artery disease involving native heart without angina pectoris, unspecified vessel or lesion type - Recommended regular walking program, denies anginal symptoms No further work-up at this time Normal ejection fraction  Hyperlipidemia, unspecified hyperlipidemia type - Notes indicating she is on simvastatin We have requested lipid panel from primary care Goal LDL less than 70 We previously suggested she take Zetia, does not appear to be on her list on today's visit.  We will recommend again to her  Essential hypertension - Plan: EKG 12-Lead, Cardiac event monitor Blood pressure elevated on today's visit, numbers last year were well controlled We could increase amlodipine up to 10 mg daily, prior to this recommended she buy blood pressure cuff and check numbers at home and call us with blood pressure measurements in the next week or 2  Cerebrovascular accident (CVA), unspecified mechanism ([place]) -  Likely secondary to atrial fibrillation, on anticoagulation Stressed importance of medication compliance  no recent TIA or stroke symptoms  Paroxysmal atrial fibrillation (HCC) Maintaining normal sinus rhythm On anticoagulation  Pulmonary hypertension Elevated pressures on last several echocardiograms She does have mitral valve stenosis, mild, possibly moderate Discussed whether she would like more advanced therapies, we will refer her to [facility] pulmonary hypertension clinic.  We did discuss with her some details.  They would likely require various types of testing including right heart catheterization.  Uncertain if she would be a candidate for various types of medications.  Suspect she is running more on the prerenal side but pressure still 60.  Escalating Lasix dose has not helped her breathing very much, only contributing to worsening renal dysfunction  Mitral valve stenosis Select images on recent  echo suggesting mild stenosis, second set of images suggesting more moderate. Unable to estimate by planar view. Unable to exclude that this could be contributing to elevated right heart pressures, though suspect not the sole contributor  Disposition:   F/U  12 months   Total encounter time more than 25 minutes  Greater than 50% was spent in counseling and coordination of care with the patient    No orders of the defined types were placed in this encounter.    Signed, Esmond Plants, M.D., Ph.D. 01/28/2019  [place], [place]

## 2019-01-28 ENCOUNTER — Ambulatory Visit (INDEPENDENT_AMBULATORY_CARE_PROVIDER_SITE_OTHER): Payer: Medicare Other | Admitting: Cardiovascular Disease

## 2019-01-28 ENCOUNTER — Encounter: Payer: Self-pay | Admitting: Cardiovascular Disease

## 2019-01-28 VITALS — BP 158/70 | HR 66 | Ht 62.0 in | Wt 162.8 lb

## 2019-01-28 DIAGNOSIS — I272 Pulmonary hypertension, unspecified: Secondary | ICD-10-CM

## 2019-01-28 DIAGNOSIS — N184 Chronic kidney disease, stage 4 (severe): Secondary | ICD-10-CM | POA: Diagnosis not present

## 2019-01-28 DIAGNOSIS — I639 Cerebral infarction, unspecified: Secondary | ICD-10-CM

## 2019-01-28 DIAGNOSIS — I6523 Occlusion and stenosis of bilateral carotid arteries: Secondary | ICD-10-CM

## 2019-01-28 DIAGNOSIS — I480 Paroxysmal atrial fibrillation: Secondary | ICD-10-CM | POA: Diagnosis not present

## 2019-01-28 DIAGNOSIS — I110 Hypertensive heart disease with heart failure: Secondary | ICD-10-CM

## 2019-01-28 DIAGNOSIS — I5032 Chronic diastolic (congestive) heart failure: Secondary | ICD-10-CM | POA: Diagnosis not present

## 2019-01-28 DIAGNOSIS — E782 Mixed hyperlipidemia: Secondary | ICD-10-CM

## 2019-01-28 NOTE — Patient Instructions (Addendum)
Please monitor blood pressure at home  We will try to obtain carotid u/s from PMD  We will place a referral to Dr. Haroldine Laws for pulmonary HTN  Medication Instructions:  No changes  If you need a refill on your cardiac medications before your next appointment, please call your pharmacy.    Lab work: No new labs needed   If you have labs (blood work) drawn today and your tests are completely normal, you will receive your results only by: Marland Kitchen MyChart Message (if you have MyChart) OR . A paper copy in the mail If you have any lab test that is abnormal or we need to change your treatment, we will call you to review the results.   Testing/Procedures: No new testing needed   Follow-Up: At [facility], you and your health needs are our priority.  As part of our continuing mission to provide you with exceptional heart care, we have created designated Provider Care Teams.  These Care Teams include your primary Cardiologist (physician) and Advanced Practice Providers (APPs -  Physician Assistants and Nurse Practitioners) who all work together to provide you with the care you need, when you need it.  . You will need a follow up appointment in 12 months .   Please call our office 2 months in advance to schedule this appointment.    . Providers on your designated Care Team:   . Murray Hodgkins, NP . Christell Faith, PA-C . Marrianne Mood, PA-C  Any Other Special Instructions Will Be Listed Below (If Applicable).  For educational health videos Log in to : [email or web address] Or : [email or web address], password : [email or web address]

## 2019-01-31 ENCOUNTER — Telehealth: Payer: Self-pay | Admitting: Cardiovascular Disease

## 2019-01-31 NOTE — Telephone Encounter (Signed)
Please call to discuss appointment for "pressure around my heart".

## 2019-01-31 NOTE — Telephone Encounter (Signed)
Patient is referring to the referral to Advanced Heart Failure clinic in [place]. Referral is in the system. Number to AHF office given to patient to call and schedule an appointment. She was appreciative.

## 2019-02-05 ENCOUNTER — Ambulatory Visit: Payer: Medicare Other | Admitting: Cardiovascular Disease

## 2019-03-26 ENCOUNTER — Encounter (HOSPITAL_COMMUNITY): Payer: Medicare Other | Admitting: Cardiology

## 2019-05-04 ENCOUNTER — Other Ambulatory Visit: Payer: Self-pay | Admitting: Cardiovascular Disease

## 2019-05-06 ENCOUNTER — Other Ambulatory Visit: Payer: Self-pay

## 2019-05-06 NOTE — Telephone Encounter (Signed)
Please review for refill.  

## 2019-05-06 NOTE — Telephone Encounter (Signed)
*  STAT* If patient is at the pharmacy, call can be transferred to refill team.   1. Which medications need to be refilled? (please list name of each medication and dose if known) Eliquis  2. Which pharmacy/location (including street and city if local pharmacy) is medication to be sent to? [facility] [address]  3. Do they need a 30 day or 90 day supply? 30 

## 2019-05-06 NOTE — Telephone Encounter (Signed)
Pt's wt 73.8 kg, age 78, serum creatinine 1.73, CrCl 31.22. Per office protocol, will not reduce dosage to 2.5 mg unless 2 or more: age >80, body wt <60kg or SCr >1.5.

## 2019-05-06 NOTE — Telephone Encounter (Signed)
Please review for refill. Thank you! 

## 2019-05-06 NOTE — Telephone Encounter (Signed)
rx has already been responded to.

## 2019-05-06 NOTE — Telephone Encounter (Signed)
Pharmacy requesting refill for Eliquis 5MG   60 tablets 0 refills.

## 2019-05-07 ENCOUNTER — Other Ambulatory Visit: Payer: Self-pay | Admitting: Cardiovascular Disease

## 2019-05-07 NOTE — Telephone Encounter (Signed)
Pt last saw Dr [name] 01/28/19, last labs 12/20/18 Creat 1.8, age 78, weight 73.8kg, based on specified criteria pt is on appropriate dosage of Eliquis 5mg  BID.  Will refill rx.

## 2019-05-07 NOTE — Telephone Encounter (Signed)
Please review for refill, Thanks !  

## 2019-07-01 ENCOUNTER — Telehealth (HOSPITAL_COMMUNITY): Payer: Self-pay | Admitting: Vascular Surgery

## 2019-07-01 NOTE — Telephone Encounter (Signed)
Manuela Schwartz from [facility] called wanted this pt to have  SOONER new pt/ pt is not doing well  appt please call [phone] or [phone]

## 2019-07-08 ENCOUNTER — Encounter: Payer: Medicare Other | Attending: Internal Medicine | Admitting: Internal Medicine

## 2019-07-08 ENCOUNTER — Other Ambulatory Visit: Payer: Self-pay

## 2019-07-08 DIAGNOSIS — I2510 Atherosclerotic heart disease of native coronary artery without angina pectoris: Secondary | ICD-10-CM | POA: Insufficient documentation

## 2019-07-08 DIAGNOSIS — I890 Lymphedema, not elsewhere classified: Secondary | ICD-10-CM | POA: Insufficient documentation

## 2019-07-08 DIAGNOSIS — I130 Hypertensive heart and chronic kidney disease with heart failure and stage 1 through stage 4 chronic kidney disease, or unspecified chronic kidney disease: Secondary | ICD-10-CM | POA: Insufficient documentation

## 2019-07-08 DIAGNOSIS — Z7901 Long term (current) use of anticoagulants: Secondary | ICD-10-CM | POA: Insufficient documentation

## 2019-07-08 DIAGNOSIS — N184 Chronic kidney disease, stage 4 (severe): Secondary | ICD-10-CM | POA: Diagnosis not present

## 2019-07-08 DIAGNOSIS — L97812 Non-pressure chronic ulcer of other part of right lower leg with fat layer exposed: Secondary | ICD-10-CM | POA: Insufficient documentation

## 2019-07-08 DIAGNOSIS — Z8673 Personal history of transient ischemic attack (TIA), and cerebral infarction without residual deficits: Secondary | ICD-10-CM | POA: Diagnosis not present

## 2019-07-08 DIAGNOSIS — I272 Pulmonary hypertension, unspecified: Secondary | ICD-10-CM | POA: Diagnosis not present

## 2019-07-08 DIAGNOSIS — L97822 Non-pressure chronic ulcer of other part of left lower leg with fat layer exposed: Secondary | ICD-10-CM | POA: Insufficient documentation

## 2019-07-08 DIAGNOSIS — I509 Heart failure, unspecified: Secondary | ICD-10-CM | POA: Insufficient documentation

## 2019-07-08 DIAGNOSIS — M1990 Unspecified osteoarthritis, unspecified site: Secondary | ICD-10-CM | POA: Diagnosis not present

## 2019-07-08 DIAGNOSIS — I482 Chronic atrial fibrillation, unspecified: Secondary | ICD-10-CM | POA: Diagnosis not present

## 2019-07-08 DIAGNOSIS — I872 Venous insufficiency (chronic) (peripheral): Secondary | ICD-10-CM | POA: Insufficient documentation

## 2019-07-08 DIAGNOSIS — Z8249 Family history of ischemic heart disease and other diseases of the circulatory system: Secondary | ICD-10-CM | POA: Insufficient documentation

## 2019-07-08 NOTE — Progress Notes (Signed)
Caroline [name], Caroline [name] ([id number]) Visit Report for 07/08/2019 Abuse/Suicide Risk Screen Details Patient Name: Caroline [name], Caroline [name] Date of Service: 07/08/2019 2:15 PM Medical Record Number: [id number] Patient Account Number: [id number] Date of Birth/Sex: [birth year] (77 y.o. F) Treating RN: Montey Hora Primary Care Laurencia Roma: Threasa Alpha Other Clinician: Referring Nazia Rhines: Treating Caiden Monsivais/Extender: Beverly Gust in Treatment: 0 Abuse/Suicide Risk Screen Items Answer ABUSE RISK SCREEN: Has anyone close to you tried to hurt or harm you recentlyo No Do you feel uncomfortable with anyone in your familyo No Has anyone forced you do things that you didnot want to doo No Electronic Signature(s) Signed: 07/08/2019 4:55:00 PM By: Montey Hora Entered By: Montey Hora on 07/08/2019 14:45:06 Caroline [name] ([id number]) -------------------------------------------------------------------------------- Activities of Daily Living Details Patient Name: Caroline [name] Date of Service: 07/08/2019 2:15 PM Medical Record Number: [id number] Patient Account Number: [id number] Date of Birth/Sex: [birth year] (77 y.o. F) Treating RN: Montey Hora Primary Care Jaz Laningham: Threasa Alpha Other Clinician: Referring Randee Upchurch: Treating Sneijder Bernards/Extender: Beverly Gust in Treatment: 0 Activities of Daily Living Items Answer Activities of Daily Living (Please select one for each item) Drive Automobile Completely Able Take Medications Completely Able Use Telephone Completely Able Care for Appearance Completely Able Use Toilet Completely Able Bath / Shower Completely Able Dress Self Completely Able Feed Self Completely Able Walk Completely Able Get In / Out Bed Completely Able Housework Completely Able Prepare Meals Completely [place] for Self Completely Able Electronic Signature(s) Signed: 07/08/2019 4:55:00 PM By: Montey Hora Entered By: Montey Hora on 07/08/2019 14:45:26 Caroline [name] ([id number]) -------------------------------------------------------------------------------- Education Screening Details Patient Name: Caroline [name] Date of Service: 07/08/2019 2:15 PM Medical Record Number: [id number] Patient Account Number: [id number] Date of Birth/Sex: 07-27-1941 (77 y.o. F) Treating RN: Montey Hora Primary Care Nicolai Labonte: Threasa Alpha Other Clinician: Referring Mimi Debellis: Treating Delisha Peaden/Extender: Beverly Gust in Treatment: 0 Primary Learner Assessed: Patient Learning Preferences/Education Level/Primary Language Learning Preference: Explanation, Demonstration Highest Education Level: High School Preferred Language: English Cognitive Barrier Language Barrier: No Translator Needed: No Memory Deficit: No Emotional Barrier: No Cultural/Religious Beliefs Affecting Medical Care: No Physical Barrier Impaired Vision: No Impaired Hearing: No Decreased Hand dexterity: No Knowledge/Comprehension Knowledge Level: Medium Comprehension Level: Medium Ability to understand written Medium instructions: Ability to understand verbal Medium instructions: Motivation Anxiety Level: Calm Cooperation: Cooperative Education Importance: Acknowledges Need Interest in Health Problems: Asks Questions Perception: Coherent Willingness to Engage in Self- Medium Management Activities: Readiness to Engage in Self- Medium Management Activities: Electronic Signature(s) Signed: 07/08/2019 4:55:00 PM By: Montey Hora Entered By: Montey Hora on 07/08/2019 14:45:50 Caroline [name] ([id number]) -------------------------------------------------------------------------------- Fall Risk Assessment Details Patient Name: Caroline [name] Date of Service: 07/08/2019 2:15 PM Medical Record Number: [id number] Patient Account Number: [id number] Date of Birth/Sex: December 23, 1941 (77 y.o. F) Treating RN: Montey Hora Primary Care Tesla Bochicchio: Threasa Alpha Other Clinician: Referring Knowledge Escandon: Treating Simrin Vegh/Extender: Beverly Gust in Treatment: 0 Fall Risk Assessment Items Have you had 2 or more falls in the last 12 monthso 0 No Have you had any fall that resulted in injury in the last 12 monthso 0 No FALLS RISK SCREEN History of falling - immediate or within 3 months 25 Yes Secondary diagnosis (Do you have 2 or more medical diagnoseso) 0 No Ambulatory aid None/bed rest/wheelchair/nurse 0 No Crutches/cane/walker 15 Yes Furniture 0 No Intravenous therapy Access/Saline/Heparin Lock 0 No Gait/Transferring Normal/ bed rest/ wheelchair 0 No Weak (short steps with or without shuffle, stooped but able to lift head  while 10 Yes walking, may seek support from furniture) Impaired (short steps with shuffle, may have difficulty arising from chair, head 0 No down, impaired balance) Mental Status Oriented to own ability 0 Yes Electronic Signature(s) Signed: 07/08/2019 4:55:00 PM By: Montey Hora Entered By: Montey Hora on 07/08/2019 14:46:18 Caroline [name] ([id number]) -------------------------------------------------------------------------------- Foot Assessment Details Patient Name: Caroline [name] Date of Service: 07/08/2019 2:15 PM Medical Record Number: [id number] Patient Account Number: [id number] Date of Birth/Sex: [birth year] (77 y.o. F) Treating RN: Montey Hora Primary Care Maisa Bedingfield: Threasa Alpha Other Clinician: Referring Lorren Splawn: Treating Viggo Perko/Extender: Beverly Gust in Treatment: 0 Foot Assessment Items Site Locations + = Sensation present, - = Sensation absent, C = Callus, U = Ulcer R = Redness, W = Warmth, M = Maceration, PU = Pre-ulcerative lesion F = Fissure, S = Swelling, D = Dryness Assessment Right: Left: Other Deformity: No No Prior Foot Ulcer: No No Prior Amputation: No No Charcot Joint: No No Ambulatory Status: Ambulatory With  Help Assistance Device: Walker Gait: Administrator, arts) Signed: 07/08/2019 4:55:00 PM By: Montey Hora Entered By: Montey Hora on 07/08/2019 15:02:49 Caroline [name] ([id number]) -------------------------------------------------------------------------------- Nutrition Risk Screening Details Patient Name: Caroline [name] Date of Service: 07/08/2019 2:15 PM Medical Record Number: [id number] Patient Account Number: [id number] Date of Birth/Sex: 06/16/1941 (77 y.o. F) Treating RN: Montey Hora Primary Care Juaquina Machnik: Threasa Alpha Other Clinician: Referring Audrey Thull: Treating Seraphim Trow/Extender: Beverly Gust in Treatment: 0 Height (in): 63 Weight (lbs): 155 Body Mass Index (BMI): 27.5 Nutrition Risk Screening Items Score Screening NUTRITION RISK SCREEN: I have an illness or condition that made me change the kind and/or amount of 0 No food I eat I eat fewer than two meals per day 0 No I eat few fruits and vegetables, or milk products 0 No I have three or more drinks of beer, liquor or wine almost every day 0 No I have tooth or mouth problems that make it hard for me to eat 0 No I don't always have enough money to buy the food I need 0 No I eat alone most of the time 0 No I take three or more different prescribed or over-the-counter drugs a day 1 Yes Without wanting to, I have lost or gained 10 pounds in the last six months 0 No I am not always physically able to shop, cook and/or feed myself 0 No Nutrition Protocols Good Risk Protocol 0 No interventions needed Moderate Risk Protocol High Risk Proctocol Risk Level: Good Risk Score: 1 Electronic Signature(s) Signed: 07/08/2019 4:55:00 PM By: Montey Hora Entered By: Montey Hora on 07/08/2019 14:46:26

## 2019-07-09 ENCOUNTER — Telehealth: Payer: Self-pay | Admitting: Cardiovascular Disease

## 2019-07-09 NOTE — Telephone Encounter (Signed)
Patient fell on July 3rd, bruising right hip and now has a wound with no infection being cared for by the wound center. Has tremendous swelling in both legs, patient being referred to [facility] (patients daughter will be faxing a message along with this note). Daughter would like you to look at the fax before she is called.  Pt c/o swelling: STAT is pt has developed SOB within 24 hours  1) How much weight have you gained and in what time span? na  2) If swelling, where is the swelling located? Lower extremities   3) Are you currently taking a fluid pill? Yes, daughter has questions about this  4) Are you currently SOB? yes  5) Do you have a log of your daily weights (if so, list)? na  6) Have you gained 3 pounds in a day or 5 pounds in a week? Daughter believes so  7) Have you traveled recently? no

## 2019-07-09 NOTE — Progress Notes (Signed)
RUMOR, SUN ([id number]) Visit Report for 07/08/2019 Chief Complaint Document Details Patient Name: Caroline [name], Caroline [name] Date of Service: 07/08/2019 2:15 PM Medical Record Number: [id number] Patient Account Number: [id number] Date of Birth/Sex: 07-08-1941 (77 y.o. F) Treating RN: Harold Barban Primary Care Provider: Threasa Alpha Other Clinician: Referring Provider: Treating Provider/Extender: Beverly Gust in Treatment: 0 Information Obtained from: Patient Chief Complaint Weeping leg edema with skin breakdown right leg Electronic Signature(s) Signed: 07/08/2019 3:30:04 PM By: Tobi Bastos Entered By: Tobi Bastos on 07/08/2019 15:30:04 Caroline [name] ([id number]) -------------------------------------------------------------------------------- HPI Details Patient Name: Caroline [name] Date of Service: 07/08/2019 2:15 PM Medical Record Number: [id number] Patient Account Number: [id number] Date of Birth/Sex: 14-Dec-1941 (77 y.o. F) Treating RN: Harold Barban Primary Care Provider: Threasa Alpha Other Clinician: Referring Provider: Treating Provider/Extender: Beverly Gust in Treatment: 0 History of Present Illness HPI Description: Pleasant 78 year old Caucasian female with history of leg edema, newly enrolled in the congestive heart failure clinic, pulmonary hypertension, chronic kidney disease stage IV, chronic A. fib, oral anticoagulation with Eliquis, presenting with increased leg edema and weeping from both legs, with recent skin tear on right lower extremity anterior leg caused by scratching according to patient. Patient also had a fall about 2 weeks ago and pulled her "groin muscle" after which she has had some difficulty walking and uses a walker. Patient actually presenting to the wound clinic for the right leg skin tear for which she was actually placed on Bactrim for 7 days that she is completing at this time. Patient is accompanied by her son  and both are concerned that there may be infection in the right leg given skin changes they have noticed. Electronic Signature(s) Signed: 07/08/2019 3:32:26 PM By: Tobi Bastos Entered By: Tobi Bastos on 07/08/2019 15:32:26 Caroline [name] ([id number]) -------------------------------------------------------------------------------- Physical Exam Details Patient Name: Caroline [name] Date of Service: 07/08/2019 2:15 PM Medical Record Number: [id number] Patient Account Number: [id number] Date of Birth/Sex: [birth year] (77 y.o. F) Treating RN: Harold Barban Primary Care Provider: Threasa Alpha Other Clinician: Referring Provider: Treating Provider/Extender: Beverly Gust in Treatment: 0 Eyes conjunctiva clear no eyelid edema noted. pupils equal round and reactive to light and accommodation. Ears, Nose, Mouth, and Throat no gross abnormality of ear auricles or external auditory canals. normal hearing noted during conversation. mucus membranes moist. Neck supple with no LAD noted in anterior or posterior cervical chain. not enlarged. Respiratory normal breathing without difficulty. clear to auscultation bilaterally. Cardiovascular regular rate and rhythm with normal S1, S2. no bruits with no significant JVD. 2+ femoral pulses. 2+ dorsalis pedis/posterior tibialis pulses. no clubbing, cyanosis, significant edema, <3 sec cap refill. Gastrointestinal (GI) soft, non-tender, non-distended, +BS. no hepatosplenomegaly. no ventral hernia noted. Neurological cranial nerves 2-12 intact. Patient has normal sensation in the feet bilaterally to light touch. Psychiatric this patient is able to make decisions and demonstrates good insight into disease process. Alert and Oriented x 3. pleasant and cooperative. Notes Patient has a small skin tear on the right anterior leg, there are some surrounding skin changes suggesting stasis dermatitis, the left leg is equally enlarged also with  some weeping, there is also stasis changes with pigmentation on the left leg. Electronic Signature(s) Signed: 07/08/2019 3:34:01 PM By: Tobi Bastos Entered By: Tobi Bastos on 07/08/2019 15:34:00 Caroline [name] ([id number]) -------------------------------------------------------------------------------- Physician Orders Details Patient Name: Caroline [name] Date of Service: 07/08/2019 2:15 PM Medical Record Number: [id number] Patient Account Number: [id number] Date of Birth/Sex: 19-Mar-1941 (77 y.o. F) Treating RN:  Harold Barban Primary Care Provider: Threasa Alpha Other Clinician: Referring Provider: Treating Provider/Extender: Beverly Gust in Treatment: 0 Verbal / Phone Orders: No Diagnosis Coding Wound Cleansing Wound #1 Right,Anterior Lower Leg o May shower with protection. - Can cover wraps with plastic bags Primary Wound Dressing Wound #1 Right,Anterior Lower Leg o Xeroform Secondary Dressing Wound #1 Right,Anterior Lower Leg o ABD pad Dressing Change Frequency Wound #1 Right,Anterior Lower Leg o Change dressing every week Follow-up Appointments Wound #1 Right,Anterior Lower Leg o Other: - Return Thursday, July 16th Edema Control Wound #1 Right,Anterior Lower Leg o Kerlix and Coban - Bilateral Off-Loading Wound #1 Right,Anterior Lower Leg o Turn and reposition every 2 hours - Elevate legs when sitting Services and Therapies o Arterial Studies- Bilateral - Arterial Study with ABI and TBI Electronic Signature(s) Signed: 07/08/2019 5:21:59 PM By: Harold Barban Signed: 07/09/2019 8:15:47 AM By: Tobi Bastos Entered By: Harold Barban on 07/08/2019 15:26:11 Caroline [name] ([id number]) -------------------------------------------------------------------------------- Progress Note Details Patient Name: Caroline [name] Date of Service: 07/08/2019 2:15 PM Medical Record Number: [id number] Patient Account Number: [id number] Date  of Birth/Sex: 29-Feb-1941 (77 y.o. F) Treating RN: Harold Barban Primary Care Provider: Threasa Alpha Other Clinician: Referring Provider: Treating Provider/Extender: Beverly Gust in Treatment: 0 Subjective Chief Complaint Information obtained from Patient Weeping leg edema with skin breakdown right leg History of Present Illness (HPI) Pleasant 78 year old Caucasian female with history of leg edema, newly enrolled in the congestive heart failure clinic, pulmonary hypertension, chronic kidney disease stage IV, chronic A. fib, oral anticoagulation with Eliquis, presenting with increased leg edema and weeping from both legs, with recent skin tear on right lower extremity anterior leg caused by scratching according to patient. Patient also had a fall about 2 weeks ago and pulled her "groin muscle" after which she has had some difficulty walking and uses a walker. Patient actually presenting to the wound clinic for the right leg skin tear for which she was actually placed on Bactrim for 7 days that she is completing at this time. Patient is accompanied by her son and both are concerned that there may be infection in the right leg given skin changes they have noticed. Patient History Information obtained from Patient. Allergies No Known Drug Allergies Family History Heart Disease - Father, Hypertension - Father, Lung Disease - Siblings, No family history of Cancer, Diabetes, Hereditary Spherocytosis, Kidney Disease, Seizures, Stroke, Thyroid Problems, Tuberculosis. Social History Never smoker, Marital Status - Married, Alcohol Use - Rarely, Drug Use - No History, Caffeine Use - Daily. Medical History Respiratory Denies history of Aspiration, Asthma, Chronic Obstructive Pulmonary Disease (COPD), Pneumothorax, Sleep Apnea, Tuberculosis Cardiovascular Patient has history of Congestive Heart Failure, Coronary Artery Disease, Hypertension Denies history of Angina, Arrhythmia, Deep  Vein Thrombosis, Hypotension, Myocardial Infarction, Peripheral Arterial Disease, Peripheral Venous Disease, Phlebitis, Vasculitis Integumentary (Skin) Denies history of History of Burn, History of pressure wounds Musculoskeletal Patient has history of Gout, Osteoarthritis Denies history of Rheumatoid Arthritis, Osteomyelitis Neurologic Denies history of Dementia, Neuropathy, Quadriplegia, Paraplegia, Seizure Disorder KENISHIA, PLACK ([id number]) Medical And Surgical History Notes Respiratory Pulmonary HTN Cardiovascular HLD Neurologic TIA 2018 Review of Systems (ROS) Constitutional Symptoms (General Health) Denies complaints or symptoms of Fatigue, Fever, Chills, Marked Weight Change. Eyes Denies complaints or symptoms of Dry Eyes, Vision Changes, Glasses / Contacts. Ear/Nose/Mouth/Throat Denies complaints or symptoms of Difficult clearing ears, Sinusitis. Hematologic/Lymphatic Denies complaints or symptoms of Bleeding / Clotting Disorders, Human Immunodeficiency Virus. Respiratory Denies complaints or symptoms of Chronic or frequent  coughs, Shortness of Breath. Cardiovascular Complains or has symptoms of LE edema. Denies complaints or symptoms of Chest pain. Gastrointestinal Denies complaints or symptoms of Frequent diarrhea, Nausea, Vomiting. Endocrine Denies complaints or symptoms of Hepatitis, Thyroid disease, Polydypsia (Excessive Thirst). Genitourinary Denies complaints or symptoms of Kidney failure/ Dialysis, Incontinence/dribbling. Immunological Denies complaints or symptoms of Hives, Itching. Integumentary (Skin) Complains or has symptoms of Wounds. Denies complaints or symptoms of Bleeding or bruising tendency, Breakdown, Swelling. Musculoskeletal Denies complaints or symptoms of Muscle Pain, Muscle Weakness. Neurologic Denies complaints or symptoms of Numbness/parasthesias, Focal/Weakness. Psychiatric Denies complaints or symptoms of Anxiety,  Claustrophobia. Objective Constitutional Vitals Time Taken: 2:39 PM, Height: 63 in, Source: Measured, Weight: 155 lbs, Source: Measured, BMI: 27.5, Temperature: 98.2 F, Pulse: 67 bpm, Respiratory Rate: 16 breaths/min, Blood Pressure: 146/94 mmHg. Eyes conjunctiva clear no eyelid edema noted. pupils equal round and reactive to light and accommodation. Ears, Nose, Mouth, and Throat Nilson, Latyra K. ([id number]) no gross abnormality of ear auricles or external auditory canals. normal hearing noted during conversation. mucus membranes moist. Neck supple with no LAD noted in anterior or posterior cervical chain. not enlarged. Respiratory normal breathing without difficulty. clear to auscultation bilaterally. Cardiovascular regular rate and rhythm with normal S1, S2. no bruits with no significant JVD. 2+ femoral pulses. 2+ dorsalis pedis/posterior tibialis pulses. no clubbing, cyanosis, significant edema, Gastrointestinal (GI) soft, non-tender, non-distended, +BS. no hepatosplenomegaly. no ventral hernia noted. Neurological cranial nerves 2-12 intact. Patient has normal sensation in the feet bilaterally to light touch. Psychiatric this patient is able to make decisions and demonstrates good insight into disease process. Alert and Oriented x 3. pleasant and cooperative. General Notes: Patient has a small skin tear on the right anterior leg, there are some surrounding skin changes suggesting stasis dermatitis, the left leg is equally enlarged also with some weeping, there is also stasis changes with pigmentation on the left leg. Integumentary (Hair, Skin) Wound #1 status is Open. Original cause of wound was Trauma. The wound is located on the Right,Anterior Lower Leg. The wound measures 3.8cm length x 1.5cm width x 0.1cm depth; 4.477cm^2 area and 0.448cm^3 volume. There is Fat Layer (Subcutaneous Tissue) Exposed exposed. There is no tunneling or undermining noted. There is a large amount of  serous drainage noted. The wound margin is flat and intact. There is small (1-33%) pink granulation within the wound bed. There is a large (67-100%) amount of necrotic tissue within the wound bed including Adherent Slough. Plan Wound Cleansing: Wound #1 Right,Anterior Lower Leg: May shower with protection. - Can cover wraps with plastic bags Primary Wound Dressing: Wound #1 Right,Anterior Lower Leg: Xeroform Secondary Dressing: Wound #1 Right,Anterior Lower Leg: ABD pad Dressing Change Frequency: Wound #1 Right,Anterior Lower Leg: Change dressing every week Follow-up Appointments: Wound #1 Right,Anterior Lower Leg: Other: - Return Thursday, July 16th Edema Control: Wound #1 Right,Anterior Lower Leg: Kerlix and Coban - Bilateral Off-Loading: ANIE, JUNIEL. ([id number]) Wound #1 Right,Anterior Lower Leg: Turn and reposition every 2 hours - Elevate legs when sitting Services and Therapies ordered were: Arterial Studies- Bilateral - Arterial Study with ABI and TBI 1. Patient with venous stasis, with significant edema probably related to worsening diastolic heart failure 2. Most of the problem seems to be related to drainage from the legs due to hydrostatic pressure which has become a problem over the past 2 weeks and a skin tear that is incidental 3. We will try Adaptic or similar dressing over the skin tear , 2 layer Coban and  return to clinic in 4 days for check given the ABIs of 0. 7 5 in the right leg and 0. in t 8 1 he left referral to vascular clinic for ABI and TBI determination is warranted 4. Patient has referral to heart failure clinic where I believe more aggressive edema management can be resumed 5. Patient has chronic kidney disease which also will impact the above 6. Patient is following restrictions for fluid intake have also advised elevation of legs when sitting. Electronic Signature(s) Signed: 07/08/2019 3:49:27 PM By: Tobi Bastos Entered By: Tobi Bastos  on 07/08/2019 15:49:26 Caroline [name] ([id number]) -------------------------------------------------------------------------------- ROS/PFSH Details Patient Name: Caroline [name] Date of Service: 07/08/2019 2:15 PM Medical Record Number: [id number] Patient Account Number: [id number] Date of Birth/Sex: 05-20-1941 (77 y.o. F) Treating RN: Montey Hora Primary Care Provider: Threasa Alpha Other Clinician: Referring Provider: Treating Provider/Extender: Beverly Gust in Treatment: 0 Information Obtained From Patient Constitutional Symptoms (General Health) Complaints and Symptoms: Negative for: Fatigue; Fever; Chills; Marked Weight Change Eyes Complaints and Symptoms: Negative for: Dry Eyes; Vision Changes; Glasses / Contacts Ear/Nose/Mouth/Throat Complaints and Symptoms: Negative for: Difficult clearing ears; Sinusitis Hematologic/Lymphatic Complaints and Symptoms: Negative for: Bleeding / Clotting Disorders; Human Immunodeficiency Virus Respiratory Complaints and Symptoms: Negative for: Chronic or frequent coughs; Shortness of Breath Medical History: Negative for: Aspiration; Asthma; Chronic Obstructive Pulmonary Disease (COPD); Pneumothorax; Sleep Apnea; Tuberculosis Past Medical History Notes: Pulmonary HTN Cardiovascular Complaints and Symptoms: Positive for: LE edema Negative for: Chest pain Medical History: Positive for: Congestive Heart Failure; Coronary Artery Disease; Hypertension Negative for: Angina; Arrhythmia; Deep Vein Thrombosis; Hypotension; Myocardial Infarction; Peripheral Arterial Disease; Peripheral Venous Disease; Phlebitis; Vasculitis Past Medical History Notes: HLD Gastrointestinal JONNELLE, LAWNICZAK. ([id number]) Complaints and Symptoms: Negative for: Frequent diarrhea; Nausea; Vomiting Endocrine Complaints and Symptoms: Negative for: Hepatitis; Thyroid disease; Polydypsia (Excessive Thirst) Genitourinary Complaints and  Symptoms: Negative for: Kidney failure/ Dialysis; Incontinence/dribbling Immunological Complaints and Symptoms: Negative for: Hives; Itching Integumentary (Skin) Complaints and Symptoms: Positive for: Wounds Negative for: Bleeding or bruising tendency; Breakdown; Swelling Medical History: Negative for: History of Burn; History of pressure wounds Musculoskeletal Complaints and Symptoms: Negative for: Muscle Pain; Muscle Weakness Medical History: Positive for: Gout; Osteoarthritis Negative for: Rheumatoid Arthritis; Osteomyelitis Neurologic Complaints and Symptoms: Negative for: Numbness/parasthesias; Focal/Weakness Medical History: Negative for: Dementia; Neuropathy; Quadriplegia; Paraplegia; Seizure Disorder Past Medical History Notes: TIA 2018 Psychiatric Complaints and Symptoms: Negative for: Anxiety; Claustrophobia Immunizations Pneumococcal Vaccine: Received Pneumococcal Vaccination: Yes Implantable Devices None Family and Social History HESSIE, VARONE ([id number]) Cancer: No; Diabetes: No; Heart Disease: Yes - Father; Hereditary Spherocytosis: No; Hypertension: Yes - Father; Kidney Disease: No; Lung Disease: Yes - Siblings; Seizures: No; Stroke: No; Thyroid Problems: No; Tuberculosis: No; Never smoker; Marital Status - Married; Alcohol Use: Rarely; Drug Use: No History; Caffeine Use: Daily; Financial Concerns: No; Food, Clothing or Shelter Needs: No; Support System Lacking: No; Transportation Concerns: No Electronic Signature(s) Signed: 07/08/2019 4:55:00 PM By: Montey Hora Signed: 07/09/2019 8:15:47 AM By: Tobi Bastos Entered By: Montey Hora on 07/08/2019 15:07:36 Caroline [name] ([id number]) -------------------------------------------------------------------------------- [place] Details Patient Name: Caroline [name] Date of Service: 07/08/2019 Medical Record Number: [id number] Patient Account Number: [id number] Date of Birth/Sex: May 27, 1941 (77 y.o.  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

## 2019-07-09 NOTE — Progress Notes (Signed)
YAMAIRA, SPINNER ([id number]) Visit Report for 07/08/2019 Allergy List Details Patient Name: Caroline [name], Caroline [name] Date of Service: 07/08/2019 2:15 PM Medical Record Number: [id number] Patient Account Number: [id number] Date of Birth/Sex: Mar 23, 1941 (77 y.o. F) Treating RN: Montey Hora Primary Care Jolita Haefner: Threasa Alpha Other Clinician: Referring Nandan Willems: Treating Kolyn Rozario/Extender: Beverly Gust in Treatment: 0 Allergies Active Allergies No Known Drug Allergies Allergy Notes Electronic Signature(s) Signed: 07/08/2019 4:55:00 PM By: Montey Hora Entered By: Montey Hora on 07/08/2019 14:44:57 Caroline [name] ([id number]) -------------------------------------------------------------------------------- Arrival Information Details Patient Name: Caroline [name] Date of Service: 07/08/2019 2:15 PM Medical Record Number: [id number] Patient Account Number: [id number] Date of Birth/Sex: April 08, 1941 (77 y.o. F) Treating RN: Montey Hora Primary Care Gissell Barra: Threasa Alpha Other Clinician: Referring Jennifermarie Franzen: Treating Gloyd Happ/Extender: Beverly Gust in Treatment: 0 Visit Information Patient Arrived: Wheel Chair Arrival Time: 14:38 Accompanied By: son Transfer Assistance: None Patient Identification Verified: Yes Secondary Verification Process Yes Completed: Patient Has Alerts: Yes Patient Alerts: Patient on Blood Thinner Eliquis Electronic Signature(s) Signed: 07/08/2019 4:55:00 PM By: Montey Hora Entered By: Montey Hora on 07/08/2019 14:38:54 Caroline [name] ([id number]) -------------------------------------------------------------------------------- Clinic Level of Care Assessment Details Patient Name: Caroline [name] Date of Service: 07/08/2019 2:15 PM Medical Record Number: [id number] Patient Account Number: [id number] Date of Birth/Sex: [birth year] (77 y.o. F) Treating RN: Harold Barban Primary Care Milagros Middendorf: Threasa Alpha Other  Clinician: Referring Artavius Stearns: Treating Faiza Bansal/Extender: Beverly Gust in Treatment: 0 Clinic Level of Care Assessment Items TOOL 2 Quantity Score []  - Use when only an EandM is performed on the INITIAL visit 0 ASSESSMENTS - Nursing Assessment / Reassessment X - General Physical Exam (combine w/ comprehensive assessment (listed just below) when 1 20 performed on new pt. evals) X- 1 25 Comprehensive Assessment (HX, ROS, Risk Assessments, Wounds Hx, etc.) ASSESSMENTS - Wound and Skin Assessment / Reassessment X - Simple Wound Assessment / Reassessment - one wound 1 5 []  - 0 Complex Wound Assessment / Reassessment - multiple wounds []  - 0 Dermatologic / Skin Assessment (not related to wound area) ASSESSMENTS - Ostomy and/or Continence Assessment and Care []  - Incontinence Assessment and Management 0 []  - 0 Ostomy Care Assessment and Management (repouching, etc.) PROCESS - Coordination of Care X - Simple Patient / Family Education for ongoing care 1 15 []  - 0 Complex (extensive) Patient / Family Education for ongoing care []  - 0 Staff obtains Programmer, systems, Records, Test Results / Process Orders []  - 0 Staff telephones HHA, Nursing Homes / Clarify orders / etc []  - 0 Routine Transfer to another Facility (non-emergent condition) []  - 0 Routine Hospital Admission (non-emergent condition) []  - 0 New Admissions / Biomedical engineer / Ordering NPWT, Apligraf, etc. []  - 0 Emergency Hospital Admission (emergent condition) X- 1 10 Simple Discharge Coordination []  - 0 Complex (extensive) Discharge Coordination PROCESS - Special Needs []  - Pediatric / Minor Patient Management 0 []  - 0 Isolation Patient Management HALIE, GASS ([id number]) []  - 0 Hearing / Language / Visual special needs []  - 0 Assessment of Community assistance (transportation, D/C planning, etc.) []  - 0 Additional assistance / Altered mentation []  - 0 Support Surface(s) Assessment (bed,  cushion, seat, etc.) INTERVENTIONS - Wound Cleansing / Measurement X - Wound Imaging (photographs - any number of wounds) 1 5 []  - 0 Wound Tracing (instead of photographs) X- 1 5 Simple Wound Measurement - one wound []  - 0 Complex Wound Measurement - multiple wounds X- 1 5 Simple Wound Cleansing - one wound []  -  0 Complex Wound Cleansing - multiple wounds INTERVENTIONS - Wound Dressings X - Small Wound Dressing one or multiple wounds 1 10 []  - 0 Medium Wound Dressing one or multiple wounds []  - 0 Large Wound Dressing one or multiple wounds []  - 0 Application of Medications - injection INTERVENTIONS - Miscellaneous []  - External ear exam 0 []  - 0 Specimen Collection (cultures, biopsies, blood, body fluids, etc.) []  - 0 Specimen(s) / Culture(s) sent or taken to Lab for analysis []  - 0 Patient Transfer (multiple staff / Civil Service fast streamer / Similar devices) []  - 0 Simple Staple / Suture removal (25 or less) []  - 0 Complex Staple / Suture removal (26 or more) []  - 0 Hypo / Hyperglycemic Management (close monitor of Blood Glucose) X- 1 15 Ankle / Brachial Index (ABI) - do not check if billed separately Has the patient been seen at the hospital within the last three years: Yes Total Score: 115 Level Of Care: New/Established - Level 3 Electronic Signature(s) Signed: 07/08/2019 5:21:59 PM By: Harold Barban Entered By: Harold Barban on 07/08/2019 15:11:41 Caroline [name] ([id number]) -------------------------------------------------------------------------------- Encounter Discharge Information Details Patient Name: Caroline [name] Date of Service: 07/08/2019 2:15 PM Medical Record Number: [id number] Patient Account Number: [id number] Date of Birth/Sex: [birth year] (77 y.o. F) Treating RN: Army Melia Primary Care Dontre Laduca: Threasa Alpha Other Clinician: Referring Korin Setzler: Treating Radwan Cowley/Extender: Beverly Gust in Treatment: 0 Encounter Discharge Information  Items Discharge Condition: Stable Ambulatory Status: Wheelchair Discharge Destination: Home Transportation: Private Auto Accompanied By: son Schedule Follow-up Appointment: Yes Clinical Summary of Care: Electronic Signature(s) Signed: 07/09/2019 10:16:02 AM By: Army Melia Entered By: Army Melia on 07/08/2019 15:39:37 Caroline [name] ([id number]) -------------------------------------------------------------------------------- Lower Extremity Assessment Details Patient Name: Caroline [name] Date of Service: 07/08/2019 2:15 PM Medical Record Number: [id number] Patient Account Number: [id number] Date of Birth/Sex: 08-14-1941 (77 y.o. F) Treating RN: Montey Hora Primary Care Kaiyden Simkin: Threasa Alpha Other Clinician: Referring Zakyah Yanes: Treating Nelton Amsden/Extender: Beverly Gust in Treatment: 0 Edema Assessment Assessed: [Left: No] [Right: No] Edema: [Left: Yes] [Right: Yes] Calf Left: Right: Point of Measurement: 29 cm From Medial Instep 37.5 cm 36 cm Ankle Left: Right: Point of Measurement: 11 cm From Medial Instep 24.2 cm 23 cm Vascular Assessment Pulses: Dorsalis Pedis Palpable: [Left:Yes] [Right:Yes] Doppler Audible: [Left:Yes] [Right:Yes] Posterior Tibial Palpable: [Left:Yes] [Right:Yes] Doppler Audible: [Left:Yes] [Right:Yes] Blood Pressure: Brachial: [Left:146] Dorsalis Pedis: 80 [Left:Dorsalis Pedis: 110] Ankle: Posterior Tibial: 118 [Left:Posterior Tibial: 98 0.81] [Right:0.75] Electronic Signature(s) Signed: 07/08/2019 4:55:00 PM By: Montey Hora Entered By: Montey Hora on 07/08/2019 15:02:28 Caroline [name] ([id number]) -------------------------------------------------------------------------------- Multi Wound Chart Details Patient Name: Caroline [name] Date of Service: 07/08/2019 2:15 PM Medical Record Number: [id number] Patient Account Number: [id number] Date of Birth/Sex: 09-04-1941 (77 y.o. F) Treating RN: Harold Barban Primary  Care Amit Meloy: Threasa Alpha Other Clinician: Referring Hermilo Dutter: Treating Isley Weisheit/Extender: Beverly Gust in Treatment: 0 Vital Signs Height(in): 63 Pulse(bpm): 109 Weight(lbs): 155 Blood Pressure(mmHg): 146/94 Body Mass Index(BMI): 27 Temperature(F): 98.2 Respiratory Rate 16 (breaths/min): Photos: [N/A:N/A] Wound Location: Right Lower Leg - Anterior N/A N/A Wounding Event: Trauma N/A N/A Primary Etiology: Venous Leg Ulcer N/A N/A Comorbid History: Coronary Artery Disease, N/A N/A Hypertension, Osteoarthritis Date Acquired: 06/24/2019 N/A N/A Weeks of Treatment: 0 N/A N/A Wound Status: Open N/A N/A Measurements L x W x D 3.8x1.5x0.1 N/A N/A (cm) Area (cm) : 4.477 N/A N/A Volume (cm) : 0.448 N/A N/A Classification: Full Thickness Without N/A N/A Exposed Support Structures Exudate Amount: Large N/A  N/A Exudate Type: Serous N/A N/A Exudate Color: amber N/A N/A Wound Margin: Flat and Intact N/A N/A Granulation Amount: Small (1-33%) N/A N/A Granulation Quality: Pink N/A N/A Necrotic Amount: Large (67-100%) N/A N/A Exposed Structures: Fat Layer (Subcutaneous N/A N/A Tissue) Exposed: Yes Fascia: No Tendon: No Muscle: No Joint: No Bone: No Epithelialization: None N/A N/A ARBIE, REISZ ([id number]) Treatment Notes Electronic Signature(s) Signed: 07/08/2019 5:21:59 PM By: Harold Barban Entered By: Harold Barban on 07/08/2019 15:09:50 Caroline [name] ([id number]) -------------------------------------------------------------------------------- Multi-Disciplinary Care Plan Details Patient Name: Caroline [name] Date of Service: 07/08/2019 2:15 PM Medical Record Number: [id number] Patient Account Number: [id number] Date of Birth/Sex: 1941-05-20 (77 y.o. F) Treating RN: Harold Barban Primary Care Keller Mikels: Threasa Alpha Other Clinician: Referring Snigdha Howser: Treating Mana Morison/Extender: Beverly Gust in Treatment: 0 Active Inactive Venous  Leg Ulcer Nursing Diagnoses: Knowledge deficit related to disease process and management Goals: Patient will maintain optimal edema control Date Initiated: 07/08/2019 Target Resolution Date: 08/08/2019 Goal Status: Active Interventions: Assess peripheral edema status every visit. Treatment Activities: Non-invasive vascular studies : 07/08/2019 Notes: Wound/Skin Impairment Nursing Diagnoses: Impaired tissue integrity Goals: Ulcer/skin breakdown will have a volume reduction of 30% by week 4 Date Initiated: 07/08/2019 Target Resolution Date: 08/08/2019 Goal Status: Active Interventions: Assess patient/caregiver ability to obtain necessary supplies Assess patient/caregiver ability to perform ulcer/skin care regimen upon admission and as needed Assess ulceration(s) every visit Notes: Electronic Signature(s) Signed: 07/08/2019 5:21:59 PM By: Harold Barban Entered By: Harold Barban on 07/08/2019 15:09:17 Caroline [name] ([id number]) -------------------------------------------------------------------------------- Pain Assessment Details Patient Name: Caroline [name] Date of Service: 07/08/2019 2:15 PM Medical Record Number: [id number] Patient Account Number: [id number] Date of Birth/Sex: Sep 26, 1941 (77 y.o. F) Treating RN: Montey Hora Primary Care Courtnay Petrilla: Threasa Alpha Other Clinician: Referring Shinika Estelle: Treating Capricia Serda/Extender: Beverly Gust in Treatment: 0 Active Problems Location of Pain Severity and Description of Pain Patient Has Paino No Site Locations Pain Management and Medication Current Pain Management: Electronic Signature(s) Signed: 07/08/2019 4:55:00 PM By: Montey Hora Entered By: Montey Hora on 07/08/2019 14:39:01 Caroline [name] ([id number]) -------------------------------------------------------------------------------- Patient/Caregiver Education Details Patient Name: Caroline [name] Date of Service: 07/08/2019 2:15 PM Medical  Record Number: [id number] Patient Account Number: [id number] Date of Birth/Gender: 24-Oct-1941 (77 y.o. F) Treating RN: Harold Barban Primary Care Physician: Threasa Alpha Other Clinician: Referring Physician: Treating Physician/Extender: Beverly Gust in Treatment: 0 Education Assessment Education Provided To: Patient Education Topics Provided Venous: Handouts: Controlling Swelling with Compression Stockings Methods: Demonstration, Explain/Verbal Responses: State content correctly Wound/Skin Impairment: Handouts: Caring for Your Ulcer Methods: Demonstration, Explain/Verbal Responses: State content correctly Electronic Signature(s) Signed: 07/08/2019 5:21:59 PM By: Harold Barban Entered By: Harold Barban on 07/08/2019 15:10:18 Caroline [name] ([id number]) -------------------------------------------------------------------------------- Wound Assessment Details Patient Name: Caroline [name] Date of Service: 07/08/2019 2:15 PM Medical Record Number: [id number] Patient Account Number: [id number] Date of Birth/Sex: April 09, 1941 (77 y.o. F) Treating RN: Montey Hora Primary Care Lowell Makara: Threasa Alpha Other Clinician: Referring Jery Hollern: Treating Aydeen Blume/Extender: Beverly Gust in Treatment: 0 Wound Status Wound Number: 1 Primary Venous Leg Ulcer Etiology: Wound Location: Right Lower Leg - Anterior Wound Status: Open Wounding Event: Trauma Comorbid Coronary Artery Disease, Hypertension, Date Acquired: 06/24/2019 History: Osteoarthritis Weeks Of Treatment: 0 Clustered Wound: No Photos Wound Measurements Length: (cm) 3.8 Width: (cm) 1.5 Depth: (cm) 0.1 Area: (cm) 4.477 Volume: (cm) 0.448 % Reduction in Area: % Reduction in Volume: Epithelialization: None Tunneling: No Undermining: No Wound Description Full Thickness Without Exposed Support Classification: Structures Wound Margin: Flat and  Intact Exudate Large Amount: Exudate Type:  Serous Exudate Color: amber Foul Odor After Cleansing: No Slough/Fibrino Yes Wound Bed Granulation Amount: Small (1-33%) Exposed Structure Granulation Quality: Pink Fascia Exposed: No Necrotic Amount: Large (67-100%) Fat Layer (Subcutaneous Tissue) Exposed: Yes Necrotic Quality: Adherent Slough Tendon Exposed: No Muscle Exposed: No Joint Exposed: No Bone Exposed: No Bourke, Baylie K. ([id number]) Treatment Notes Wound #1 (Right, Anterior Lower Leg) Notes xeroform, ABD, kerlix, coban Electronic Signature(s) Signed: 07/08/2019 4:55:00 PM By: Montey Hora Entered By: Montey Hora on 07/08/2019 14:54:15 Caroline [name] ([id number]) -------------------------------------------------------------------------------- Vitals Details Patient Name: Caroline [name] Date of Service: 07/08/2019 2:15 PM Medical Record Number: [id number] Patient Account Number: [id number] Date of Birth/Sex: [birth year] (77 y.o. F) Treating RN: Montey Hora Primary Care Thuy Atilano: Threasa Alpha Other Clinician: Referring Arrion Burruel: Treating Bebe Moncure/Extender: Beverly Gust in Treatment: 0 Vital Signs Time Taken: 14:39 Temperature (F): 98.2 Height (in): 63 Pulse (bpm): 67 Source: Measured Respiratory Rate (breaths/min): 16 Weight (lbs): 155 Blood Pressure (mmHg): 146/94 Source: Measured Reference Range: 80 - 120 mg / dl Body Mass Index (BMI): 27.5 Electronic Signature(s) Signed: 07/08/2019 4:55:00 PM By: Montey Hora Entered By: Montey Hora on 07/08/2019 14:44:11

## 2019-07-11 ENCOUNTER — Encounter: Payer: Medicare Other | Admitting: Physician Assistant

## 2019-07-11 ENCOUNTER — Other Ambulatory Visit: Payer: Self-pay

## 2019-07-11 DIAGNOSIS — L97812 Non-pressure chronic ulcer of other part of right lower leg with fat layer exposed: Secondary | ICD-10-CM | POA: Diagnosis not present

## 2019-07-11 NOTE — Progress Notes (Signed)
AMATULLAH, CHRISTY ([id number]) Visit Report for 07/11/2019 Arrival Information Details Patient Name: Caroline [name], Caroline [name] Date of Service: 07/11/2019 8:00 AM Medical Record Number: [id number] Patient Account Number: [id number] Date of Birth/Sex: 20-Oct-1941 (77 y.o. F) Treating RN: Cornell Barman Primary Care Ethie Curless: Threasa Alpha Other Clinician: Referring Karesa Maultsby: Threasa Alpha Treating Marketa Midkiff/Extender: Melburn Hake, HOYT Weeks in Treatment: 0 Visit Information History Since Last Visit Added or deleted any medications: No Patient Arrived: Wheel Chair Any new allergies or adverse reactions: No Arrival Time: 08:05 Had a fall or experienced change in No Accompanied By: husband activities of daily living that may affect Transfer Assistance: Manual risk of falls: Patient Identification Verified: Yes Signs or symptoms of abuse/neglect since last visito No Secondary Verification Process Yes Hospitalized since last visit: No Completed: Implantable device outside of the clinic excluding No Patient Has Alerts: Yes cellular tissue based products placed in the center Patient Alerts: Patient on Blood since last visit: Thinner Has Dressing in Place as Prescribed: Yes Eliquis Pain Present Now: No Electronic Signature(s) Signed: 07/11/2019 8:32:05 AM By: Gretta Cool, BSN, RN, CWS, Kim RN, BSN Entered By: Gretta Cool, BSN, RN, CWS, Kim on 07/11/2019 08:32:04 Caroline [name] ([id number]) -------------------------------------------------------------------------------- Clinic Level of Care Assessment Details Patient Name: Caroline [name] Date of Service: 07/11/2019 8:00 AM Medical Record Number: [id number] Patient Account Number: [id number] Date of Birth/Sex: 11-21-1941 (77 y.o. F) Treating RN: Cornell Barman Primary Care Jaymar Loeber: Threasa Alpha Other Clinician: Referring Rexann Lueras: Threasa Alpha Treating Cambren Helm/Extender: Melburn Hake, HOYT Weeks in Treatment: 0 Clinic Level of Care Assessment Items TOOL  4 Quantity Score []  - Use when only an EandM is performed on FOLLOW-UP visit 0 ASSESSMENTS - Nursing Assessment / Reassessment []  - Reassessment of Co-morbidities (includes updates in patient status) 0 []  - 0 Reassessment of Adherence to Treatment Plan ASSESSMENTS - Wound and Skin Assessment / Reassessment X - Simple Wound Assessment / Reassessment - one wound 1 5 []  - 0 Complex Wound Assessment / Reassessment - multiple wounds []  - 0 Dermatologic / Skin Assessment (not related to wound area) ASSESSMENTS - Focused Assessment []  - Circumferential Edema Measurements - multi extremities 0 []  - 0 Nutritional Assessment / Counseling / Intervention []  - 0 Lower Extremity Assessment (monofilament, tuning fork, pulses) []  - 0 Peripheral Arterial Disease Assessment (using hand held doppler) ASSESSMENTS - Ostomy and/or Continence Assessment and Care []  - Incontinence Assessment and Management 0 []  - 0 Ostomy Care Assessment and Management (repouching, etc.) PROCESS - Coordination of Care X - Simple Patient / Family Education for ongoing care 1 15 []  - 0 Complex (extensive) Patient / Family Education for ongoing care []  - 0 Staff obtains Programmer, systems, Records, Test Results / Process Orders []  - 0 Staff telephones HHA, Nursing Homes / Clarify orders / etc []  - 0 Routine Transfer to another Facility (non-emergent condition) []  - 0 Routine Hospital Admission (non-emergent condition) []  - 0 New Admissions / Biomedical engineer / Ordering NPWT, Apligraf, etc. []  - 0 Emergency Hospital Admission (emergent condition) X- 1 10 Simple Discharge Coordination Caroline [name], HINGLE. ([id number]) []  - 0 Complex (extensive) Discharge Coordination PROCESS - Special Needs []  - Pediatric / Minor Patient Management 0 []  - 0 Isolation Patient Management []  - 0 Hearing / Language / Visual special needs []  - 0 Assessment of Community assistance (transportation, D/C planning, etc.) []  - 0 Additional  assistance / Altered mentation []  - 0 Support Surface(s) Assessment (bed, cushion, seat, etc.) INTERVENTIONS - Wound Cleansing / Measurement X - Simple Wound Cleansing -  one wound 1 5 []  - 0 Complex Wound Cleansing - multiple wounds []  - 0 Wound Imaging (photographs - any number of wounds) []  - 0 Wound Tracing (instead of photographs) X- 1 5 Simple Wound Measurement - one wound []  - 0 Complex Wound Measurement - multiple wounds INTERVENTIONS - Wound Dressings []  - Small Wound Dressing one or multiple wounds 0 X- 1 15 Medium Wound Dressing one or multiple wounds []  - 0 Large Wound Dressing one or multiple wounds []  - 0 Application of Medications - topical []  - 0 Application of Medications - injection INTERVENTIONS - Miscellaneous []  - External ear exam 0 []  - 0 Specimen Collection (cultures, biopsies, blood, body fluids, etc.) []  - 0 Specimen(s) / Culture(s) sent or taken to Lab for analysis []  - 0 Patient Transfer (multiple staff / Civil Service fast streamer / Similar devices) []  - 0 Simple Staple / Suture removal (25 or less) []  - 0 Complex Staple / Suture removal (26 or more) []  - 0 Hypo / Hyperglycemic Management (close monitor of Blood Glucose) []  - 0 Ankle / Brachial Index (ABI) - do not check if billed separately []  - 0 Vital Signs Caroline [name], [name] ([id number]) Has the patient been seen at the hospital within the last three years: Yes Total Score: 55 Level Of Care: New/Established - Level 2 Electronic Signature(s) Unsigned Entered By: Gretta Cool, BSN, RN, CWS, Kim on 07/11/2019 08:34:08 Signature(s): Date(s): Caroline [name] ([id number]) -------------------------------------------------------------------------------- Encounter Discharge Information Details Patient Name: Caroline [name] Date of Service: 07/11/2019 8:00 AM Medical Record Number: [id number] Patient Account Number: [id number] Date of Birth/Sex: 09-23-1941 (77 y.o. F) Treating RN: Cornell Barman Primary Care  Renata Gambino: Threasa Alpha Other Clinician: Referring Alexia Dinger: Threasa Alpha Treating Jurell Basista/Extender: Melburn Hake, HOYT Weeks in Treatment: 0 Encounter Discharge Information Items Discharge Condition: Stable Ambulatory Status: Wheelchair Discharge Destination: Home Transportation: Private Auto Accompanied By: husband Schedule Follow-up Appointment: Yes Clinical Summary of Care: Electronic Signature(s) Signed: 07/11/2019 8:33:33 AM By: Gretta Cool, BSN, RN, CWS, Kim RN, BSN Entered By: Gretta Cool, BSN, RN, CWS, Kim on 07/11/2019 08:33:33 Caroline [name] ([id number]) -------------------------------------------------------------------------------- Wound Assessment Details Patient Name: Caroline [name] Date of Service: 07/11/2019 8:00 AM Medical Record Number: [id number] Patient Account Number: [id number] Date of Birth/Sex: 1941-09-17 (77 y.o. F) Treating RN: Cornell Barman Primary Care Klynn Linnemann: Threasa Alpha Other Clinician: Referring Jada Kuhnert: Threasa Alpha Treating Marisol Giambra/Extender: STONE III, HOYT Weeks in Treatment: 0 Wound Status Wound Number: 1 Primary Venous Leg Ulcer Etiology: Wound Location: Right Lower Leg - Anterior Wound Open Wounding Event: Trauma Status: Date Acquired: 06/24/2019 Comorbid Congestive Heart Failure, Coronary Artery Weeks Of Treatment: 0 History: Disease, Hypertension, Gout, Osteoarthritis Clustered Wound: No Wound Measurements Length: (cm) 2 Width: (cm) 1 Depth: (cm) 0.1 Area: (cm) 1.571 Volume: (cm) 0.157 % Reduction in Area: 64.9% % Reduction in Volume: 65% Epithelialization: None Wound Description Full Thickness Without Exposed Support Classification: Structures Wound Margin: Flat and Intact Exudate Large Amount: Exudate Type: Serous Exudate Color: amber Foul Odor After Cleansing: No Slough/Fibrino Yes Wound Bed Granulation Amount: Small (1-33%) Exposed Structure Granulation Quality: Pink Fascia Exposed: No Necrotic Amount:  Large (67-100%) Fat Layer (Subcutaneous Tissue) Exposed: Yes Necrotic Quality: Adherent Slough Tendon Exposed: No Muscle Exposed: No Joint Exposed: No Bone Exposed: No Treatment Notes Wound #1 (Right, Anterior Lower Leg) Notes xeroform, ABD, kerlix, coban bilateral lower legs Electronic Signature(s) Signed: 07/11/2019 8:32:29 AM By: Gretta Cool, BSN, RN, CWS, Kim RN, BSN Entered By: Gretta Cool, BSN, RN, CWS, Kim on 07/11/2019 08:32:29

## 2019-07-15 ENCOUNTER — Encounter: Payer: Medicare Other | Admitting: Physician Assistant

## 2019-07-15 ENCOUNTER — Other Ambulatory Visit: Payer: Self-pay

## 2019-07-15 DIAGNOSIS — L97812 Non-pressure chronic ulcer of other part of right lower leg with fat layer exposed: Secondary | ICD-10-CM | POA: Diagnosis not present

## 2019-07-17 NOTE — Telephone Encounter (Signed)
Left voicemail message to call back  

## 2019-07-17 NOTE — Telephone Encounter (Signed)
Spoke with daughter and she reports that she fell down some steps in the garage. She had wound on her leg above ankle. She is going to wound clinic for that and it is looking better. Reports of bruising, palpitations, and just concerned about her bilateral legs were swollen. She states that they did wrap the legs at the wound clinic and that did help some. She wanted to inquire if she could take a extra fluid pill to see if that would help. Advised that she could try tomorrow with extra pill after lunch to see if that helps but the most important things are to keep legs wrapped, compression hose, leg elevation, and fluid restriction with low sodium diet. She was upset that there was such a delay in a call back. Apologies given and expressed that I was sorry for this delay and I would make our manager aware. Scheduled patient to come in on Tuesday to see Dr. [name] and will send this message to him as well. She was appreciative for the call and had no further questions at this time.

## 2019-07-18 ENCOUNTER — Other Ambulatory Visit: Payer: Self-pay

## 2019-07-18 ENCOUNTER — Encounter: Payer: Medicare Other | Admitting: Physician Assistant

## 2019-07-18 DIAGNOSIS — L97812 Non-pressure chronic ulcer of other part of right lower leg with fat layer exposed: Secondary | ICD-10-CM | POA: Diagnosis not present

## 2019-07-18 NOTE — Progress Notes (Signed)
Caroline [name], Caroline [name] ([id number]) Visit Report for 07/18/2019 Arrival Information Details Patient Name: Caroline [name], Caroline [name] Date of Service: 07/18/2019 2:00 PM Medical Record Number: [id number] Patient Account Number: [id number] Date of Birth/Sex: Sep 11, 1941 (77 y.o. F) Treating RN: Harold Barban Primary Care Alahni Varone: Threasa Alpha Other Clinician: Referring Kateleen Encarnacion: Threasa Alpha Treating Kursten Kruk/Extender: Melburn Hake, HOYT Weeks in Treatment: 1 Visit Information History Since Last Visit Added or deleted any medications: No Patient Arrived: Wheel Chair Any new allergies or adverse reactions: No Arrival Time: 14:09 Had a fall or experienced change in No Accompanied By: family activities of daily living that may affect Transfer Assistance: None risk of falls: Patient Identification Verified: Yes Signs or symptoms of abuse/neglect since last visito No Secondary Verification Process Yes Hospitalized since last visit: No Completed: Has Dressing in Place as Prescribed: Yes Patient Has Alerts: Yes Has Compression in Place as Prescribed: Yes Patient Alerts: Patient on Blood Pain Present Now: No Thinner Eliquis Electronic Signature(s) Signed: 07/18/2019 3:44:22 PM By: Harold Barban Entered By: Harold Barban on 07/18/2019 14:10:43 Caroline [name] ([id number]) -------------------------------------------------------------------------------- Clinic Level of Care Assessment Details Patient Name: Caroline [name] Date of Service: 07/18/2019 2:00 PM Medical Record Number: [id number] Patient Account Number: [id number] Date of Birth/Sex: 03-Sep-1941 (77 y.o. F) Treating RN: Harold Barban Primary Care Stefanny Pieri: Threasa Alpha Other Clinician: Referring Shalon Councilman: Threasa Alpha Treating Tad Fancher/Extender: STONE III, HOYT Weeks in Treatment: 1 Clinic Level of Care Assessment Items TOOL 3 Quantity Score []  - Use when EandM and Procedure is performed on FOLLOW-UP visit 0 ASSESSMENTS -  Nursing Assessment / Reassessment X - Reassessment of Co-morbidities (includes updates in patient status) 1 10 X- 1 5 Reassessment of Adherence to Treatment Plan ASSESSMENTS - Wound and Skin Assessment / Reassessment []  - Points for Wound Assessment can only be taken for a new wound of unknown or different 0 etiology and a procedure is NOT performed to that wound X- 1 5 Simple Wound Assessment / Reassessment - one wound []  - 0 Complex Wound Assessment / Reassessment - multiple wounds []  - 0 Dermatologic / Skin Assessment (not related to wound area) ASSESSMENTS - Focused Assessment []  - Circumferential Edema Measurements - multi extremities 0 []  - 0 Nutritional Assessment / Counseling / Intervention []  - 0 Lower Extremity Assessment (monofilament, tuning fork, pulses) []  - 0 Peripheral Arterial Disease Assessment (using hand held doppler) ASSESSMENTS - Ostomy and/or Continence Assessment and Care []  - Incontinence Assessment and Management 0 []  - 0 Ostomy Care Assessment and Management (repouching, etc.) PROCESS - Coordination of Care []  - Points for Discharge Coordination can only be taken for a new wound of unknown or 0 different etiology and a procedure is NOT performed to that wound X- 1 15 Simple Patient / Family Education for ongoing care []  - 0 Complex (extensive) Patient / Family Education for ongoing care []  - 0 Staff obtains Programmer, systems, Records, Test Results / Process Orders []  - 0 Staff telephones HHA, Nursing Homes / Clarify orders / etc []  - 0 Routine Transfer to another Facility (non-emergent condition) []  - 0 Routine Hospital Admission (non-emergent condition) Caroline [name], Caroline [name] ([id number]) []  - 0 New Admissions / Biomedical engineer / Ordering NPWT, Apligraf, etc. []  - 0 Emergency Hospital Admission (emergent condition) X- 1 10 Simple Discharge Coordination []  - 0 Complex (extensive) Discharge Coordination PROCESS - Special Needs []  - Pediatric /  Minor Patient Management 0 []  - 0 Isolation Patient Management []  - 0 Hearing / Language / Visual special needs []  - 0  Assessment of Community assistance (transportation, D/C planning, etc.) []  - 0 Additional assistance / Altered mentation []  - 0 Support Surface(s) Assessment (bed, cushion, seat, etc.) INTERVENTIONS - Wound Cleansing / Measurement []  - Points for Wound Cleaning / Measurement, Wound Dressing, Specimen Collection and 0 Specimen taken to lab can only be taken for a new wound of unknown or different etiology and a procedure is NOT performed to that wound X- 1 5 Simple Wound Cleansing - one wound []  - 0 Complex Wound Cleansing - multiple wounds X- 1 5 Wound Imaging (photographs - any number of wounds) []  - 0 Wound Tracing (instead of photographs) X- 1 5 Simple Wound Measurement - one wound []  - 0 Complex Wound Measurement - multiple wounds INTERVENTIONS - Wound Dressings X - Small Wound Dressing one or multiple wounds 1 10 []  - 0 Medium Wound Dressing one or multiple wounds []  - 0 Large Wound Dressing one or multiple wounds INTERVENTIONS - Miscellaneous []  - External ear exam 0 []  - 0 Specimen Collection (cultures, biopsies, blood, body fluids, etc.) []  - 0 Specimen(s) / Culture(s) sent or taken to Lab for analysis []  - 0 Patient Transfer (multiple staff / Civil Service fast streamer / Similar devices) []  - 0 Simple Staple / Suture removal (25 or less) []  - 0 Complex Staple / Suture removal (26 or more) Caroline [name], [name] ([id number]) []  - 0 Hypo / Hyperglycemic Management (close monitor of Blood Glucose) []  - 0 Ankle / Brachial Index (ABI) - do not check if billed separately []  - 0 Vital Signs Has the patient been seen at the hospital within the last three years: Yes Total Score: 70 Level Of Care: New/Established - Level 2 Electronic Signature(s) Signed: 07/18/2019 3:44:22 PM By: Harold Barban Entered By: Harold Barban on 07/18/2019 14:37:47 Caroline [name]  ([id number]) -------------------------------------------------------------------------------- Compression Therapy Details Patient Name: Caroline [name] Date of Service: 07/18/2019 2:00 PM Medical Record Number: [id number] Patient Account Number: [id number] Date of Birth/Sex: 10/15/1941 (77 y.o. F) Treating RN: Harold Barban Primary Care Vetra Shinall: Threasa Alpha Other Clinician: Referring Cristen Bredeson: Threasa Alpha Treating Schelly Chuba/Extender: STONE III, HOYT Weeks in Treatment: 1 Compression Therapy Performed for Wound Assessment: Wound #1 Right,Anterior Lower Leg Performed By: Clinician Harold Barban, RN Compression Type: Double Layer Electronic Signature(s) Signed: 07/18/2019 3:44:22 PM By: Harold Barban Entered By: Harold Barban on 07/18/2019 14:29:00 Caroline [name] ([id number]) -------------------------------------------------------------------------------- Encounter Discharge Information Details Patient Name: Caroline [name] Date of Service: 07/18/2019 2:00 PM Medical Record Number: [id number] Patient Account Number: [id number] Date of Birth/Sex: [birth year] (77 y.o. F) Treating RN: Harold Barban Primary Care Whitleigh Garramone: Threasa Alpha Other Clinician: Referring Rumaisa Schnetzer: Threasa Alpha Treating Bertin Inabinet/Extender: Melburn Hake, HOYT Weeks in Treatment: 1 Encounter Discharge Information Items Discharge Condition: Stable Ambulatory Status: Wheelchair Discharge Destination: Home Transportation: Private Auto Accompanied By: family Schedule Follow-up Appointment: Yes Clinical Summary of Care: Electronic Signature(s) Signed: 07/18/2019 3:44:22 PM By: Harold Barban Entered By: Harold Barban on 07/18/2019 14:36:49 Caroline [name] ([id number]) -------------------------------------------------------------------------------- Wound Assessment Details Patient Name: Caroline [name] Date of Service: 07/18/2019 2:00 PM Medical Record Number: [id number] Patient Account  Number: [id number] Date of Birth/Sex: 06-17-1941 (77 y.o. F) Treating RN: Harold Barban Primary Care Ladashia Demarinis: Threasa Alpha Other Clinician: Referring Janice Bodine: Threasa Alpha Treating Alta Goding/Extender: STONE III, HOYT Weeks in Treatment: 1 Wound Status Wound Number: 1 Primary Venous Leg Ulcer Etiology: Wound Location: Right Lower Leg - Anterior Wound Open Wounding Event: Trauma Status: Date Acquired: 06/24/2019 Comorbid Congestive Heart Failure, Coronary Artery Weeks Of Treatment: 1 History:  Disease, Hypertension, Gout, Osteoarthritis Clustered Wound: No Photos Wound Measurements Length: (cm) 2 Width: (cm) 1.2 Depth: (cm) 0.1 Area: (cm) 1.885 Volume: (cm) 0.188 % Reduction in Area: 57.9% % Reduction in Volume: 58% Epithelialization: None Tunneling: No Undermining: No Wound Description Full Thickness Without Exposed Support Classification: Structures Wound Margin: Flat and Intact Exudate Medium Amount: Exudate Type: Serous Exudate Color: amber Foul Odor After Cleansing: No Slough/Fibrino Yes Wound Bed Granulation Amount: Small (1-33%) Exposed Structure Granulation Quality: Pink Fascia Exposed: No Necrotic Amount: Large (67-100%) Fat Layer (Subcutaneous Tissue) Exposed: Yes Necrotic Quality: Adherent Slough Tendon Exposed: No Muscle Exposed: No Joint Exposed: No Bone Exposed: No Caroline [name], [name] ([id number]) Treatment Notes Wound #1 (Right, Anterior Lower Leg) Notes xeroform, ABD, kerlix, coban bilateral lower legs Electronic Signature(s) Signed: 07/18/2019 3:44:22 PM By: Harold Barban Entered By: Harold Barban on 07/18/2019 14:28:14

## 2019-07-18 NOTE — Telephone Encounter (Signed)
Patient is scheduled to come in next week to see provider and review concerns.

## 2019-07-18 NOTE — Telephone Encounter (Signed)
Fine to try extra Lasix She will need lab work when she comes for appointment

## 2019-07-20 NOTE — Progress Notes (Signed)
ANELISSE, Caroline [name] ([id number]) Visit Report for 07/15/2019 Arrival Information Details Patient Name: Caroline [name], Caroline [name] Date of Service: 07/15/2019 3:00 PM Medical Record Number: [id number] Patient Account Number: [id number] Date of Birth/Sex: 06-13-1941 (78 y.o. F) Treating RN: Montey Hora Primary Care Donnis Pecha: Threasa Alpha Other Clinician: Referring Catalaya Garr: Threasa Alpha Treating Darcel Zick/Extender: Melburn Hake, HOYT Weeks in Treatment: 1 Visit Information History Since Last Visit Added or deleted any medications: No Patient Arrived: Ambulatory Any new allergies or adverse reactions: No Arrival Time: 15:04 Had a fall or experienced change in No Accompanied By: spouse activities of daily living that may affect Transfer Assistance: None risk of falls: Patient Identification Verified: Yes Signs or symptoms of abuse/neglect since last visito No Secondary Verification Process Yes Hospitalized since last visit: No Completed: Implantable device outside of the clinic excluding No Patient Has Alerts: Yes cellular tissue based products placed in the center Patient Alerts: Patient on Blood since last visit: Thinner Has Dressing in Place as Prescribed: Yes Eliquis Has Compression in Place as Prescribed: Yes Pain Present Now: No Electronic Signature(s) Signed: 07/15/2019 4:17:58 PM By: Montey Hora Entered By: Montey Hora on 07/15/2019 15:04:28 Caroline [name] ([id number]) -------------------------------------------------------------------------------- Clinic Level of Care Assessment Details Patient Name: Caroline [name] Date of Service: 07/15/2019 3:00 PM Medical Record Number: [id number] Patient Account Number: [id number] Date of Birth/Sex: [birth year] (78 y.o. F) Treating RN: Cornell Barman Primary Care Breckyn Ticas: Threasa Alpha Other Clinician: Referring Dorris Vangorder: Threasa Alpha Treating Perina Salvaggio/Extender: Melburn Hake, HOYT Weeks in Treatment: 1 Clinic Level of Care  Assessment Items TOOL 4 Quantity Score []  - Use when only an EandM is performed on FOLLOW-UP visit 0 ASSESSMENTS - Nursing Assessment / Reassessment X - Reassessment of Co-morbidities (includes updates in patient status) 1 10 X- 1 5 Reassessment of Adherence to Treatment Plan ASSESSMENTS - Wound and Skin Assessment / Reassessment X - Simple Wound Assessment / Reassessment - one wound 1 5 []  - 0 Complex Wound Assessment / Reassessment - multiple wounds []  - 0 Dermatologic / Skin Assessment (not related to wound area) ASSESSMENTS - Focused Assessment []  - Circumferential Edema Measurements - multi extremities 0 []  - 0 Nutritional Assessment / Counseling / Intervention []  - 0 Lower Extremity Assessment (monofilament, tuning fork, pulses) []  - 0 Peripheral Arterial Disease Assessment (using hand held doppler) ASSESSMENTS - Ostomy and/or Continence Assessment and Care []  - Incontinence Assessment and Management 0 []  - 0 Ostomy Care Assessment and Management (repouching, etc.) PROCESS - Coordination of Care X - Simple Patient / Family Education for ongoing care 1 15 []  - 0 Complex (extensive) Patient / Family Education for ongoing care X- 1 10 Staff obtains Programmer, systems, Records, Test Results / Process Orders []  - 0 Staff telephones HHA, Nursing Homes / Clarify orders / etc []  - 0 Routine Transfer to another Facility (non-emergent condition) []  - 0 Routine Hospital Admission (non-emergent condition) []  - 0 New Admissions / Biomedical engineer / Ordering NPWT, Apligraf, etc. []  - 0 Emergency Hospital Admission (emergent condition) X- 1 10 Simple Discharge Coordination OSCEOLA, HOLIAN. ([id number]) []  - 0 Complex (extensive) Discharge Coordination PROCESS - Special Needs []  - Pediatric / Minor Patient Management 0 []  - 0 Isolation Patient Management []  - 0 Hearing / Language / Visual special needs []  - 0 Assessment of Community assistance (transportation, D/C planning,  etc.) []  - 0 Additional assistance / Altered mentation []  - 0 Support Surface(s) Assessment (bed, cushion, seat, etc.) INTERVENTIONS - Wound Cleansing / Measurement X - Simple Wound Cleansing -  one wound 1 5 []  - 0 Complex Wound Cleansing - multiple wounds X- 1 5 Wound Imaging (photographs - any number of wounds) []  - 0 Wound Tracing (instead of photographs) X- 1 5 Simple Wound Measurement - one wound []  - 0 Complex Wound Measurement - multiple wounds INTERVENTIONS - Wound Dressings []  - Small Wound Dressing one or multiple wounds 0 X- 1 15 Medium Wound Dressing one or multiple wounds []  - 0 Large Wound Dressing one or multiple wounds []  - 0 Application of Medications - topical []  - 0 Application of Medications - injection INTERVENTIONS - Miscellaneous []  - External ear exam 0 []  - 0 Specimen Collection (cultures, biopsies, blood, body fluids, etc.) []  - 0 Specimen(s) / Culture(s) sent or taken to Lab for analysis []  - 0 Patient Transfer (multiple staff / Civil Service fast streamer / Similar devices) []  - 0 Simple Staple / Suture removal (25 or less) []  - 0 Complex Staple / Suture removal (26 or more) []  - 0 Hypo / Hyperglycemic Management (close monitor of Blood Glucose) []  - 0 Ankle / Brachial Index (ABI) - do not check if billed separately X- 1 5 Vital Signs Cimo, Buffy K. ([id number]) Has the patient been seen at the hospital within the last three years: Yes Total Score: 90 Level Of Care: New/Established - Level 3 Electronic Signature(s) Signed: 07/16/2019 9:33:28 AM By: Gretta Cool, BSN, RN, CWS, Kim RN, BSN Entered By: Gretta Cool, BSN, RN, CWS, Kim on 07/15/2019 15:25:16 Caroline [name] ([id number]) -------------------------------------------------------------------------------- Encounter Discharge Information Details Patient Name: Caroline [name] Date of Service: 07/15/2019 3:00 PM Medical Record Number: [id number] Patient Account Number: [id number] Date of Birth/Sex:  October 23, 1941 (78 y.o. F) Treating RN: Army Melia Primary Care Brockton Mckesson: Threasa Alpha Other Clinician: Referring Courtny Bennison: Threasa Alpha Treating Lavaeh Bau/Extender: Melburn Hake, HOYT Weeks in Treatment: 1 Encounter Discharge Information Items Discharge Condition: Stable Ambulatory Status: Walker Discharge Destination: Home Transportation: Private Auto Accompanied By: husband Schedule Follow-up Appointment: Yes Clinical Summary of Care: Electronic Signature(s) Signed: 07/15/2019 3:55:24 PM By: Army Melia Entered By: Army Melia on 07/15/2019 15:40:37 Caroline [name] ([id number]) -------------------------------------------------------------------------------- Lower Extremity Assessment Details Patient Name: Caroline [name] Date of Service: 07/15/2019 3:00 PM Medical Record Number: [id number] Patient Account Number: [id number] Date of Birth/Sex: [birth year] (78 y.o. F) Treating RN: Montey Hora Primary Care Monserat Prestigiacomo: Threasa Alpha Other Clinician: Referring Issam Carlyon: Threasa Alpha Treating Jerrell Mangel/Extender: STONE III, HOYT Weeks in Treatment: 1 Edema Assessment Assessed: [Left: No] [Right: No] Edema: [Left: Yes] [Right: Yes] Calf Left: Right: Point of Measurement: 29 cm From Medial Instep 36 cm 33 cm Ankle Left: Right: Point of Measurement: 11 cm From Medial Instep 21.3 cm 20.5 cm Vascular Assessment Pulses: Dorsalis Pedis Palpable: [Left:Yes] [Right:Yes] Electronic Signature(s) Signed: 07/15/2019 4:17:58 PM By: Montey Hora Entered By: Montey Hora on 07/15/2019 15:07:00 Caroline [name] ([id number]) -------------------------------------------------------------------------------- Multi Wound Chart Details Patient Name: Caroline [name] Date of Service: 07/15/2019 3:00 PM Medical Record Number: [id number] Patient Account Number: [id number] Date of Birth/Sex: [birth year] (78 y.o. F) Treating RN: Cornell Barman Primary Care Quaniyah Bugh: Threasa Alpha Other  Clinician: Referring Michiah Masse: Threasa Alpha Treating Meda Dudzinski/Extender: STONE III, HOYT Weeks in Treatment: 1 Vital Signs Height(in): 63 Pulse(bpm): 61 Weight(lbs): 155 Blood Pressure(mmHg): 134/78 Body Mass Index(BMI): 27 Temperature(F): 97.8 Respiratory Rate 16 (breaths/min): Photos: [N/A:N/A] Wound Location: Right Lower Leg - Anterior N/A N/A Wounding Event: Trauma N/A N/A Primary Etiology: Venous Leg Ulcer N/A N/A Comorbid History: Congestive Heart Failure, N/A N/A Coronary Artery Disease, Hypertension, Gout, Osteoarthritis Date Acquired: 06/24/2019  N/A N/A Weeks of Treatment: 1 N/A N/A Wound Status: Open N/A N/A Measurements L x W x D 2x1.2x0.1 N/A N/A (cm) Area (cm) : 1.885 N/A N/A Volume (cm) : 0.188 N/A N/A % Reduction in Area: 57.90% N/A N/A % Reduction in Volume: 58.00% N/A N/A Classification: Full Thickness Without N/A N/A Exposed Support Structures Exudate Amount: Medium N/A N/A Exudate Type: Serous N/A N/A Exudate Color: amber N/A N/A Wound Margin: Flat and Intact N/A N/A Granulation Amount: Small (1-33%) N/A N/A Granulation Quality: Pink N/A N/A Necrotic Amount: Large (67-100%) N/A N/A Exposed Structures: Fat Layer (Subcutaneous N/A N/A Tissue) Exposed: Yes Fascia: No Tendon: No BRIANNON, BOGGIO ([id number]) Muscle: No Joint: No Bone: No Epithelialization: None N/A N/A Treatment Notes Electronic Signature(s) Signed: 07/16/2019 9:33:28 AM By: Gretta Cool, BSN, RN, CWS, Kim RN, BSN Entered By: Gretta Cool, BSN, RN, CWS, Kim on 07/15/2019 15:23:16 Caroline [name] ([id number]) -------------------------------------------------------------------------------- Multi-Disciplinary Care Plan Details Patient Name: Caroline [name] Date of Service: 07/15/2019 3:00 PM Medical Record Number: [id number] Patient Account Number: [id number] Date of Birth/Sex: [birth year] (78 y.o. F) Treating RN: Cornell Barman Primary Care Nickolas Chalfin: Threasa Alpha Other  Clinician: Referring Jayne Peckenpaugh: Threasa Alpha Treating Clydean Posas/Extender: Melburn Hake, HOYT Weeks in Treatment: 1 Active Inactive Venous Leg Ulcer Nursing Diagnoses: Knowledge deficit related to disease process and management Goals: Patient will maintain optimal edema control Date Initiated: 07/08/2019 Target Resolution Date: 08/08/2019 Goal Status: Active Interventions: Assess peripheral edema status every visit. Treatment Activities: Non-invasive vascular studies : 07/08/2019 Notes: Wound/Skin Impairment Nursing Diagnoses: Impaired tissue integrity Goals: Ulcer/skin breakdown will have a volume reduction of 30% by week 4 Date Initiated: 07/08/2019 Target Resolution Date: 08/08/2019 Goal Status: Active Interventions: Assess patient/caregiver ability to obtain necessary supplies Assess patient/caregiver ability to perform ulcer/skin care regimen upon admission and as needed Assess ulceration(s) every visit Notes: Electronic Signature(s) Signed: 07/16/2019 9:33:28 AM By: Gretta Cool, BSN, RN, CWS, Kim RN, BSN Entered By: Gretta Cool, BSN, RN, CWS, Kim on 07/15/2019 15:23:08 Caroline [name] ([id number]) -------------------------------------------------------------------------------- Pain Assessment Details Patient Name: Caroline [name] Date of Service: 07/15/2019 3:00 PM Medical Record Number: [id number] Patient Account Number: [id number] Date of Birth/Sex: 13-Jun-1941 (78 y.o. F) Treating RN: Montey Hora Primary Care Eeshan Verbrugge: Threasa Alpha Other Clinician: Referring Lewanda Perea: Threasa Alpha Treating Ailyn Gladd/Extender: STONE III, HOYT Weeks in Treatment: 1 Active Problems Location of Pain Severity and Description of Pain Patient Has Paino No Site Locations Pain Management and Medication Current Pain Management: Electronic Signature(s) Signed: 07/15/2019 4:17:58 PM By: Montey Hora Entered By: Montey Hora on 07/15/2019 15:04:34 Caroline [name]  ([id number]) -------------------------------------------------------------------------------- Patient/Caregiver Education Details Patient Name: Caroline [name] Date of Service: 07/15/2019 3:00 PM Medical Record Number: [id number] Patient Account Number: [id number] Date of Birth/Gender: 03-Feb-1941 (78 y.o. F) Treating RN: Cornell Barman Primary Care Physician: Threasa Alpha Other Clinician: Referring Physician: Threasa Alpha Treating Physician/Extender: Sharalyn Ink in Treatment: 1 Education Assessment Education Provided To: Patient Education Topics Provided Wound/Skin Impairment: Handouts: Caring for Your Ulcer Methods: Demonstration, Explain/Verbal Responses: State content correctly Electronic Signature(s) Signed: 07/16/2019 9:33:28 AM By: Gretta Cool, BSN, RN, CWS, Kim RN, BSN Entered By: Gretta Cool, BSN, RN, CWS, Kim on 07/15/2019 15:25:32 Caroline [name] ([id number]) -------------------------------------------------------------------------------- Wound Assessment Details Patient Name: Caroline [name] Date of Service: 07/15/2019 3:00 PM Medical Record Number: [id number] Patient Account Number: [id number] Date of Birth/Sex: [birth year] (78 y.o. F) Treating RN: Montey Hora Primary Care Chyla Schlender: Threasa Alpha Other Clinician: Referring Shawnee Gambone: Threasa Alpha Treating Abra Lingenfelter/Extender: STONE III, HOYT Weeks in Treatment: 1 Wound  Status Wound Number: 1 Primary Venous Leg Ulcer Etiology: Wound Location: Right Lower Leg - Anterior Wound Open Wounding Event: Trauma Status: Date Acquired: 06/24/2019 Comorbid Congestive Heart Failure, Coronary Artery Weeks Of Treatment: 1 History: Disease, Hypertension, Gout, Osteoarthritis Clustered Wound: No Photos Wound Measurements Length: (cm) 2 Width: (cm) 1.2 Depth: (cm) 0.1 Area: (cm) 1.885 Volume: (cm) 0.188 % Reduction in Area: 57.9% % Reduction in Volume: 58% Epithelialization: None Tunneling: No Undermining:  No Wound Description Full Thickness Without Exposed Support Classification: Structures Wound Margin: Flat and Intact Exudate Medium Amount: Exudate Type: Serous Exudate Color: amber Foul Odor After Cleansing: No Slough/Fibrino Yes Wound Bed Granulation Amount: Small (1-33%) Exposed Structure Granulation Quality: Pink Fascia Exposed: No Necrotic Amount: Large (67-100%) Fat Layer (Subcutaneous Tissue) Exposed: Yes Necrotic Quality: Adherent Slough Tendon Exposed: No Muscle Exposed: No Joint Exposed: No Bone Exposed: No MICHAELENE, DUTAN ([id number]) Electronic Signature(s) Signed: 07/15/2019 3:08:08 PM By: Army Melia Signed: 07/15/2019 4:17:58 PM By: Montey Hora Entered By: Army Melia on 07/15/2019 15:08:08 Caroline [name] ([id number]) -------------------------------------------------------------------------------- Vitals Details Patient Name: Caroline [name] Date of Service: 07/15/2019 3:00 PM Medical Record Number: [id number] Patient Account Number: [id number] Date of Birth/Sex: Nov 02, 1941 (78 y.o. F) Treating RN: Montey Hora Primary Care Cheris Tweten: Threasa Alpha Other Clinician: Referring Nakia Koble: Threasa Alpha Treating Akia Desroches/Extender: STONE III, HOYT Weeks in Treatment: 1 Vital Signs Time Taken: 15:04 Temperature (F): 97.8 Height (in): 63 Pulse (bpm): 61 Weight (lbs): 155 Respiratory Rate (breaths/min): 16 Body Mass Index (BMI): 27.5 Blood Pressure (mmHg): 134/78 Reference Range: 80 - 120 mg / dl Electronic Signature(s) Signed: 07/15/2019 4:17:58 PM By: Montey Hora Entered By: Montey Hora on 07/15/2019 15:04:54

## 2019-07-22 ENCOUNTER — Other Ambulatory Visit: Payer: Self-pay

## 2019-07-22 ENCOUNTER — Other Ambulatory Visit (INDEPENDENT_AMBULATORY_CARE_PROVIDER_SITE_OTHER): Payer: Self-pay | Admitting: Internal Medicine

## 2019-07-22 ENCOUNTER — Encounter: Payer: Medicare Other | Admitting: Physician Assistant

## 2019-07-22 DIAGNOSIS — I872 Venous insufficiency (chronic) (peripheral): Secondary | ICD-10-CM

## 2019-07-22 DIAGNOSIS — R6889 Other general symptoms and signs: Secondary | ICD-10-CM

## 2019-07-22 DIAGNOSIS — S81801S Unspecified open wound, right lower leg, sequela: Secondary | ICD-10-CM

## 2019-07-22 DIAGNOSIS — L97909 Non-pressure chronic ulcer of unspecified part of unspecified lower leg with unspecified severity: Secondary | ICD-10-CM

## 2019-07-22 DIAGNOSIS — L97812 Non-pressure chronic ulcer of other part of right lower leg with fat layer exposed: Secondary | ICD-10-CM | POA: Diagnosis not present

## 2019-07-22 NOTE — Progress Notes (Signed)
Cardiology Office Note  Date:  07/23/2019   ID:  Caroline [name], Caroline [name] 06/01/1941, MRN [id number]  PCP:  Remi Haggard, FNP   Chief Complaint  Patient presents with  . Other    Patient c/o swelling and bruising from a recent fall. Meds reviewed verbally with patient.     HPI:  Caroline [name] is a 78 year old woman with history of  Paroxysmal atrial fibrillation Pulmonary hypertension pressures of 50-60 on echo hypertension,  hyperlipidemia,  moderate Bilateral carotid disease   minimal coronary artery disease on catheterization in September 2005  Chronic renal insufficiency, creatinine of 2 Mild to moderate mitral valve stenosis CVA x2, on eliquis Who presents for f/u of her paroxysmal atrial fibrillation, diastolic CHF, pulm HTN and SOB  continues to take Eliquis for underlying A. fib. And CVA Fall at home approximately 07/02/2019 suffered a large left groin injury Since then has had tremendous thigh ecchymotic swelling from bruising extending down her leg Very tender, only in the past week or so feeling little bit better Walking with a walker  Went to the wound clinic for 2 sores on her lower extremities, has wraps on both legs  Had ankle-brachial indexes performed today both showing moderate lower extremity arterial disease  In the office today Reds vest 31% for history of CHF, lower extremity edema Reports she is compliant with her Lasix daily No recent lab work available Denies having significant shortness of breath Last clinic visit January 2020 had more short of breath on exertion  No chest pain   oxygen at nighttime when she sleeps,  prescribed PRN  by PCP in the setting of ambulatory hypoxia dipping down into the low to mid 80s on room air.   Previously seen by pulmonary, had 6-minute walk test at that time   followed by [facility] nephrology Has required escalating amounts of Lasix for shortness of breath Creatinine  1.8  Echo 01/24/2019, results reviewed with her   1. The left ventricle has normal systolic function of 12-75%. The cavity size is mildly increased. There is mild left ventricular wall thickness. Echo evidence of pseudonormal diastolic filling patterns.  2. The right ventricle is in size. There is normal systolic function. Right ventricular systolic pressure is severely elevated with an estimated pressure of 60.0 mmHg.  3. Moderately dilated left atrial size.  . The inferior vena cava was normal in size <50% respiratory variablity.  Previously asymptomatic when in atrial fibrillation based on previous EKGs and event monitors   EKG personally reviewed by myself on todays visit Shows normal sinus rhythm with rate 82 bpm no significant ST or T-wave changes  Other past medical history reviewed hospital December 2018 acute respiratory failure  after left knee total replacement  Readmitted with acute respiratory failure concerning for pneumonia, diastolic CHF Treated with antibiotics, Had diuresis Paroxysmal atrial fibrillation maintained in sinus rhythm   30 day monitor episodes of paroxysmal atrial fibrillation Based on this we started her on Cardizem 120 mg daily , stopped amlodipine Clonidine held for bradycardia hypotension  old TIA on 09/24/2016,   took herself off statins in the past  TIA 04/06/2017, lasted 10 min presented to the ED with a chief complaint of slurred speech and right-sided weakness. Symptoms  Resolved.  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   Cardiac catheterization September 2005 showing 20-30% mid left circumflex disease, ejection fraction 60%, otherwise normal study   PMH:   has a past medical history of Arthritis, Chronic diastolic  CHF (congestive heart failure) ([place]), CKD (chronic kidney disease), stage IV ([place]), History of kidney stones (01/2017), Hypertension, Hypothyroidism, Non-obstructive CAD (coronary artery disease), PAF (paroxysmal atrial fibrillation) ([place]), and Stroke  ([place]).  PSH:    Past Surgical History:  Procedure Laterality Date  . ABDOMINAL HYSTERECTOMY    . CARDIAC CATHETERIZATION     Negative   . COLONOSCOPY  2010  . PARTIAL HYSTERECTOMY    . TOTAL KNEE ARTHROPLASTY Left 11/28/2017   Procedure: TOTAL KNEE ARTHROPLASTY;  Surgeon: Thornton Park, MD;  Location: [facility] ORS;  Service: Orthopedics;  Laterality: Left;    Current Outpatient Medications  Medication Sig Dispense Refill  . acetaminophen (TYLENOL) 500 MG tablet Take 1,000 mg by mouth daily as needed (for pain.).    Marland Kitchen allopurinol (ZYLOPRIM) 100 MG tablet Take 100 mg by mouth daily.     . cyanocobalamin (,VITAMIN B-12,) 1000 MCG/ML injection Inject 1,000 mcg into the muscle every 30 (thirty) days.    Marland Kitchen ELIQUIS 5 MG TABS tablet Take 1 tablet by mouth twice daily 60 tablet 5  . furosemide (LASIX) 40 MG tablet Take 1 tablet (40 mg total) by mouth daily. 90 tablet 3  . levothyroxine (SYNTHROID, LEVOTHROID) 112 MCG tablet Take 112 mcg by mouth daily before breakfast.    . lisinopril (PRINIVIL,ZESTRIL) 40 MG tablet Take 1 tablet (40 mg total) by mouth daily. 90 tablet 3  . metoprolol tartrate (LOPRESSOR) 50 MG tablet Take 1 tablet (50 mg total) by mouth 2 (two) times daily. 180 tablet 3  . simvastatin (ZOCOR) 20 MG tablet Take 20 mg by mouth daily.     No current facility-administered medications for this visit.    Facility-Administered Medications Ordered in Other Visits  Medication Dose Route Frequency Provider Last Rate Last Dose  . albuterol (PROVENTIL) (2.5 MG/3ML) 0.083% nebulizer solution 2.5 mg  2.5 mg Nebulization Once Laverle Hobby, MD         Allergies:   Patient has no known allergies.   Social History:  The patient  reports that she has never smoked. She has never used smokeless tobacco. She reports current alcohol use of about 1.0 standard drinks of alcohol per week. She reports that she does not use drugs.   Family History:   family history includes Heart attack  (age of onset: 45) in her father; Heart disease in her brother; Hyperlipidemia in her father; Hypertension in her father.    Review of Systems: Review of Systems  Constitutional: Negative.   Respiratory: Negative.   Cardiovascular: Negative.   Gastrointestinal: Negative.   Musculoskeletal: Negative.        Ecchymotic bruise left thigh down the leg with tenderness  Neurological: Negative.   Psychiatric/Behavioral: Negative.   All other systems reviewed and are negative.   PHYSICAL EXAM: VS:  BP (!) 130/94 (BP Location: Left Arm, Patient Position: Sitting, Cuff Size: Normal)   Pulse 82   Ht 5\' 3"  (1.6 m)   Wt 155 lb (70.3 kg)   BMI 27.46 kg/m  , BMI Body mass index is 27.46 kg/m. Constitutional:  oriented to person, place, and time. No distress.  Presenting in a wheelchair HENT:  Head: Grossly normal Eyes:  no discharge. No scleral icterus.  Neck: Unable to estimate JVD, no carotid bruits  Cardiovascular: Regular rate and rhythm, no murmurs appreciated Wraps in place below the knees bilaterally, ecchymotic bruising extending up left thigh Pulmonary/Chest: Clear to auscultation bilaterally, no wheezes or rails Abdominal: Soft.  no distension.  no tenderness.  Musculoskeletal: Normal range of motion Neurological:  normal muscle tone. Coordination normal. No atrophy Skin: Skin warm and dry Psychiatric: normal affect, pleasant   Recent Labs: 08/31/2018: ALT 17; BUN 46; Potassium 3.8; Sodium 142 12/20/2018: Creatinine, Ser 1.80    Lipid Panel Lab Results  Component Value Date   CHOL 281 (H) 06/07/2017   HDL 30 (L) 06/07/2017   LDLCALC UNABLE TO CALCULATE IF TRIGLYCERIDE OVER 400 mg/dL 06/07/2017   TRIG 448 (H) 06/07/2017      Wt Readings from Last 3 Encounters:  07/23/19 155 lb (70.3 kg)  01/28/19 162 lb 12 oz (73.8 kg)  01/01/19 156 lb 8 oz (71 kg)      ASSESSMENT AND PLAN:  Bilateral carotid artery stenosis - Moderate disease,  Goal LDL less than  70 Previously took herself off statin Back on simvastatin 20 Would recommend repeat lab work through primary care Suspect she will not be at goal and may need to add Zetia  Coronary artery disease involving native heart without angina pectoris, unspecified vessel or lesion type - Normal ejection fraction Currently with no symptoms of angina. No further workup at this time. Continue current medication regimen.  Hyperlipidemia, unspecified hyperlipidemia type - Notes indicating she is on simvastatin Will likely need to add Zetia 10 mg daily depending on primary care lab work  Essential hypertension - Plan: EKG 12-Lead, Cardiac event monitor Blood pressure is well controlled on today's visit. No changes made to the medications.  Cerebrovascular accident (CVA), unspecified mechanism ([place]) -  Likely secondary to atrial fibrillation, on anticoagulation Stressed importance of medication compliance She will stay on Eliquis  Hematoma, groin Following a fall and trauma to left thigh Slowly healing  Paroxysmal atrial fibrillation (HCC) Maintaining normal sinus rhythm On anticoagulation  Pulmonary hypertension Elevated pressures on last several echocardiograms She does have mitral valve stenosis,  REDS VEST of 31% She denies any shortness of breath Several things going on at this time, healing from groin hematoma, followed by the wound clinic, needs follow-up with Dr. Lucky Cowboy We will hold off on further testing at this time  Mitral valve stenosis Repeat echocardiogram in follow-up visit  Disposition:   F/U  6 months  Long discussion concerning hematoma, guide for when to hold her Eliquis such as following traumatic injury.  REDS VEST scoring performed given shortness of breath, heart failure, leg swelling  Total encounter time more than 40 minutes  Greater than 50% was spent in counseling and coordination of care with the patient   No orders of the defined types were placed in this  encounter.    Signed, Esmond Plants, M.D., Ph.D. 07/23/2019  [place], [place]

## 2019-07-22 NOTE — Progress Notes (Addendum)
Caroline [name] ([id number]) Visit Report for 07/22/2019 Chief Complaint Document Details Patient Name: Caroline [name], Caroline [name] Date of Service: 07/22/2019 10:45 AM Medical Record Number: [id number] Patient Account Number: [id number] Date of Birth/Sex: March 29, 1941 (77 y.o. F) Treating RN: Harold Barban Primary Care Provider: Threasa Alpha Other Clinician: Referring Provider: Threasa Alpha Treating Provider/Extender: Melburn Hake, Shail Urbas Weeks in Treatment: 2 Information Obtained from: Patient Chief Complaint Weeping leg edema with skin breakdown right leg Electronic Signature(s) Signed: 07/22/2019 11:14:01 AM By: Worthy Keeler PA-C Entered By: Worthy Keeler on 07/22/2019 11:14:00 Caroline [name] ([id number]) -------------------------------------------------------------------------------- HPI Details Patient Name: Caroline [name] Date of Service: 07/22/2019 10:45 AM Medical Record Number: [id number] Patient Account Number: [id number] Date of Birth/Sex: 1941/04/08 (77 y.o. F) Treating RN: Harold Barban Primary Care Provider: Threasa Alpha Other Clinician: Referring Provider: Threasa Alpha Treating Provider/Extender: STONE III, Ylianna Almanzar Weeks in Treatment: 2 History of Present Illness HPI Description: Pleasant 78 year old Caucasian female with history of leg edema, newly enrolled in the congestive heart failure clinic, pulmonary hypertension, chronic kidney disease stage IV, chronic A. fib, oral anticoagulation with Eliquis, presenting with increased leg edema and weeping from both legs, with recent skin tear on right lower extremity anterior leg caused by scratching according to patient. Patient also had a fall about 2 weeks ago and pulled her "groin muscle" after which she has had some difficulty walking and uses a walker. Patient actually presenting to the wound clinic for the right leg skin tear for which she was actually placed on Bactrim for 7 days that she is completing at this  time. Patient is accompanied by her son and both are concerned that there may be infection in the right leg given skin changes they have noticed. 07/15/2019 upon evaluation patient's wound which is actually on the right anterior lower leg appears to be doing better when compared to the prior weeks evaluation. Overall I am pleased that though the wound measurement has not improved to significantly the overall appearance is greatly improved and this appears to be healing quite nicely. The patient is likewise pleased with how things seem to be at this point. I do think that she might benefit from compression therapy. Otherwise I think that the Xeroform gauze dressing has been of benefit for her and I am hopeful that she could continue to benefit from this. 07/22/2019 on evaluation today patient actually appears to be doing okay with regard to her ulcers on the lower extremities. She has been tolerating the dressing changes without complication which is good news. With that being said currently we have been using Xeroform to the areas I think she may benefit from switching to a silver collagen dressing as this was more effective in helping to obtain a skin growth. No fevers, chills, nausea, vomiting, or diarrhea. Electronic Signature(s) Signed: 07/22/2019 5:31:49 PM By: Worthy Keeler PA-C Entered By: Worthy Keeler on 07/22/2019 17:31:49 Caroline [name] ([id number]) -------------------------------------------------------------------------------- Physical Exam Details Patient Name: Caroline [name] Date of Service: 07/22/2019 10:45 AM Medical Record Number: [id number] Patient Account Number: [id number] Date of Birth/Sex: Apr 17, 1941 (77 y.o. F) Treating RN: Harold Barban Primary Care Provider: Threasa Alpha Other Clinician: Referring Provider: Threasa Alpha Treating Provider/Extender: STONE III, Enrico Eaddy Weeks in Treatment: 2 Constitutional Well-nourished and well-hydrated in no acute  distress. Respiratory normal breathing without difficulty. clear to auscultation bilaterally. Cardiovascular regular rate and rhythm with normal S1, S2. Psychiatric this patient is able to make decisions and demonstrates good insight into disease process. Alert and  Oriented x 3. pleasant and cooperative. Notes Upon inspection today patient's wound bed actually showed signs of excellent granulation at this time. There does not appear to be any signs of infection no sharp debridement was necessary mechanical debridement was performed which she did not have any difficulties with tolerating. Unfortunately the Curlex and Coban wraps did not seem to want to stay up again we cannot wrap with anything stronger from a compression standpoint simply due to the fact that we do not know her arterial status as of yet she actually goes for testing tomorrow. Electronic Signature(s) Signed: 07/22/2019 5:32:32 PM By: Worthy Keeler PA-C Entered By: Worthy Keeler on 07/22/2019 17:32:31 Caroline [name] ([id number]) -------------------------------------------------------------------------------- Physician Orders Details Patient Name: Caroline [name] Date of Service: 07/22/2019 10:45 AM Medical Record Number: [id number] Patient Account Number: [id number] Date of Birth/Sex: 1941-11-28 (77 y.o. F) Treating RN: Harold Barban Primary Care Provider: Threasa Alpha Other Clinician: Referring Provider: Threasa Alpha Treating Provider/Extender: Melburn Hake, Vaden Becherer Weeks in Treatment: 2 Verbal / Phone Orders: No Diagnosis Coding ICD-10 Coding Code Description I87.2 Venous insufficiency (chronic) (peripheral) I89.0 Lymphedema, not elsewhere classified L97.812 Non-pressure chronic ulcer of other part of right lower leg with fat layer exposed I10 Essential (primary) hypertension I48.20 Chronic atrial fibrillation, unspecified N18.4 Chronic kidney disease, stage 4 (severe) Z79.01 Long term (current) use of  anticoagulants Wound Cleansing Wound #1 Right,Anterior Lower Leg o Dial antibacterial soap, wash wounds, rinse and pat dry prior to dressing wounds Primary Wound Dressing Wound #1 Right,Anterior Lower Leg o Silver Collagen - Moisten with normal saline and place directly on wound bed. Secondary Dressing Wound #1 Right,Anterior Lower Leg o ABD pad Dressing Change Frequency Wound #1 Right,Anterior Lower Leg o Change dressing every other day. Follow-up Appointments Wound #1 Right,Anterior Lower Leg o Return Appointment in 1 week. Edema Control Wound #1 Right,Anterior Lower Leg o Other: - 2-Layer of Tubi-Grip #F Wound #2 Left,Medial,Posterior Lower Leg o Other: - 2-Layer of Tubi-Grip #F Off-Loading Wound #1 Right,Anterior Lower Leg o Turn and reposition every 2 hours - Elevate legs when sitting Mcmillon, Gargi K. ([id number]) Electronic Signature(s) Signed: 07/23/2019 4:21:20 PM By: Harold Barban Signed: 07/24/2019 9:47:59 AM By: Worthy Keeler PA-C Entered By: Harold Barban on 07/22/2019 11:48:00 Caroline [name] ([id number]) -------------------------------------------------------------------------------- Problem List Details Patient Name: Caroline [name] Date of Service: 07/22/2019 10:45 AM Medical Record Number: [id number] Patient Account Number: [id number] Date of Birth/Sex: 12/09/1941 (77 y.o. F) Treating RN: Harold Barban Primary Care Provider: Threasa Alpha Other Clinician: Referring Provider: Threasa Alpha Treating Provider/Extender: Melburn Hake, Rosaleah Person Weeks in Treatment: 2 Active Problems ICD-10 Evaluated Encounter Code Description Active Date Today Diagnosis I87.2 Venous insufficiency (chronic) (peripheral) 07/15/2019 No Yes I89.0 Lymphedema, not elsewhere classified 07/15/2019 No Yes L97.812 Non-pressure chronic ulcer of other part of right lower leg 07/15/2019 No Yes with fat layer exposed I10 Essential (primary) hypertension 07/15/2019 No  Yes I48.20 Chronic atrial fibrillation, unspecified 07/15/2019 No Yes N18.4 Chronic kidney disease, stage 4 (severe) 07/15/2019 No Yes Z79.01 Long term (current) use of anticoagulants 07/15/2019 No Yes Inactive Problems Resolved Problems Electronic Signature(s) Signed: 07/22/2019 11:13:53 AM By: Worthy Keeler PA-C Entered By: Worthy Keeler on 07/22/2019 11:13:53 Caroline [name] ([id number]) -------------------------------------------------------------------------------- Progress Note Details Patient Name: Caroline [name] Date of Service: 07/22/2019 10:45 AM Medical Record Number: [id number] Patient Account Number: [id number] Date of Birth/Sex: 05-May-1941 (77 y.o. F) Treating RN: Harold Barban Primary Care Provider: Threasa Alpha Other Clinician: Referring Provider: Threasa Alpha Treating Provider/Extender:  STONE III, Ashleyanne Hemmingway Weeks in Treatment: 2 Subjective Chief Complaint Information obtained from Patient Weeping leg edema with skin breakdown right leg History of Present Illness (HPI) Pleasant 78 year old Caucasian female with history of leg edema, newly enrolled in the congestive heart failure clinic, pulmonary hypertension, chronic kidney disease stage IV, chronic A. fib, oral anticoagulation with Eliquis, presenting with increased leg edema and weeping from both legs, with recent skin tear on right lower extremity anterior leg caused by scratching according to patient. Patient also had a fall about 2 weeks ago and pulled her "groin muscle" after which she has had some difficulty walking and uses a walker. Patient actually presenting to the wound clinic for the right leg skin tear for which she was actually placed on Bactrim for 7 days that she is completing at this time. Patient is accompanied by her son and both are concerned that there may be infection in the right leg given skin changes they have noticed. 07/15/2019 upon evaluation patient's wound which is actually on the  right anterior lower leg appears to be doing better when compared to the prior weeks evaluation. Overall I am pleased that though the wound measurement has not improved to significantly the overall appearance is greatly improved and this appears to be healing quite nicely. The patient is likewise pleased with how things seem to be at this point. I do think that she might benefit from compression therapy. Otherwise I think that the Xeroform gauze dressing has been of benefit for her and I am hopeful that she could continue to benefit from this. 07/22/2019 on evaluation today patient actually appears to be doing okay with regard to her ulcers on the lower extremities. She has been tolerating the dressing changes without complication which is good news. With that being said currently we have been using Xeroform to the areas I think she may benefit from switching to a silver collagen dressing as this was more effective in helping to obtain a skin growth. No fevers, chills, nausea, vomiting, or diarrhea. Patient History Information obtained from Patient. Family History Heart Disease - Father, Hypertension - Father, Lung Disease - Siblings, No family history of Cancer, Diabetes, Hereditary Spherocytosis, Kidney Disease, Seizures, Stroke, Thyroid Problems, Tuberculosis. Social History Never smoker, Marital Status - Married, Alcohol Use - Rarely, Drug Use - No History, Caffeine Use - Daily. Medical History Respiratory Denies history of Aspiration, Asthma, Chronic Obstructive Pulmonary Disease (COPD), Pneumothorax, Sleep Apnea, Tuberculosis Cardiovascular Patient has history of Congestive Heart Failure, Coronary Artery Disease, Hypertension Denies history of Angina, Arrhythmia, Deep Vein Thrombosis, Hypotension, Myocardial Infarction, Peripheral Arterial Disease, Peripheral Venous Disease, Phlebitis, Vasculitis Integumentary (Skin) Ferrall, Vennessa K. ([id number]) Denies history of History of Burn,  History of pressure wounds Musculoskeletal Patient has history of Gout, Osteoarthritis Denies history of Rheumatoid Arthritis, Osteomyelitis Neurologic Denies history of Dementia, Neuropathy, Quadriplegia, Paraplegia, Seizure Disorder Medical And Surgical History Notes Respiratory Pulmonary HTN Cardiovascular HLD Neurologic TIA 2018 Review of Systems (ROS) Constitutional Symptoms (General Health) Denies complaints or symptoms of Fatigue, Fever, Chills, Marked Weight Change. Respiratory Denies complaints or symptoms of Chronic or frequent coughs, Shortness of Breath. Cardiovascular Complains or has symptoms of LE edema. Denies complaints or symptoms of Chest pain. Psychiatric Denies complaints or symptoms of Anxiety, Claustrophobia. Objective Constitutional Well-nourished and well-hydrated in no acute distress. Vitals Time Taken: 11:12 AM, Height: 63 in, Weight: 155 lbs, BMI: 27.5, Temperature: 98.3 F, Pulse: 76 bpm, Respiratory Rate: 16 breaths/min, Blood Pressure: 142/88 mmHg. Respiratory normal breathing without difficulty.  clear to auscultation bilaterally. Cardiovascular regular rate and rhythm with normal S1, S2. Psychiatric this patient is able to make decisions and demonstrates good insight into disease process. Alert and Oriented x 3. pleasant and cooperative. General Notes: Upon inspection today patient's wound bed actually showed signs of excellent granulation at this time. There does not appear to be any signs of infection no sharp debridement was necessary mechanical debridement was performed which she did not have any difficulties with tolerating. Unfortunately the Curlex and Coban wraps did not seem to want to stay up again we cannot wrap with anything stronger from a compression standpoint simply due to the fact that we do not know her arterial status as of yet she actually goes for testing tomorrow. RYLEN, HOU ([id number]) Integumentary (Hair,  Skin) Wound #1 status is Open. Original cause of wound was Trauma. The wound is located on the Right,Anterior Lower Leg. The wound measures 1.9cm length x 1.3cm width x 0.1cm depth; 1.94cm^2 area and 0.194cm^3 volume. There is Fat Layer (Subcutaneous Tissue) Exposed exposed. There is no tunneling or undermining noted. There is a medium amount of serous drainage noted. The wound margin is flat and intact. There is small (1-33%) pink granulation within the wound bed. There is a large (67-100%) amount of necrotic tissue within the wound bed including Adherent Slough. Wound #2 status is Open. Original cause of wound was Gradually Appeared. The wound is located on the Left,Medial,Posterior Lower Leg. The wound measures 0.4cm length x 0.4cm width x 0.1cm depth; 0.126cm^2 area and 0.013cm^3 volume. There is Fat Layer (Subcutaneous Tissue) Exposed exposed. There is no tunneling or undermining noted. There is a medium amount of serous drainage noted. The wound margin is flat and intact. There is small (1-33%) red granulation within the wound bed. There is a large (67-100%) amount of necrotic tissue within the wound bed including Adherent Slough. Assessment Active Problems ICD-10 Venous insufficiency (chronic) (peripheral) Lymphedema, not elsewhere classified Non-pressure chronic ulcer of other part of right lower leg with fat layer exposed Essential (primary) hypertension Chronic atrial fibrillation, unspecified Chronic kidney disease, stage 4 (severe) Long term (current) use of anticoagulants Plan Wound Cleansing: Wound #1 Right,Anterior Lower Leg: Dial antibacterial soap, wash wounds, rinse and pat dry prior to dressing wounds Primary Wound Dressing: Wound #1 Right,Anterior Lower Leg: Silver Collagen - Moisten with normal saline and place directly on wound bed. Secondary Dressing: Wound #1 Right,Anterior Lower Leg: ABD pad Dressing Change Frequency: Wound #1 Right,Anterior Lower  Leg: Change dressing every other day. Follow-up Appointments: Wound #1 Right,Anterior Lower Leg: Return Appointment in 1 week. Edema Control: Wound #1 Right,Anterior Lower Leg: Other: - 2-Layer of Tubi-Grip #F Wound #2 Left,Medial,Posterior Lower Leg: Other: - 2-Layer of Tubi-Grip #F Teo, Amiliana K. ([id number]) Off-Loading: Wound #1 Right,Anterior Lower Leg: Turn and reposition every 2 hours - Elevate legs when sitting 1. I am going to suggest that we switch to a silver collagen dressing as I think this was more effective in helping to effectively achieve faster epithelialization. The patient is in agreement with this plan. 2. I am and I suggested we continue with some mild compression we will use Tubigrip for the time being depending on how this does and what her arterial studies show we may increase the compression in the future. 3. She actually goes for her arterial testing tomorrow. We will see patient back for reevaluation in 1 week here in the clinic. If anything worsens or changes patient will contact our office for additional  recommendations. Electronic Signature(s) Signed: 07/22/2019 5:33:48 PM By: Worthy Keeler PA-C Entered By: Worthy Keeler on 07/22/2019 17:33:48 Caroline [name] ([id number]) -------------------------------------------------------------------------------- ROS/PFSH Details Patient Name: Caroline [name] Date of Service: 07/22/2019 10:45 AM Medical Record Number: [id number] Patient Account Number: [id number] Date of Birth/Sex: February 21, 1941 (77 y.o. F) Treating RN: Harold Barban Primary Care Provider: Threasa Alpha Other Clinician: Referring Provider: Threasa Alpha Treating Provider/Extender: STONE III, Anthonymichael Munday Weeks in Treatment: 2 Information Obtained From Patient Constitutional Symptoms (General Health) Complaints and Symptoms: Negative for: Fatigue; Fever; Chills; Marked Weight Change Respiratory Complaints and Symptoms: Negative for:  Chronic or frequent coughs; Shortness of Breath Medical History: Negative for: Aspiration; Asthma; Chronic Obstructive Pulmonary Disease (COPD); Pneumothorax; Sleep Apnea; Tuberculosis Past Medical History Notes: Pulmonary HTN Cardiovascular Complaints and Symptoms: Positive for: LE edema Negative for: Chest pain Medical History: Positive for: Congestive Heart Failure; Coronary Artery Disease; Hypertension Negative for: Angina; Arrhythmia; Deep Vein Thrombosis; Hypotension; Myocardial Infarction; Peripheral Arterial Disease; Peripheral Venous Disease; Phlebitis; Vasculitis Past Medical History Notes: HLD Psychiatric Complaints and Symptoms: Negative for: Anxiety; Claustrophobia Integumentary (Skin) Medical History: Negative for: History of Burn; History of pressure wounds Musculoskeletal Medical History: Positive for: Gout; Osteoarthritis Negative for: Rheumatoid Arthritis; Osteomyelitis Neurologic EMAAN, GARY. ([id number]) Medical History: Negative for: Dementia; Neuropathy; Quadriplegia; Paraplegia; Seizure Disorder Past Medical History Notes: TIA 2018 Immunizations Pneumococcal Vaccine: Received Pneumococcal Vaccination: Yes Implantable Devices None Family and Social History Cancer: No; Diabetes: No; Heart Disease: Yes - Father; Hereditary Spherocytosis: No; Hypertension: Yes - Father; Kidney Disease: No; Lung Disease: Yes - Siblings; Seizures: No; Stroke: No; Thyroid Problems: No; Tuberculosis: No; Never smoker; Marital Status - Married; Alcohol Use: Rarely; Drug Use: No History; Caffeine Use: Daily; Financial Concerns: No; Food, Clothing or Shelter Needs: No; Support System Lacking: No; Transportation Concerns: No Physician Affirmation I have reviewed and agree with the above information. Electronic Signature(s) Signed: 07/23/2019 4:21:20 PM By: Harold Barban Signed: 07/24/2019 9:47:59 AM By: Worthy Keeler PA-C Entered By: Worthy Keeler on 07/22/2019  17:32:07 Caroline [name] ([id number]) -------------------------------------------------------------------------------- SuperBill Details Patient Name: Caroline [name] Date of Service: 07/22/2019 Medical Record Number: [id number] Patient Account Number: [id number] Date of Birth/Sex: 04/05/1941 (77 y.o. F) Treating RN: Harold Barban Primary Care Provider: Threasa Alpha Other Clinician: Referring Provider: Threasa Alpha Treating Provider/Extender: STONE III, Shawntina Diffee Weeks in Treatment: 2 Diagnosis Coding ICD-10 Codes Code Description I87.2 Venous insufficiency (chronic) (peripheral) I89.0 Lymphedema, not elsewhere classified L97.812 Non-pressure chronic ulcer of other part of right lower leg with fat layer exposed Raymond (primary) hypertension I48.20 Chronic atrial fibrillation, unspecified N18.4 Chronic kidney disease, stage 4 (severe) Z79.01 Long term (current) use of anticoagulants Facility Procedures CPT4 Code: [id number] Description: 99213 - WOUND CARE VISIT-LEV 3 EST PT Modifier: Quantity: 1 Physician Procedures CPT4 Code Description: [id number] 70962 - WC PHYS LEVEL 4 - EST PT ICD-10 Diagnosis Description I87.2 Venous insufficiency (chronic) (peripheral) I89.0 Lymphedema, not elsewhere classified L97.812 Non-pressure chronic ulcer of other part of right lower leg  w I10 Essential (primary) hypertension Modifier: ith fat layer expo Quantity: 1 sed Electronic Signature(s) Signed: 07/22/2019 5:34:12 PM By: Worthy Keeler PA-C Entered By: Worthy Keeler on 07/22/2019 17:34:12

## 2019-07-23 ENCOUNTER — Encounter: Payer: Self-pay | Admitting: Cardiovascular Disease

## 2019-07-23 ENCOUNTER — Ambulatory Visit (INDEPENDENT_AMBULATORY_CARE_PROVIDER_SITE_OTHER): Payer: Medicare Other

## 2019-07-23 ENCOUNTER — Ambulatory Visit (INDEPENDENT_AMBULATORY_CARE_PROVIDER_SITE_OTHER): Payer: Medicare Other | Admitting: Cardiovascular Disease

## 2019-07-23 VITALS — BP 130/94 | HR 82 | Ht 63.0 in | Wt 155.0 lb

## 2019-07-23 DIAGNOSIS — I480 Paroxysmal atrial fibrillation: Secondary | ICD-10-CM

## 2019-07-23 DIAGNOSIS — E782 Mixed hyperlipidemia: Secondary | ICD-10-CM

## 2019-07-23 DIAGNOSIS — I110 Hypertensive heart disease with heart failure: Secondary | ICD-10-CM

## 2019-07-23 DIAGNOSIS — I5032 Chronic diastolic (congestive) heart failure: Secondary | ICD-10-CM

## 2019-07-23 DIAGNOSIS — I639 Cerebral infarction, unspecified: Secondary | ICD-10-CM

## 2019-07-23 DIAGNOSIS — I872 Venous insufficiency (chronic) (peripheral): Secondary | ICD-10-CM

## 2019-07-23 DIAGNOSIS — S81801S Unspecified open wound, right lower leg, sequela: Secondary | ICD-10-CM

## 2019-07-23 DIAGNOSIS — I272 Pulmonary hypertension, unspecified: Secondary | ICD-10-CM

## 2019-07-23 DIAGNOSIS — I050 Rheumatic mitral stenosis: Secondary | ICD-10-CM

## 2019-07-23 DIAGNOSIS — R6889 Other general symptoms and signs: Secondary | ICD-10-CM | POA: Diagnosis not present

## 2019-07-23 DIAGNOSIS — N184 Chronic kidney disease, stage 4 (severe): Secondary | ICD-10-CM | POA: Diagnosis not present

## 2019-07-23 DIAGNOSIS — R0609 Other forms of dyspnea: Secondary | ICD-10-CM

## 2019-07-23 DIAGNOSIS — L97909 Non-pressure chronic ulcer of unspecified part of unspecified lower leg with unspecified severity: Secondary | ICD-10-CM

## 2019-07-23 DIAGNOSIS — I6523 Occlusion and stenosis of bilateral carotid arteries: Secondary | ICD-10-CM

## 2019-07-23 NOTE — Progress Notes (Signed)
SECRET, KRISTENSEN ([id number]) Visit Report for 07/22/2019 Arrival Information Details Patient Name: Caroline [name], Caroline [name] Date of Service: 07/22/2019 10:45 AM Medical Record Number: [id number] Patient Account Number: [id number] Date of Birth/Sex: 11/11/1941 (77 y.o. F) Treating RN: Harold Barban Primary Care Zion Lint: Threasa Alpha Other Clinician: Referring Ltanya Bayley: Threasa Alpha Treating Sundeep Destin/Extender: Melburn Hake, HOYT Weeks in Treatment: 2 Visit Information History Since Last Visit Added or deleted any medications: No Patient Arrived: Walker Any new allergies or adverse reactions: No Arrival Time: 11:10 Had a fall or experienced change in No Accompanied By: husband activities of daily living that may affect Transfer Assistance: None risk of falls: Patient Identification Verified: Yes Signs or symptoms of abuse/neglect since last visito No Secondary Verification Process Yes Hospitalized since last visit: No Completed: Implantable device outside of the clinic excluding No Patient Has Alerts: Yes cellular tissue based products placed in the center Patient Alerts: Patient on Blood since last visit: Thinner Has Dressing in Place as Prescribed: Yes Eliquis Pain Present Now: No Electronic Signature(s) Signed: 07/22/2019 11:52:04 AM By: Lorine Bears RCP, RRT, CHT Entered By: Lorine Bears on 07/22/2019 11:10:42 Caroline [name] ([id number]) -------------------------------------------------------------------------------- Clinic Level of Care Assessment Details Patient Name: Caroline [name] Date of Service: 07/22/2019 10:45 AM Medical Record Number: [id number] Patient Account Number: [id number] Date of Birth/Sex: 08-Oct-1941 (77 y.o. F) Treating RN: Harold Barban Primary Care Rockie Vawter: Threasa Alpha Other Clinician: Referring Ladona Rosten: Threasa Alpha Treating Herberth Deharo/Extender: Melburn Hake, HOYT Weeks in Treatment: 2 Clinic Level of Care  Assessment Items TOOL 4 Quantity Score []  - Use when only an EandM is performed on FOLLOW-UP visit 0 ASSESSMENTS - Nursing Assessment / Reassessment X - Reassessment of Co-morbidities (includes updates in patient status) 1 10 X- 1 5 Reassessment of Adherence to Treatment Plan ASSESSMENTS - Wound and Skin Assessment / Reassessment X - Simple Wound Assessment / Reassessment - one wound 1 5 []  - 0 Complex Wound Assessment / Reassessment - multiple wounds []  - 0 Dermatologic / Skin Assessment (not related to wound area) ASSESSMENTS - Focused Assessment []  - Circumferential Edema Measurements - multi extremities 0 []  - 0 Nutritional Assessment / Counseling / Intervention []  - 0 Lower Extremity Assessment (monofilament, tuning fork, pulses) []  - 0 Peripheral Arterial Disease Assessment (using hand held doppler) ASSESSMENTS - Ostomy and/or Continence Assessment and Care []  - Incontinence Assessment and Management 0 []  - 0 Ostomy Care Assessment and Management (repouching, etc.) PROCESS - Coordination of Care X - Simple Patient / Family Education for ongoing care 1 15 []  - 0 Complex (extensive) Patient / Family Education for ongoing care []  - 0 Staff obtains Programmer, systems, Records, Test Results / Process Orders []  - 0 Staff telephones HHA, Nursing Homes / Clarify orders / etc []  - 0 Routine Transfer to another Facility (non-emergent condition) []  - 0 Routine Hospital Admission (non-emergent condition) []  - 0 New Admissions / Biomedical engineer / Ordering NPWT, Apligraf, etc. []  - 0 Emergency Hospital Admission (emergent condition) X- 1 10 Simple Discharge Coordination Caroline [name], BRIEL. ([id number]) []  - 0 Complex (extensive) Discharge Coordination PROCESS - Special Needs []  - Pediatric / Minor Patient Management 0 []  - 0 Isolation Patient Management []  - 0 Hearing / Language / Visual special needs []  - 0 Assessment of Community assistance (transportation, D/C planning,  etc.) []  - 0 Additional assistance / Altered mentation []  - 0 Support Surface(s) Assessment (bed, cushion, seat, etc.) INTERVENTIONS - Wound Cleansing / Measurement []  - Simple Wound Cleansing - one wound  0 X- 2 5 Complex Wound Cleansing - multiple wounds []  - 0 Wound Imaging (photographs - any number of wounds) []  - 0 Wound Tracing (instead of photographs) []  - 0 Simple Wound Measurement - one wound X- 2 5 Complex Wound Measurement - multiple wounds INTERVENTIONS - Wound Dressings X - Small Wound Dressing one or multiple wounds 2 10 []  - 0 Medium Wound Dressing one or multiple wounds []  - 0 Large Wound Dressing one or multiple wounds []  - 0 Application of Medications - topical []  - 0 Application of Medications - injection INTERVENTIONS - Miscellaneous []  - External ear exam 0 []  - 0 Specimen Collection (cultures, biopsies, blood, body fluids, etc.) []  - 0 Specimen(s) / Culture(s) sent or taken to Lab for analysis []  - 0 Patient Transfer (multiple staff / Civil Service fast streamer / Similar devices) []  - 0 Simple Staple / Suture removal (25 or less) []  - 0 Complex Staple / Suture removal (26 or more) []  - 0 Hypo / Hyperglycemic Management (close monitor of Blood Glucose) []  - 0 Ankle / Brachial Index (ABI) - do not check if billed separately X- 1 5 Vital Signs Caroline [name], [name] ([id number]) Has the patient been seen at the hospital within the last three years: Yes Total Score: 90 Level Of Care: New/Established - Level 3 Electronic Signature(s) Signed: 07/23/2019 4:21:20 PM By: Harold Barban Entered By: Harold Barban on 07/22/2019 Caroline [name], Caroline [name]. ([id number]) -------------------------------------------------------------------------------- Encounter Discharge Information Details Patient Name: Caroline [name] Date of Service: 07/22/2019 10:45 AM Medical Record Number: [id number] Patient Account Number: [id number] Date of Birth/Sex: [birth year] (77 y.o. F) Treating  RN: Army Melia Primary Care Renley Banwart: Threasa Alpha Other Clinician: Referring Blessings Inglett: Threasa Alpha Treating Marye Eagen/Extender: Melburn Hake, HOYT Weeks in Treatment: 2 Encounter Discharge Information Items Discharge Condition: Stable Ambulatory Status: Walker Discharge Destination: Home Transportation: Private Auto Accompanied By: husband Schedule Follow-up Appointment: Yes Clinical Summary of Care: Electronic Signature(s) Signed: 07/22/2019 11:58:31 AM By: Army Melia Entered By: Army Melia on 07/22/2019 11:58:31 Caroline [name] ([id number]) -------------------------------------------------------------------------------- Lower Extremity Assessment Details Patient Name: Caroline [name] Date of Service: 07/22/2019 10:45 AM Medical Record Number: [id number] Patient Account Number: [id number] Date of Birth/Sex: 01-10-1941 (77 y.o. F) Treating RN: Montey Hora Primary Care Kolton Kienle: Threasa Alpha Other Clinician: Referring Rene Gonsoulin: Threasa Alpha Treating Kadarius Cuffe/Extender: STONE III, HOYT Weeks in Treatment: 2 Edema Assessment Assessed: [Left: No] [Right: No] Edema: [Left: Yes] [Right: Yes] Calf Left: Right: Point of Measurement: 29 cm From Medial Instep 36.5 cm 33 cm Ankle Left: Right: Point of Measurement: 11 cm From Medial Instep 21 cm 21.3 cm Vascular Assessment Pulses: Dorsalis Pedis Palpable: [Left:Yes] [Right:Yes] Electronic Signature(s) Signed: 07/22/2019 4:17:28 PM By: Montey Hora Entered By: Montey Hora on 07/22/2019 11:27:19 Caroline [name] ([id number]) -------------------------------------------------------------------------------- Multi Wound Chart Details Patient Name: Caroline [name] Date of Service: 07/22/2019 10:45 AM Medical Record Number: [id number] Patient Account Number: [id number] Date of Birth/Sex: 06/10/1941 (77 y.o. F) Treating RN: Harold Barban Primary Care Lekisha Mcghee: Threasa Alpha Other Clinician: Referring Hansford Hirt:  Threasa Alpha Treating Kenya Shiraishi/Extender: STONE III, HOYT Weeks in Treatment: 2 Vital Signs Height(in): 63 Pulse(bpm): 76 Weight(lbs): 155 Blood Pressure(mmHg): 142/88 Body Mass Index(BMI): 27 Temperature(F): 98.3 Respiratory Rate 16 (breaths/min): Photos: [N/A:N/A] Wound Location: Right Lower Leg - Anterior Left Lower Leg - Medial, N/A Posterior Wounding Event: Trauma Gradually Appeared N/A Primary Etiology: Venous Leg Ulcer Venous Leg Ulcer N/A Comorbid History: Congestive Heart Failure, Congestive Heart Failure, N/A Coronary Artery Disease, Coronary Artery Disease, Hypertension, Gout, Hypertension,  Gout, Osteoarthritis Osteoarthritis Date Acquired: 06/24/2019 07/22/2019 N/A Weeks of Treatment: 2 0 N/A Wound Status: Open Open N/A Measurements L x W x D 1.9x1.3x0.1 0.4x0.4x0.1 N/A (cm) Area (cm) : 1.94 0.126 N/A Volume (cm) : 0.194 0.013 N/A % Reduction in Area: 56.70% N/A N/A % Reduction in Volume: 56.70% N/A N/A Classification: Full Thickness Without Full Thickness Without N/A Exposed Support Structures Exposed Support Structures Exudate Amount: Medium Medium N/A Exudate Type: Serous Serous N/A Exudate Color: amber amber N/A Wound Margin: Flat and Intact Flat and Intact N/A Granulation Amount: Small (1-33%) Small (1-33%) N/A Granulation Quality: Pink Red N/A Necrotic Amount: Large (67-100%) Large (67-100%) N/A Exposed Structures: Fat Layer (Subcutaneous Fat Layer (Subcutaneous N/A Tissue) Exposed: Yes Tissue) Exposed: Yes Fascia: No Fascia: No Caroline [name], [name] ([id number]) Tendon: No Tendon: No Muscle: No Muscle: No Joint: No Joint: No Bone: No Bone: No Epithelialization: Small (1-33%) None N/A Treatment Notes Electronic Signature(s) Signed: 07/23/2019 4:21:20 PM By: Harold Barban Entered By: Harold Barban on 07/22/2019 11:39:03 Caroline [name]  ([id number]) -------------------------------------------------------------------------------- Multi-Disciplinary Care Plan Details Patient Name: Caroline [name] Date of Service: 07/22/2019 10:45 AM Medical Record Number: [id number] Patient Account Number: [id number] Date of Birth/Sex: [birth year] (77 y.o. F) Treating RN: Harold Barban Primary Care Kaylise Blakeley: Threasa Alpha Other Clinician: Referring Ariah Mower: Threasa Alpha Treating Keniya Schlotterbeck/Extender: STONE III, HOYT Weeks in Treatment: 2 Active Inactive Venous Leg Ulcer Nursing Diagnoses: Knowledge deficit related to disease process and management Goals: Patient will maintain optimal edema control Date Initiated: 07/08/2019 Target Resolution Date: 08/08/2019 Goal Status: Active Interventions: Assess peripheral edema status every visit. Treatment Activities: Non-invasive vascular studies : 07/08/2019 Notes: Wound/Skin Impairment Nursing Diagnoses: Impaired tissue integrity Goals: Ulcer/skin breakdown will have a volume reduction of 30% by week 4 Date Initiated: 07/08/2019 Target Resolution Date: 08/08/2019 Goal Status: Active Interventions: Assess patient/caregiver ability to obtain necessary supplies Assess patient/caregiver ability to perform ulcer/skin care regimen upon admission and as needed Assess ulceration(s) every visit Notes: Electronic Signature(s) Signed: 07/23/2019 4:21:20 PM By: Harold Barban Entered By: Harold Barban on 07/22/2019 11:38:20 Caroline [name] ([id number]) -------------------------------------------------------------------------------- Pain Assessment Details Patient Name: Caroline [name] Date of Service: 07/22/2019 10:45 AM Medical Record Number: [id number] Patient Account Number: [id number] Date of Birth/Sex: 06-11-1941 (77 y.o. F) Treating RN: Harold Barban Primary Care Lenisha Lacap: Threasa Alpha Other Clinician: Referring Jilberto Vanderwall: Threasa Alpha Treating Alaine Loughney/Extender: STONE III,  HOYT Weeks in Treatment: 2 Active Problems Location of Pain Severity and Description of Pain Patient Has Paino No Site Locations Pain Management and Medication Current Pain Management: Electronic Signature(s) Signed: 07/22/2019 11:52:04 AM By: Lorine Bears RCP, RRT, CHT Signed: 07/23/2019 4:21:20 PM By: Harold Barban Entered By: Lorine Bears on 07/22/2019 11:10:50 Caroline [name] ([id number]) -------------------------------------------------------------------------------- Patient/Caregiver Education Details Patient Name: Caroline [name] Date of Service: 07/22/2019 10:45 AM Medical Record Number: [id number] Patient Account Number: [id number] Date of Birth/Gender: [birth year] (77 y.o. F) Treating RN: Harold Barban Primary Care Physician: Threasa Alpha Other Clinician: Referring Physician: Threasa Alpha Treating Physician/Extender: Sharalyn Ink in Treatment: 2 Education Assessment Education Provided To: Patient Education Topics Provided Wound/Skin Impairment: Handouts: Caring for Your Ulcer Methods: Demonstration, Explain/Verbal Responses: State content correctly Electronic Signature(s) Signed: 07/23/2019 4:21:20 PM By: Harold Barban Entered By: Harold Barban on 07/22/2019 11:39:57 Caroline [name] ([id number]) -------------------------------------------------------------------------------- Wound Assessment Details Patient Name: Caroline [name] Date of Service: 07/22/2019 10:45 AM Medical Record Number: [id number] Patient Account Number: [id number] Date of Birth/Sex: Jul 17, 1941 (77 y.o. F) Treating RN: Montey Hora Primary Care  Bellamy Rubey: Threasa Alpha Other Clinician: Referring Ahmiya Abee: Threasa Alpha Treating Mead Slane/Extender: STONE III, HOYT Weeks in Treatment: 2 Wound Status Wound Number: 1 Primary Venous Leg Ulcer Etiology: Wound Location: Right Lower Leg - Anterior Wound Open Wounding Event:  Trauma Status: Date Acquired: 06/24/2019 Comorbid Congestive Heart Failure, Coronary Artery Weeks Of Treatment: 2 History: Disease, Hypertension, Gout, Osteoarthritis Clustered Wound: No Photos Wound Measurements Length: (cm) 1.9 Width: (cm) 1.3 Depth: (cm) 0.1 Area: (cm) 1.94 Volume: (cm) 0.194 % Reduction in Area: 56.7% % Reduction in Volume: 56.7% Epithelialization: Small (1-33%) Tunneling: No Undermining: No Wound Description Full Thickness Without Exposed Support Classification: Structures Wound Margin: Flat and Intact Exudate Medium Amount: Exudate Type: Serous Exudate Color: amber Foul Odor After Cleansing: No Slough/Fibrino Yes Wound Bed Granulation Amount: Small (1-33%) Exposed Structure Granulation Quality: Pink Fascia Exposed: No Necrotic Amount: Large (67-100%) Fat Layer (Subcutaneous Tissue) Exposed: Yes Necrotic Quality: Adherent Slough Tendon Exposed: No Muscle Exposed: No Joint Exposed: No Bone Exposed: No Caroline [name], [name] ([id number]) Treatment Notes Wound #1 (Right, Anterior Lower Leg) Notes silver collagen, ABD, conform, double layer tubigrip E Electronic Signature(s) Signed: 07/22/2019 4:17:28 PM By: Montey Hora Entered By: Montey Hora on 07/22/2019 11:25:30 Caroline [name] ([id number]) -------------------------------------------------------------------------------- Wound Assessment Details Patient Name: Caroline [name] Date of Service: 07/22/2019 10:45 AM Medical Record Number: [id number] Patient Account Number: [id number] Date of Birth/Sex: [birth year] (77 y.o. F) Treating RN: Montey Hora Primary Care Cristin Penaflor: Threasa Alpha Other Clinician: Referring Dajha Urquilla: Threasa Alpha Treating Ebonique Hallstrom/Extender: STONE III, HOYT Weeks in Treatment: 2 Wound Status Wound Number: 2 Primary Venous Leg Ulcer Etiology: Wound Location: Left Lower Leg - Medial, Posterior Wound Open Wounding Event: Gradually Appeared Status: Date  Acquired: 07/22/2019 Comorbid Congestive Heart Failure, Coronary Artery Weeks Of Treatment: 0 History: Disease, Hypertension, Gout, Osteoarthritis Clustered Wound: No Photos Wound Measurements Length: (cm) 0.4 Width: (cm) 0.4 Depth: (cm) 0.1 Area: (cm) 0.126 Volume: (cm) 0.013 % Reduction in Area: % Reduction in Volume: Epithelialization: None Tunneling: No Undermining: No Wound Description Full Thickness Without Exposed Support Classification: Structures Wound Margin: Flat and Intact Exudate Medium Amount: Exudate Type: Serous Exudate Color: amber Foul Odor After Cleansing: No Slough/Fibrino Yes Wound Bed Granulation Amount: Small (1-33%) Exposed Structure Granulation Quality: Red Fascia Exposed: No Necrotic Amount: Large (67-100%) Fat Layer (Subcutaneous Tissue) Exposed: Yes Necrotic Quality: Adherent Slough Tendon Exposed: No Muscle Exposed: No Joint Exposed: No Bone Exposed: No Caroline [name], [name] ([id number]) Treatment Notes Wound #2 (Left, Medial, Posterior Lower Leg) Notes silver collagen, ABD, conform, double layer tubigrip E Electronic Signature(s) Signed: 07/22/2019 4:17:28 PM By: Montey Hora Entered By: Montey Hora on 07/22/2019 11:25:01 Caroline [name] ([id number]) -------------------------------------------------------------------------------- Vitals Details Patient Name: Caroline [name] Date of Service: 07/22/2019 10:45 AM Medical Record Number: [id number] Patient Account Number: [id number] Date of Birth/Sex: [birth year] (77 y.o. F) Treating RN: Harold Barban Primary Care Phaedra Colgate: Threasa Alpha Other Clinician: Referring Dejon Lukas: Threasa Alpha Treating Demetrice Amstutz/Extender: STONE III, HOYT Weeks in Treatment: 2 Vital Signs Time Taken: 11:12 Temperature (F): 98.3 Height (in): 63 Pulse (bpm): 76 Weight (lbs): 155 Respiratory Rate (breaths/min): 16 Body Mass Index (BMI): 27.5 Blood Pressure (mmHg): 142/88 Reference Range: 80 -  120 mg / dl Electronic Signature(s) Signed: 07/22/2019 11:52:04 AM By: Lorine Bears RCP, RRT, CHT Entered By: Lorine Bears on 07/22/2019 11:17:15

## 2019-07-23 NOTE — Patient Instructions (Signed)

## 2019-07-26 NOTE — Progress Notes (Signed)
Caroline [name] ([id number]) Visit Report for 07/15/2019 Chief Complaint Document Details Patient Name: Caroline [name] Date of Service: 07/15/2019 3:00 PM Medical Record Number: [id number] Patient Account Number: [id number] Date of Birth/Sex: 03/12/1941 (78 y.o. F) Treating RN: Cornell Barman Primary Care Provider: Threasa Alpha Other Clinician: Referring Provider: Threasa Alpha Treating Provider/Extender: Melburn Hake, Lenwood Balsam Weeks in Treatment: 1 Information Obtained from: Patient Chief Complaint Weeping leg edema with skin breakdown right leg Electronic Signature(s) Signed: 07/19/2019 6:08:28 PM By: Worthy Keeler PA-C Entered By: Worthy Keeler on 07/15/2019 15:33:07 Caroline [name] ([id number]) -------------------------------------------------------------------------------- HPI Details Patient Name: Caroline [name] Date of Service: 07/15/2019 3:00 PM Medical Record Number: [id number] Patient Account Number: [id number] Date of Birth/Sex: 1941-02-25 (78 y.o. F) Treating RN: Cornell Barman Primary Care Provider: Threasa Alpha Other Clinician: Referring Provider: Threasa Alpha Treating Provider/Extender: Melburn Hake, Dajuana Palen Weeks in Treatment: 1 History of Present Illness HPI Description: Pleasant 78 year old Caucasian female with history of leg edema, newly enrolled in the congestive heart failure clinic, pulmonary hypertension, chronic kidney disease stage IV, chronic A. fib, oral anticoagulation with Eliquis, presenting with increased leg edema and weeping from both legs, with recent skin tear on right lower extremity anterior leg caused by scratching according to patient. Patient also had a fall about 2 weeks ago and pulled her "groin muscle" after which she has had some difficulty walking and uses a walker. Patient actually presenting to the wound clinic for the right leg skin tear for which she was actually placed on Bactrim for 7 days that she is completing at this time. Patient  is accompanied by her son and both are concerned that there may be infection in the right leg given skin changes they have noticed. 07/15/2019 upon evaluation patient's wound which is actually on the right anterior lower leg appears to be doing better when compared to the prior weeks evaluation. Overall I am pleased that though the wound measurement has not improved to significantly the overall appearance is greatly improved and this appears to be healing quite nicely. The patient is likewise pleased with how things seem to be at this point. I do think that she might benefit from compression therapy. Otherwise I think that the Xeroform gauze dressing has been of benefit for her and I am hopeful that she could continue to benefit from this. Electronic Signature(s) Signed: 07/19/2019 6:02:49 PM By: Worthy Keeler PA-C Entered By: Worthy Keeler on 07/19/2019 18:02:49 Caroline [name] ([id number]) -------------------------------------------------------------------------------- Physical Exam Details Patient Name: Caroline [name] Date of Service: 07/15/2019 3:00 PM Medical Record Number: [id number] Patient Account Number: [id number] Date of Birth/Sex: Nov 07, 1941 (78 y.o. F) Treating RN: Cornell Barman Primary Care Provider: Threasa Alpha Other Clinician: Referring Provider: Threasa Alpha Treating Provider/Extender: STONE III, Katharin Schneider Weeks in Treatment: 1 Constitutional Well-nourished and well-hydrated in no acute distress. Respiratory normal breathing without difficulty. clear to auscultation bilaterally. Cardiovascular regular rate and rhythm with normal S1, S2. Psychiatric this patient is able to make decisions and demonstrates good insight into disease process. Alert and Oriented x 3. pleasant and cooperative. Notes Patient's wound bed currently did not require any sharp debridement it appears to be healing quite nicely though there is evidence of swelling I think the Kerlix and Covan can  definitely keep this under control. As far as the overall appearance of the surface of the wound is concerned she seems to be progressing quite nicely in my opinion. Electronic Signature(s) Signed: 07/19/2019 6:03:18 PM By: Worthy Keeler PA-C  Entered By: Worthy Keeler on 07/19/2019 18:03:17 Caroline [name] ([id number]) -------------------------------------------------------------------------------- Physician Orders Details Patient Name: Caroline [name] Date of Service: 07/15/2019 3:00 PM Medical Record Number: [id number] Patient Account Number: [id number] Date of Birth/Sex: 01-11-1941 (78 y.o. F) Treating RN: Cornell Barman Primary Care Provider: Threasa Alpha Other Clinician: Referring Provider: Threasa Alpha Treating Provider/Extender: Melburn Hake, Rozann Holts Weeks in Treatment: 1 Verbal / Phone Orders: No Diagnosis Coding Wound Cleansing Wound #1 Right,Anterior Lower Leg o May shower with protection. - Can cover wraps with plastic bags Primary Wound Dressing Wound #1 Right,Anterior Lower Leg o Xeroform Secondary Dressing Wound #1 Right,Anterior Lower Leg o ABD pad Dressing Change Frequency Wound #1 Right,Anterior Lower Leg o Change dressing every week Follow-up Appointments Wound #1 Right,Anterior Lower Leg o Other: - Return Thursday, July 23th Edema Control Wound #1 Right,Anterior Lower Leg o Kerlix and Coban - Bilateral Off-Loading Wound #1 Right,Anterior Lower Leg o Turn and reposition every 2 hours - Elevate legs when sitting Electronic Signature(s) Signed: 07/16/2019 9:33:28 AM By: Gretta Cool, BSN, RN, CWS, Kim RN, BSN Signed: 07/19/2019 6:08:28 PM By: Worthy Keeler PA-C Entered By: Gretta Cool, BSN, RN, CWS, Kim on 07/15/2019 15:24:49 Caroline [name] ([id number]) -------------------------------------------------------------------------------- Problem List Details Patient Name: Caroline [name] Date of Service: 07/15/2019 3:00 PM Medical Record Number:  [id number] Patient Account Number: [id number] Date of Birth/Sex: 12-26-1941 (78 y.o. 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 I10 Essential (primary) hypertension 07/15/2019 No Yes I48.20 Chronic atrial fibrillation, unspecified 07/15/2019 No Yes N18.4 Chronic kidney disease, stage 4 (severe) 07/15/2019 No Yes Z79.01 Long term (current) use of anticoagulants 07/15/2019 No Yes Inactive Problems Resolved Problems Electronic Signature(s) Signed: 07/19/2019 6:08:28 PM By: Worthy Keeler PA-C Entered By: Worthy Keeler on 07/15/2019 22:48:59 Caroline [name] ([id number]) -------------------------------------------------------------------------------- Progress Note Details Patient Name: Caroline [name] Date of Service: 07/15/2019 3:00 PM Medical Record Number: [id number] Patient Account Number: [id number] Date of Birth/Sex: 1941-09-10 (78 y.o. F) Treating RN: Cornell Barman Primary Care Provider: Threasa Alpha Other Clinician: Referring Provider: Threasa Alpha Treating Provider/Extender: Melburn Hake, Dartanyon Frankowski Weeks in Treatment: 1 Subjective Chief Complaint Information obtained from Patient Weeping leg edema with skin breakdown right leg History of Present Illness (HPI) Pleasant 78 year old Caucasian female with history of leg edema, newly enrolled in the congestive heart failure clinic, pulmonary hypertension, chronic kidney disease stage IV, chronic A. fib, oral anticoagulation with Eliquis, presenting with increased leg edema and  weeping from both legs, with recent skin tear on right lower extremity anterior leg caused by scratching according to patient. Patient also had a fall about 2 weeks ago and pulled her "groin muscle" after which she has had some difficulty walking and uses a walker. Patient actually presenting to the wound clinic for the right leg skin tear for which she was actually placed on Bactrim for 7 days that she is completing at this time. Patient is accompanied by her son and both are concerned that there may be infection in the right leg given skin changes they have noticed. 07/15/2019 upon evaluation patient's wound which is actually on the right anterior lower leg appears to be  doing better when compared to the prior weeks evaluation. Overall I am pleased that though the wound measurement has not improved to significantly the overall appearance is greatly improved and this appears to be healing quite nicely. The patient is likewise pleased with how things seem to be at this point. I do think that she might benefit from compression therapy. Otherwise I think that the Xeroform gauze dressing has been of benefit for her and I am hopeful that she could continue to benefit from this. Patient History Information obtained from Patient. Family History Heart Disease - Father, Hypertension - Father, Lung Disease - Siblings, No family history of Cancer, Diabetes, Hereditary Spherocytosis, Kidney Disease, Seizures, Stroke, Thyroid Problems, Tuberculosis. Social History Never smoker, Marital Status - Married, Alcohol Use - Rarely, Drug Use - No History, Caffeine Use - Daily. Medical History Respiratory Denies history of Aspiration, Asthma, Chronic Obstructive Pulmonary Disease (COPD), Pneumothorax, Sleep Apnea, Tuberculosis Cardiovascular Patient has history of Congestive Heart Failure, Coronary Artery Disease, Hypertension Denies history of Angina, Arrhythmia, Deep Vein Thrombosis, Hypotension, Myocardial  Infarction, Peripheral Arterial Disease, Peripheral Venous Disease, Phlebitis, Vasculitis Integumentary (Skin) Denies history of History of Burn, History of pressure wounds Musculoskeletal Patient has history of Gout, Osteoarthritis Denies history of Rheumatoid Arthritis, Osteomyelitis Neurologic Causey, Gregg K. ([id number]) Denies history of Dementia, Neuropathy, Quadriplegia, Paraplegia, Seizure Disorder Medical And Surgical History Notes Respiratory Pulmonary HTN Cardiovascular HLD Neurologic TIA 2018 Review of Systems (ROS) Constitutional Symptoms (General Health) Denies complaints or symptoms of Fatigue, Fever, Chills, Marked Weight Change. Respiratory Denies complaints or symptoms of Chronic or frequent coughs, Shortness of Breath. Cardiovascular Complains or has symptoms of LE edema. Denies complaints or symptoms of Chest pain. Psychiatric Denies complaints or symptoms of Anxiety, Claustrophobia. Objective Constitutional Well-nourished and well-hydrated in no acute distress. Vitals Time Taken: 3:04 PM, Height: 63 in, Weight: 155 lbs, BMI: 27.5, Temperature: 97.8 F, Pulse: 61 bpm, Respiratory Rate: 16 breaths/min, Blood Pressure: 134/78 mmHg. Respiratory normal breathing without difficulty. clear to auscultation bilaterally. Cardiovascular regular rate and rhythm with normal S1, S2. Psychiatric this patient is able to make decisions and demonstrates good insight into disease process. Alert and Oriented x 3. pleasant and cooperative. General Notes: Patient's wound bed currently did not require any sharp debridement it appears to be healing quite nicely though there is evidence of swelling I think the Kerlix and Covan can definitely keep this under control. As far as the overall appearance of the surface of the wound is concerned she seems to be progressing quite nicely in my opinion. Integumentary (Hair, Skin) Wound #1 status is Open. Original cause of wound was  Trauma. The wound is located on the Right,Anterior Lower Leg. The wound measures 2cm length x 1.2cm width x 0.1cm depth; 1.885cm^2 area and 0.188cm^3 volume. There is Fat Layer (Subcutaneous Tissue) Exposed exposed. There is no tunneling or undermining noted. There is a medium amount of serous drainage noted. The wound margin is flat and intact. There is small (1-33%) pink granulation within the wound bed. There is a Caroline [name], Caroline [name]. ([id number]) large (67-100%) amount of necrotic tissue within the wound bed including Adherent Slough. Assessment Active Problems ICD-10 Venous insufficiency (chronic) (peripheral) Lymphedema, not elsewhere classified Non-pressure chronic ulcer of other part of right lower leg with fat layer exposed Essential (primary) hypertension Chronic atrial fibrillation, unspecified Chronic kidney disease, stage 4 (severe) Long term (current) use of anticoagulants Plan Wound Cleansing: Wound #1 Right,Anterior Lower Leg: May shower with protection. - Can cover wraps with plastic  bags Primary Wound Dressing: Wound #1 Right,Anterior Lower Leg: Xeroform Secondary Dressing: Wound #1 Right,Anterior Lower Leg: ABD pad Dressing Change Frequency: Wound #1 Right,Anterior Lower Leg: Change dressing every week Follow-up Appointments: Wound #1 Right,Anterior Lower Leg: Other: - Return Thursday, July 23th Edema Control: Wound #1 Right,Anterior Lower Leg: Kerlix and Coban - Bilateral Off-Loading: Wound #1 Right,Anterior Lower Leg: Turn and reposition every 2 hours - Elevate legs when sitting 1. I am in a suggest that we continue with the Xeroform gauze dressing at this point since that seems to be doing well she does not have significant drainage that is also good news. I think that this is appropriate. 2. I will initiate a Kerlix and Coban wrap wrap bilateral hopefully this will keep things under control as far as swelling is concerned which I think will also be of  benefit. We will see the patient back for reevaluation for a nurse visit on Thursday and then subsequently we will see her back for a recheck in our clinic next week with me. If anything worsens or changes in the meantime she will contact the office and let me know. Caroline [name], Caroline [name] ([id number]) Electronic Signature(s) Signed: 07/19/2019 6:04:33 PM By: Worthy Keeler PA-C Entered By: Worthy Keeler on 07/19/2019 18:04:33 Caroline [name] ([id number]) -------------------------------------------------------------------------------- ROS/PFSH Details Patient Name: Caroline [name] Date of Service: 07/15/2019 3:00 PM Medical Record Number: [id number] Patient Account Number: [id number] Date of Birth/Sex: 07/10/1941 (78 y.o. F) Treating RN: Cornell Barman Primary Care Provider: Threasa Alpha Other Clinician: Referring Provider: Threasa Alpha Treating Provider/Extender: STONE III, Debralee Braaksma Weeks in Treatment: 1 Information Obtained From Patient Constitutional Symptoms (General Health) Complaints and Symptoms: Negative for: Fatigue; Fever; Chills; Marked Weight Change Respiratory Complaints and Symptoms: Negative for: Chronic or frequent coughs; Shortness of Breath Medical History: Negative for: Aspiration; Asthma; Chronic Obstructive Pulmonary Disease (COPD); Pneumothorax; Sleep Apnea; Tuberculosis Past Medical History Notes: Pulmonary HTN Cardiovascular Complaints and Symptoms: Positive for: LE edema Negative for: Chest pain Medical History: Positive for: Congestive Heart Failure; Coronary Artery Disease; Hypertension Negative for: Angina; Arrhythmia; Deep Vein Thrombosis; Hypotension; Myocardial Infarction; Peripheral Arterial Disease; Peripheral Venous Disease; Phlebitis; Vasculitis Past Medical History Notes: HLD Psychiatric Complaints and Symptoms: Negative for: Anxiety; Claustrophobia Integumentary (Skin) Medical History: Negative for: History of Burn; History of pressure  wounds Musculoskeletal Medical History: Positive for: Gout; Osteoarthritis Negative for: Rheumatoid Arthritis; Osteomyelitis Neurologic Caroline [name], ZADROZNY. ([id number]) Medical History: Negative for: Dementia; Neuropathy; Quadriplegia; Paraplegia; Seizure Disorder Past Medical History Notes: TIA 2018 Immunizations Pneumococcal Vaccine: Received Pneumococcal Vaccination: Yes Implantable Devices None Family and Social History Cancer: No; Diabetes: No; Heart Disease: Yes - Father; Hereditary Spherocytosis: No; Hypertension: Yes - Father; Kidney Disease: No; Lung Disease: Yes - Siblings; Seizures: No; Stroke: No; Thyroid Problems: No; Tuberculosis: No; Never smoker; Marital Status - Married; Alcohol Use: Rarely; Drug Use: No History; Caffeine Use: Daily; Financial Concerns: No; Food, Clothing or Shelter Needs: No; Support System Lacking: No; Transportation Concerns: No Physician Affirmation I have reviewed and agree with the above information. Electronic Signature(s) Signed: 07/19/2019 6:08:28 PM By: Worthy Keeler PA-C Signed: 07/26/2019 3:59:58 PM By: Gretta Cool, BSN, RN, CWS, Kim RN, BSN Entered By: Worthy Keeler on 07/19/2019 18:03:05 Caroline [name] ([id number]) -------------------------------------------------------------------------------- [place] Details Patient Name: Caroline [name] Date of Service: 07/15/2019 Medical Record Number: [id number] Patient Account Number: [id number] Date of Birth/Sex: 12/25/1941 (78 y.o. F) Treating RN: Cornell Barman Primary Care Provider: Threasa Alpha Other Clinician: Referring Provider: Threasa Alpha Treating Provider/Extender: Joaquim Lai  III, Maci Eickholt Weeks in Treatment: 1 Diagnosis Coding ICD-10 Codes Code Description I87.2 Venous insufficiency (chronic) (peripheral) I89.0 Lymphedema, not elsewhere classified L97.812 Non-pressure chronic ulcer of other part of right lower leg with fat layer exposed [place] (primary) hypertension I48.20  Chronic atrial fibrillation, unspecified N18.4 Chronic kidney disease, stage 4 (severe) Z79.01 Long term (current) use of anticoagulants Facility Procedures CPT4 Code: [id number] Description: 99213 - WOUND CARE VISIT-LEV 3 EST PT Modifier: Quantity: 1 Physician Procedures CPT4 Code Description: [id number] - WC PHYS LEVEL 4 - EST PT ICD-10 Diagnosis Description I89.0 Lymphedema, not elsewhere classified I87.2 Venous insufficiency (chronic) (peripheral) I10 Essential (primary) hypertension L97.812 Non-pressure chronic  ulcer of other part of right lower leg w Modifier: ith fat layer expo Quantity: 1 sed Electronic Signature(s) Signed: 07/19/2019 6:08:28 PM By: Worthy Keeler PA-C Entered By: Worthy Keeler on 07/15/2019 22:49:41

## 2019-07-29 ENCOUNTER — Other Ambulatory Visit: Payer: Self-pay

## 2019-07-29 ENCOUNTER — Encounter: Payer: Medicare Other | Attending: Physician Assistant | Admitting: Physician Assistant

## 2019-07-29 DIAGNOSIS — I130 Hypertensive heart and chronic kidney disease with heart failure and stage 1 through stage 4 chronic kidney disease, or unspecified chronic kidney disease: Secondary | ICD-10-CM | POA: Insufficient documentation

## 2019-07-29 DIAGNOSIS — Z8673 Personal history of transient ischemic attack (TIA), and cerebral infarction without residual deficits: Secondary | ICD-10-CM | POA: Diagnosis not present

## 2019-07-29 DIAGNOSIS — N184 Chronic kidney disease, stage 4 (severe): Secondary | ICD-10-CM | POA: Insufficient documentation

## 2019-07-29 DIAGNOSIS — Z7901 Long term (current) use of anticoagulants: Secondary | ICD-10-CM | POA: Insufficient documentation

## 2019-07-29 DIAGNOSIS — I872 Venous insufficiency (chronic) (peripheral): Secondary | ICD-10-CM | POA: Insufficient documentation

## 2019-07-29 DIAGNOSIS — L97812 Non-pressure chronic ulcer of other part of right lower leg with fat layer exposed: Secondary | ICD-10-CM | POA: Insufficient documentation

## 2019-07-29 DIAGNOSIS — I2510 Atherosclerotic heart disease of native coronary artery without angina pectoris: Secondary | ICD-10-CM | POA: Insufficient documentation

## 2019-07-29 DIAGNOSIS — I272 Pulmonary hypertension, unspecified: Secondary | ICD-10-CM | POA: Insufficient documentation

## 2019-07-29 DIAGNOSIS — I482 Chronic atrial fibrillation, unspecified: Secondary | ICD-10-CM | POA: Insufficient documentation

## 2019-07-29 DIAGNOSIS — I890 Lymphedema, not elsewhere classified: Secondary | ICD-10-CM | POA: Diagnosis not present

## 2019-07-29 DIAGNOSIS — I509 Heart failure, unspecified: Secondary | ICD-10-CM | POA: Diagnosis not present

## 2019-07-29 NOTE — Progress Notes (Addendum)
RICCI, PAFF ([id number]) Visit Report for 07/29/2019 Chief Complaint Document Details Patient Name: Caroline [name] Date of Service: 07/29/2019 10:45 AM Medical Record Number: [id number] Patient Account Number: [id number] Date of Birth/Sex: 21-Sep-1941 (77 y.o. F) Treating RN: Harold Barban Primary Care Provider: Threasa Alpha Other Clinician: Referring Provider: Threasa Alpha Treating Provider/Extender: Melburn Hake, HOYT Weeks in Treatment: 3 Information Obtained from: Patient Chief Complaint Weeping leg edema with skin breakdown right leg Electronic Signature(s) Signed: 07/29/2019 10:46:07 AM By: Worthy Keeler PA-C Entered By: Worthy Keeler on 07/29/2019 10:46:06 Caroline [name] ([id number]) -------------------------------------------------------------------------------- HPI Details Patient Name: Caroline [name] Date of Service: 07/29/2019 10:45 AM Medical Record Number: [id number] Patient Account Number: [id number] Date of Birth/Sex: 1941/11/13 (77 y.o. F) Treating RN: Harold Barban Primary Care Provider: Threasa Alpha Other Clinician: Referring Provider: Threasa Alpha Treating Provider/Extender: STONE III, HOYT Weeks in Treatment: 3 History of Present Illness HPI Description: Pleasant 78 year old Caucasian female with history of leg edema, newly enrolled in the congestive heart failure clinic, pulmonary hypertension, chronic kidney disease stage IV, chronic A. fib, oral anticoagulation with Eliquis, presenting with increased leg edema and weeping from both legs, with recent skin tear on right lower extremity anterior leg caused by scratching according to patient. Patient also had a fall about 2 weeks ago and pulled her "groin muscle" after which she has had some difficulty walking and uses a walker. Patient actually presenting to the wound clinic for the right leg skin tear for which she was actually placed on Bactrim for 7 days that she is completing at this  time. Patient is accompanied by her son and both are concerned that there may be infection in the right leg given skin changes they have noticed. 07/15/2019 upon evaluation patient's wound which is actually on the right anterior lower leg appears to be doing better when compared to the prior weeks evaluation. Overall I am pleased that though the wound measurement has not improved to significantly the overall appearance is greatly improved and this appears to be healing quite nicely. The patient is likewise pleased with how things seem to be at this point. I do think that she might benefit from compression therapy. Otherwise I think that the Xeroform gauze dressing has been of benefit for her and I am hopeful that she could continue to benefit from this. 07/22/2019 on evaluation today patient actually appears to be doing okay with regard to her ulcers on the lower extremities. She has been tolerating the dressing changes without complication which is good news. With that being said currently we have been using Xeroform to the areas I think she may benefit from switching to a silver collagen dressing as this was more effective in helping to obtain a skin growth. No fevers, chills, nausea, vomiting, or diarrhea. 07/29/2019 upon evaluation today patient appears to be doing well in regard to her lower extremity ulcers currently. She did have her arterial studies unfortunately she does not have the best flow. Her right is slightly better than the left although she has moderate peripheral vascular disease noted bilaterally with ABIs in the 0.5 to 0.6 range. Obviously this is not ideal. With that being said with the trauma that she sustained to her left lower extremity specifically in the groin area her cardiologist has recommended that she not go for angiogram with Dr. do until things calm down somewhat. Again I completely understand this and feel like that that may be an appropriate way to go. With that being  said as I explained to the patient I do believe that she likely needs to get this addressed at some point especially in light of the fact that Electronic Signature(s) Signed: 07/29/2019 5:59:01 PM By: Worthy Keeler PA-C Entered By: Worthy Keeler on 07/29/2019 17:59:01 Caroline [name] ([id number]) -------------------------------------------------------------------------------- Physical Exam Details Patient Name: Caroline [name] Date of Service: 07/29/2019 10:45 AM Medical Record Number: [id number] Patient Account Number: [id number] Date of Birth/Sex: 1941-06-28 (77 y.o. F) Treating RN: Harold Barban Primary Care Provider: Threasa Alpha Other Clinician: Referring Provider: Threasa Alpha Treating Provider/Extender: STONE III, HOYT Weeks in Treatment: 3 Constitutional Well-nourished and well-hydrated in no acute distress. Respiratory normal breathing without difficulty. clear to auscultation bilaterally. Cardiovascular regular rate and rhythm with normal S1, S2. Psychiatric this patient is able to make decisions and demonstrates good insight into disease process. Alert and Oriented x 3. pleasant and cooperative. Notes Upon inspection today patient's wound bed actually appears to be doing well all things considering. Especially in light of the fact that her arterial studies really do not show that her blood flow is quite as good as we would like to see. Nonetheless she is tolerating the dressing changes including the Tubigrip without complication and overall seems to be healing quite nicely. Electronic Signature(s) Signed: 07/29/2019 6:02:14 PM By: Worthy Keeler PA-C Entered By: Worthy Keeler on 07/29/2019 18:02:14 Caroline [name] ([id number]) -------------------------------------------------------------------------------- Physician Orders Details Patient Name: Caroline [name] Date of Service: 07/29/2019 10:45 AM Medical Record Number: [id number] Patient Account Number:  [id number] Date of Birth/Sex: [birth year] (77 y.o. 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 Wound #2 Left,Medial,Posterior Lower Leg o Foam - To watch for any drainage. To protect the wound Secondary Dressing Wound #1 Right,Anterior Lower Leg o ABD pad Dressing Change Frequency Wound #1 Right,Anterior Lower Leg o Change dressing every other day. Follow-up Appointments Wound #1 Right,Anterior Lower Leg o Return Appointment in 1 week. Edema Control Wound #1 Right,Anterior Lower Leg o Other: - 2-Layer of Tubi-Grip #F Wound #2 Left,Medial,Posterior Lower Leg o Other: - 2-Layer of Tubi-Grip #F Volk, Ronella K. ([id number]) Off-Loading Wound #1 Right,Anterior Lower Leg o Turn and reposition every 2 hours - Elevate legs when sitting Electronic Signature(s) Signed: 07/29/2019 4:11:30 PM By: Harold Barban Signed: 07/29/2019 6:32:30 PM By: Worthy Keeler PA-C Entered By: Harold Barban on 07/29/2019 11:35:55 Caroline [name]  ([id number]) -------------------------------------------------------------------------------- Problem List Details Patient Name: Caroline [name] Date of Service: 07/29/2019 10:45 AM Medical Record Number: [id number] Patient Account Number: [id number] Date of Birth/Sex: August 18, 1941 (77 y.o. F) Treating RN: Harold Barban Primary Care Provider: Threasa Alpha Other Clinician: Referring Provider: Threasa Alpha Treating Provider/Extender: Melburn Hake, HOYT Weeks in Treatment: 3 Active Problems ICD-10 Evaluated Encounter Code Description Active Date Today Diagnosis I87.2 Venous insufficiency (chronic) (peripheral)  07/15/2019 No Yes I89.0 Lymphedema, not elsewhere classified 07/15/2019 No Yes L97.812 Non-pressure chronic ulcer of other part of right lower leg 07/15/2019 No Yes with fat layer exposed I10 Essential (primary) hypertension 07/15/2019 No Yes I48.20 Chronic atrial fibrillation, unspecified 07/15/2019 No Yes N18.4 Chronic kidney disease, stage 4 (severe) 07/15/2019 No Yes Z79.01 Long term (current) use of anticoagulants 07/15/2019 No Yes Inactive Problems Resolved Problems Electronic Signature(s) Signed: 07/29/2019 10:45:58 AM By: Worthy Keeler PA-C Entered By: Worthy Keeler on 07/29/2019 10:45:57 Caroline [name] ([id number]) -------------------------------------------------------------------------------- Progress Note Details Patient Name: Caroline [name] Date of Service: 07/29/2019 10:45 AM Medical Record Number: [id number] Patient Account Number: [id number] Date of Birth/Sex: 17-Dec-1941 (77 y.o. F) Treating RN: Harold Barban Primary Care Provider: Threasa Alpha Other Clinician: Referring Provider: Threasa Alpha Treating Provider/Extender: Melburn Hake, HOYT Weeks in Treatment: 3 Subjective Chief Complaint Information obtained from Patient Weeping leg edema with skin breakdown right leg History of Present Illness (HPI) Pleasant 78 year old Caucasian female with history  of leg edema, newly enrolled in the congestive heart failure clinic, pulmonary hypertension, chronic kidney disease stage IV, chronic A. fib, oral anticoagulation with Eliquis, presenting with increased leg edema and weeping from both legs, with recent skin tear on right lower extremity anterior leg caused by scratching according to patient. Patient also had a fall about 2 weeks ago and pulled her "groin muscle" after which she has had some difficulty walking and uses a walker. Patient actually presenting to the wound clinic for the right leg skin tear for which she was actually placed on Bactrim for 7 days that she is completing at this time. Patient is accompanied by her son and both are concerned that there may be infection in the right leg given skin changes they have noticed. 07/15/2019 upon evaluation patient's wound which is actually on the right anterior lower leg appears to be doing better when compared to the prior weeks evaluation. Overall I am pleased that though the wound measurement has not improved to significantly the overall appearance is greatly improved and this appears to be healing quite nicely. The patient is likewise pleased with how things seem to be at this point. I do think that she might benefit from compression therapy. Otherwise I think that the Xeroform gauze dressing has been of benefit for her and I am hopeful that she could continue to benefit from this. 07/22/2019 on evaluation today patient actually appears to be doing okay with regard to her ulcers on the lower extremities. She has been tolerating the dressing changes without complication which is good news. With that being said currently we have been using Xeroform to the areas I think she may benefit from switching to a silver collagen dressing as this was more effective in helping to obtain a skin growth. No fevers, chills, nausea, vomiting, or diarrhea. 07/29/2019 upon evaluation today patient appears to be doing  well in regard to her lower extremity ulcers currently. She did have her arterial studies unfortunately she does not have the best flow. Her right is slightly better than the left although she has moderate peripheral vascular disease noted bilaterally with ABIs in the 0.5 to 0.6 range. Obviously this is not ideal. With that being said with the trauma that she sustained to her left lower extremity specifically in the groin area her cardiologist has recommended that she not go for angiogram with Dr. do until things calm down somewhat. Again I completely understand this and feel like that that may be an  appropriate way to go. With that being said as I explained to the patient I do believe that she likely needs to get this addressed at some point especially in light of the fact that Patient History Information obtained from Patient. Family History Heart Disease - Father, Hypertension - Father, Lung Disease - Siblings, No family history of Cancer, Diabetes, Hereditary Spherocytosis, Kidney Disease, Seizures, Stroke, Thyroid Problems, Tuberculosis. Social History Never smoker, Marital Status - Married, Alcohol Use - Rarely, Drug Use - No History, Caffeine Use - Daily. Medical History ZYLEE, MARCHIANO ([id number]) Respiratory Denies history of Aspiration, Asthma, Chronic Obstructive Pulmonary Disease (COPD), Pneumothorax, Sleep Apnea, Tuberculosis Cardiovascular Patient has history of Congestive Heart Failure, Coronary Artery Disease, Hypertension Denies history of Angina, Arrhythmia, Deep Vein Thrombosis, Hypotension, Myocardial Infarction, Peripheral Arterial Disease, Peripheral Venous Disease, Phlebitis, Vasculitis Integumentary (Skin) Denies history of History of Burn, History of pressure wounds Musculoskeletal Patient has history of Gout, Osteoarthritis Denies history of Rheumatoid Arthritis, Osteomyelitis Neurologic Denies history of Dementia, Neuropathy, Quadriplegia, Paraplegia,  Seizure Disorder Medical And Surgical History Notes Respiratory Pulmonary HTN Cardiovascular HLD Neurologic TIA 2018 Review of Systems (ROS) Constitutional Symptoms (General Health) Denies complaints or symptoms of Fatigue, Fever, Chills, Marked Weight Change. Respiratory Denies complaints or symptoms of Chronic or frequent coughs, Shortness of Breath. Cardiovascular Complains or has symptoms of LE edema. Denies complaints or symptoms of Chest pain. Psychiatric Denies complaints or symptoms of Anxiety, Claustrophobia. Objective Constitutional Well-nourished and well-hydrated in no acute distress. Vitals Time Taken: 11:01 AM, Height: 63 in, Weight: 155 lbs, BMI: 27.5, Temperature: 97.6 F, Pulse: 63 bpm, Respiratory Rate: 16 breaths/min, Blood Pressure: 129/83 mmHg. Respiratory normal breathing without difficulty. clear to auscultation bilaterally. Cardiovascular regular rate and rhythm with normal S1, S2. Psychiatric this patient is able to make decisions and demonstrates good insight into disease process. Alert and Oriented x 3. pleasant [place], Jalon K. ([id number]) and cooperative. General Notes: Upon inspection today patient's wound bed actually appears to be doing well all things considering. Especially in light of the fact that her arterial studies really do not show that her blood flow is quite as good as we would like to see. Nonetheless she is tolerating the dressing changes including the Tubigrip without complication and overall seems to be healing quite nicely. Integumentary (Hair, Skin) Wound #1 status is Open. Original cause of wound was Trauma. The wound is located on the Right,Anterior Lower Leg. The wound measures 1.3cm length x 1.1cm width x 0.1cm depth; 1.123cm^2 area and 0.112cm^3 volume. There is Fat Layer (Subcutaneous Tissue) Exposed exposed. There is no tunneling or undermining noted. There is a medium amount of serous drainage noted. The wound margin is  flat and intact. There is no granulation within the wound bed. There is a large (67-100%) amount of necrotic tissue within the wound bed including Eschar and Adherent Slough. Wound #2 status is Open. Original cause of wound was Gradually Appeared. The wound is located on the Left,Medial,Posterior Lower Leg. The wound measures 0.1cm length x 0.1cm width x 0.1cm depth; 0.008cm^2 area and 0.001cm^3 volume. The wound is limited to skin breakdown. There is no tunneling or undermining noted. There is a none present amount of drainage noted. The wound margin is flat and intact. There is no granulation within the wound bed. There is a large (67-100%) amount of necrotic tissue within the wound bed including Eschar. Assessment Active Problems ICD-10 Venous insufficiency (chronic) (peripheral) Lymphedema, not elsewhere classified Non-pressure chronic ulcer of other part of right lower  leg with fat layer exposed Essential (primary) hypertension Chronic atrial fibrillation, unspecified Chronic kidney disease, stage 4 (severe) Long term (current) use of anticoagulants Plan Wound Cleansing: Wound #1 Right,Anterior Lower Leg: Dial antibacterial soap, wash wounds, rinse and pat dry prior to dressing wounds Primary Wound Dressing: Wound #1 Right,Anterior Lower Leg: Silver Collagen - Moisten with normal saline and place directly on wound bed. Wound #2 Left,Medial,Posterior Lower Leg: Foam - To watch for any drainage. To protect the wound Secondary Dressing: Wound #1 Right,Anterior Lower Leg: ABD pad Dressing Change Frequency: Wound #1 Right,Anterior Lower Leg: Change dressing every other day. CARLISSA, PESOLA ([id number]) Follow-up Appointments: Wound #1 Right,Anterior Lower Leg: Return Appointment in 1 week. Edema Control: Wound #1 Right,Anterior Lower Leg: Other: - 2-Layer of Tubi-Grip #F Wound #2 Left,Medial,Posterior Lower Leg: Other: - 2-Layer of Tubi-Grip #F Off-Loading: Wound #1  Right,Anterior Lower Leg: Turn and reposition every 2 hours - Elevate legs when sitting 1. At this point my suggestion is going to be that we actually continue with the above wound care measures especially in light of the fact that the patient would prefer to hold off on seeing Dr. Lucky Cowboy at [facility] vein and vascular until her leg heals quite a bit more. Since she is healing okay at this point I think that is okay for [place] to hold off on. If anything changes or worsens she will let me know. 2. With regard to the right anterior lower extremity ulcer I would recommend continuation of silver collagen. 3. In regard to the left medial/posterior leg ulcer we will continue with the protective foam dressing for this week. 4. I am going to suggest as well that we continue with the double layer Tubigrip as this seems to be helping with her fluid without causing any additional issues. The patient is in agreement with that plan. We will see patient back for reevaluation in 1 week here in the clinic. If anything worsens or changes patient will contact our office for additional recommendations. Electronic Signature(s) Signed: 07/29/2019 6:03:43 PM By: Worthy Keeler PA-C Entered By: Worthy Keeler on 07/29/2019 18:03:43 Caroline [name] ([id number]) -------------------------------------------------------------------------------- ROS/PFSH Details Patient Name: Caroline [name] Date of Service: 07/29/2019 10:45 AM Medical Record Number: [id number] Patient Account Number: [id number] Date of Birth/Sex: Jan 09, 1941 (77 y.o. F) Treating RN: Harold Barban Primary Care Provider: Threasa Alpha Other Clinician: Referring Provider: Threasa Alpha Treating Provider/Extender: STONE III, HOYT Weeks in Treatment: 3 Information Obtained From Patient Constitutional Symptoms (General Health) Complaints and Symptoms: Negative for: Fatigue; Fever; Chills; Marked Weight Change Respiratory Complaints and  Symptoms: Negative for: Chronic or frequent coughs; Shortness of Breath Medical History: Negative for: Aspiration; Asthma; Chronic Obstructive Pulmonary Disease (COPD); Pneumothorax; Sleep Apnea; Tuberculosis Past Medical History Notes: Pulmonary HTN Cardiovascular Complaints and Symptoms: Positive for: LE edema Negative for: Chest pain Medical History: Positive for: Congestive Heart Failure; Coronary Artery Disease; Hypertension Negative for: Angina; Arrhythmia; Deep Vein Thrombosis; Hypotension; Myocardial Infarction; Peripheral Arterial Disease; Peripheral Venous Disease; Phlebitis; Vasculitis Past Medical History Notes: HLD Psychiatric Complaints and Symptoms: Negative for: Anxiety; Claustrophobia Integumentary (Skin) Medical History: Negative for: History of Burn; History of pressure wounds Musculoskeletal Medical History: Positive for: Gout; Osteoarthritis Negative for: Rheumatoid Arthritis; Osteomyelitis Neurologic CHANTEA, SURACE. ([id number]) Medical History: Negative for: Dementia; Neuropathy; Quadriplegia; Paraplegia; Seizure Disorder Past Medical History Notes: TIA 2018 Immunizations Pneumococcal Vaccine: Received Pneumococcal Vaccination: Yes Implantable Devices None Family and Social History Cancer: No; Diabetes: No; Heart Disease: Yes - Father; Hereditary Spherocytosis: No;  Hypertension: Yes - Father; Kidney Disease: No; Lung Disease: Yes - Siblings; Seizures: No; Stroke: No; Thyroid Problems: No; Tuberculosis: No; Never smoker; Marital Status - Married; Alcohol Use: Rarely; Drug Use: No History; Caffeine Use: Daily; Financial Concerns: No; Food, Clothing or Shelter Needs: No; Support System Lacking: No; Transportation Concerns: No Physician Affirmation I have reviewed and agree with the above information. Electronic Signature(s) Signed: 07/29/2019 6:32:30 PM By: Worthy Keeler PA-C Signed: 07/30/2019 3:07:27 PM By: Harold Barban Entered By: Worthy Keeler on 07/29/2019 18:01:50 Caroline [name] ([id number]) -------------------------------------------------------------------------------- SuperBill Details Patient Name: Caroline [name] Date of Service: 07/29/2019 Medical Record Number: [id number] Patient Account Number: [id number] Date of Birth/Sex: [birth year] (77 y.o. F) Treating RN: Harold Barban Primary Care Provider: Threasa Alpha Other Clinician: Referring Provider: Threasa Alpha Treating Provider/Extender: STONE III, HOYT Weeks in Treatment: 3 Diagnosis Coding ICD-10 Codes Code Description I87.2 Venous insufficiency (chronic) (peripheral) I89.0 Lymphedema, not elsewhere classified L97.812 Non-pressure chronic ulcer of other part of right lower leg with fat layer exposed [place] (primary) hypertension I48.20 Chronic atrial fibrillation, unspecified N18.4 Chronic kidney disease, stage 4 (severe) Z79.01 Long term (current) use of anticoagulants Facility Procedures CPT4 Code: [id number] Description: 99213 - WOUND CARE VISIT-LEV 3 EST PT Modifier: Quantity: 1 Physician Procedures CPT4 Code Description: [id number] - WC PHYS LEVEL 4 - EST PT ICD-10 Diagnosis Description I87.2 Venous insufficiency (chronic) (peripheral) L97.812 Non-pressure chronic ulcer of other part of right lower leg w I89.0 Lymphedema, not elsewhere  classified I10 Essential (primary) hypertension Modifier: ith fat layer expo Quantity: 1 sed Electronic Signature(s) Signed: 07/29/2019 6:04:10 PM By: Worthy Keeler PA-C Entered By: Worthy Keeler on 07/29/2019 18:04:10

## 2019-07-29 NOTE — Progress Notes (Addendum)
[name], [name] ([id number]) Visit Report for 07/29/2019 Arrival Information Details Patient Name: Caroline [name], Caroline [name] Date of Service: 07/29/2019 10:45 AM Medical Record Number: [id number] Patient Account Number: [id number] Date of Birth/Sex: [birth year] (77 y.o. F) Treating RN: Montey Hora Primary Care Caroline [name]: Threasa Alpha Other Clinician: Referring Caroline [name]: Threasa Alpha Treating Caroline [name]/Extender: Melburn Hake, [name] Weeks in Treatment: 3 Visit Information History Since Last Visit Added or deleted any medications: No Patient Arrived: Walker Any new allergies or adverse reactions: No Arrival Time: 11:00 Had a fall or experienced change in No Accompanied By: spouse activities of daily living that may affect Transfer Assistance: None risk of falls: Patient Identification Verified: Yes Signs or symptoms of abuse/neglect since last visito No Secondary Verification Process Yes Hospitalized since last visit: No Completed: Implantable device outside of the clinic excluding No Patient Has Alerts: Yes cellular tissue based products placed in the center Patient Alerts: Patient on Blood since last visit: Thinner Has Dressing in Place as Prescribed: Yes Eliquis Has Compression in Place as Prescribed: Yes ABI L-0.65 R-0.58 07/23/19 Pain Present Now: No TBI L-0-42 R-0.58 07/23/19 Electronic Signature(s) Signed: 07/29/2019 11:52:17 AM By: Harold Barban Previous Signature: 07/29/2019 11:47:28 AM Version By: Harold Barban Entered By: Harold Barban on 07/29/2019 11:52:17 Caroline [name] ([id number]) -------------------------------------------------------------------------------- Clinic Level of Care Assessment Details Patient Name: Caroline [name] Date of Service: 07/29/2019 10:45 AM Medical Record Number: [id number] Patient Account Number: [id number] Date of Birth/Sex: 05-Aug-1941 (77 y.o. F) Treating RN: Harold Barban Primary Care Caroline [name]: Threasa Alpha Other  Clinician: Referring Caroline [name]: Threasa Alpha Treating Caroline [name]/Extender: Caroline [name] Weeks in Treatment: 3 Clinic Level of Care Assessment Items TOOL 4 Quantity Score []  - Use when only an EandM is performed on FOLLOW-UP visit 0 ASSESSMENTS - Nursing Assessment / Reassessment X - Reassessment of Co-morbidities (includes updates in patient status) 1 10 X- 1 5 Reassessment of Adherence to Treatment Plan ASSESSMENTS - Wound and Skin Assessment / Reassessment []  - Simple Wound Assessment / Reassessment - one wound 0 X- 2 5 Complex Wound Assessment / Reassessment - multiple wounds []  - 0 Dermatologic / Skin Assessment (not related to wound area) ASSESSMENTS - Focused Assessment []  - Circumferential Edema Measurements - multi extremities 0 []  - 0 Nutritional Assessment / Counseling / Intervention []  - 0 Lower Extremity Assessment (monofilament, tuning fork, pulses) []  - 0 Peripheral Arterial Disease Assessment (using hand held doppler) ASSESSMENTS - Ostomy and/or Continence Assessment and Care []  - Incontinence Assessment and Management 0 []  - 0 Ostomy Care Assessment and Management (repouching, etc.) PROCESS - Coordination of Care X - Simple Patient / Family Education for ongoing care 1 15 []  - 0 Complex (extensive) Patient / Family Education for ongoing care []  - 0 Staff obtains Programmer, systems, Records, Test Results / Process Orders []  - 0 Staff telephones HHA, Nursing Homes / Clarify orders / etc []  - 0 Routine Transfer to another Facility (non-emergent condition) []  - 0 Routine Hospital Admission (non-emergent condition) []  - 0 New Admissions / Biomedical engineer / Ordering NPWT, Apligraf, etc. []  - 0 Emergency Hospital Admission (emergent condition) X- 1 10 Simple Discharge Coordination Caroline [name], MERGEN. ([id number]) []  - 0 Complex (extensive) Discharge Coordination PROCESS - Special Needs []  - Pediatric / Minor Patient Management 0 []  - 0 Isolation Patient  Management []  - 0 Hearing / Language / Visual special needs []  - 0 Assessment of Community assistance (transportation, D/C planning, etc.) []  - 0 Additional assistance / Altered mentation []  - 0 Support Surface(s) Assessment (  bed, cushion, seat, etc.) INTERVENTIONS - Wound Cleansing / Measurement []  - Simple Wound Cleansing - one wound 0 X- 2 5 Complex Wound Cleansing - multiple wounds X- 1 5 Wound Imaging (photographs - any number of wounds) []  - 0 Wound Tracing (instead of photographs) []  - 0 Simple Wound Measurement - one wound X- 2 5 Complex Wound Measurement - multiple wounds INTERVENTIONS - Wound Dressings X - Small Wound Dressing one or multiple wounds 2 10 []  - 0 Medium Wound Dressing one or multiple wounds []  - 0 Large Wound Dressing one or multiple wounds []  - 0 Application of Medications - topical []  - 0 Application of Medications - injection INTERVENTIONS - Miscellaneous []  - External ear exam 0 []  - 0 Specimen Collection (cultures, biopsies, blood, body fluids, etc.) []  - 0 Specimen(s) / Culture(s) sent or taken to Lab for analysis []  - 0 Patient Transfer (multiple staff / Civil Service fast streamer / Similar devices) []  - 0 Simple Staple / Suture removal (25 or less) []  - 0 Complex Staple / Suture removal (26 or more) []  - 0 Hypo / Hyperglycemic Management (close monitor of Blood Glucose) []  - 0 Ankle / Brachial Index (ABI) - do not check if billed separately X- 1 5 Vital Signs [name], [name] ([id number]) Has the patient been seen at the hospital within the last three years: Yes Total Score: 100 Level Of Care: New/Established - Level 3 Electronic Signature(s) Signed: 07/29/2019 4:11:30 PM By: Harold Barban Entered By: Harold Barban on 07/29/2019 11:37:26 Caroline [name] ([id number]) -------------------------------------------------------------------------------- Encounter Discharge Information Details Patient Name: Caroline [name] Date of Service:  07/29/2019 10:45 AM Medical Record Number: [id number] Patient Account Number: [id number] Date of Birth/Sex: 07/06/1941 (77 y.o. F) Treating RN: Army Melia Primary Care Stepan Verrette: Threasa Alpha Other Clinician: Referring Myalynn Lingle: Threasa Alpha Treating Frenchie Pribyl/Extender: Melburn Hake, [name] Weeks in Treatment: 3 Encounter Discharge Information Items Discharge Condition: Stable Ambulatory Status: Walker Discharge Destination: Home Transportation: Private Auto Accompanied By: husband Schedule Follow-up Appointment: Yes Clinical Summary of Care: Electronic Signature(s) Signed: 07/29/2019 1:41:30 PM By: Army Melia Entered By: Army Melia on 07/29/2019 11:49:58 Caroline [name] ([id number]) -------------------------------------------------------------------------------- Lower Extremity Assessment Details Patient Name: Caroline [name] Date of Service: 07/29/2019 10:45 AM Medical Record Number: [id number] Patient Account Number: [id number] Date of Birth/Sex: 1941/10/01 (77 y.o. F) Treating RN: Montey Hora Primary Care Eneida Evers: Threasa Alpha Other Clinician: Referring Lemon Sternberg: Threasa Alpha Treating Irma Delancey/Extender: Caroline [name] Weeks in Treatment: 3 Edema Assessment Assessed: [Left: No] [Right: No] Edema: [Left: Yes] [Right: Yes] Vascular Assessment Pulses: Dorsalis Pedis Palpable: [Left:Yes] [Right:Yes] Electronic Signature(s) Signed: 07/29/2019 3:29:41 PM By: Montey Hora Entered By: Montey Hora on 07/29/2019 11:09:11 Caroline [name] ([id number]) -------------------------------------------------------------------------------- Multi Wound Chart Details Patient Name: Caroline [name] Date of Service: 07/29/2019 10:45 AM Medical Record Number: [id number] Patient Account Number: [id number] Date of Birth/Sex: 10-21-1941 (77 y.o. F) Treating RN: Harold Barban Primary Care Chantrell Apsey: Threasa Alpha Other Clinician: Referring Jazzmyn Filion: Threasa Alpha Treating  Evellyn Tuff/Extender: Caroline [name] Weeks in Treatment: 3 Vital Signs Height(in): 63 Pulse(bpm): 63 Weight(lbs): 155 Blood Pressure(mmHg): 129/83 Body Mass Index(BMI): 27 Temperature(F): 97.6 Respiratory Rate 16 (breaths/min): Photos: [1:No Photos] [2:No Photos] [N/A:N/A] Wound Location: [1:Right Lower Leg - Anterior] [2:Left Lower Leg - Medial, Posterior] [N/A:N/A] Wounding Event: [1:Trauma] [2:Gradually Appeared] [N/A:N/A] Primary Etiology: [1:Venous Leg Ulcer] [2:Venous Leg Ulcer] [N/A:N/A] Comorbid History: [1:Congestive Heart Failure, Coronary Artery Disease, Hypertension, Gout, Osteoarthritis] [2:Congestive Heart Failure, Coronary Artery Disease, Hypertension, Gout, Osteoarthritis] [N/A:N/A] Date Acquired: [1:06/24/2019] [2:07/22/2019] [N/A:N/A]  Weeks of Treatment: [1:3] [2:1] [N/A:N/A] Wound Status: [1:Open] [2:Open] [N/A:N/A] Measurements L x W x D [1:1.3x1.1x0.1] [2:0.1x0.1x0.1] [N/A:N/A] (cm) Area (cm) : [1:1.123] [2:0.008] [N/A:N/A] Volume (cm) : [1:0.112] [2:0.001] [N/A:N/A] % Reduction in Area: [1:74.90%] [2:93.70%] [N/A:N/A] % Reduction in Volume: [1:75.00%] [2:92.30%] [N/A:N/A] Classification: [1:Full Thickness Without Exposed Support Structures] [2:Full Thickness Without Exposed Support Structures] [N/A:N/A] Exudate Amount: [1:Medium] [2:None Present] [N/A:N/A] Exudate Type: [1:Serous] [2:N/A] [N/A:N/A] Exudate Color: [1:amber] [2:N/A] [N/A:N/A] Wound Margin: [1:Flat and Intact] [2:Flat and Intact] [N/A:N/A] Granulation Amount: [1:None Present (0%)] [2:None Present (0%)] [N/A:N/A] Necrotic Amount: [1:Large (67-100%)] [2:Large (67-100%)] [N/A:N/A] Necrotic Tissue: [1:Eschar, Adherent Slough] [2:Eschar] [N/A:N/A] Exposed Structures: [1:Fat Layer (Subcutaneous Tissue) Exposed: Yes Fascia: No Tendon: No Muscle: No Joint: No Bone: No] [2:Fascia: No Fat Layer (Subcutaneous Tissue) Exposed: No Tendon: No Muscle: No Joint: No Bone: No Limited to Skin Breakdown]  [N/A:N/A] Epithelialization: [1:Small (1-33%)] [2:None] [N/A:N/A] Treatment Notes Electronic Signature(s) Signed: 07/29/2019 4:11:30 PM By: Harold Barban Entered By: Harold Barban on 07/29/2019 11:28:04 Caroline [name] ([id number]) -------------------------------------------------------------------------------- [place] Details Patient Name: Caroline [name] Date of Service: 07/29/2019 10:45 AM Medical Record Number: [id number] Patient Account Number: [id number] Date of Birth/Sex: August 25, 1941 (77 y.o. F) Treating RN: Harold Barban Primary Care Saladin Petrelli: Threasa Alpha Other Clinician: Referring Phillis Thackeray: Threasa Alpha Treating Japhet Morgenthaler/Extender: Melburn Hake, [name] Weeks in Treatment: 3 Active Inactive Electronic Signature(s) Signed: 08/19/2019 2:16:59 PM By: Gretta Cool, BSN, RN, CWS, Kim RN, BSN Signed: 08/23/2019 1:06:48 PM By: Harold Barban Previous Signature: 07/29/2019 4:11:30 PM Version By: Harold Barban Entered By: Gretta Cool BSN, RN, CWS, Kim on 08/19/2019 14:16:58 Caroline [name] ([id number]) -------------------------------------------------------------------------------- Pain Assessment Details Patient Name: Caroline [name] Date of Service: 07/29/2019 10:45 AM Medical Record Number: [id number] Patient Account Number: [id number] Date of Birth/Sex: 1941-08-23 (77 y.o. F) Treating RN: Montey Hora Primary Care Vonetta Foulk: Threasa Alpha Other Clinician: Referring Mckenzye Cutright: Threasa Alpha Treating Tinley Rought/Extender: Caroline [name] Weeks in Treatment: 3 Active Problems Location of Pain Severity and Description of Pain Patient Has Paino No Site Locations Pain Management and Medication Current Pain Management: Electronic Signature(s) Signed: 07/29/2019 3:29:41 PM By: Montey Hora Entered By: Montey Hora on 07/29/2019 11:01:19 Caroline [name]  ([id number]) -------------------------------------------------------------------------------- Patient/Caregiver Education Details Patient Name: Caroline [name] Date of Service: 07/29/2019 10:45 AM Medical Record Number: [id number] Patient Account Number: [id number] Date of Birth/Gender: 1941-05-01 (77 y.o. F) Treating RN: Harold Barban Primary Care Physician: Threasa Alpha Other Clinician: Referring Physician: Threasa Alpha Treating Physician/Extender: Sharalyn Ink in Treatment: 3 Education Assessment Education Provided To: Patient Education Topics Provided Wound/Skin Impairment: Handouts: Caring for Your Ulcer Methods: Demonstration, Explain/Verbal Responses: State content correctly Electronic Signature(s) Signed: 07/29/2019 4:11:30 PM By: Harold Barban Entered By: Harold Barban on 07/29/2019 11:28:35 Caroline [name] ([id number]) -------------------------------------------------------------------------------- Wound Assessment Details Patient Name: Caroline [name] Date of Service: 07/29/2019 10:45 AM Medical Record Number: [id number] Patient Account Number: [id number] Date of Birth/Sex: 08-20-1941 (77 y.o. F) Treating RN: Montey Hora Primary Care Safa Derner: Threasa Alpha Other Clinician: Referring Imer Foxworth: Threasa Alpha Treating Kensleigh Gates/Extender: Caroline [name] Weeks in Treatment: 3 Wound Status Wound Number: 1 Primary Venous Leg Ulcer Etiology: Wound Location: Right Lower Leg - Anterior Wound Open Wounding Event: Trauma Status: Date Acquired: 06/24/2019 Comorbid Congestive Heart Failure, Coronary Artery Weeks Of Treatment: 3 History: Disease, Hypertension, Gout, Osteoarthritis Clustered Wound: No Photos Photo Uploaded By: Montey Hora on 07/29/2019 13:02:44 Wound Measurements Length: (cm) 1.3 Width: (cm) 1.1 Depth: (cm) 0.1 Area: (cm) 1.123 Volume: (cm) 0.112 % Reduction in Area:  74.9% % Reduction in Volume: 75% Epithelialization:  Small (1-33%) Tunneling: No Undermining: No Wound Description Full Thickness Without Exposed Support Classification: Structures Wound Margin: Flat and Intact Exudate Medium Amount: Exudate Type: Serous Exudate Color: amber Foul Odor After Cleansing: No Slough/Fibrino Yes Wound Bed Granulation Amount: None Present (0%) Exposed Structure Necrotic Amount: Large (67-100%) Fascia Exposed: No Necrotic Quality: Eschar, Adherent Slough Fat Layer (Subcutaneous Tissue) Exposed: Yes Tendon Exposed: No Muscle Exposed: No Joint Exposed: No Bone Exposed: No AKHILA, MAHNKEN ([id number]) Electronic Signature(s) Signed: 07/29/2019 3:29:41 PM By: Montey Hora Entered By: Montey Hora on 07/29/2019 11:10:57 Caroline [name] ([id number]) -------------------------------------------------------------------------------- Wound Assessment Details Patient Name: Caroline [name] Date of Service: 07/29/2019 10:45 AM Medical Record Number: [id number] Patient Account Number: [id number] Date of Birth/Sex: 19-Nov-1941 (77 y.o. F) Treating RN: Montey Hora Primary Care Nandita Mathenia: Threasa Alpha Other Clinician: Referring Edmonia Gonser: Threasa Alpha Treating Juanantonio Stolar/Extender: Caroline [name] Weeks in Treatment: 3 Wound Status Wound Number: 2 Primary Venous Leg Ulcer Etiology: Wound Location: Left Lower Leg - Medial, Posterior Wound Open Wounding Event: Gradually Appeared Status: Date Acquired: 07/22/2019 Comorbid Congestive Heart Failure, Coronary Artery Weeks Of Treatment: 1 History: Disease, Hypertension, Gout, Osteoarthritis Clustered Wound: No Photos Photo Uploaded By: Montey Hora on 07/29/2019 13:02:45 Wound Measurements Length: (cm) 0.1 Width: (cm) 0.1 Depth: (cm) 0.1 Area: (cm) 0.008 Volume: (cm) 0.001 % Reduction in Area: 93.7% % Reduction in Volume: 92.3% Epithelialization: None Tunneling: No Undermining: No Wound Description Full Thickness Without Exposed  Support Classification: Structures Wound Margin: Flat and Intact Exudate None Present Amount: Foul Odor After Cleansing: No Slough/Fibrino No Wound Bed Granulation Amount: None Present (0%) Exposed Structure Necrotic Amount: Large (67-100%) Fascia Exposed: No Necrotic Quality: Eschar Fat Layer (Subcutaneous Tissue) Exposed: No Tendon Exposed: No Muscle Exposed: No Joint Exposed: No Bone Exposed: No Limited to Skin Breakdown EMMALI, KAROW ([id number]) Electronic Signature(s) Signed: 07/29/2019 3:29:41 PM By: Montey Hora Entered By: Montey Hora on 07/29/2019 11:11:28 Caroline [name] ([id number]) -------------------------------------------------------------------------------- [place] Details Patient Name: Caroline [name] Date of Service: 07/29/2019 10:45 AM Medical Record Number: [id number] Patient Account Number: [id number] Date of Birth/Sex: 05-Jun-1941 (77 y.o. F) Treating RN: Montey Hora Primary Care Safari Cinque: Threasa Alpha Other Clinician: Referring Birda Didonato: Threasa Alpha Treating Talonda Artist/Extender: Caroline [name] Weeks in Treatment: 3 Vital Signs Time Taken: 11:01 Temperature (F): 97.6 Height (in): 63 Pulse (bpm): 63 Weight (lbs): 155 Respiratory Rate (breaths/min): 16 Body Mass Index (BMI): 27.5 Blood Pressure (mmHg): 129/83 Reference Range: 80 - 120 mg / dl Electronic Signature(s) Signed: 07/29/2019 3:29:41 PM By: Montey Hora Entered By: Montey Hora on 07/29/2019 11:04:36

## 2019-07-30 ENCOUNTER — Encounter (HOSPITAL_COMMUNITY): Payer: Self-pay | Admitting: Cardiology

## 2019-07-30 ENCOUNTER — Ambulatory Visit (HOSPITAL_COMMUNITY)
Admission: RE | Admit: 2019-07-30 | Discharge: 2019-07-30 | Disposition: A | Discharge status: Home / Self Care | Payer: Medicare Other | Source: Ambulatory Visit | Attending: Cardiology | Admitting: Cardiology

## 2019-07-30 ENCOUNTER — Encounter (HOSPITAL_COMMUNITY): Payer: Self-pay

## 2019-07-30 ENCOUNTER — Other Ambulatory Visit
Admission: RE | Admit: 2019-07-30 | Discharge: 2019-07-30 | Disposition: A | Discharge status: Home / Self Care | Payer: Medicare Other | Source: Ambulatory Visit | Attending: Cardiology | Admitting: Cardiology

## 2019-07-30 VITALS — BP 126/64 | HR 65 | Wt 164.6 lb

## 2019-07-30 DIAGNOSIS — I5033 Acute on chronic diastolic (congestive) heart failure: Secondary | ICD-10-CM | POA: Diagnosis not present

## 2019-07-30 DIAGNOSIS — Z7901 Long term (current) use of anticoagulants: Secondary | ICD-10-CM | POA: Insufficient documentation

## 2019-07-30 DIAGNOSIS — E785 Hyperlipidemia, unspecified: Secondary | ICD-10-CM | POA: Diagnosis not present

## 2019-07-30 DIAGNOSIS — Z8249 Family history of ischemic heart disease and other diseases of the circulatory system: Secondary | ICD-10-CM | POA: Insufficient documentation

## 2019-07-30 DIAGNOSIS — I480 Paroxysmal atrial fibrillation: Secondary | ICD-10-CM | POA: Diagnosis not present

## 2019-07-30 DIAGNOSIS — Z20828 Contact with and (suspected) exposure to other viral communicable diseases: Secondary | ICD-10-CM | POA: Diagnosis not present

## 2019-07-30 DIAGNOSIS — E859 Amyloidosis, unspecified: Secondary | ICD-10-CM | POA: Diagnosis not present

## 2019-07-30 DIAGNOSIS — I739 Peripheral vascular disease, unspecified: Secondary | ICD-10-CM | POA: Insufficient documentation

## 2019-07-30 DIAGNOSIS — I130 Hypertensive heart and chronic kidney disease with heart failure and stage 1 through stage 4 chronic kidney disease, or unspecified chronic kidney disease: Secondary | ICD-10-CM | POA: Diagnosis not present

## 2019-07-30 DIAGNOSIS — Z8673 Personal history of transient ischemic attack (TIA), and cerebral infarction without residual deficits: Secondary | ICD-10-CM | POA: Diagnosis not present

## 2019-07-30 DIAGNOSIS — I2510 Atherosclerotic heart disease of native coronary artery without angina pectoris: Secondary | ICD-10-CM | POA: Diagnosis not present

## 2019-07-30 DIAGNOSIS — Z79890 Hormone replacement therapy (postmenopausal): Secondary | ICD-10-CM | POA: Diagnosis not present

## 2019-07-30 DIAGNOSIS — I5032 Chronic diastolic (congestive) heart failure: Secondary | ICD-10-CM

## 2019-07-30 DIAGNOSIS — N183 Chronic kidney disease, stage 3 (moderate): Secondary | ICD-10-CM | POA: Insufficient documentation

## 2019-07-30 DIAGNOSIS — I872 Venous insufficiency (chronic) (peripheral): Secondary | ICD-10-CM | POA: Insufficient documentation

## 2019-07-30 DIAGNOSIS — Z96652 Presence of left artificial knee joint: Secondary | ICD-10-CM | POA: Insufficient documentation

## 2019-07-30 DIAGNOSIS — Z79899 Other long term (current) drug therapy: Secondary | ICD-10-CM | POA: Insufficient documentation

## 2019-07-30 DIAGNOSIS — E039 Hypothyroidism, unspecified: Secondary | ICD-10-CM | POA: Insufficient documentation

## 2019-07-30 DIAGNOSIS — Z01812 Encounter for preprocedural laboratory examination: Secondary | ICD-10-CM | POA: Diagnosis present

## 2019-07-30 DIAGNOSIS — I050 Rheumatic mitral stenosis: Secondary | ICD-10-CM | POA: Diagnosis not present

## 2019-07-30 DIAGNOSIS — I272 Pulmonary hypertension, unspecified: Secondary | ICD-10-CM

## 2019-07-30 LAB — BASIC METABOLIC PANEL
Anion gap: 13 (ref 5–15)
BUN: 35 mg/dL — ABNORMAL HIGH (ref 8–23)
CO2: 20 mmol/L — ABNORMAL LOW (ref 22–32)
Calcium: 9.3 mg/dL (ref 8.9–10.3)
Chloride: 105 mmol/L (ref 98–111)
Creatinine, Ser: 1.43 mg/dL — ABNORMAL HIGH (ref 0.44–1.00)
GFR calc Af Amer: 41 mL/min — ABNORMAL LOW (ref >60)
GFR calc non Af Amer: 35 mL/min — ABNORMAL LOW (ref >60)
Glucose, Bld: 87 mg/dL (ref 70–99)
Potassium: 3.7 mmol/L (ref 3.5–5.1)
Sodium: 138 mmol/L (ref 135–145)

## 2019-07-30 LAB — CBC
HCT: 47.4 % — ABNORMAL HIGH (ref 36.0–46.0)
Hemoglobin: 14.5 g/dL (ref 12.0–15.0)
MCH: 31.2 pg (ref 26.0–34.0)
MCHC: 30.6 g/dL (ref 30.0–36.0)
MCV: 101.9 fL — ABNORMAL HIGH (ref 80.0–100.0)
Platelets: 259 10*3/uL (ref 150–400)
RBC: 4.65 MIL/uL (ref 3.87–5.11)
RDW: 16.7 % — ABNORMAL HIGH (ref 11.5–15.5)
WBC: 11.6 10*3/uL — ABNORMAL HIGH (ref 4.0–10.5)
nRBC: 0 % (ref 0.0–0.2)

## 2019-07-30 LAB — LIPID PANEL
Cholesterol: 206 mg/dL — ABNORMAL HIGH (ref 0–200)
HDL: 54 mg/dL (ref >40)
LDL Cholesterol: 134 mg/dL — ABNORMAL HIGH (ref 0–99)
Total CHOL/HDL Ratio: 3.8 RATIO
Triglycerides: 92 mg/dL (ref <150)
VLDL: 18 mg/dL (ref 0–40)

## 2019-07-30 LAB — PROTIME-INR
INR: 1.5 — ABNORMAL HIGH (ref 0.8–1.2)
Prothrombin Time: 18 seconds — ABNORMAL HIGH (ref 11.4–15.2)

## 2019-07-30 LAB — BRAIN NATRIURETIC PEPTIDE: B Natriuretic Peptide: 2543.3 pg/mL — ABNORMAL HIGH (ref 0.0–100.0)

## 2019-07-30 MED ORDER — SODIUM CHLORIDE 0.9% FLUSH: 3.0000 mL | Freq: Two times a day (BID) | INTRAVENOUS | Status: DC

## 2019-07-30 MED ORDER — FUROSEMIDE 40 MG PO TABS: ORAL_TABLET | ORAL | 3 refills | Status: DC

## 2019-07-30 NOTE — Progress Notes (Unsigned)
ReDS Vest - 07/30/19 1200      ReDS Vest   MR   Moderate    Estimated volume prior to reading  Med    Fitting Posture  Sitting    Height Marker  Short    Ruler Value  28    Center Strip  Aligned    ReDS Value  31    Anatomical Comments  station A

## 2019-07-30 NOTE — Patient Instructions (Signed)
Labs were done today. We will call you with any ABNORMAL results. No news is good news!  Please repeat labs in approximately 10 days.   EKG was completed.   ReDs clip was completed and was 31%.  INCREASE Lasix to 40 mg TWICE A DAY for 4 DAYS ONLY.   Then begin taking Lasix 40 mg (2 tabs) in the AM and 20 mg (1 tab) in the PM.  You have been ordered a PYP Scan.  This is done at [facility] at [place], they will call you to schedule.  When you come for this test please plan to be there 2-3 hours.  They are located at: [address], [place], [place] [phone]  You will need to complete a COVID-19 screen prior to your procedure. This can be completed at our drive through [facility].   Your procedures will be 7 August, you will need to check into Short Stay at the main Entrance of [facility] at 11:30 am.  PLEASE HOLD [place].   Your physician recommends that you schedule a follow-up appointment in: 3 weeks.  At the [facility], you and your health needs are our priority. As part of our continuing mission to provide you with exceptional heart care, we have created designated Provider Care Teams. These Care Teams include your primary Cardiologist (physician) and Advanced Practice Providers (APPs- Physician Assistants and Nurse Practitioners) who all work together to provide you with the care you need, when you need it.   You may see any of the following providers on your designated Care Team at your next follow up: Marland Kitchen Dr Glori Bickers . Dr Loralie Champagne . Darrick Grinder, NP   Please be sure to bring in all your medications bottles to every appointment.

## 2019-07-30 NOTE — H&P (View-Only) (Signed)
PCP: Remi Haggard, FNP Cardiology: Dr. [name] HF Cardiology: Loralie Champagne   78 y.o. with history of chronic diastolic CHF, mitral stenosis, paroxysmal atrial fibrillation, CKD stage 3, and PAD was referred by Dr. [name] for evaluation of CHF.  Patient reports exertional dyspnea since 2018 (after left TKR complicated by respiratory failure).  Dyspnea has been gradually worsening over time, currently she is short of breath walking 100-150 feet and with most moderate exertion.  No orthopnea/PND.  No chest pain.  Last echo in 1/20 showed normal LV EF with mild LVH, normal RV with moderate pulmonary hypertension (PASP 60 mmHg), and mild to moderate mitral stenosis.  No known history of rheumatic fever. She has paroxysmal atrial fibrillation but is in NSR today. She has home oxygen which she only uses prn.  She never smoked. Recently noted to have moderate PAD by ABIs.  She denies claudication but has leg fatigue with exertion.  She had had what in the past has been thought to be venous stasis ulcers.  She has ecchymosis on her left calf after mechanical fall (no lightheadedness or syncope).   REDS clip 31%  Labs (9/19): K 3.8, creatinine 1.73  ECG (personally reviewed): NSR, short SVT run  PMH: 1. PAD: ABIs (7/20) 0.53 on right and 0.47 on left.  2. Chronic diastolic CHF: Echo (8/54) with EF 60-65%, mild LVH, normal RV size and systolic function, PA systolic pressure 60 mmHg, mild MR, mild-moderate mitral stenosis (mean gradient 6.5 mmHg), moderate LAE.  3. Atrial fibrillation: Paroxysmal.  S/p CVA.  4. HTN 5. Chronic venous insufficiency.  6. Hypothyroidism.  7. Hyperlipidemia 8. Bilateral carotid artery disease: Followed by vascular surgeon in [place].  9. CKD: Stage 3.  10. 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  11. Left TKR in 2018.  12. Mitral stenosis: Mild to moderate on echo in 1/20.   Social History   Socioeconomic History  . Marital status: Married    Spouse name: Not on  file  . Number of children: Not on file  . Years of education: Not on file  . Highest education level: Not on file  Occupational History  . Occupation: retired  Scientific laboratory technician  . Financial resource strain: Not on file  . Food insecurity    Worry: Not on file    Inability: Not on file  . Transportation needs    Medical: Not on file    Non-medical: Not on file  Tobacco Use  . Smoking status: Never Smoker  . Smokeless tobacco: Never Used  Substance and Sexual Activity  . Alcohol use: Yes    Alcohol/week: 1.0 standard drinks    Types: 1 Glasses of wine per week    Comment: social   . Drug use: No  . Sexual activity: Not on file  Lifestyle  . Physical activity    Days per week: Not on file    Minutes per session: Not on file  . Stress: Not on file  Relationships  . Social Herbalist on phone: Not on file    Gets together: Not on file    Attends religious service: Not on file    Active member of club or organization: Not on file    Attends meetings of clubs or organizations: Not on file    Relationship status: Not on file  . Intimate partner violence    Fear of current or ex partner: Not on file    Emotionally abused: Not on file  Physically abused: Not on file    Forced sexual activity: Not on file  Other Topics Concern  . Not on file  Social History Narrative  . Not on file   Family History  Problem Relation Age of Onset  . Heart attack Father 17  . Hypertension Father   . Hyperlipidemia Father   . Heart disease Brother        valvular disease    ROS: All systems reviewed and negative except as per HPI.   Current Outpatient Medications  Medication Sig Dispense Refill  . acetaminophen (TYLENOL) 500 MG tablet Take 1,000 mg by mouth daily as needed (for pain.).    Marland Kitchen allopurinol (ZYLOPRIM) 100 MG tablet Take 100 mg by mouth daily.     . cyanocobalamin (,VITAMIN B-12,) 1000 MCG/ML injection Inject 1,000 mcg into the muscle every 30 (thirty) days.    Marland Kitchen  ELIQUIS 5 MG TABS tablet Take 1 tablet by mouth twice daily 60 tablet 5  . furosemide (LASIX) 40 MG tablet Take 1 tablet (40 mg total) by mouth every morning AND 0.5 tablets (20 mg total) at bedtime. 90 tablet 3  . levothyroxine (SYNTHROID, LEVOTHROID) 112 MCG tablet Take 112 mcg by mouth daily before breakfast.    . lisinopril (PRINIVIL,ZESTRIL) 40 MG tablet Take 1 tablet (40 mg total) by mouth daily. 90 tablet 3  . metoprolol tartrate (LOPRESSOR) 50 MG tablet Take 1 tablet (50 mg total) by mouth 2 (two) times daily. 180 tablet 3  . simvastatin (ZOCOR) 20 MG tablet Take 20 mg by mouth daily.     Current Facility-Administered Medications  Medication Dose Route Frequency Provider Last Rate Last Dose  . sodium chloride flush (NS) 0.9 % injection 3 mL  3 mL Intravenous Q12H Larey Dresser, MD       Facility-Administered Medications Ordered in Other Encounters  Medication Dose Route Frequency Provider Last Rate Last Dose  . albuterol (PROVENTIL) (2.5 MG/3ML) 0.083% nebulizer solution 2.5 mg  2.5 mg Nebulization Once Laverle Hobby, MD       BP 126/64   Pulse 65   Wt 74.7 kg (164 lb 9.6 oz)   SpO2 99%   BMI 29.16 kg/m  General: NAD Neck: JVP 9-10 cm with HJR, no thyromegaly or thyroid nodule.  Lungs: Clear to auscultation bilaterally with normal respiratory effort. CV: Nondisplaced PMI.  Heart regular S1/S2, no S3/S4, 2/6 HSM LLSB.  1+ ankle edema.  No carotid bruit.  Difficult to palpate pedal pulses.  Abdomen: Soft, nontender, no hepatosplenomegaly, no distention.  Skin: Intact without lesions or rashes.  Neurologic: Alert and oriented x 3.  Psych: Normal affect. Extremities: No clubbing or cyanosis.  HEENT: Normal.   Assessment/Plan: 1. Chronic diastolic CHF: Echo in 6/78 showed EF 60-65%, mild LVH, normal-appearing RV with PASP 60 mmHg, mild-moderate mitral stenosis. On exam today, she is volume overloaded.  Though surprisingly, her REDS clip reading was only 31%, her BNP was  2543.  I think that the REDS clip is inaccurate and that she has fluid overload.  - Increase Lasix to 40 mg bid x 4 days then 40 qam/20 qpm.  BMET today and again in 10 days.  - With LVH and prominent CHF, I think that it would be reasonable to work her up for cardiac amyloidosis.  I will arrange for a PYP scan and send off myeloma panel, serum free light chains, and urine immunofixation today.  - I look over her last echo, cannot rule out mitral stenosis  as playing a role in her CHF (and pulmonary hypertension).  I will arrange for TEE to assess the mitral valve more closely. We discussed risks/benefits and she agrees to procedure.  - On the day of TEE, I will also do a right heart cath and left heart cath for pressures only.  This will allow me to assess her filling pressures, assess pulmonary hypertension, and assess for mitral stenosis as well. She will hold Eliquis the day before and the day of procedure.  We discussed risks/benefits and she agrees to procedure.  2. Pulmonary hypertension: Moderate pulmonary hypertension on last echo.  This may be group 2 PH (pulmonary venous hypertension) due to elevated left atrial pressure (from diastolic CHF versus mitral stenosis).  However, I would like to rule out pulmonary arterial hypertension (?group 1), so will plan RHC as above.  3. Mitral valve disease: I reviewed the last echo.  Mitral stenosis is at least mild, cannot rule out moderate.  No history of rheumatic fever but valve is calcified/thickened and may be rheumatic.   - I will plan close evaluation with RHC/LHC and TEE as above.  4. Atrial fibrillation: Paroxysmal.   - Continue Eliquis - Continue metoprolol.  5. PAD: No claudication but legs fatigue easily. Recent ABIs were moderately decreased.  She will be seeing vascular surgery in [place].  6. Hyperlipidemia: Goal LDL < 70 with vascular disease.  - Check lipids today.  7. CKD: Stage 3.  BMET today.   Loralie Champagne 07/30/2019

## 2019-07-30 NOTE — Progress Notes (Signed)
PCP: Remi Haggard, FNP Cardiology: Dr. [name] HF Cardiology: Loralie Champagne   78 y.o. with history of chronic diastolic CHF, mitral stenosis, paroxysmal atrial fibrillation, CKD stage 3, and PAD was referred by Dr. [name] for evaluation of CHF.  Patient reports exertional dyspnea since 2018 (after left TKR complicated by respiratory failure).  Dyspnea has been gradually worsening over time, currently she is short of breath walking 100-150 feet and with most moderate exertion.  No orthopnea/PND.  No chest pain.  Last echo in 1/20 showed normal LV EF with mild LVH, normal RV with moderate pulmonary hypertension (PASP 60 mmHg), and mild to moderate mitral stenosis.  No known history of rheumatic fever. She has paroxysmal atrial fibrillation but is in NSR today. She has home oxygen which she only uses prn.  She never smoked. Recently noted to have moderate PAD by ABIs.  She denies claudication but has leg fatigue with exertion.  She had had what in the past has been thought to be venous stasis ulcers.  She has ecchymosis on her left calf after mechanical fall (no lightheadedness or syncope).   REDS clip 31%  Labs (9/19): K 3.8, creatinine 1.73  ECG (personally reviewed): NSR, short SVT run  PMH: 1. PAD: ABIs (7/20) 0.53 on right and 0.47 on left.  2. Chronic diastolic CHF: Echo (1/93) with EF 60-65%, mild LVH, normal RV size and systolic function, PA systolic pressure 60 mmHg, mild MR, mild-moderate mitral stenosis (mean gradient 6.5 mmHg), moderate LAE.  3. Atrial fibrillation: Paroxysmal.  S/p CVA.  4. HTN 5. Chronic venous insufficiency.  6. Hypothyroidism.  7. Hyperlipidemia 8. Bilateral carotid artery disease: Followed by vascular surgeon in [place].  9. CKD: Stage 3.  10. 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  11. Left TKR in 2018.  12. Mitral stenosis: Mild to moderate on echo in 1/20.   Social History   Socioeconomic History  . Marital status: Married    Spouse name: Not on  file  . Number of children: Not on file  . Years of education: Not on file  . Highest education level: Not on file  Occupational History  . Occupation: retired  Scientific laboratory technician  . Financial resource strain: Not on file  . Food insecurity    Worry: Not on file    Inability: Not on file  . Transportation needs    Medical: Not on file    Non-medical: Not on file  Tobacco Use  . Smoking status: Never Smoker  . Smokeless tobacco: Never Used  Substance and Sexual Activity  . Alcohol use: Yes    Alcohol/week: 1.0 standard drinks    Types: 1 Glasses of wine per week    Comment: social   . Drug use: No  . Sexual activity: Not on file  Lifestyle  . Physical activity    Days per week: Not on file    Minutes per session: Not on file  . Stress: Not on file  Relationships  . Social Herbalist on phone: Not on file    Gets together: Not on file    Attends religious service: Not on file    Active member of club or organization: Not on file    Attends meetings of clubs or organizations: Not on file    Relationship status: Not on file  . Intimate partner violence    Fear of current or ex partner: Not on file    Emotionally abused: Not on file  Physically abused: Not on file    Forced sexual activity: Not on file  Other Topics Concern  . Not on file  Social History Narrative  . Not on file   Family History  Problem Relation Age of Onset  . Heart attack Father 27  . Hypertension Father   . Hyperlipidemia Father   . Heart disease Brother        valvular disease    ROS: All systems reviewed and negative except as per HPI.   Current Outpatient Medications  Medication Sig Dispense Refill  . acetaminophen (TYLENOL) 500 MG tablet Take 1,000 mg by mouth daily as needed (for pain.).    Marland Kitchen allopurinol (ZYLOPRIM) 100 MG tablet Take 100 mg by mouth daily.     . cyanocobalamin (,VITAMIN B-12,) 1000 MCG/ML injection Inject 1,000 mcg into the muscle every 30 (thirty) days.    Marland Kitchen  ELIQUIS 5 MG TABS tablet Take 1 tablet by mouth twice daily 60 tablet 5  . furosemide (LASIX) 40 MG tablet Take 1 tablet (40 mg total) by mouth every morning AND 0.5 tablets (20 mg total) at bedtime. 90 tablet 3  . levothyroxine (SYNTHROID, LEVOTHROID) 112 MCG tablet Take 112 mcg by mouth daily before breakfast.    . lisinopril (PRINIVIL,ZESTRIL) 40 MG tablet Take 1 tablet (40 mg total) by mouth daily. 90 tablet 3  . metoprolol tartrate (LOPRESSOR) 50 MG tablet Take 1 tablet (50 mg total) by mouth 2 (two) times daily. 180 tablet 3  . simvastatin (ZOCOR) 20 MG tablet Take 20 mg by mouth daily.     Current Facility-Administered Medications  Medication Dose Route Frequency Provider Last Rate Last Dose  . sodium chloride flush (NS) 0.9 % injection 3 mL  3 mL Intravenous Q12H Larey Dresser, MD       Facility-Administered Medications Ordered in Other Encounters  Medication Dose Route Frequency Provider Last Rate Last Dose  . albuterol (PROVENTIL) (2.5 MG/3ML) 0.083% nebulizer solution 2.5 mg  2.5 mg Nebulization Once Laverle Hobby, MD       BP 126/64   Pulse 65   Wt 74.7 kg (164 lb 9.6 oz)   SpO2 99%   BMI 29.16 kg/m  General: NAD Neck: JVP 9-10 cm with HJR, no thyromegaly or thyroid nodule.  Lungs: Clear to auscultation bilaterally with normal respiratory effort. CV: Nondisplaced PMI.  Heart regular S1/S2, no S3/S4, 2/6 HSM LLSB.  1+ ankle edema.  No carotid bruit.  Difficult to palpate pedal pulses.  Abdomen: Soft, nontender, no hepatosplenomegaly, no distention.  Skin: Intact without lesions or rashes.  Neurologic: Alert and oriented x 3.  Psych: Normal affect. Extremities: No clubbing or cyanosis.  HEENT: Normal.   Assessment/Plan: 1. Chronic diastolic CHF: Echo in 4/27 showed EF 60-65%, mild LVH, normal-appearing RV with PASP 60 mmHg, mild-moderate mitral stenosis. On exam today, she is volume overloaded.  Though surprisingly, her REDS clip reading was only 31%, her BNP was  2543.  I think that the REDS clip is inaccurate and that she has fluid overload.  - Increase Lasix to 40 mg bid x 4 days then 40 qam/20 qpm.  BMET today and again in 10 days.  - With LVH and prominent CHF, I think that it would be reasonable to work her up for cardiac amyloidosis.  I will arrange for a PYP scan and send off myeloma panel, serum free light chains, and urine immunofixation today.  - I look over her last echo, cannot rule out mitral stenosis  as playing a role in her CHF (and pulmonary hypertension).  I will arrange for TEE to assess the mitral valve more closely. We discussed risks/benefits and she agrees to procedure.  - On the day of TEE, I will also do a right heart cath and left heart cath for pressures only.  This will allow me to assess her filling pressures, assess pulmonary hypertension, and assess for mitral stenosis as well. She will hold Eliquis the day before and the day of procedure.  We discussed risks/benefits and she agrees to procedure.  2. Pulmonary hypertension: Moderate pulmonary hypertension on last echo.  This may be group 2 PH (pulmonary venous hypertension) due to elevated left atrial pressure (from diastolic CHF versus mitral stenosis).  However, I would like to rule out pulmonary arterial hypertension (?group 1), so will plan RHC as above.  3. Mitral valve disease: I reviewed the last echo.  Mitral stenosis is at least mild, cannot rule out moderate.  No history of rheumatic fever but valve is calcified/thickened and may be rheumatic.   - I will plan close evaluation with RHC/LHC and TEE as above.  4. Atrial fibrillation: Paroxysmal.   - Continue Eliquis - Continue metoprolol.  5. PAD: No claudication but legs fatigue easily. Recent ABIs were moderately decreased.  She will be seeing vascular surgery in [place].  6. Hyperlipidemia: Goal LDL < 70 with vascular disease.  - Check lipids today.  7. CKD: Stage 3.  BMET today.   Loralie Champagne 07/30/2019

## 2019-07-31 LAB — SARS CORONAVIRUS 2 (TAT 6-24 HRS): SARS Coronavirus 2: NEGATIVE

## 2019-07-31 LAB — MULTIPLE MYELOMA PANEL, SERUM
Albumin SerPl Elph-Mcnc: 3.6 g/dL (ref 2.9–4.4)
Albumin/Glob SerPl: 1.3 (ref 0.7–1.7)
Alpha 1: 0.3 g/dL (ref 0.0–0.4)
Alpha2 Glob SerPl Elph-Mcnc: 0.9 g/dL (ref 0.4–1.0)
B-Globulin SerPl Elph-Mcnc: 1.1 g/dL (ref 0.7–1.3)
Gamma Glob SerPl Elph-Mcnc: 0.6 g/dL (ref 0.4–1.8)
Globulin, Total: 2.8 g/dL (ref 2.2–3.9)
IgA: 68 mg/dL (ref 64–422)
IgG (Immunoglobin G), Serum: <30 mg/dL — ABNORMAL LOW (ref 586–1602)
IgM (Immunoglobulin M), Srm: <5 mg/dL — ABNORMAL LOW (ref 26–217)
Total Protein ELP: 6.4 g/dL (ref 6.0–8.5)

## 2019-08-01 NOTE — Progress Notes (Signed)
Spoke with patient who states that she has remained in quarantine and is not experiencing any s/s of COVID 19 since her screening. Jobe Igo, RN

## 2019-08-02 ENCOUNTER — Encounter (HOSPITAL_COMMUNITY): Payer: Self-pay

## 2019-08-02 ENCOUNTER — Ambulatory Visit (HOSPITAL_COMMUNITY)
Admission: RE | Admit: 2019-08-02 | Discharge: 2019-08-02 | Disposition: A | Discharge status: Home / Self Care | Payer: Medicare Other | Attending: Cardiology | Admitting: Cardiology

## 2019-08-02 ENCOUNTER — Encounter (HOSPITAL_COMMUNITY)
Admission: RE | Disposition: A | Discharge status: Home / Self Care | Payer: Self-pay | Source: Home / Self Care | Attending: Cardiology

## 2019-08-02 ENCOUNTER — Ambulatory Visit (HOSPITAL_BASED_OUTPATIENT_CLINIC_OR_DEPARTMENT_OTHER)
Admission: RE | Admit: 2019-08-02 | Discharge: 2019-08-02 | Disposition: A | Discharge status: Home / Self Care | Payer: Medicare Other | Source: Ambulatory Visit | Attending: Cardiology | Admitting: Cardiology

## 2019-08-02 ENCOUNTER — Other Ambulatory Visit: Payer: Self-pay

## 2019-08-02 ENCOUNTER — Other Ambulatory Visit (HOSPITAL_COMMUNITY): Payer: Self-pay | Admitting: *Deleted

## 2019-08-02 DIAGNOSIS — Z8673 Personal history of transient ischemic attack (TIA), and cerebral infarction without residual deficits: Secondary | ICD-10-CM | POA: Insufficient documentation

## 2019-08-02 DIAGNOSIS — E785 Hyperlipidemia, unspecified: Secondary | ICD-10-CM | POA: Insufficient documentation

## 2019-08-02 DIAGNOSIS — I2721 Secondary pulmonary arterial hypertension: Secondary | ICD-10-CM | POA: Diagnosis not present

## 2019-08-02 DIAGNOSIS — N183 Chronic kidney disease, stage 3 (moderate): Secondary | ICD-10-CM | POA: Diagnosis not present

## 2019-08-02 DIAGNOSIS — I342 Nonrheumatic mitral (valve) stenosis: Secondary | ICD-10-CM

## 2019-08-02 DIAGNOSIS — Z7901 Long term (current) use of anticoagulants: Secondary | ICD-10-CM | POA: Diagnosis not present

## 2019-08-02 DIAGNOSIS — I739 Peripheral vascular disease, unspecified: Secondary | ICD-10-CM | POA: Diagnosis not present

## 2019-08-02 DIAGNOSIS — Z79899 Other long term (current) drug therapy: Secondary | ICD-10-CM | POA: Insufficient documentation

## 2019-08-02 DIAGNOSIS — I050 Rheumatic mitral stenosis: Secondary | ICD-10-CM | POA: Diagnosis present

## 2019-08-02 DIAGNOSIS — E039 Hypothyroidism, unspecified: Secondary | ICD-10-CM | POA: Diagnosis not present

## 2019-08-02 DIAGNOSIS — I480 Paroxysmal atrial fibrillation: Secondary | ICD-10-CM | POA: Insufficient documentation

## 2019-08-02 DIAGNOSIS — Z8249 Family history of ischemic heart disease and other diseases of the circulatory system: Secondary | ICD-10-CM | POA: Diagnosis not present

## 2019-08-02 DIAGNOSIS — I2510 Atherosclerotic heart disease of native coronary artery without angina pectoris: Secondary | ICD-10-CM | POA: Insufficient documentation

## 2019-08-02 DIAGNOSIS — I5032 Chronic diastolic (congestive) heart failure: Secondary | ICD-10-CM | POA: Diagnosis not present

## 2019-08-02 DIAGNOSIS — I6523 Occlusion and stenosis of bilateral carotid arteries: Secondary | ICD-10-CM | POA: Insufficient documentation

## 2019-08-02 DIAGNOSIS — I130 Hypertensive heart and chronic kidney disease with heart failure and stage 1 through stage 4 chronic kidney disease, or unspecified chronic kidney disease: Secondary | ICD-10-CM | POA: Diagnosis not present

## 2019-08-02 DIAGNOSIS — E8589 Other amyloidosis: Secondary | ICD-10-CM

## 2019-08-02 DIAGNOSIS — I340 Nonrheumatic mitral (valve) insufficiency: Secondary | ICD-10-CM

## 2019-08-02 DIAGNOSIS — I509 Heart failure, unspecified: Secondary | ICD-10-CM

## 2019-08-02 HISTORY — PX: RIGHT/LEFT HEART CATH AND CORONARY ANGIOGRAPHY: CATH118266

## 2019-08-02 HISTORY — PX: TEE WITHOUT CARDIOVERSION: SHX5443

## 2019-08-02 LAB — POCT I-STAT EG7
Acid-Base Excess: 1 mmol/L (ref 0.0–2.0)
Acid-Base Excess: 2 mmol/L (ref 0.0–2.0)
Bicarbonate: 25.7 mmol/L (ref 20.0–28.0)
Bicarbonate: 27 mmol/L (ref 20.0–28.0)
Calcium, Ion: 1.2 mmol/L (ref 1.15–1.40)
Calcium, Ion: 1.22 mmol/L (ref 1.15–1.40)
HCT: 42 % (ref 36.0–46.0)
HCT: 42 % (ref 36.0–46.0)
Hemoglobin: 14.3 g/dL (ref 12.0–15.0)
Hemoglobin: 14.3 g/dL (ref 12.0–15.0)
O2 Saturation: 64 %
O2 Saturation: 68 %
Potassium: 3 mmol/L — ABNORMAL LOW (ref 3.5–5.1)
Potassium: 3.1 mmol/L — ABNORMAL LOW (ref 3.5–5.1)
Sodium: 145 mmol/L (ref 135–145)
Sodium: 145 mmol/L (ref 135–145)
TCO2: 27 mmol/L (ref 22–32)
TCO2: 28 mmol/L (ref 22–32)
pCO2, Ven: 42 mmHg — ABNORMAL LOW (ref 44.0–60.0)
pCO2, Ven: 44.1 mmHg (ref 44.0–60.0)
pH, Ven: 7.394 (ref 7.250–7.430)
pH, Ven: 7.395 (ref 7.250–7.430)
pO2, Ven: 34 mmHg (ref 32.0–45.0)
pO2, Ven: 36 mmHg (ref 32.0–45.0)

## 2019-08-02 LAB — POCT I-STAT, CHEM 8
BUN: 47 mg/dL — ABNORMAL HIGH (ref 8–23)
Calcium, Ion: 1.07 mmol/L — ABNORMAL LOW (ref 1.15–1.40)
Chloride: 107 mmol/L (ref 98–111)
Creatinine, Ser: 1.3 mg/dL — ABNORMAL HIGH (ref 0.44–1.00)
Glucose, Bld: 96 mg/dL (ref 70–99)
HCT: 44 % (ref 36.0–46.0)
Hemoglobin: 15 g/dL (ref 12.0–15.0)
Potassium: 4.8 mmol/L (ref 3.5–5.1)
Sodium: 141 mmol/L (ref 135–145)
TCO2: 27 mmol/L (ref 22–32)

## 2019-08-02 LAB — POCT I-STAT 4, (NA,K, GLUC, HGB,HCT)
Glucose, Bld: 92 mg/dL (ref 70–99)
HCT: 45 % (ref 36.0–46.0)
Hemoglobin: 15.3 g/dL — ABNORMAL HIGH (ref 12.0–15.0)
Potassium: 3.3 mmol/L — ABNORMAL LOW (ref 3.5–5.1)
Sodium: 143 mmol/L (ref 135–145)

## 2019-08-02 SURGERY — RIGHT/LEFT HEART CATH AND CORONARY ANGIOGRAPHY
Anesthesia: LOCAL

## 2019-08-02 SURGERY — ECHOCARDIOGRAM, TRANSESOPHAGEAL
Anesthesia: Moderate Sedation

## 2019-08-02 MED ORDER — HYDRALAZINE HCL 20 MG/ML IJ SOLN: 10.0000 mg | INTRAMUSCULAR | Status: DC | PRN

## 2019-08-02 MED ORDER — SODIUM CHLORIDE 0.9% FLUSH: 3.0000 mL | Freq: Two times a day (BID) | INTRAVENOUS | Status: DC

## 2019-08-02 MED ORDER — ACETAMINOPHEN 325 MG PO TABS: 650.0000 mg | ORAL_TABLET | ORAL | Status: DC | PRN

## 2019-08-02 MED ORDER — SODIUM CHLORIDE 0.9% FLUSH: 3.0000 mL | INTRAVENOUS | Status: DC | PRN

## 2019-08-02 MED ORDER — HEPARIN SODIUM (PORCINE) 1000 UNIT/ML IJ SOLN
INTRAMUSCULAR | Status: DC | PRN
  Administered 2019-08-02: 4000 [IU] via INTRAVENOUS

## 2019-08-02 MED ORDER — MIDAZOLAM HCL (PF) 10 MG/2ML IJ SOLN
INTRAMUSCULAR | Status: DC | PRN
  Administered 2019-08-02: 2 mg via INTRAVENOUS

## 2019-08-02 MED ORDER — HEPARIN (PORCINE) IN NACL 1000-0.9 UT/500ML-% IV SOLN
INTRAVENOUS | Status: AC
  Filled 2019-08-02: qty 1000

## 2019-08-02 MED ORDER — SODIUM CHLORIDE 0.9 % IV SOLN
INTRAVENOUS | Status: AC | PRN
  Administered 2019-08-02: 10 mL/h via INTRAVENOUS

## 2019-08-02 MED ORDER — SODIUM CHLORIDE 0.9 % IV SOLN: 250.0000 mL | INTRAVENOUS | Status: DC | PRN

## 2019-08-02 MED ORDER — FENTANYL CITRATE (PF) 100 MCG/2ML IJ SOLN
INTRAMUSCULAR | Status: AC
  Filled 2019-08-02: qty 2

## 2019-08-02 MED ORDER — ASPIRIN 81 MG PO CHEW
CHEWABLE_TABLET | ORAL | Status: AC
  Filled 2019-08-02: qty 1

## 2019-08-02 MED ORDER — MIDAZOLAM HCL 2 MG/2ML IJ SOLN
INTRAMUSCULAR | Status: AC
  Filled 2019-08-02: qty 2

## 2019-08-02 MED ORDER — DIPHENHYDRAMINE HCL 50 MG/ML IJ SOLN
INTRAMUSCULAR | Status: AC
  Filled 2019-08-02: qty 1

## 2019-08-02 MED ORDER — VERAPAMIL HCL 2.5 MG/ML IV SOLN
INTRAVENOUS | Status: AC
  Filled 2019-08-02: qty 2

## 2019-08-02 MED ORDER — NITROGLYCERIN 1 MG/10 ML FOR IR/CATH LAB
INTRA_ARTERIAL | Status: AC
  Filled 2019-08-02: qty 10

## 2019-08-02 MED ORDER — LABETALOL HCL 5 MG/ML IV SOLN: 10.0000 mg | INTRAVENOUS | Status: DC | PRN

## 2019-08-02 MED ORDER — IOHEXOL 350 MG/ML SOLN
INTRAVENOUS | Status: DC | PRN
  Administered 2019-08-02: 70 mL via INTRA_ARTERIAL

## 2019-08-02 MED ORDER — LIDOCAINE HCL (PF) 1 % IJ SOLN
INTRAMUSCULAR | Status: DC | PRN
  Administered 2019-08-02 (×2): 2 mL

## 2019-08-02 MED ORDER — SODIUM CHLORIDE 0.9 % IV SOLN
INTRAVENOUS | Status: DC
  Administered 2019-08-02: 11:00:00 via INTRAVENOUS

## 2019-08-02 MED ORDER — SODIUM CHLORIDE 0.9 % IV SOLN: INTRAVENOUS | Status: DC

## 2019-08-02 MED ORDER — NITROGLYCERIN 1 MG/10 ML FOR IR/CATH LAB
INTRA_ARTERIAL | Status: DC | PRN
  Administered 2019-08-02: 200 ug via INTRA_ARTERIAL

## 2019-08-02 MED ORDER — FUROSEMIDE 40 MG PO TABS: 40.0000 mg | ORAL_TABLET | Freq: Two times a day (BID) | ORAL | 11 refills | Status: DC

## 2019-08-02 MED ORDER — MIDAZOLAM HCL (PF) 5 MG/ML IJ SOLN
INTRAMUSCULAR | Status: AC
  Filled 2019-08-02: qty 2

## 2019-08-02 MED ORDER — LIDOCAINE HCL (PF) 1 % IJ SOLN
INTRAMUSCULAR | Status: AC
  Filled 2019-08-02: qty 30

## 2019-08-02 MED ORDER — FENTANYL CITRATE (PF) 100 MCG/2ML IJ SOLN
INTRAMUSCULAR | Status: DC | PRN
  Administered 2019-08-02 (×2): 25 ug via INTRAVENOUS

## 2019-08-02 MED ORDER — POTASSIUM CHLORIDE 20 MEQ PO PACK: 40.0000 meq | PACK | Freq: Once | ORAL | Status: DC

## 2019-08-02 MED ORDER — HEPARIN (PORCINE) IN NACL 1000-0.9 UT/500ML-% IV SOLN
INTRAVENOUS | Status: DC | PRN
  Administered 2019-08-02 (×2): 500 mL

## 2019-08-02 MED ORDER — ASPIRIN 81 MG PO CHEW
81.0000 mg | CHEWABLE_TABLET | ORAL | Status: AC
  Administered 2019-08-02: 81 mg via ORAL

## 2019-08-02 MED ORDER — MIDAZOLAM HCL 2 MG/2ML IJ SOLN
INTRAMUSCULAR | Status: DC | PRN
  Administered 2019-08-02 (×2): 1 mg via INTRAVENOUS

## 2019-08-02 MED ORDER — FENTANYL CITRATE (PF) 100 MCG/2ML IJ SOLN
INTRAMUSCULAR | Status: AC
  Filled 2019-08-02: qty 4

## 2019-08-02 MED ORDER — VERAPAMIL HCL 2.5 MG/ML IV SOLN
INTRAVENOUS | Status: DC | PRN
  Administered 2019-08-02: 10 mL via INTRA_ARTERIAL

## 2019-08-02 MED ORDER — ONDANSETRON HCL 4 MG/2ML IJ SOLN: 4.0000 mg | Freq: Four times a day (QID) | INTRAMUSCULAR | Status: DC | PRN

## 2019-08-02 MED ORDER — FENTANYL CITRATE (PF) 100 MCG/2ML IJ SOLN
INTRAMUSCULAR | Status: DC | PRN
  Administered 2019-08-02: 25 ug via INTRAVENOUS

## 2019-08-02 MED ORDER — HEPARIN SODIUM (PORCINE) 1000 UNIT/ML IJ SOLN
INTRAMUSCULAR | Status: AC
  Filled 2019-08-02: qty 1

## 2019-08-02 SURGICAL SUPPLY — 16 items
CATH 5FR JL3.5 JR4 ANG PIG MP (CATHETERS) ×1 IMPLANT
CATH BALLN WEDGE 5F 110CM (CATHETERS) ×1 IMPLANT
CATH INFINITI 5 FR 3DRC (CATHETERS) ×1 IMPLANT
CATH LAUNCHER 5F 3DRIGHT (CATHETERS) IMPLANT
CATHETER LAUNCHER 5F 3DRIGHT (CATHETERS) ×2
COVER DOME SNAP 22 D (MISCELLANEOUS) ×1 IMPLANT
DEVICE RAD COMP TR BAND LRG (VASCULAR PRODUCTS) ×1 IMPLANT
GUIDEWIRE INQWIRE 1.5J.035X260 (WIRE) IMPLANT
INQWIRE 1.5J .035X260CM (WIRE) ×2
KIT HEART LEFT (KITS) ×1 IMPLANT
PACK CARDIAC CATHETERIZATION (CUSTOM PROCEDURE TRAY) ×2 IMPLANT
SHEATH GLIDE SLENDER 4/5FR (SHEATH) ×1 IMPLANT
SHEATH RAIN RADIAL 21G 6FR (SHEATH) ×1 IMPLANT
TRANSDUCER W/STOPCOCK (MISCELLANEOUS) ×2 IMPLANT
TUBING CIL FLEX 10 FLL-RA (TUBING) ×1 IMPLANT
WIRE HI TORQ VERSACORE-J 145CM (WIRE) ×1 IMPLANT

## 2019-08-02 NOTE — Discharge Instructions (Signed)
Radial Site Care ° °This sheet gives you information about how to care for yourself after your procedure. Your health care provider may also give you more specific instructions. If you have problems or questions, contact your health care provider. °What can I expect after the procedure? °After the procedure, it is common to have: °· Bruising and tenderness at the catheter insertion area. °Follow these instructions at home: °Medicines °· Take over-the-counter and prescription medicines only as told by your health care provider. °Insertion site care °· Follow instructions from your health care provider about how to take care of your insertion site. Make sure you: °? Wash your hands with soap and water before you change your bandage (dressing). If soap and water are not available, use hand sanitizer. °? Change your dressing as told by your health care provider. °? Leave stitches (sutures), skin glue, or adhesive strips in place. These skin closures may need to stay in place for 2 weeks or longer. If adhesive strip edges start to loosen and curl up, you may trim the loose edges. Do not remove adhesive strips completely unless your health care provider tells you to do that. °· Check your insertion site every day for signs of infection. Check for: °? Redness, swelling, or pain. °? Fluid or blood. °? Pus or a bad smell. °? Warmth. °· Do not take baths, swim, or use a hot tub until your health care provider approves. °· You may shower 24-48 hours after the procedure, or as directed by your health care provider. °? Remove the dressing and gently wash the site with plain soap and water. °? Pat the area dry with a clean towel. °? Do not rub the site. That could cause bleeding. °· Do not apply powder or lotion to the site. °Activity ° °· For 24 hours after the procedure, or as directed by your health care provider: °? Do not flex or bend the affected arm. °? Do not push or pull heavy objects with the affected arm. °? Do not  drive yourself home from the hospital or clinic. You may drive 24 hours after the procedure unless your health care provider tells you not to. °? Do not operate machinery or power tools. °· Do not lift anything that is heavier than 10 lb (4.5 kg), or the limit that you are told, until your health care provider says that it is safe. °· Ask your health care provider when it is okay to: °? Return to work or school. °? Resume usual physical activities or sports. °? Resume sexual activity. °General instructions °· If the catheter site starts to bleed, raise your arm and put firm pressure on the site. If the bleeding does not stop, get help right away. This is a medical emergency. °· If you went home on the same day as your procedure, a responsible adult should be with you for the first 24 hours after you arrive home. °· Keep all follow-up visits as told by your health care provider. This is important. °Contact a health care provider if: °· You have a fever. °· You have redness, swelling, or yellow drainage around your insertion site. °Get help right away if: °· You have unusual pain at the radial site. °· The catheter insertion area swells very fast. °· The insertion area is bleeding, and the bleeding does not stop when you hold steady pressure on the area. °· Your arm or hand becomes pale, cool, tingly, or numb. °These symptoms may represent a serious problem   that is an emergency. Do not wait to see if the symptoms will go away. Get medical help right away. Call your local emergency services (911 in the [place] Do not drive yourself to the hospital. °Summary °· After the procedure, it is common to have bruising and tenderness at the site. °· Follow instructions from your health care provider about how to take care of your radial site wound. Check the wound every day for signs of infection. °· Do not lift anything that is heavier than 10 lb (4.5 kg), or the limit that you are told, until your health care provider says  that it is safe. °This information is not intended to replace advice given to you by your health care provider. Make sure you discuss any questions you have with your health care provider. °Document Released: 01/14/2011 Document Revised: 01/17/2018 Document Reviewed: 01/17/2018 °Elsevier Patient Education © 2020 [employer] ° °

## 2019-08-02 NOTE — Interval H&P Note (Signed)
History and Physical Interval Note:  08/02/2019 1:24 PM  Caroline [name]  has presented today for surgery, with the diagnosis of hp.  The various methods of treatment have been discussed with the patient and family. After consideration of risks, benefits and other options for treatment, the patient has consented to  Procedure(s): RIGHT/LEFT HEART CATH AND CORONARY ANGIOGRAPHY (N/A) as a surgical intervention.  The patient's history has been reviewed, patient examined, no change in status, stable for surgery.  I have reviewed the patient's chart and labs.  Questions were answered to the patient's satisfaction.     Dandrea Widdowson [employer]

## 2019-08-02 NOTE — Progress Notes (Signed)
  Echocardiogram Echocardiogram Transesophageal has been performed.  Bobbye Charleston 08/02/2019, 12:47 PM

## 2019-08-02 NOTE — Interval H&P Note (Signed)
History and Physical Interval Note:  08/02/2019 12:12 PM  Caroline [name]  has presented today for surgery, with the diagnosis of MITRAL STENOSIS.  The various methods of treatment have been discussed with the patient and family. After consideration of risks, benefits and other options for treatment, the patient has consented to  Procedure(s): TRANSESOPHAGEAL ECHOCARDIOGRAM (TEE) (N/A) as a surgical intervention.  The patient's history has been reviewed, patient examined, no change in status, stable for surgery.  I have reviewed the patient's chart and labs.  Questions were answered to the patient's satisfaction.     Kashay Cavenaugh [employer]

## 2019-08-02 NOTE — CV Procedure (Signed)
Procedure: TEE  Indication: Mitral valve disorder.   Sedation: Versed 1 mg IV, Fentanyl 25 mcg IV  Findings: Please see echo section for full report.  Normal left ventricular size with mild LV hypertrophy. Normal wall motion.  The right ventricle is mildly dilated with mildly decreased systolic function.  There was moderate TR, peak RV-RA gradient 53 mmHg.  The aortic valve was trileaflet and moderately calcified, trivial aortic insufficiency but no significant stenosis.  The mitral valve was thickened/calcified with significant restriction of the posterior leaflet.  ?Rheumatic.  There was severe mitral regurgitation with ERO 0.36 cm^2 but calculated regurgitant volume 97 cc.  There was flow reversal in the pulmonary vein systolic doppler pattern.  Mean gradient across the valve 5 mmHg, MVA 3.38 cm^2 by PHT but 2.35 cm^2 by planimetry.  Suspect no more than mild mitral stenosis.  No evidence for PFO/ASD by color doppler. Normal caliber thoracic aorta with grade 4 plaque in the descending thoracic aorta.    I suspect mild mitral stenosis, severe mitral regurgitation.  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    Caroline [name] 08/02/2019 12:46 PM

## 2019-08-04 ENCOUNTER — Encounter (HOSPITAL_COMMUNITY): Payer: Self-pay | Admitting: Cardiology

## 2019-08-05 ENCOUNTER — Institutional Professional Consult (permissible substitution) (INDEPENDENT_AMBULATORY_CARE_PROVIDER_SITE_OTHER): Payer: Medicare Other | Admitting: Thoracic Surgery (Cardiothoracic Vascular Surgery)

## 2019-08-05 ENCOUNTER — Other Ambulatory Visit: Payer: Self-pay

## 2019-08-05 ENCOUNTER — Encounter (HOSPITAL_COMMUNITY): Payer: Self-pay | Admitting: Cardiology

## 2019-08-05 ENCOUNTER — Ambulatory Visit: Payer: Medicare Other | Admitting: Physician Assistant

## 2019-08-05 ENCOUNTER — Telehealth (HOSPITAL_COMMUNITY): Payer: Self-pay | Admitting: Vascular Surgery

## 2019-08-05 VITALS — BP 120/83 | HR 69 | Temp 97.5°F | Resp 16 | Ht 62.0 in | Wt 160.0 lb

## 2019-08-05 DIAGNOSIS — I2510 Atherosclerotic heart disease of native coronary artery without angina pectoris: Secondary | ICD-10-CM | POA: Diagnosis not present

## 2019-08-05 DIAGNOSIS — I340 Nonrheumatic mitral (valve) insufficiency: Secondary | ICD-10-CM | POA: Diagnosis not present

## 2019-08-05 DIAGNOSIS — I5032 Chronic diastolic (congestive) heart failure: Secondary | ICD-10-CM

## 2019-08-05 DIAGNOSIS — I480 Paroxysmal atrial fibrillation: Secondary | ICD-10-CM | POA: Diagnosis not present

## 2019-08-05 DIAGNOSIS — I071 Rheumatic tricuspid insufficiency: Secondary | ICD-10-CM

## 2019-08-05 DIAGNOSIS — I052 Rheumatic mitral stenosis with insufficiency: Secondary | ICD-10-CM

## 2019-08-05 NOTE — Patient Instructions (Signed)
Continue all previous medications without any changes at this time  Check your weight on a regular basis and keep a log for your records.  Look for signs of fluid overload such as worsening swelling of your lower legs, increased shortness of breath with activity, and/or a dry nonproductive cough.  Discussed these findings with your cardiologist including whether or not you should adjust your fluid pill dosage (diuretic).   

## 2019-08-05 NOTE — Progress Notes (Addendum)
[address]       [place] [address]             [phone]     CARDIOTHORACIC SURGERY CONSULTATION REPORT  Referring Provider is Loralie Champagne, MD Primary Cardiologist is Ida Rogue, MD PCP is Remi Haggard, FNP  Chief Complaint  Patient presents with   Coronary Artery Disease    3 vessel per CATH/TEE 08/02/19...ECHO 01/24/19, LE ABI'S 07/23/19   Mitral Regurgitation    severe   Mitral Stenosis    mild   Congestive Heart Failure    HPI:  Patient is a 78 year old moderately obese female with history of mitral stenosis and mitral regurgitation, coronary artery disease, paroxysmal atrial fibrillation on long-term anticoagulation using Eliquis, previous TIA and stroke on 2 occasions, chronic diastolic congestive heart failure, stage IV chronic kidney disease, and hypothyroidism who has been referred for surgical consultation to discuss treatment options for management of severe symptomatic mitral regurgitation and multivessel coronary artery disease.  Patient states that she has never previously been told that she had a heart murmur and she denies any known history of rheumatic heart disease during childhood.  She states that she has a long history of progressive dyspnea on exertion dating back several years.  She experienced a TIA in September 2017 and another similar event in April 2018 associated with slurred speech and right-sided weakness.  MRI at that time revealed punctate acute infarct of the right postcentral gyrus.  Carotid duplex revealed mild bilateral carotid stenosis.  She was diagnosed with paroxysmal atrial fibrillation and initially evaluated by Dr. [name] in July 2018.  Echocardiogram performed at that time revealed normal left ventricular systolic function and moderate mitral regurgitation.  She was started on oral Eliquis for anticoagulation at that time and has been followed carefully ever since.  In December 2018 she underwent left  total knee replacement.  She was hospitalized for acute exacerbation of chronic diastolic heart failure during the early postoperative period and echo performed at that time revealed normal left ventricular systolic function with what was felt to be moderate mitral stenosis and mild mitral regurgitation.  She was placed on home oxygen therapy at that time.  Follow-up echocardiogram performed January 24, 2019 revealed normal left ventricular systolic function with what was felt to be mild mitral regurgitation and mild to moderate mitral stenosis.  Pulmonary artery pressures were notably elevated by echo at that time.  In early July the patient suffered a mechanical fall at home and suffered a groin injury.  She has been quite sedentary since that time and developed significant fluid retention and lower extremity edema.  She was seen in follow-up by Dr. [name] and subsequently referred to Dr. Aundra Dubin in the advanced heart failure clinic.  TEE and diagnostic cardiac catheterization were both performed on August 02, 2019.  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  There was normal left ventricular size and systolic function.  There was mild right ventricular chamber enlargement and decreased right ventricular function.  There was moderate tricuspid regurgitation.  Cardiac catheterization revealed severe three-vessel coronary artery disease as well as low cardiac output and elevated left and right heart filling pressures.  There were large V waves on wedge tracing consistent with severe mitral regurgitation.  Cardiothoracic surgical consultation was requested.  Patient is married and lives locally in [place] with her husband.  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  She lives a sedentary lifestyle.  She has been severely limited by exertional shortness of breath  for several years.  This is progressed considerably over the last 6 months.  The patient suffered a mechanical fall approximately 1 month ago and apparently injured her groin muscle.  She has been very sedentary ever since and now gets around using an Libr[employer], academic.  She states that she is able to ambulate around the house and her mobility has been improving.  At the time of her injury she had severe lower extremity edema with active transudate of weeping of lymphedema to her lower extremities and a small skin tear for which she was seen in the wound clinic.  All of this has improved in the skin wound is healing on the right lower leg.  At present she gets short of breath with very low level activity.  She denies resting shortness of breath.  She has active lower extremity edema although this has improved.  She does not lay flat in bed.  She has not had any palpitations or dizzy spells.  She uses home oxygen as needed at night for sleeping.  She has recently been sleeping in a recliner.  She denies fevers, chills, or productive cough.  Past Medical History:  Diagnosis Date   Arthritis    CAD    Chronic diastolic CHF (congestive heart failure) (HCC)    CKD (chronic kidney disease), stage IV (HCC)    coronary artery disease    History of kidney stones 01/2017   Hypertension    Hypothyroidism    Mitral regurgitation and mitral stenosis    PAF (paroxysmal atrial fibrillation) ([facility])    a. On Eliquis; b. CHADS2VASc => 8 (CHF, HTN, age x 2, stroke x 2, vascular disease, female)   Stroke [facility])    TIA 9/17 & 04/06/2017   Tricuspid regurgitation     Past Surgical History:  Procedure Laterality Date   ABDOMINAL HYSTERECTOMY     CARDIAC CATHETERIZATION     Negative    COLONOSCOPY  2010   PARTIAL HYSTERECTOMY     RIGHT/LEFT HEART CATH AND CORONARY ANGIOGRAPHY N/A 08/02/2019   Procedure: RIGHT/LEFT HEART CATH AND CORONARY ANGIOGRAPHY;  Surgeon: Larey Dresser, MD;  Location: [facility];  Service: Cardiovascular;  Laterality: N/A;   TEE WITHOUT CARDIOVERSION N/A 08/02/2019   Procedure: TRANSESOPHAGEAL ECHOCARDIOGRAM (TEE);  Surgeon: Larey Dresser, MD;  Location: [facility] ENDOSCOPY;  Service: Cardiovascular;  Laterality: N/A;   TOTAL KNEE ARTHROPLASTY Left 11/28/2017   Procedure: TOTAL KNEE ARTHROPLASTY;  Surgeon: Thornton Park, MD;  Location: [facility] ORS;  Service: Orthopedics;  Laterality: Left;    Family History  Problem Relation Age of Onset   Heart attack Father 22   Hypertension Father    Hyperlipidemia Father    Heart disease Brother        valvular disease     Social History   Socioeconomic History   Marital status: Married    Spouse name: Not on file   Number of children: Not on file   Years of education: Not on file   Highest education level: Not on file  Occupational History   Occupation: retired  Scientist, product/process development strain: Not on file   Food insecurity    Worry: Not on file    Inability: Not on file   Transportation needs    Medical: Not on file  Non-medical: Not on file  Tobacco Use   Smoking status: Never Smoker   Smokeless tobacco: Never Used  Substance and Sexual Activity   Alcohol use: Yes    Alcohol/week: 1.0 standard drinks    Types: 1 Glasses of wine per week    Comment: social    Drug use: No   Sexual activity: Not on file  Lifestyle   Physical activity    Days per week: Not on file    Minutes per session: Not on file   Stress: Not on file  Relationships   Social connections    Talks on phone: Not on file    Gets together: Not on file    Attends religious service: Not on file    Active member of club or organization: Not on file    Attends meetings of clubs or organizations: Not on file    Relationship status: Not on file   Intimate partner violence    Fear of current or ex partner: Not on file    Emotionally abused: Not on file    Physically abused: Not on file     Forced sexual activity: Not on file  Other Topics Concern   Not on file  Social History Narrative   Not on file    Current Outpatient Medications  Medication Sig Dispense Refill   acetaminophen (TYLENOL) 500 MG tablet Take 1,000 mg by mouth daily as needed (for pain.).     allopurinol (ZYLOPRIM) 100 MG tablet Take 100 mg by mouth daily.      ELIQUIS 5 MG TABS tablet Take 1 tablet by mouth twice daily (Patient taking differently: Take 5 mg by mouth 2 (two) times daily. ) 60 tablet 5   furosemide (LASIX) 40 MG tablet Take 1 tablet (40 mg total) by mouth 2 (two) times daily. 60 tablet 11   levothyroxine (SYNTHROID, LEVOTHROID) 112 MCG tablet Take 112 mcg by mouth daily before breakfast.     lisinopril (PRINIVIL,ZESTRIL) 40 MG tablet Take 1 tablet (40 mg total) by mouth daily. 90 tablet 3   metoprolol tartrate (LOPRESSOR) 50 MG tablet Take 1 tablet (50 mg total) by mouth 2 (two) times daily. (Patient taking differently: Take 50 mg by mouth daily. ) 180 tablet 3   simvastatin (ZOCOR) 20 MG tablet Take 20 mg by mouth daily.     cyanocobalamin (,VITAMIN B-12,) 1000 MCG/ML injection Inject 1,000 mcg into the muscle every 30 (thirty) days.     No current facility-administered medications for this visit.    Facility-Administered Medications Ordered in Other Visits  Medication Dose Route Frequency Provider Last Rate Last Dose   albuterol (PROVENTIL) (2.5 MG/3ML) 0.083% nebulizer solution 2.5 mg  2.5 mg Nebulization Once Laverle Hobby, MD        No Known Allergies    Review of Systems:   General:  normal appetite, decreased energy, + weight gain, no weight loss, no fever  Cardiac:  no chest pain with exertion, no chest pain at rest, +SOB with low level exertion, no current resting SOB, no PND, + orthopnea, no palpitations, no arrhythmia, + atrial fibrillation, + LE edema, no dizzy spells, no syncope  Respiratory:  + shortness of breath, + home oxygen, no productive cough, no  dry cough, no bronchitis, no wheezing, no hemoptysis, no asthma, no pain with inspiration or cough, no sleep apnea, no CPAP at night  GI:   no difficulty swallowing, no reflux, no frequent heartburn, no hiatal hernia, no abdominal pain, no constipation,  no diarrhea, no hematochezia, no hematemesis, no melena  GU:   no dysuria,  no frequency, no urinary tract infection, no hematuria, no kidney stones, + kidney disease  Vascular:  no pain suggestive of claudication, no pain in feet, no leg cramps, + varicose veins, no DVT, no non-healing foot ulcer  Neuro:   + stroke, + TIA's, no seizures, no headaches, no temporary blindness one eye,  no slurred speech, no peripheral neuropathy, no chronic pain, + instability of gait, no memory/cognitive dysfunction  Musculoskeletal: + arthritis, no joint swelling, no myalgias, some difficulty walking, limited mobility   Skin:   no rash, no itching, no skin infections, + pressure sores or ulcerations  Psych:   no anxiety, no depression, no nervousness, no unusual recent stress  Eyes:   no blurry vision, no floaters, no recent vision changes, + wears glasses or contacts  ENT:   no hearing loss, no loose or painful teeth, no dentures, last saw dentist 3 years ago  Hematologic:  + easy bruising, no abnormal bleeding, no clotting disorder, no frequent epistaxis  Endocrine:  no diabetes, does not check CBG's at home     Physical Exam:   BP 120/83 (BP Location: Left Arm, Patient Position: Sitting, Cuff Size: Large)    Pulse 69    Temp (!) 97.5 F (36.4 C) (Other (Comment)) Comment (Src): thermal   Resp 16    Ht 5\' 2"  (1.575 m)    Wt 160 lb (72.6 kg)    SpO2 96% Comment: RA   BMI 29.26 kg/m   General:  Obese, somewhat debilitated female in NAD   HEENT:  Unremarkable   Neck:   no JVD, no bruits, no adenopathy   Chest:   clear to auscultation, symmetrical breath sounds, no wheezes, no rhonchi   CV:   RRR, grade III/VI holosystolic murmur   Abdomen:  soft, non-tender, no  masses   Extremities:  warm, well-perfused, pulses not palpable, + bilateral LE edema, + chronic venous insufficiency  Rectal/GU  Deferred  Neuro:   Grossly non-focal and symmetrical throughout  Skin:   Clean and dry, no rashes, + dry eschar right anterior shin w/out cellulitis   Diagnostic Tests:  TRANSTHORACIC ECHOCARDIOGRAM REPORT     Patient Name:   Caroline [name] Date of Exam: 01/24/2019 Medical Rec #:  [id number]       Height:       62.0 in Accession #:    [id number]      Weight:       156.5 lb Date of Birth:  Jul 06, 1941       BSA:          1.72 m Patient Age:    39 years        BP:           142/84 mmHg Patient Gender: F               HR:           72 bpm. Exam Location:  [facility]    Procedure: 2D Echo, Color Doppler and Cardiac Doppler  Indications:     R06.00 Dyspnea   History:         Patient has prior history of Echocardiogram examinations. CHF,                  CAD, TIA and Pulmonary HTN, Atrial Fibrillation, Aortic Valve  Disease and Mitral Stenosis; Signs/Symptoms: Dyspnea; Risk                  Factors: Dyslipidemia, Hypertension and Non-Smoker.   Sonographer:     Pilar Jarvis RDMS, RVT, RDCS Referring Phys:  [id number] [place] Diagnosing Phys: Ida Rogue MD  IMPRESSIONS    1. The left ventricle has normal systolic function of 123456. The cavity size is mildly increased. There is mild left ventricular wall thickness. Echo evidence of pseudonormal diastolic filling patterns.  2. The right ventricle is in size. There is normal systolic function. Right ventricular systolic pressure is severely elevated with an estimated pressure of 60.0 mmHg.  3. Moderately dilated left atrial size.     There is mild to moderate mitral annular calcification present. Regurgitation is mild by color flow Doppler . At least mild mitral valve stenosis, possibly moderate stenosis.  4. The inferior vena cava was normal in size <50% respiratory  variablity.  FINDINGS  Left Ventricle: No evidence of left ventricular regional wall motion abnormalities. The left ventricle has normal systolic function of 123456. The cavity size is mildly increased. There is mild left ventricular wall thickness. Echo evidence of  pseudonormal diastolic filling patterns. Right Ventricle: The right ventricle is normal in size. There is normal hypertrophy. There is normal systolic function. Right ventricular systolic pressure is severely elevated with an estimated pressure of 60.0 mmHg. Left Atrium: The left atrium is moderately dilated. Right Atrium: The right atrial size is normal in size. Interatrial Septum: No atrial level shunt detected by color flow Doppler.  Pericardium: There is no evidence of pericardial effusion. Mitral Valve: The mitral valve normal in structure. There is mild to moderate mitral annular calcification present. Regurgitation is mild by color flow Doppler . At least mild mitral valve stenosis, possibly moderate stenosis. Tricuspid Valve: The tricuspid valve is normal in structure. Tricuspid regurgitation is mild by color flow Doppler. Aortic Valve: The aortic valve was not well visualized. The aortic valve normal in structure and function.. Aortic valve regurgitation was not visualized by color flow Doppler. The calculated aortic valve area is 1.58 cm. Pulmonic Valve: The pulmonic valve is normal. Pulmonic valve regurgitation is not visualized by color flow Doppler. Venous: The inferior vena cava was normal in size less than 50% respiratory variablity.   LEFT VENTRICLE PLAX 2D (Te[place])              Biplane EF (MOD) LV EF:          62.2 %       LV Biplane EF:   58.7 % LVIDd:          5.60 cm      LV A4C EF:       62.7 % LVIDs:          3.70 cm      LV A2C EF:       53.4 % LV PW:          1.20 cm LV IVS:         1.20 cm      Diastology LVOT diam:      1.90 cm      LV e' lateral:   7.94 cm/s LV SV:          96 ml        LV E/e'  lateral: 23.6 LVOT Area:      2.84 cm     LV e' medial:    4.57 cm/s  LV E/e' medial:  40.9 LV Volumes (MOD) LV area d, A2C:    30.70 cm LV area d, A4C:    30.60 cm LV area s, A2C:    18.90 cm LV area s, A4C:    17.30 cm LV major d, A2C:   8.18 cm LV major d, A4C:   8.01 cm LV major s, A2C:   7.02 cm LV major s, A4C:   6.92 cm LV vol d, MOD A2C: 102.0 ml LV vol d, MOD A4C: 98.3 ml LV vol s, MOD A2C: 47.5 ml LV vol s, MOD A4C: 36.7 ml LV SV MOD A2C:     54.5 ml LV SV MOD A4C:     98.3 ml LV SV MOD BP:      58.9 ml  RIGHT VENTRICLE RV S prime:     13.80 cm/s TAPSE (M-mode): 3.1 cm RVSP:           60.0 mmHg  LEFT ATRIUM             Index       RIGHT ATRIUM           Index LA diam:        4.50 cm 2.61 cm/m  RA Pressure: 10 mmHg LA Vol (A2C):   81.8 ml 47.49 ml/m RA Area:     16.80 cm LA Vol (A4C):   69.3 ml 40.23 ml/m RA Volume:   42.30 ml  24.56 ml/m LA Biplane Vol: 75.7 ml 43.95 ml/m  AORTIC VALVE                    PULMONIC VALVE AV Area (Vmean):   1.81 cm     PV Vmax:       0.71 m/s AV Area (VTI):     1.58 cm     PV Peak grad:  2.0 mmHg AV Vmean:          120.600 cm/s AV VTI:            0.421 m AV Mean Grad:      7.4 mmHg LVOT Vmean:        77.200 cm/s LVOT VTI:          0.235 m LVOT/AV VTI ratio: 0.56   AORTA Ao Root diam: 2.50 cm Ao Asc diam:  3.00 cm Ao Arch diam: 2.1 cm  MITRAL VALVE               TR Peak grad: 50.0 mmHg MV Area (PHT): 3.27 cm    TR Vmax:      385.00 cm/s MV Peak grad:  17.4 mmHg   RVSP:         60.0 mmHg MV Mean grad:  6.5 mmHg MV Vmax:       2.08 m/s MV Vmean:      116.5 cm/s MV VTI:        0.64 m MV PHT:        67.28 msec MV Decel Time: 232 msec MV E velocity: 187.00 cm/s MV A velocity: 120.00 cm/s MV E/A ratio:  1.56  IVC IVC diam: 1.90 cm    Ida Rogue MD Electronically signed by Ida Rogue MD Signature Date/Time: 01/25/2019/8:34:49 PM                          MODIFIED  REPORT This report was modified by Ida Rogue MD on 01/28/2019 due to.     Procedure:  TEE  Indication: Mitral valve disorder.   Sedation: Versed 1 mg IV, Fentanyl 25 mcg IV  Findings: Please see echo section for full report.  Normal left ventricular size with mild LV hypertrophy. Normal wall motion.  The right ventricle is mildly dilated with mildly decreased systolic function.  There was moderate TR, peak RV-RA gradient 53 mmHg.  The aortic valve was trileaflet and moderately calcified, trivial aortic insufficiency but no significant stenosis.  The mitral valve was thickened/calcified with significant restriction of the posterior leaflet.  ?Rheumatic.  There was severe mitral regurgitation with ERO 0.36 cm^2 but calculated regurgitant volume 97 cc.  There was flow reversal in the pulmonary vein systolic doppler pattern.  Mean gradient across the valve 5 mmHg, MVA 3.38 cm^2 by PHT but 2.35 cm^2 by planimetry.  Suspect no more than mild mitral stenosis.  No evidence for PFO/ASD by color doppler. Normal caliber thoracic aorta with grade 4 plaque in the descending thoracic aorta.    I suspect mild mitral stenosis, severe mitral regurgitation.  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    Loralie Champagne 08/02/2019 12:46 PM    RIGHT/LEFT HEART CATH AND CORONARY ANGIOGRAPHY  Conclusion  1. Severe 3 vessel disease.  2. Elevated right and left heart filling pressures.  3. Low cardiac output (CI 2.0).  4. 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  5. Prominent v-wave in the PCWP tracing is consistent with severe mitral regurgitation noted on TEE.  6. The discrepancy between LVEDP and PCWP suggests a degree of mitral stenosis (MS looked mild on TEE).   No chest pain and dyspnea is stable.  I will let her go home today but will increase Lasix to 40 mg bid.  I am going to refer Mrs Dua for TCTS evaluation.  She will need CABG + MV replacement ideally.  She  may need to be admitted prior to surgery for hemodynamic optimization.  She will need close followup in CHF clinic.  She will need BMET next week.   Surgeon Notes    08/02/2019 12:46 PM CV Procedure signed by Larey Dresser, MD  Procedural Details  Technical Details Procedure: Right Heart Cath, Left Heart Cath, Selective Coronary Angiography  Indication: Mitral valve disorder, CHF   Procedural Details: The right brachial and radial areas were prepped, draped, and anesthetized with 1% lidocaine. There was a pre-existing peripheral IV in the right brachial area that was replaced with a 58F venous sheath.  A Swan-Ganz catheter was used for the right heart catheterization. Standard protocol was followed for recording of right heart pressures and sampling of oxygen saturations. Fick cardiac output was calculated. The right radial artery was entered using modified Seldinger technique and a 58F sheath was placed.  The patient received 3 mg IA verapamil and weight-based IV heparin.  Standard Judkins catheters were used for selective coronary angiography. There were no immediate procedural complications. The patient was transferred to the post catheterization recovery area for further monitoring.  Estimated blood loss <50 mL.   During this procedure medications were administered to achieve and maintain moderate conscious sedation while the patient's heart rate, blood pressure, and oxygen saturation were continuously monitored and I was present face-to-face 100% of this time.  Medications (Filter: Administrations occurring from 08/02/19 1304 to 08/02/19 1438) (important)  Continuous medications are totaled by the amount administered until 08/02/19 1438.  Medication Rate/Dose/Volume Action  Date Time   Heparin (Porcine) in NaCl 1000-0.9 UT/500ML-% SOLN (mL) 500 mL Given 08/02/19 1319   Total  dose as of 08/02/19 1438 500 mL Given 1319   1,000 mL        0.9 % sodium chloride infusion (mL/hr) 10 mL/hr New  Bag/Given 08/02/19 1326   Dosing weight:  74.7 kg        Total dose as of 08/02/19 1438        Cannot be calculated        fentaNYL (SUBLIMAZE) injection (mcg) 25 mcg Given 08/02/19 1329   Total dose as of 08/02/19 1438 25 mcg Given 1346   50 mcg        midazolam (VERSED) injection (mg) 1 mg Given 08/02/19 1329   Total dose as of 08/02/19 1438 1 mg Given 1346   2 mg        lidocaine (PF) (XYLOCAINE) 1 % injection (mL) 2 mL Given 08/02/19 1335   Total dose as of 08/02/19 1438 2 mL Given 1335   4 mL        Radial Cocktail/Verapamil only (mL) 10 mL Given 08/02/19 1344   Total dose as of 08/02/19 1438        10 mL        heparin injection (Units) 4,000 Units Given 08/02/19 1346   Total dose as of 08/02/19 1438        4,000 Units        nitroGLYCERIN 1 mg/10 mL (100 mcg/mL) - IR/CATH LAB (mcg) 200 mcg Given 08/02/19 1357   Total dose as of 08/02/19 1438        200 mcg        iohexol (OMNIPAQUE) 350 MG/ML injection (mL) 70 mL Given 08/02/19 1419   Total dose as of 08/02/19 1438        70 mL        potassium chloride (KLOR-CON) packet 40 mEq (mEq) *40 mEq Not Given 08/02/19 1307   Dosing weight:  74.7 kg        Total dose as of 08/02/19 1438        0 mEq        *Administration not included in total        Sedation Time  Sedation Time Physician-1: 40 minutes 13 seconds  Coronary Findings  Diagnostic Dominance: Right Left Main  Minimal disease.  Left Anterior Descending  40% ostial LAD stenosis, 75% mid LAD stenosis.  Ramus Intermedius  There was a large ramus with 70% proximal stenosis. After this lesion, the ramus bifurcates into 2 moderate branches. The superior branch has 70-80% proximal stenosis, and the inferior branch has long, 95% proximal stenosis.  Left Circumflex  The mid LCx has a 90% stenosis followed by the take-off of OM1 then a 70% stenosis.  Right Coronary Artery  Heavy calcification at the RCA ostium but no more than 30% stenosis. Early bifurcation of the RCA  into PLV and PDA, the PLV branch is totally occluded with L=>R collaterals.  Intervention  No interventions have been documented. Right Heart  Right Heart Pressures [facility] Procedural Findings: Hemodynamics (mmHg) RA 13 RV 79/15 PA 78/26, mean 48 PCWP mean 26 with v waves to 47 LV 150/13 AO 166/68  Oxygen saturations: PA 66% AO 100%  Cardiac Output (Fick) 3.5  Cardiac Index (Fick) 2.0 PVR 6.3 WU  Implants   No implant documentation for this case.  Syngo Images  Show images for CARDIAC CATHETERIZATION  Images on Long Term Storage  Show images for Aalivia, Hanser to Procedure Log  Procedure Log  Hemo Data   Most Recent Value  Fick Cardiac Output 3.54 L/min  Fick Cardiac Output Index 1.98 (L/min)/BSA  RA A Wave 16 mmHg  RA V Wave 16 mmHg  RA Mean 13 mmHg  RV Systolic Pressure 79 mmHg  RV Diastolic Pressure 8 mmHg  RV EDP 15 mmHg  PA Systolic Pressure 78 mmHg  PA Diastolic Pressure 26 mmHg  PA Mean 48 mmHg  PW A Wave 23 mmHg  PW V Wave 47 mmHg  PW Mean 26 mmHg  AO Systolic Pressure [id number] mmHg  AO Diastolic Pressure 68 mmHg  AO Mean 123456 mmHg  LV Systolic Pressure 123XX123 mmHg  LV Diastolic Pressure 7 mmHg  LV EDP 11 mmHg  AOp Systolic Pressure 0000000 mmHg  AOp Diastolic Pressure 69 mmHg  AOp Mean Pressure [id number] mmHg  LVp Systolic Pressure Q000111Q mmHg  LVp Diastolic Pressure 5 mmHg  LVp EDP Pressure 13 mmHg  QP/QS 1  TPVR Index 24.19 HRUI  TSVR Index 62.48 HRUI  PVR SVR Ratio 0.2  TPVR/TSVR Ratio 0.39     STS Risk Calculator  Procedure: MVR + CAB Risk of Mortality: 19.417% Renal Failure: 11.851% Permanent Stroke: 5.566% Prolonged Ventilation: 40.041% DSW Infection: 0.290% Reoperation: 4.194% Morbidity or Mortality: 48.183% Short Length of Stay: 3.745% Long Length of Stay: 42.648%     Impression:  Patient has stage D severe symptomatic primary mitral regurgitation and severe multivessel coronary artery disease.  She presents with a  long history of chronic diastolic congestive heart failure which has progressed recently, currently New York Heart Association functional class IIIb.  I have personally reviewed the patient's recent transesophageal echocardiogram and diagnostic cardiac catheterization.  Patient may have underlying rheumatic heart disease.  There is type IIIa mitral valve dysfunction with severe restricted mobility involving the posterior leaflet of the mitral valve.  Both leaflets are thickened and the posterior leaflet appears essentially immobile.  There is severe calcification of the posterior mitral annulus.  The subvalvular apparatus may be a little bit thickened and foreshortened.  There appears to be mild mitral stenosis.  I am skeptical that the patient's mitral valve could be repairable and suspect it would need to be replaced.  Left ventricular systolic function remains normal.  There is severe pulmonary hypertension with mild right ventricular chamber enlargement, mild right ventricular systolic dysfunction, and moderate tricuspid regurgitation.  Diagnostic cardiac catheterization confirmed the presence of severe pulmonary hypertension and also revealed severe multivessel coronary artery disease.  There is severe calcification of the posterior mitral annulus.  Concomitant tricuspid valve repair may be necessary.  Risks associated with conventional surgical mitral valve replacement and coronary artery bypass grafting would unquestionably be very high.  Risks of surgery and the patient's postoperative convalescence will be further adversely affected by the patient's significant physical deconditioning and recent exacerbation of congestive heart failure.  Prognosis with long-term medical therapy would be quite poor.  I do not feel the patient's mitral regurgitation could be treated using transcatheter edge-to-edge repair.  At present transcatheter mitral valve replacement is not available for commercial use, and given the  patient's complex past medical history I am skeptical that the patient would be considered a candidate for enrollment in any of the ongoing trials that might be available at tertiary care centers where transcatheter mitral valve replacement is actively under investigation.     Plan:  The patient and her husband and one of her sons were counseled at length regarding treatment alternatives for management of severe symptomatic mitral regurgitation  and multivessel coronary artery disease.  Alternative approaches such as conventional surgical mitral valve repair or replacement with coronary artery bypass grafting and continued medical therapy without intervention were compared and contrasted at length.  The relative lack of suitable less invasive options for treatment was explained.  The high risks associated with conventional surgery were discussed in detail, as were expectations for post-operative convalescence, and why I remain somewhat reluctant to consider this patient a candidate for conventional surgery.  However, we also discussed why it may remain reasonable to consider high risk surgical intervention because of the relative lack of suitable less invasive approaches for treatment and the patient's poor prognosis with long-term medical therapy.  Prior to making any definitive plans the patient will be referred for a formal physical therapy consult to assess her baseline physical mobility and whether or not she might benefit from physical therapy to give her a chance to get stronger and improve her mobility with respect to her recent injury.  We will discuss with Dr. Aundra Dubin and other team members from the advanced heart failure team whether or not it would make sense for the patient to be hospitalized prior to surgery for aggressive inpatient medical therapy to treat her congestive heart failure.  The patient needs to see her dentist for routine examination and cleaning prior to surgery.  The patient will  return to our office in approximately 2 to 3 weeks to discuss treatment options further, review the results of her physical therapy evaluation, and assess her continued recovery from her recent fall.   I spent in excess of 90 minutes during the conduct of this office consultation and >50% of this time involved direct face-to-face encounter with the patient for counseling and/or coordination of their care.    Valentina Gu. Roxy Manns, MD 08/05/2019 1:46 PM

## 2019-08-05 NOTE — Telephone Encounter (Signed)
Left pt message to add on 8/11 @ 220, asked pt to call back to confirm appt

## 2019-08-06 ENCOUNTER — Other Ambulatory Visit: Payer: Self-pay | Admitting: *Deleted

## 2019-08-06 ENCOUNTER — Emergency Department (HOSPITAL_COMMUNITY): Payer: Medicare Other

## 2019-08-06 ENCOUNTER — Encounter (HOSPITAL_COMMUNITY): Payer: Self-pay | Admitting: Cardiology

## 2019-08-06 ENCOUNTER — Telehealth: Payer: Self-pay

## 2019-08-06 ENCOUNTER — Inpatient Hospital Stay (HOSPITAL_COMMUNITY): Payer: Medicare Other

## 2019-08-06 ENCOUNTER — Inpatient Hospital Stay (HOSPITAL_COMMUNITY)
Admission: EM | Admit: 2019-08-06 | Discharge: 2019-08-12 | DRG: 956 | Disposition: A | Discharge status: Home Health | Payer: Medicare Other | Attending: Internal Medicine | Admitting: Internal Medicine

## 2019-08-06 ENCOUNTER — Encounter (HOSPITAL_COMMUNITY): Payer: Self-pay

## 2019-08-06 ENCOUNTER — Telehealth (HOSPITAL_COMMUNITY): Payer: Self-pay

## 2019-08-06 ENCOUNTER — Other Ambulatory Visit: Payer: Self-pay

## 2019-08-06 ENCOUNTER — Ambulatory Visit (HOSPITAL_BASED_OUTPATIENT_CLINIC_OR_DEPARTMENT_OTHER)
Admission: RE | Admit: 2019-08-06 | Discharge: 2019-08-06 | Disposition: A | Discharge status: Home / Self Care | Payer: Medicare Other | Source: Ambulatory Visit | Attending: Cardiology | Admitting: Cardiology

## 2019-08-06 VITALS — BP 136/91 | HR 75

## 2019-08-06 DIAGNOSIS — I2510 Atherosclerotic heart disease of native coronary artery without angina pectoris: Secondary | ICD-10-CM | POA: Diagnosis present

## 2019-08-06 DIAGNOSIS — M25552 Pain in left hip: Secondary | ICD-10-CM | POA: Diagnosis present

## 2019-08-06 DIAGNOSIS — I272 Pulmonary hypertension, unspecified: Secondary | ICD-10-CM

## 2019-08-06 DIAGNOSIS — I480 Paroxysmal atrial fibrillation: Secondary | ICD-10-CM

## 2019-08-06 DIAGNOSIS — I130 Hypertensive heart and chronic kidney disease with heart failure and stage 1 through stage 4 chronic kidney disease, or unspecified chronic kidney disease: Secondary | ICD-10-CM | POA: Diagnosis present

## 2019-08-06 DIAGNOSIS — Z96652 Presence of left artificial knee joint: Secondary | ICD-10-CM | POA: Diagnosis present

## 2019-08-06 DIAGNOSIS — Z8673 Personal history of transient ischemic attack (TIA), and cerebral infarction without residual deficits: Secondary | ICD-10-CM

## 2019-08-06 DIAGNOSIS — I081 Rheumatic disorders of both mitral and tricuspid valves: Secondary | ICD-10-CM | POA: Diagnosis present

## 2019-08-06 DIAGNOSIS — S72002A Fracture of unspecified part of neck of left femur, initial encounter for closed fracture: Secondary | ICD-10-CM

## 2019-08-06 DIAGNOSIS — S0003XA Contusion of scalp, initial encounter: Secondary | ICD-10-CM | POA: Diagnosis present

## 2019-08-06 DIAGNOSIS — S32810A Multiple fractures of pelvis with stable disruption of pelvic ring, initial encounter for closed fracture: Secondary | ICD-10-CM | POA: Diagnosis present

## 2019-08-06 DIAGNOSIS — Z8349 Family history of other endocrine, nutritional and metabolic diseases: Secondary | ICD-10-CM | POA: Insufficient documentation

## 2019-08-06 DIAGNOSIS — I739 Peripheral vascular disease, unspecified: Secondary | ICD-10-CM | POA: Insufficient documentation

## 2019-08-06 DIAGNOSIS — S72002D Fracture of unspecified part of neck of left femur, subsequent encounter for closed fracture with routine healing: Secondary | ICD-10-CM | POA: Diagnosis not present

## 2019-08-06 DIAGNOSIS — Z7901 Long term (current) use of anticoagulants: Secondary | ICD-10-CM | POA: Insufficient documentation

## 2019-08-06 DIAGNOSIS — Y92015 Private garage of single-family (private) house as the place of occurrence of the external cause: Secondary | ICD-10-CM | POA: Diagnosis not present

## 2019-08-06 DIAGNOSIS — Z96642 Presence of left artificial hip joint: Secondary | ICD-10-CM

## 2019-08-06 DIAGNOSIS — Z79890 Hormone replacement therapy (postmenopausal): Secondary | ICD-10-CM

## 2019-08-06 DIAGNOSIS — Z79899 Other long term (current) drug therapy: Secondary | ICD-10-CM | POA: Diagnosis not present

## 2019-08-06 DIAGNOSIS — Z87442 Personal history of urinary calculi: Secondary | ICD-10-CM

## 2019-08-06 DIAGNOSIS — I2721 Secondary pulmonary arterial hypertension: Secondary | ICD-10-CM | POA: Insufficient documentation

## 2019-08-06 DIAGNOSIS — I5032 Chronic diastolic (congestive) heart failure: Secondary | ICD-10-CM

## 2019-08-06 DIAGNOSIS — S72032A Displaced midcervical fracture of left femur, initial encounter for closed fracture: Secondary | ICD-10-CM | POA: Diagnosis present

## 2019-08-06 DIAGNOSIS — E669 Obesity, unspecified: Secondary | ICD-10-CM | POA: Diagnosis present

## 2019-08-06 DIAGNOSIS — D62 Acute posthemorrhagic anemia: Secondary | ICD-10-CM | POA: Diagnosis not present

## 2019-08-06 DIAGNOSIS — Z951 Presence of aortocoronary bypass graft: Secondary | ICD-10-CM | POA: Insufficient documentation

## 2019-08-06 DIAGNOSIS — I10 Essential (primary) hypertension: Secondary | ICD-10-CM | POA: Diagnosis not present

## 2019-08-06 DIAGNOSIS — J9601 Acute respiratory failure with hypoxia: Secondary | ICD-10-CM | POA: Diagnosis present

## 2019-08-06 DIAGNOSIS — N184 Chronic kidney disease, stage 4 (severe): Secondary | ICD-10-CM | POA: Diagnosis present

## 2019-08-06 DIAGNOSIS — E785 Hyperlipidemia, unspecified: Secondary | ICD-10-CM | POA: Diagnosis present

## 2019-08-06 DIAGNOSIS — W19XXXA Unspecified fall, initial encounter: Secondary | ICD-10-CM

## 2019-08-06 DIAGNOSIS — S32592A Other specified fracture of left pubis, initial encounter for closed fracture: Secondary | ICD-10-CM | POA: Diagnosis present

## 2019-08-06 DIAGNOSIS — I340 Nonrheumatic mitral (valve) insufficiency: Secondary | ICD-10-CM

## 2019-08-06 DIAGNOSIS — Z419 Encounter for procedure for purposes other than remedying health state, unspecified: Secondary | ICD-10-CM

## 2019-08-06 DIAGNOSIS — Z6828 Body mass index (BMI) 28.0-28.9, adult: Secondary | ICD-10-CM

## 2019-08-06 DIAGNOSIS — S32119A Unspecified Zone I fracture of sacrum, initial encounter for closed fracture: Secondary | ICD-10-CM | POA: Diagnosis present

## 2019-08-06 DIAGNOSIS — N179 Acute kidney failure, unspecified: Secondary | ICD-10-CM

## 2019-08-06 DIAGNOSIS — W010XXA Fall on same level from slipping, tripping and stumbling without subsequent striking against object, initial encounter: Secondary | ICD-10-CM | POA: Diagnosis present

## 2019-08-06 DIAGNOSIS — N183 Chronic kidney disease, stage 3 unspecified: Secondary | ICD-10-CM

## 2019-08-06 DIAGNOSIS — N189 Chronic kidney disease, unspecified: Secondary | ICD-10-CM | POA: Diagnosis not present

## 2019-08-06 DIAGNOSIS — Z90711 Acquired absence of uterus with remaining cervical stump: Secondary | ICD-10-CM

## 2019-08-06 DIAGNOSIS — Z20828 Contact with and (suspected) exposure to other viral communicable diseases: Secondary | ICD-10-CM | POA: Diagnosis present

## 2019-08-06 DIAGNOSIS — Z8249 Family history of ischemic heart disease and other diseases of the circulatory system: Secondary | ICD-10-CM

## 2019-08-06 DIAGNOSIS — N170 Acute kidney failure with tubular necrosis: Secondary | ICD-10-CM | POA: Diagnosis present

## 2019-08-06 DIAGNOSIS — I25810 Atherosclerosis of coronary artery bypass graft(s) without angina pectoris: Secondary | ICD-10-CM | POA: Insufficient documentation

## 2019-08-06 DIAGNOSIS — I5033 Acute on chronic diastolic (congestive) heart failure: Secondary | ICD-10-CM

## 2019-08-06 DIAGNOSIS — I700 Atherosclerosis of aorta: Secondary | ICD-10-CM | POA: Diagnosis present

## 2019-08-06 DIAGNOSIS — I052 Rheumatic mitral stenosis with insufficiency: Secondary | ICD-10-CM | POA: Diagnosis present

## 2019-08-06 DIAGNOSIS — E039 Hypothyroidism, unspecified: Secondary | ICD-10-CM | POA: Diagnosis present

## 2019-08-06 LAB — CBC WITH DIFFERENTIAL/PLATELET
Abs Immature Granulocytes: 0.11 10*3/uL — ABNORMAL HIGH (ref 0.00–0.07)
Basophils Absolute: 0.1 10*3/uL (ref 0.0–0.1)
Basophils Relative: 0 %
Eosinophils Absolute: 0.1 10*3/uL (ref 0.0–0.5)
Eosinophils Relative: 1 %
HCT: 47 % — ABNORMAL HIGH (ref 36.0–46.0)
Hemoglobin: 14.8 g/dL (ref 12.0–15.0)
Immature Granulocytes: 1 %
Lymphocytes Relative: 15 %
Lymphs Abs: 2.5 10*3/uL (ref 0.7–4.0)
MCH: 31.8 pg (ref 26.0–34.0)
MCHC: 31.5 g/dL (ref 30.0–36.0)
MCV: 100.9 fL — ABNORMAL HIGH (ref 80.0–100.0)
Monocytes Absolute: 1.2 10*3/uL — ABNORMAL HIGH (ref 0.1–1.0)
Monocytes Relative: 7 %
Neutro Abs: 12.2 10*3/uL — ABNORMAL HIGH (ref 1.7–7.7)
Neutrophils Relative %: 76 %
Platelets: 211 10*3/uL (ref 150–400)
RBC: 4.66 MIL/uL (ref 3.87–5.11)
RDW: 16.6 % — ABNORMAL HIGH (ref 11.5–15.5)
WBC: 16.1 10*3/uL — ABNORMAL HIGH (ref 4.0–10.5)
nRBC: 0 % (ref 0.0–0.2)

## 2019-08-06 LAB — BASIC METABOLIC PANEL
Anion gap: 14 (ref 5–15)
Anion gap: 13 (ref 5–15)
BUN: 32 mg/dL — ABNORMAL HIGH (ref 8–23)
BUN: 34 mg/dL — ABNORMAL HIGH (ref 8–23)
CO2: 23 mmol/L (ref 22–32)
CO2: 25 mmol/L (ref 22–32)
Calcium: 9.4 mg/dL (ref 8.9–10.3)
Calcium: 9.2 mg/dL (ref 8.9–10.3)
Chloride: 105 mmol/L (ref 98–111)
Chloride: 103 mmol/L (ref 98–111)
Creatinine, Ser: 1.62 mg/dL — ABNORMAL HIGH (ref 0.44–1.00)
Creatinine, Ser: 1.66 mg/dL — ABNORMAL HIGH (ref 0.44–1.00)
GFR calc Af Amer: 35 mL/min — ABNORMAL LOW (ref >60)
GFR calc Af Amer: 34 mL/min — ABNORMAL LOW (ref >60)
GFR calc non Af Amer: 30 mL/min — ABNORMAL LOW (ref >60)
GFR calc non Af Amer: 29 mL/min — ABNORMAL LOW (ref >60)
Glucose, Bld: 122 mg/dL — ABNORMAL HIGH (ref 70–99)
Glucose, Bld: 113 mg/dL — ABNORMAL HIGH (ref 70–99)
Potassium: 3.4 mmol/L — ABNORMAL LOW (ref 3.5–5.1)
Potassium: 4.8 mmol/L (ref 3.5–5.1)
Sodium: 142 mmol/L (ref 135–145)
Sodium: 141 mmol/L (ref 135–145)

## 2019-08-06 LAB — TYPE AND SCREEN
ABO/RH(D): A POS
Antibody Screen: NEGATIVE

## 2019-08-06 LAB — ABO/RH: ABO/RH(D): A POS

## 2019-08-06 MED ORDER — ONDANSETRON HCL 4 MG/2ML IJ SOLN
4.0000 mg | Freq: Once | INTRAMUSCULAR | Status: AC
  Administered 2019-08-06: 18:00:00 4 mg via INTRAVENOUS
  Filled 2019-08-06: qty 2

## 2019-08-06 MED ORDER — ROSUVASTATIN CALCIUM 20 MG PO TABS: 20.0000 mg | ORAL_TABLET | Freq: Every day | ORAL | 3 refills | Status: AC

## 2019-08-06 MED ORDER — FUROSEMIDE 40 MG PO TABS: 60.0000 mg | ORAL_TABLET | Freq: Two times a day (BID) | ORAL | 3 refills | Status: AC

## 2019-08-06 MED ORDER — HYDROCODONE-ACETAMINOPHEN 5-325 MG PO TABS
1.0000 | ORAL_TABLET | Freq: Four times a day (QID) | ORAL | Status: DC | PRN
  Administered 2019-08-06 – 2019-08-08 (×6): 2 via ORAL
  Filled 2019-08-06 (×6): qty 2

## 2019-08-06 MED ORDER — LEVOTHYROXINE SODIUM 112 MCG PO TABS
112.0000 ug | ORAL_TABLET | Freq: Every day | ORAL | Status: DC
  Administered 2019-08-07 – 2019-08-12 (×6): 112 ug via ORAL
  Filled 2019-08-06 (×6): qty 1

## 2019-08-06 MED ORDER — SENNOSIDES-DOCUSATE SODIUM 8.6-50 MG PO TABS
1.0000 | ORAL_TABLET | Freq: Every evening | ORAL | Status: DC | PRN
  Administered 2019-08-10: 07:00:00 1 via ORAL
  Filled 2019-08-06: qty 1

## 2019-08-06 MED ORDER — FUROSEMIDE 10 MG/ML IJ SOLN
40.0000 mg | Freq: Two times a day (BID) | INTRAMUSCULAR | Status: DC
  Administered 2019-08-07: 40 mg via INTRAVENOUS
  Filled 2019-08-06: qty 4

## 2019-08-06 MED ORDER — METOPROLOL TARTRATE 50 MG PO TABS
50.0000 mg | ORAL_TABLET | Freq: Two times a day (BID) | ORAL | Status: DC
  Administered 2019-08-07 – 2019-08-12 (×11): 50 mg via ORAL
  Filled 2019-08-06 (×11): qty 1

## 2019-08-06 MED ORDER — LISINOPRIL 20 MG PO TABS: 40.0000 mg | ORAL_TABLET | Freq: Every day | ORAL | Status: DC

## 2019-08-06 MED ORDER — SPIRONOLACTONE 12.5 MG HALF TABLET: 12.5000 mg | ORAL_TABLET | Freq: Every day | ORAL | Status: DC

## 2019-08-06 MED ORDER — MORPHINE SULFATE (PF) 2 MG/ML IV SOLN
0.5000 mg | INTRAVENOUS | Status: DC | PRN
  Administered 2019-08-07 (×3): 0.5 mg via INTRAVENOUS
  Filled 2019-08-06 (×2): qty 1

## 2019-08-06 MED ORDER — MORPHINE SULFATE (PF) 4 MG/ML IV SOLN
4.0000 mg | Freq: Once | INTRAVENOUS | Status: AC
  Administered 2019-08-06: 18:00:00 4 mg via INTRAVENOUS
  Filled 2019-08-06: qty 1

## 2019-08-06 MED ORDER — ACETAMINOPHEN 500 MG PO TABS
1000.0000 mg | ORAL_TABLET | Freq: Every day | ORAL | Status: DC | PRN
  Administered 2019-08-08: 09:00:00 1000 mg via ORAL
  Filled 2019-08-06: qty 2

## 2019-08-06 MED ORDER — SPIRONOLACTONE 25 MG PO TABS: 12.5000 mg | ORAL_TABLET | Freq: Every day | ORAL | 3 refills | Status: AC

## 2019-08-06 MED ORDER — ROSUVASTATIN CALCIUM 20 MG PO TABS
20.0000 mg | ORAL_TABLET | Freq: Every day | ORAL | Status: DC
  Administered 2019-08-07 – 2019-08-12 (×6): 20 mg via ORAL
  Filled 2019-08-06 (×6): qty 1

## 2019-08-06 MED ORDER — FUROSEMIDE 40 MG PO TABS: 60.0000 mg | ORAL_TABLET | Freq: Two times a day (BID) | ORAL | Status: DC

## 2019-08-06 NOTE — Patient Instructions (Signed)
Lab work done today. We will notify you of any abnormal lab work. No news is good news.  Lab work will need to be done again in 1 weeks.  STOP Zocor  START Crestor 20mg  daily.  INCREASE Furosemide to 60mg  (1.5 tabs) two times daily.  START Spironolactone 12.5mg  (0.5 tab) daily  Please keep follow up appointment.  Please follow up with the Emergency Department for your fall today.  At the [facility], you and your health needs are our priority. As part of our continuing mission to provide you with exceptional heart care, we have created designated Provider Care Teams. These Care Teams include your primary Cardiologist (physician) and Advanced Practice Providers (APPs- Physician Assistants and Nurse Practitioners) who all work together to provide you with the care you need, when you need it.   You may see any of the following providers on your designated Care Team at your next follow up: Marland Kitchen Dr Glori Bickers . Dr Loralie Champagne . Darrick Grinder, NP   Please be sure to bring in all your medications bottles to every appointment.

## 2019-08-06 NOTE — ED Notes (Signed)
Pt back from CT

## 2019-08-06 NOTE — Telephone Encounter (Signed)
Encounter complete. 

## 2019-08-06 NOTE — Progress Notes (Signed)
Pt fell while in clinic. Pt was assisted in a wheelchair in order to obtain her weight. Pt had son present at fall. The patient went to step up from the wheelchair and lost her footing when she went to stand. Pt blamed it on a error with her shoe. Pt fell on left hip and hit the left side of her head. Pt is on eliquis 5mg  bid. md notified. Vitals obtained. bp elevated initialy at 162/118. Pt helped back into wheelchair and given ice packs for head and hip. Pt denies losing consciousness. Pt alert and oriented. Pt later obtained 136/91. All other vitals stable. md assessed pt and advised pt to go to the ED. Pt wheeled to ED by CMA.

## 2019-08-06 NOTE — ED Notes (Signed)
Patient transported to CT 

## 2019-08-06 NOTE — Progress Notes (Signed)
Consult received for displaced L femoral neck fracture. X-rays also showed subacute pelvic ring fracture. Request hospitalist admission at [facility]. Will require arthroplasty after 48 hours due to Eliquis.  Needs CT pelvis for further eval of pelvic ring injury. Will see patient tomorrow. Plan for surgery Thursday. Hold Eliquis.

## 2019-08-06 NOTE — ED Notes (Signed)
Report given to felicia RN

## 2019-08-06 NOTE — Telephone Encounter (Signed)
Patient contacted per Dr. Guy Sandifer request and advised she must see her dentist before valve surgery.  Patient stated she would get an appointment to see her dentist.  If she was unable to make an appointment, she would contact our office back to be referred to Dr. Enrique Sack per Dr. Guy Sandifer request.

## 2019-08-06 NOTE — ED Triage Notes (Signed)
Pt arrives POV for eval of L hip pain and head pain s/p fall this AM at MD office. Pt had mechanical fall this morning, small bump noted to L forehead. Pt also endorses L hip pain, worse on standing. Denies LOC, denies vision changes/N/V. GCS 15. Anticoagulated on eliquis

## 2019-08-06 NOTE — ED Provider Notes (Signed)
[facility] EMERGENCY DEPARTMENT Provider Note   CSN: [id number] Arrival date & time: 08/06/19  1533     History   Chief Complaint Chief Complaint  Patient presents with   Hip Pain   Fall    HPI Caroline [name] is a 78 y.o. female.     HPI Patient presents to the emergency department with injuries following a fall that occurred this afternoon.  The patient was in her cardiologist office when she is tripped getting on the scale.  She fell injuring her left hip.  She states she did hit her head as well.  Patient states that certain movements palpation make the pain worse.  The patient denies chest pain, shortness of breath, headache,blurred vision, neck pain, fever, cough, weakness, numbness, dizziness, anorexia, edema, abdominal pain, nausea, vomiting, diarrhea, rash, back pain, dysuria, hematemesis, bloody stool, near syncope, or syncope. Past Medical History:  Diagnosis Date   Arthritis    CAD    Chronic diastolic CHF (congestive heart failure) (HCC)    CKD (chronic kidney disease), stage IV (HCC)    coronary artery disease    History of kidney stones 01/2017   Hypertension    Hypothyroidism    Mitral regurgitation and mitral stenosis    PAF (paroxysmal atrial fibrillation) ([place])    a. On Eliquis; b. 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   Bilateral carotid artery stenosis 08/14/2017   Paroxysmal atrial fibrillation ([place]) 07/14/2017   Cerebrovascular  accident (CVA) ([place]) 06/07/2017   Osteoarthritis of knee 04/21/2017   TIA (transient ischemic attack) 10/18/2016   Hypothyroidism, unspecified 09/25/2016   Essential hypertension 09/25/2016   Knee pain 09/07/2016   Carotid stenosis 02/17/2014   HTN (hypertension) 02/17/2014   Mixed hyperlipidemia 02/17/2014   Coronary disease 02/17/2014   Abnormal echocardiogram 02/17/2014    Past Surgical History:  Procedure Laterality Date   ABDOMINAL HYSTERECTOMY     CARDIAC CATHETERIZATION     Negative    COLONOSCOPY  2010   PARTIAL HYSTERECTOMY     RIGHT/LEFT HEART CATH AND CORONARY ANGIOGRAPHY N/A 08/02/2019   Procedure: RIGHT/LEFT HEART CATH AND CORONARY ANGIOGRAPHY;  Surgeon: Larey Dresser, MD;  Location: [facility] CV LAB;  Service: Cardiovascular;  Laterality: N/A;   TEE WITHOUT CARDIOVERSION N/A 08/02/2019   Procedure: TRANSESOPHAGEAL ECHOCARDIOGRAM (TEE);  Surgeon: Larey Dresser, MD;  Location: [facility] ENDOSCOPY;  Service: Cardiovascular;  Laterality: N/A;   TOTAL KNEE ARTHROPLASTY Left 11/28/2017   Procedure: TOTAL KNEE ARTHROPLASTY;  Surgeon: Thornton Park, MD;  Location: [facility] ORS;  Service: Orthopedics;  Laterality: Left;     OB History    Gravida  3   Para      Term      Preterm      AB      Living        SAB      TAB      Ectopic      Multiple  Live Births  3        Obstetric Comments  1st Menstrual Cycle:  1st Pregnancy:            Home Medications    Prior to Admission medications   Medication Sig Start Date End Date Taking? Authorizing Provider  acetaminophen (TYLENOL) 500 MG tablet Take 1,000 mg by mouth daily as needed (for pain.).    [provider]  allopurinol (ZYLOPRIM) 100 MG tablet Take 100 mg by mouth daily.  09/08/16   [provider]  cyanocobalamin (,VITAMIN B-12,) 1000 MCG/ML injection Inject 1,000 mcg into the muscle every 30 (thirty) days.    [provider]  ELIQUIS 5 MG TABS tablet  Take 1 tablet by mouth twice daily 05/07/19   Minna Merritts, MD  furosemide (LASIX) 40 MG tablet Take 1.5 tablets (60 mg total) by mouth 2 (two) times daily. 08/06/19 08/05/20  Larey Dresser, MD  levothyroxine (SYNTHROID, LEVOTHROID) 112 MCG tablet Take 112 mcg by mouth daily before breakfast. 09/05/17   [provider]  lisinopril (PRINIVIL,ZESTRIL) 40 MG tablet Take 1 tablet (40 mg total) by mouth daily. 01/09/18   Minna Merritts, MD  metoprolol tartrate (LOPRESSOR) 50 MG tablet Take 1 tablet (50 mg total) by mouth 2 (two) times daily. 01/01/19   Dunn, Areta Haber, PA-C  rosuvastatin (CRESTOR) 20 MG tablet Take 1 tablet (20 mg total) by mouth daily. 08/06/19 11/04/19  Larey Dresser, MD  spironolactone (ALDACTONE) 25 MG tablet Take 0.5 tablets (12.5 mg total) by mouth daily. 08/06/19 11/04/19  Larey Dresser, MD    Family History Family History  Problem Relation Age of Onset   Heart attack Father 61   Hypertension Father    Hyperlipidemia Father    Heart disease Brother        valvular disease     Social History Social History   Tobacco Use   Smoking status: Never Smoker   Smokeless tobacco: Never Used  Substance Use Topics   Alcohol use: Yes    Alcohol/week: 1.0 standard drinks    Types: 1 Glasses of wine per week    Comment: social    Drug use: No     Allergies   Patient has no known allergies.   Review of Systems Review of Systems All other systems negative except as documented in the HPI. All pertinent positives and negatives as reviewed in the HPI.  Physical Exam Updated Vital Signs BP (!) 135/93    Pulse 92    Temp 98.3 F (36.8 C) (Oral)    Resp 18    Ht 5\' 2"  (1.575 m)    Wt 71.7 kg    SpO2 96%    BMI 28.90 kg/m   Physical Exam Vitals signs and nursing note reviewed.  Constitutional:      General: She is not in acute distress.    Appearance: She is well-developed.  HENT:     Head: Normocephalic and atraumatic.  Eyes:     Pupils: Pupils  are equal, round, and reactive to light.  Neck:     Musculoskeletal: Normal range of motion and neck supple.  Cardiovascular:     Rate and Rhythm: Normal rate and regular rhythm.     Heart sounds: Normal heart sounds. No murmur. No friction rub. No gallop.   Pulmonary:     Effort: Pulmonary effort is normal. No respiratory distress.     Breath sounds: Normal breath sounds. No wheezing.  Musculoskeletal:  Left hip: She exhibits decreased range of motion, tenderness and bony tenderness.  Skin:    General: Skin is warm and dry.     Capillary Refill: Capillary refill takes less than 2 seconds.     Findings: No erythema or rash.  Neurological:     Mental Status: She is alert and oriented to person, place, and time.     Motor: No abnormal muscle tone.     Coordination: Coordination normal.  Psychiatric:        Behavior: Behavior normal.      ED Treatments / Results  Labs (all labs ordered are listed, but only abnormal results are displayed) Labs Reviewed  BASIC METABOLIC PANEL - Abnormal; Notable for the following components:      Result Value   Glucose, Bld 113 (*)    BUN 34 (*)    Creatinine, Ser 1.66 (*)    GFR calc non Af Amer 29 (*)    GFR calc Af Amer 34 (*)    All other components within normal limits  CBC WITH DIFFERENTIAL/PLATELET - Abnormal; Notable for the following components:   WBC 16.1 (*)    HCT 47.0 (*)    MCV 100.9 (*)    RDW 16.6 (*)    Neutro Abs 12.2 (*)    Monocytes Absolute 1.2 (*)    Abs Immature Granulocytes 0.11 (*)    All other components within normal limits    EKG None  Radiology Dg Chest 1 View  Result Date: 08/06/2019 CLINICAL DATA:  Fall, left hip pain. History hypertension, CAD, CHF, mitral valve disease EXAM: CHEST  1 VIEW COMPARISON:  Most recent radiograph 12/20/2018, CT chest 12/11/2017 FINDINGS: Cardiomegaly with a calcified tortuous aorta similar to prior studies. No consolidation, features of edema, pneumothorax, or effusion.  Pulmonary vascularity is normally distributed. No acute osseous or soft tissue abnormality. Degenerative changes are present in the imaged spine and shoulders. Vascular calcium at the carotid bifurcation on the left. IMPRESSION: No acute cardiopulmonary abnormality or acute traumatic finding in the chest Electronically Signed   By: Lovena Le M.D.   On: 08/06/2019 16:44   Ct Head Wo Contrast  Result Date: 08/06/2019 CLINICAL DATA:  Recent fall with headaches, initial encounter EXAM: CT HEAD WITHOUT CONTRAST TECHNIQUE: Contiguous axial images were obtained from the base of the skull through the vertex without intravenous contrast. COMPARISON:  06/06/2017 FINDINGS: Brain: Mild chronic white matter ischemic changes noted. No findings to suggest acute hemorrhage, acute infarction or space-occupying mass lesion. Vascular: No hyperdense vessel or unexpected calcification. Skull: Normal. Negative for fracture or focal lesion. Sinuses/Orbits: No acute finding. Other: Mild scalp hematoma in the left frontal region consistent with the recent injury. IMPRESSION: Scalp hematoma without acute intracranial abnormality. Electronically Signed   By: Inez Catalina M.D.   On: 08/06/2019 17:41   Dg Hip Unilat With Pelvis 2-3 Views Left  Result Date: 08/06/2019 CLINICAL DATA:  Fall, unable to move left leg EXAM: DG HIP (WITH OR WITHOUT PELVIS) 2-3V LEFT COMPARISON:  None. FINDINGS: Foreshortened transcervical left femoral neck fracture. Additional fractures of left superior and inferior pubic rami as well as disruption of the left sacral arcuate lines suggesting a left sacral fracture as well with hazy periosteal reaction suggesting possible callus formation. No other fracture or traumatic malalignment is seen. The lower sacrum is poorly visualized due to overlying bowel gas the SI joints and symphysis pubis remain congruent. Degenerative changes are present in both hips and within the lower lumbar  spine. Vascular calcium is  seen in the pelvis. IMPRESSION: 1. Foreshortened transcervical left femoral neck fracture. 2. Left superior and inferior pubic rami fractures and probable left sacral fracture. More age indeterminate given suggestion of callus formation. 3. Diffuse bone demineralization. Electronically Signed   By: Lovena Le M.D.   On: 08/06/2019 16:42    Procedures Procedures (including critical care time)  Medications Ordered in ED Medications  morphine 4 MG/ML injection 4 mg (4 mg Intravenous Given 08/06/19 1803)  ondansetron (ZOFRAN) injection 4 mg (4 mg Intravenous Given 08/06/19 1802)     Initial Impression / Assessment and Plan / ED Course  I have reviewed the triage vital signs and the nursing notes.  Pertinent labs & imaging results that were available during my care of the patient were reviewed by me and considered in my medical decision making (see chart for details).        Spoke with Dr. Lyla Glassing [facility].  He would like to have the patient admitted to [facility] and he plans to perform her surgery on Thursday.  Patient has been stable here in the emergency department she is advised the plan and all questions were answered. Final Clinical Impressions(s) / ED Diagnoses   Final diagnoses:  Fall    ED Discharge Orders    None       Dalia Heading, Hershal Coria 08/06/19 1847    Margette Fast, MD 08/06/19 2008

## 2019-08-06 NOTE — Progress Notes (Signed)
PCP: Remi Haggard, FNP Cardiology: Dr. [name] HF Cardiology: Loralie Champagne   78 y.o. with history of chronic diastolic CHF, mitral valve disease, paroxysmal atrial fibrillation, CKD stage 3, and PAD was referred by Dr. [name] for evaluation of CHF.  Patient reports exertional dyspnea since 2018 (after left TKR complicated by respiratory failure).  Dyspnea has been gradually worsening over time, when I initially saw her, she was short of breath walking 100-150 feet and with most moderate exertion.  No orthopnea/PND.  No chest pain. Echo in 1/20 showed normal LV EF with mild LVH, normal RV with moderate pulmonary hypertension (PASP 60 mmHg), and mild to moderate mitral stenosis.  No known history of rheumatic fever. She has paroxysmal atrial fibrillation but is generally in NSR. She has home oxygen which she only uses prn.  She never smoked. Noted to have moderate PAD by ABIs.    She had a TEE in 8/20, showing EF 55-60% with moderate TR and rheumatic-appearing mitral valve with severe mitral regurgitation and mild mitral stenosis.  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  She has seen Dr. Roxy Manns for evaluation for MVR/CABG. I have increased her diuretics.   She returns today for followup of CHF, CAD, and mitral valve disease.  Since I increased her Lasix, she has been walking more.  She denies dyspnea walking around her house.  She is sleeping in a recliner but orthopnea has improved.  Dyspnea with moderate exertion.  She denies chest pain.    While getting out of her wheelchair in the office today, she tripped and fell on her left hip.  She has significant left-sided hip pain now.   Labs (9/19): K 3.8, creatinine 1.73 Labs (8/20): LDL 131, BNP 2543, myeloma panel negative, K 4.8, creatinine 1.66  PMH: 1. PAD: ABIs (7/20) 0.53 on right and 0.47 on  left.  2. Chronic diastolic CHF: Echo (123XX123) with EF 60-65%, mild LVH, normal RV size and systolic function, PA systolic pressure 60 mmHg, mild MR, mild-moderate mitral stenosis (mean gradient 6.5 mmHg), moderate LAE.  - RHC (8/20): mean RA 13, PA 78/26 mean 48, mean PCWP 26 with v waves to 47, CI 2.0, PVR 6.3 WU.  3. Atrial fibrillation: Paroxysmal.  S/p CVA.  4. HTN 5. Chronic venous insufficiency.  6. Hypothyroidism.  7. Hyperlipidemia 8. Bilateral carotid artery disease: Followed by vascular surgeon in [place].  9. CKD: Stage 3.  10. CAD: Cardiac cath in 2005 with minimal CAD.  - LHC (8/20): 75% mid LAD, severe disease in a large branching ramus, 90% mid LCx, totally occluded PLV branch of RCA with collaterals.  11. Left TKR in 2018.  12. 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  Mild to moderate mitral stenosis on echo in 1/20. - TEE (8/20): EF 55-60% with moderate TR and rheumatic-appearing mitral valve with severe mitral regurgitation and mild mitral stenosis.  Social History   Socioeconomic History  . Marital status: Married    Spouse name: Not on file  . Number of children: Not on file  . Years of education: Not on file  . Highest education level: Not on file  Occupational History  . Occupation: retired  Scientific laboratory technician  . Financial resource strain: Not on file  . Food insecurity    Worry: Not on file    Inability: Not on file  . Transportation needs  Medical: Not on file    Non-medical: Not on file  Tobacco Use  . Smoking status: Never Smoker  . Smokeless tobacco: Never Used  Substance and Sexual Activity  . Alcohol use: Yes    Alcohol/week: 1.0 standard drinks    Types: 1 Glasses of wine per week    Comment: social   . Drug use: No  . Sexual activity: Not on file  Lifestyle  . Physical activity    Days per week: Not on file    Minutes per session: Not on file  . Stress: Not on file  Relationships  . Social Herbalist on phone: Not on  file    Gets together: Not on file    Attends religious service: Not on file    Active member of club or organization: Not on file    Attends meetings of clubs or organizations: Not on file    Relationship status: Not on file  . Intimate partner violence    Fear of current or ex partner: Not on file    Emotionally abused: Not on file    Physically abused: Not on file    Forced sexual activity: Not on file  Other Topics Concern  . Not on file  Social History Narrative  . Not on file   Family History  Problem Relation Age of Onset  . Heart attack Father 71  . Hypertension Father   . Hyperlipidemia Father   . Heart disease Brother        valvular disease    ROS: All systems reviewed and negative except as per HPI.   No current facility-administered medications for this encounter.    No current outpatient medications on file.   Facility-Administered Medications Ordered in Other Encounters  Medication Dose Route Frequency Provider Last Rate Last Dose  . acetaminophen (TYLENOL) tablet 1,000 mg  1,000 mg Oral Daily PRN Zada Finders R, MD      . albuterol (PROVENTIL) (2.5 MG/3ML) 0.083% nebulizer solution 2.5 mg  2.5 mg Nebulization Once Laverle Hobby, MD      . furosemide (LASIX) tablet 60 mg  60 mg Oral BID Zada Finders R, MD      . HYDROcodone-acetaminophen (NORCO/VICODIN) 5-325 MG per tablet 1-2 tablet  1-2 tablet Oral Q6H PRN Lenore Cordia, MD   2 tablet at 08/06/19 1954  . [START ON 08/07/2019] levothyroxine (SYNTHROID) tablet 112 mcg  112 mcg Oral QAC breakfast Lenore Cordia, MD      . Derrill Memo ON 08/07/2019] lisinopril (ZESTRIL) tablet 40 mg  40 mg Oral Daily Zada Finders R, MD      . metoprolol tartrate (LOPRESSOR) tablet 50 mg  50 mg Oral BID Zada Finders R, MD      . morphine 2 MG/ML injection 0.5 mg  0.5 mg Intravenous Q2H PRN Lenore Cordia, MD      . Derrill Memo ON 08/07/2019] rosuvastatin (CRESTOR) tablet 20 mg  20 mg Oral Daily Zada Finders R, MD      .  senna-docusate (Senokot-S) tablet 1 tablet  1 tablet Oral QHS PRN Lenore Cordia, MD      . Derrill Memo ON 08/07/2019] spironolactone (ALDACTONE) tablet 12.5 mg  12.5 mg Oral Daily Patel, Vishal R, MD       BP (!) 136/91   Pulse 75   SpO2 97%  General: NAD Neck: JVP 10 cm, no thyromegaly or thyroid nodule.  Lungs: Clear to auscultation bilaterally with normal respiratory effort. CV:  Nondisplaced PMI.  Heart regular S1/S2, no S3/S4, 3/6 HSM apex.  1+ edema 1/2 to knees bilaterally.  No carotid bruit.  Normal pedal pulses.  Abdomen: Soft, nontender, no hepatosplenomegaly, no distention.  Skin: Intact without lesions or rashes.  Neurologic: Alert and oriented x 3.  Psych: Normal affect. Extremities: No clubbing or cyanosis.  HEENT: Normal.   Assessment/Plan: 1. Chronic diastolic CHF: Echo in 123XX123 showed EF 60-65%, mild LVH, normal-appearing RV with PASP 60 mmHg, mild-moderate mitral stenosis. 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  RHC in 8/20 showed elevated right and left heart filling pressures with mixed pulmonary venous/pulmonary arterial hypertension.  She still appears volume overloaded on exam though says that her breathing is improving.  - Increase Lasix to 60 mg po bid.  BMET today and again next week.   - With LVH and prominent CHF, I think that it would be reasonable to work her up for cardiac amyloidosis.  Myeloma panel was negative.  We are awaiting PYP scan.  2. Pulmonary hypertension: Mixed pulmonary venous/pulmonary arterial hypertension on 8/20 RHC.  Suspect PAH component may be due to pulmonary vascular changes in the setting of severe MR over time.  - Treatment for now will be diuresis.   - Once she is well-diuresed, will consider sildenafil use.  3. 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  She would not be a candidate for Mitraclip or mitral  valve repair based on the morphology of the valve.  She has seen Dr. Roxy Manns with [facility], would need CABG-MV replacement. This will be a fairly high risk procedure given active CHF, pulmonary hypertension, and deconditioning.  Prior to surgery, she would need better control of CHF as well as physical therapy.  4. Atrial fibrillation: Paroxysmal.  She is in NSR today.  - Continue Eliquis - Continue metoprolol.  5. PAD: No claudication but legs fatigue easily. Recent ABIs were moderately decreased.  She will be seeing vascular surgery in [place].  6. Hyperlipidemia: Goal LDL < 70 with vascular disease, LDL was 134 when recently checked.  - Stop Zocor, start Crestor 20 mg daily with lipids/LFTs in 2 months.  7. CKD: Stage 3.  BMET today.  8. CAD: Severe 3 vessel disease on 8/20 cath.  No chest pain.  - Ideally, would have CABG with MV replacement.  - Continue statin as above.  - She is on Eliquis so not getting aspirin.  Loralie Champagne 08/06/2019   Addendum:  Patient was sent to ER after her fall.  She was found to have a left femoral neck fracture on X-ray. CT head showed no fracture or intracranial hemorrhage, only scalp hematoma.    She will need arthroplasty.  Surgery will be high risk given severe MR, severe 3 vessel coronary disease, and CHF.    I would recommend that she stay at [facility] rather than going to [facility], that way she can be managed in coordination with CHF service.   Loralie Champagne 08/06/2019

## 2019-08-06 NOTE — ED Notes (Signed)
[facility] Called 

## 2019-08-06 NOTE — H&P (Signed)
History and Physical    Caroline [name] WUJ:[id number] DOB: 1941/01/07 DOA: 08/06/2019  PCP: Remi Haggard, FNP  Patient coming from: Cardiology office  I have personally briefly reviewed patient's old medical records in [place]  Chief Complaint: Left hip pain after fall  HPI: Caroline [name] is a 78 y.o. female with medical history significant for severe three-vessel CAD, paroxysmal atrial fibrillation on Eliquis, chronic diastolic CHF, pulmonary hypertension, mitral regurgitation and mitral stenosis, history of CVA, hypertension, hyperlipidemia, hypothyroidism, and CKD stage III who presents the ED for evaluation of left hip pain after a fall.  Patient was at her cardiology office for follow-up visit and lab work.  She was standing up from her wheelchair when her shoe got caught onto the floor and she lost balance and fell down onto her left side.  She did also bump her head.  When she tried to stand up she had significant left hip pain and was unable to bear weight.  She denies any lightheadedness/dizziness, chest pain, palpitations, dyspnea, cough, nausea, vomiting, diaphoresis, abdominal pain, dysuria, or loss of consciousness.  She was sent to the ED for further evaluation.  Of note patient takes Eliquis for atrial fibrillation.  Last taken morning of 08/06/2019.  She is being evaluated for CABG and mitral valve replacement by cardiology and cardiothoracic surgery after right/left heart cath 08/02/2019 revealed severe three-vessel disease with elevated right and left heart filling pressures, severe mixed pulmonary venous/pulmonary arterial hypertension, and low cardiac output.  ED Course:  Initial vitals showed BP 135/93, pulse 93, RR 23, temp 99.9 Fahrenheit, SPO2 98% on room air.  Labs notable for WBC 16.1, hemoglobin 14.8, platelets 2 11,000, sodium 141, potassium 4.8, bicarb 25, BUN 34, creatinine 1.66, EGFR 29.  Portable chest x-ray showed enlarged cardiac silhouette with  aortic atherosclerosis.  No focal consolidation, edema, or effusion.  Hip x-ray showed transcervical left femoral neck fracture, left superior and inferior pubic rami fractures and probable left sacral fracture.  CT head without contrast showed a scalp hematoma without acute intracranial abnormality.  CT pelvis without contrast showed acute left femoral neck fracture with foreshortening and subacute fractures of the left superior and inferior pubic rami as well as the left sacral ala.  EDP consulted orthopedics who recommended transfer to [facility] long likely surgical intervention on 08/08/2019.  Review of Systems: All systems reviewed and are negative except as documented in history of present illness above.   Past Medical History:  Diagnosis Date   Arthritis    CAD    Chronic diastolic CHF (congestive heart failure) (HCC)    CKD (chronic kidney disease), stage IV (HCC)    coronary artery disease    History of kidney stones 01/2017   Hypertension    Hypothyroidism    Mitral regurgitation and mitral stenosis    PAF (paroxysmal atrial fibrillation) ([place])    a. On Eliquis; b. CHADS2VASc => 8 (CHF, HTN, age x 2, stroke x 2, vascular disease, female)   Stroke [facility])    TIA 9/17 & 04/06/2017   Tricuspid regurgitation     Past Surgical History:  Procedure Laterality Date   ABDOMINAL HYSTERECTOMY     CARDIAC CATHETERIZATION     Negative    COLONOSCOPY  2010   PARTIAL HYSTERECTOMY     RIGHT/LEFT HEART CATH AND CORONARY ANGIOGRAPHY N/A 08/02/2019   Procedure: RIGHT/LEFT HEART CATH AND CORONARY ANGIOGRAPHY;  Surgeon: Larey Dresser, MD;  Location: [place] CV LAB;  Service: Cardiovascular;  Laterality: N/A;   TEE WITHOUT CARDIOVERSION N/A 08/02/2019   Procedure: TRANSESOPHAGEAL ECHOCARDIOGRAM (TEE);  Surgeon: Larey Dresser, MD;  Location: [facility] ENDOSCOPY;  Service: Cardiovascular;  Laterality: N/A;   TOTAL KNEE ARTHROPLASTY Left 11/28/2017   Procedure: TOTAL KNEE  ARTHROPLASTY;  Surgeon: Thornton Park, MD;  Location: [facility] ORS;  Service: Orthopedics;  Laterality: Left;    Social History:  reports that she has never smoked. She has never used smokeless tobacco. She reports current alcohol use of about 1.0 standard drinks of alcohol per week. She reports that she does not use drugs.  No Known Allergies  Family History  Problem Relation Age of Onset   Heart attack Father 47   Hypertension Father    Hyperlipidemia Father    Heart disease Brother        valvular disease      Prior to Admission medications   Medication Sig Start Date End Date Taking? Authorizing Provider  acetaminophen (TYLENOL) 500 MG tablet Take 1,000 mg by mouth daily as needed (for pain.).    [provider]  allopurinol (ZYLOPRIM) 100 MG tablet Take 100 mg by mouth daily.  09/08/16   [provider]  cyanocobalamin (,VITAMIN B-12,) 1000 MCG/ML injection Inject 1,000 mcg into the muscle every 30 (thirty) days.    [provider]  ELIQUIS 5 MG TABS tablet Take 1 tablet by mouth twice daily 05/07/19   Minna Merritts, MD  furosemide (LASIX) 40 MG tablet Take 1.5 tablets (60 mg total) by mouth 2 (two) times daily. 08/06/19 08/05/20  Larey Dresser, MD  levothyroxine (SYNTHROID, LEVOTHROID) 112 MCG tablet Take 112 mcg by mouth daily before breakfast. 09/05/17   [provider]  lisinopril (PRINIVIL,ZESTRIL) 40 MG tablet Take 1 tablet (40 mg total) by mouth daily. 01/09/18   Minna Merritts, MD  metoprolol tartrate (LOPRESSOR) 50 MG tablet Take 1 tablet (50 mg total) by mouth 2 (two) times daily. 01/01/19   Dunn, Areta Haber, PA-C  rosuvastatin (CRESTOR) 20 MG tablet Take 1 tablet (20 mg total) by mouth daily. 08/06/19 11/04/19  Larey Dresser, MD  spironolactone (ALDACTONE) 25 MG tablet Take 0.5 tablets (12.5 mg total) by mouth daily. 08/06/19 11/04/19  Larey Dresser, MD    Physical Exam: Vitals:   08/06/19 1548 08/06/19 1826  BP: (!) 142/105  (!) 135/93  Pulse: 70 92  Resp: 18   Temp: 98.3 F (36.8 C)   TempSrc: Oral   SpO2: 99% 96%  Weight: 71.7 kg   Height: '5\' 2"'  (1.575 m)     Constitutional: Obese woman resting supine in bed, NAD, calm, comfortable Eyes: PERRL, lids and conjunctivae normal ENMT: Mucous membranes are moist. Posterior pharynx clear of any exudate or lesions.Normal dentition.  Neck: normal, supple, no masses. Respiratory: clear to auscultation anteriorly, no wheezing, no crackles. Normal respiratory effort. No accessory muscle use.  Cardiovascular: Regular rate and rhythm, systolic murmur present. No pitting extremity edema. 2+ pedal pulses. Abdomen: no tenderness, no masses palpated. No hepatosplenomegaly. Bowel sounds positive.  Musculoskeletal: ROM left hip diminished due to pain/fracture but intact in all extremities.  Left foot is everted. Skin: Left scalp hematoma, multiple bruises on both arms in various stages of healing Neurologic: CN 2-12 grossly intact. Sensation intact, Strength 5/5 in all extremities except LLE due to left hip fracture. Psychiatric: Normal judgment and insight. Alert and oriented x 3. Normal mood.    Labs on Admission: I have personally reviewed following labs and imaging studies  CBC: Recent Labs  Lab 08/02/19 1239 08/02/19 1259 08/02/19 1344 08/06/19 1807  WBC  --   --   --  16.1*  NEUTROABS  --   --   --  12.2*  HGB 15.3* 15.0 14.3   14.3 14.8  HCT 45.0 44.0 42.0   42.0 47.0*  MCV  --   --   --  100.9*  PLT  --   --   --  646   Basic Metabolic Panel: Recent Labs  Lab 08/02/19 1239 08/02/19 1259 08/02/19 1344 08/06/19 1516 08/06/19 1807  NA 143 141 145   145 142 141  K 3.3* 4.8 3.0*   3.1* 3.4* 4.8  CL  --  107  --  105 103  CO2  --   --   --  23 25  GLUCOSE 92 96  --  122* 113*  BUN  --  47*  --  32* 34*  CREATININE  --  1.30*  --  1.62* 1.66*  CALCIUM  --   --   --  9.4 9.2   GFR: Estimated Creatinine Clearance: 25.9 mL/min (A) (by C-G formula  based on SCr of 1.66 mg/dL (H)). Liver Function Tests: No results for input(s): AST, ALT, ALKPHOS, BILITOT, PROT, ALBUMIN in the last 168 hours. No results for input(s): LIPASE, AMYLASE in the last 168 hours. No results for input(s): AMMONIA in the last 168 hours. Coagulation Profile: No results for input(s): INR, PROTIME in the last 168 hours. Cardiac Enzymes: No results for input(s): CKTOTAL, CKMB, CKMBINDEX, TROPONINI in the last 168 hours. BNP (last 3 results) No results for input(s): PROBNP in the last 8760 hours. HbA1C: No results for input(s): HGBA1C in the last 72 hours. CBG: No results for input(s): GLUCAP in the last 168 hours. Lipid Profile: No results for input(s): CHOL, HDL, LDLCALC, TRIG, CHOLHDL, LDLDIRECT in the last 72 hours. Thyroid Function Tests: No results for input(s): TSH, T4TOTAL, FREET4, T3FREE, THYROIDAB in the last 72 hours. Anemia Panel: No results for input(s): VITAMINB12, FOLATE, FERRITIN, TIBC, IRON, RETICCTPCT in the last 72 hours. Urine analysis:    Component Value Date/Time   COLORURINE YELLOW (A) 12/11/2017 1354   APPEARANCEUR CLEAR (A) 12/11/2017 1354   LABSPEC 1.013 12/11/2017 1354   PHURINE 6.0 12/11/2017 1354   GLUCOSEU NEGATIVE 12/11/2017 1354   HGBUR NEGATIVE 12/11/2017 1354   BILIRUBINUR NEGATIVE 12/11/2017 1354   KETONESUR NEGATIVE 12/11/2017 1354   PROTEINUR 100 (A) 12/11/2017 1354   NITRITE NEGATIVE 12/11/2017 1354   LEUKOCYTESUR NEGATIVE 12/11/2017 1354    Radiological Exams on Admission: Dg Chest 1 View  Result Date: 08/06/2019 CLINICAL DATA:  Fall, left hip pain. History hypertension, CAD, CHF, mitral valve disease EXAM: CHEST  1 VIEW COMPARISON:  Most recent radiograph 12/20/2018, CT chest 12/11/2017 FINDINGS: Cardiomegaly with a calcified tortuous aorta similar to prior studies. No consolidation, features of edema, pneumothorax, or effusion. Pulmonary vascularity is normally distributed. No acute osseous or soft tissue  abnormality. Degenerative changes are present in the imaged spine and shoulders. Vascular calcium at the carotid bifurcation on the left. IMPRESSION: No acute cardiopulmonary abnormality or acute traumatic finding in the chest Electronically Signed   By: Lovena Le M.D.   On: 08/06/2019 16:44   Ct Head Wo Contrast  Result Date: 08/06/2019 CLINICAL DATA:  Recent fall with headaches, initial encounter EXAM: CT HEAD WITHOUT CONTRAST TECHNIQUE: Contiguous axial images were obtained from the base of the skull through the vertex without intravenous contrast. COMPARISON:  06/06/2017 FINDINGS: Brain: Mild chronic white matter ischemic changes noted. No findings to suggest acute hemorrhage, acute infarction or space-occupying mass lesion. Vascular: No hyperdense vessel or unexpected calcification. Skull: Normal. Negative for fracture or focal lesion. Sinuses/Orbits: No acute finding. Other: Mild scalp hematoma in the left frontal region consistent with the recent injury. IMPRESSION: Scalp hematoma without acute intracranial abnormality. Electronically Signed   By: Inez Catalina M.D.   On: 08/06/2019 17:41   Dg Hip Unilat With Pelvis 2-3 Views Left  Result Date: 08/06/2019 CLINICAL DATA:  Fall, unable to move left leg EXAM: DG HIP (WITH OR WITHOUT PELVIS) 2-3V LEFT COMPARISON:  None. FINDINGS: Foreshortened transcervical left femoral neck fracture. Additional fractures of left superior and inferior pubic rami as well as disruption of the left sacral arcuate lines suggesting a left sacral fracture as well with hazy periosteal reaction suggesting possible callus formation. No other fracture or traumatic malalignment is seen. The lower sacrum is poorly visualized due to overlying bowel gas the SI joints and symphysis pubis remain congruent. Degenerative changes are present in both hips and within the lower lumbar spine. Vascular calcium is seen in the pelvis. IMPRESSION: 1. Foreshortened transcervical left femoral neck  fracture. 2. Left superior and inferior pubic rami fractures and probable left sacral fracture. More age indeterminate given suggestion of callus formation. 3. Diffuse bone demineralization. Electronically Signed   By: Lovena Le M.D.   On: 08/06/2019 16:42    EKG: Independently reviewed.  Sinus rhythm with minimal ST depression in lateral leads.  Frequent PACs no longer present when compared to prior.  Assessment/Plan Principal Problem:   Fracture of femoral neck, left, closed (HCC) Active Problems:   HTN (hypertension)   Paroxysmal atrial fibrillation (HCC)   Hypothyroidism, unspecified   Chronic diastolic CHF (congestive heart failure) (HCC)   Pulmonary hypertension (HCC)   Mitral regurgitation and mitral stenosis   CKD (chronic kidney disease) stage 3, GFR 30-59 ml/min (HCC)   History of CVA (cerebrovascular accident)  HAN LYSNE is a 78 y.o. female with medical history significant for severe three-vessel CAD, paroxysmal atrial fibrillation on Eliquis, chronic diastolic CHF, pulmonary hypertension, mitral regurgitation and mitral stenosis, history of CVA, hypertension, hyperlipidemia, hypothyroidism, and CKD stage III who is admitted with a left femoral neck fracture after a fall.   Acute left femoral neck fracture, left rami fractures, left sacral ala fracture: Occurring after a mechanical fall.  Orthopedics consulted by EDP and recommend transfer to [facility] long for potential intervention on 08/08/2019.  Patient has significant cardiac comorbidities including severe three-vessel CAD, severe mitral regurgitation and stenosis, diastolic CHF, and pulmonary hypertension.  Discussed with heart failure team Dr. Marigene Ehlers who recommended keeping patient at [facility] for close cardiology involvement for optimization prior to surgery.  Discussed with orthopedics, Dr. Lyla Glassing, who is aware. -Transfer back to [facility], cardiac telemetry floor -Appreciate orthopedics and heart  failure team assistance -Hold Eliquis -Continue pain control with hold parameters  Paroxysmal atrial fibrillation: In sinus rhythm with rate controlled on admission. -Holding home Eliquis -Continue Lopressor 50 mg p.o. twice daily  Severe 3V CAD: Severe 3V CAD, severe mitral regurgitation and stenosis, pulmonary hypertension seen on right/left heart cath on 08/02/2019.  She is being evaluated for CABG and mitral valve replacement by cardiology cardiothoracic surgery.  Will need close cardiology involvement while in hospital. -Continue Lopressor 50 mg p.o. twice daily -Continue rosuvastatin 20 mg daily -Not on aspirin as she has been on Eliquis  Chronic diastolic  CHF: She is volume up on admission.  Has been on Lasix 40 mg oral twice daily with plan to increase to 60 mg twice daily. -IV Lasix 40 mg twice daily -Supplement potassium -Monitor strict I/O's, daily weights  Acute on chronic CKD stage III: Renal function slightly worsened from recent baseline. -Continue IV Lasix as above and monitor renal function closely -Holding lisinopril and spironolactone for now  Hypertension: Continue Lopressor 50 mg twice daily and IV Lasix as above.  Will hold home lisinopril and spironolactone with AKI.  Hyperlipidemia: Continue rosuvastatin 20 mg daily.  Hypothyroidism: Continue Synthroid 112 mcg daily.  Check TSH.  History of CVA: Continue rosuvastatin.  Not on aspirin as she has been on Eliquis.  DVT prophylaxis: SCDs Code Status: Full code, confirmed with patient Family Communication: Discussed with son at bedside Disposition Plan: Pending cardiac optimization and surgical intervention Consults called: Orthopedics Admission status: Inpatient for left hip fracture management.   Zada Finders MD [facility]  If 7PM-7AM, please contact night-coverage [email or web address]  08/06/2019, 7:06 PM

## 2019-08-06 NOTE — ED Notes (Signed)
Patient in CT at this time.

## 2019-08-06 NOTE — Telephone Encounter (Signed)
-----   Message from Rexene Alberts, MD sent at 08/06/2019  6:02 AM EDT ----- I forgot to mention to this patient yesterday that she needs to get in to see her dentist ASAP for preop exam and cleaning - she hasn't seen her dentist in 3 years - please explain to her that this is important for all heart valve patients and offer a referral to Dr Lawana Chambers if her dentist cannot get her in promptly

## 2019-08-07 ENCOUNTER — Inpatient Hospital Stay (HOSPITAL_COMMUNITY): Payer: Medicare Other

## 2019-08-07 DIAGNOSIS — I480 Paroxysmal atrial fibrillation: Secondary | ICD-10-CM

## 2019-08-07 DIAGNOSIS — N189 Chronic kidney disease, unspecified: Secondary | ICD-10-CM

## 2019-08-07 DIAGNOSIS — I5033 Acute on chronic diastolic (congestive) heart failure: Secondary | ICD-10-CM

## 2019-08-07 DIAGNOSIS — S72002D Fracture of unspecified part of neck of left femur, subsequent encounter for closed fracture with routine healing: Secondary | ICD-10-CM

## 2019-08-07 LAB — SURGICAL PCR SCREEN
MRSA, PCR: NEGATIVE
Staphylococcus aureus: NEGATIVE

## 2019-08-07 LAB — CBC
HCT: 45 % (ref 36.0–46.0)
Hemoglobin: 14.7 g/dL (ref 12.0–15.0)
MCH: 32.2 pg (ref 26.0–34.0)
MCHC: 32.7 g/dL (ref 30.0–36.0)
MCV: 98.7 fL (ref 80.0–100.0)
Platelets: 194 10*3/uL (ref 150–400)
RBC: 4.56 MIL/uL (ref 3.87–5.11)
RDW: 16.8 % — ABNORMAL HIGH (ref 11.5–15.5)
WBC: 11.4 10*3/uL — ABNORMAL HIGH (ref 4.0–10.5)
nRBC: 0 % (ref 0.0–0.2)

## 2019-08-07 LAB — BASIC METABOLIC PANEL
Anion gap: 12 (ref 5–15)
BUN: 29 mg/dL — ABNORMAL HIGH (ref 8–23)
CO2: 24 mmol/L (ref 22–32)
Calcium: 9.3 mg/dL (ref 8.9–10.3)
Chloride: 105 mmol/L (ref 98–111)
Creatinine, Ser: 1.59 mg/dL — ABNORMAL HIGH (ref 0.44–1.00)
GFR calc Af Amer: 36 mL/min — ABNORMAL LOW (ref >60)
GFR calc non Af Amer: 31 mL/min — ABNORMAL LOW (ref >60)
Glucose, Bld: 113 mg/dL — ABNORMAL HIGH (ref 70–99)
Potassium: 3.7 mmol/L (ref 3.5–5.1)
Sodium: 141 mmol/L (ref 135–145)

## 2019-08-07 LAB — SARS CORONAVIRUS 2 BY RT PCR (HOSPITAL ORDER, PERFORMED IN ~~LOC~~ HOSPITAL LAB): SARS Coronavirus 2: NEGATIVE

## 2019-08-07 MED ORDER — POVIDONE-IODINE 10 % EX SWAB: 2.0000 "application " | Freq: Once | CUTANEOUS | Status: DC

## 2019-08-07 MED ORDER — POTASSIUM CHLORIDE CRYS ER 20 MEQ PO TBCR
20.0000 meq | EXTENDED_RELEASE_TABLET | Freq: Once | ORAL | Status: AC
  Administered 2019-08-07: 13:00:00 20 meq via ORAL
  Filled 2019-08-07: qty 1

## 2019-08-07 MED ORDER — POTASSIUM CHLORIDE 20 MEQ/15ML (10%) PO SOLN
20.0000 meq | Freq: Every day | ORAL | Status: DC
  Administered 2019-08-07 – 2019-08-09 (×3): 20 meq via ORAL
  Filled 2019-08-07 (×3): qty 15

## 2019-08-07 MED ORDER — TRANEXAMIC ACID-NACL 1000-0.7 MG/100ML-% IV SOLN: 1000.0000 mg | INTRAVENOUS | Status: DC

## 2019-08-07 MED ORDER — ENSURE PRE-SURGERY PO LIQD
296.0000 mL | Freq: Once | ORAL | Status: DC
  Filled 2019-08-07: qty 296

## 2019-08-07 MED ORDER — ONDANSETRON HCL 4 MG/2ML IJ SOLN
4.0000 mg | Freq: Three times a day (TID) | INTRAMUSCULAR | Status: DC | PRN
  Administered 2019-08-07: 08:00:00 4 mg via INTRAVENOUS
  Filled 2019-08-07: qty 2

## 2019-08-07 MED ORDER — PANTOPRAZOLE SODIUM 40 MG PO TBEC
40.0000 mg | DELAYED_RELEASE_TABLET | Freq: Every day | ORAL | Status: DC
  Administered 2019-08-07 – 2019-08-12 (×6): 40 mg via ORAL
  Filled 2019-08-07 (×6): qty 1

## 2019-08-07 MED ORDER — CHLORHEXIDINE GLUCONATE 4 % EX LIQD
60.0000 mL | Freq: Once | CUTANEOUS | Status: AC
  Administered 2019-08-08: 4 via TOPICAL
  Filled 2019-08-07: qty 15

## 2019-08-07 MED ORDER — CEFAZOLIN SODIUM-DEXTROSE 2-4 GM/100ML-% IV SOLN: 2.0000 g | INTRAVENOUS | Status: DC

## 2019-08-07 MED ORDER — FUROSEMIDE 10 MG/ML IJ SOLN
80.0000 mg | Freq: Two times a day (BID) | INTRAMUSCULAR | Status: DC
  Administered 2019-08-07 – 2019-08-08 (×3): 80 mg via INTRAVENOUS
  Filled 2019-08-07 (×3): qty 8

## 2019-08-07 MED ORDER — SPIRONOLACTONE 12.5 MG HALF TABLET
12.5000 mg | ORAL_TABLET | Freq: Every day | ORAL | Status: DC
  Administered 2019-08-07 – 2019-08-09 (×3): 12.5 mg via ORAL
  Filled 2019-08-07 (×4): qty 1

## 2019-08-07 NOTE — Consult Note (Signed)
ORTHOPAEDIC CONSULTATION  REQUESTING PHYSICIAN: Modena Jansky, MD  PCP:  Remi Haggard, FNP  Chief Complaint: left femoral neck fracture  HPI: Caroline [name] is a 78 y.o. female with a history of atrial fibrillation on Eliquis, coronary artery disease, chronic diastolic CHF, pulmonary hypertension, mitral valve disease, stage III chronic kidney disease, and history of CVA who complains of left hip pain and inability to weight-bear after she sustained a ground-level fall at her cardiology appointment yesterday.  Her last dose of Eliquis was yesterday morning.  She is currently being seen by cardiology for medical optimization prior to undergoing CABG and mitral valve replacement.  Patient lived completely independently up until about 4 to 6 weeks ago, when she fell in her garage.  She had left sided pelvic and groin pain with difficulty weightbearing.  Prior to this injury, she ambulated without any assist device.  She did all of her grocery shopping.  She has been on a walker since her fall in the garage.  She was admitted to [facility] yesterday after falling again.  X-rays of the left hip in the emergency department showed a displaced left femoral neck fracture as well as subacute left-sided pelvic fracture.  CT scan of the pelvis was then obtained, confirming left LC 1 pelvis fracture with bony callus formation.  She has been seen by cardiology.  The plan is for IV diuresis to medically optimize her for surgery.  Orthopedic consultation was placed for management of her left femoral neck fracture.  Of note, she has had a previous left total knee replacement by Dr. Mack Guise.  Past Medical History:  Diagnosis Date  . Arthritis   . CAD   . Chronic diastolic CHF (congestive heart failure) ([place])   . CKD (chronic kidney disease), stage IV ([place])   . coronary artery disease   . History of kidney stones 01/2017  . Hypertension   . Hypothyroidism   . Mitral regurgitation and  mitral stenosis   . PAF (paroxysmal atrial fibrillation) ([place])    a. On Eliquis; b. CHADS2VASc => 8 (CHF, HTN, age x 2, stroke x 2, vascular disease, female)  . Stroke [facility])    TIA 9/17 & 04/06/2017  . Tricuspid regurgitation    Past Surgical History:  Procedure Laterality Date  . ABDOMINAL HYSTERECTOMY    . CARDIAC CATHETERIZATION     Negative   . COLONOSCOPY  2010  . PARTIAL HYSTERECTOMY    . RIGHT/LEFT HEART CATH AND CORONARY ANGIOGRAPHY N/A 08/02/2019   Procedure: RIGHT/LEFT HEART CATH AND CORONARY ANGIOGRAPHY;  Surgeon: Larey Dresser, MD;  Location: [facility];  Service: Cardiovascular;  Laterality: N/A;  . TEE WITHOUT CARDIOVERSION N/A 08/02/2019   Procedure: TRANSESOPHAGEAL ECHOCARDIOGRAM (TEE);  Surgeon: Larey Dresser, MD;  Location: [facility] ENDOSCOPY;  Service: Cardiovascular;  Laterality: N/A;  . TOTAL KNEE ARTHROPLASTY Left 11/28/2017   Procedure: TOTAL KNEE ARTHROPLASTY;  Surgeon: Thornton Park, MD;  Location: [facility] ORS;  Service: Orthopedics;  Laterality: Left;   Social History   Socioeconomic History  . Marital status: Married    Spouse name: Not on file  . Number of children: Not on file  . Years of education: Not on file  . Highest education level: Not on file  Occupational History  . Occupation: retired  Scientific laboratory technician  . Financial resource strain: Not on file  . Food insecurity    Worry: Not on file    Inability: Not on file  .  Transportation needs    Medical: Not on file    Non-medical: Not on file  Tobacco Use  . Smoking status: Never Smoker  . Smokeless tobacco: Never Used  Substance and Sexual Activity  . Alcohol use: Yes    Alcohol/week: 1.0 standard drinks    Types: 1 Glasses of wine per week    Comment: social   . Drug use: No  . Sexual activity: Not on file  Lifestyle  . Physical activity    Days per week: Not on file    Minutes per session: Not on file  . Stress: Not on file  Relationships  . Social Herbalist on phone:  Not on file    Gets together: Not on file    Attends religious service: Not on file    Active member of club or organization: Not on file    Attends meetings of clubs or organizations: Not on file    Relationship status: Not on file  Other Topics Concern  . Not on file  Social History Narrative  . Not on file   Family History  Problem Relation Age of Onset  . Heart attack Father 24  . Hypertension Father   . Hyperlipidemia Father   . Heart disease Brother        valvular disease    No Known Allergies Prior to Admission medications   Medication Sig Start Date End Date Taking? Authorizing Provider  acetaminophen (TYLENOL) 500 MG tablet Take 1,000 mg by mouth daily as needed (for pain.).   Yes [provider]  allopurinol (ZYLOPRIM) 100 MG tablet Take 100 mg by mouth daily.  09/08/16  Yes [provider]  ELIQUIS 5 MG TABS tablet Take 1 tablet by mouth twice daily 05/07/19  Yes Gollan, Kathlene November, MD  furosemide (LASIX) 40 MG tablet Take 1.5 tablets (60 mg total) by mouth 2 (two) times daily. 08/06/19 08/05/20 Yes Larey Dresser, MD  levothyroxine (SYNTHROID, LEVOTHROID) 112 MCG tablet Take 112 mcg by mouth daily before breakfast. 09/05/17  Yes [provider]  lisinopril (PRINIVIL,ZESTRIL) 40 MG tablet Take 1 tablet (40 mg total) by mouth daily. 01/09/18  Yes Minna Merritts, MD  metoprolol tartrate (LOPRESSOR) 50 MG tablet Take 1 tablet (50 mg total) by mouth 2 (two) times daily. 01/01/19  Yes Dunn, Areta Haber, PA-C  rosuvastatin (CRESTOR) 20 MG tablet Take 1 tablet (20 mg total) by mouth daily. 08/06/19 11/04/19 Yes Larey Dresser, MD  spironolactone (ALDACTONE) 25 MG tablet Take 0.5 tablets (12.5 mg total) by mouth daily. 08/06/19 11/04/19 Yes Larey Dresser, MD   Dg Chest 1 View  Result Date: 08/06/2019 CLINICAL DATA:  Fall, left hip pain. History hypertension, CAD, CHF, mitral valve disease EXAM: CHEST  1 VIEW COMPARISON:  Most recent radiograph 12/20/2018, CT  chest 12/11/2017 FINDINGS: Cardiomegaly with a calcified tortuous aorta similar to prior studies. No consolidation, features of edema, pneumothorax, or effusion. Pulmonary vascularity is normally distributed. No acute osseous or soft tissue abnormality. Degenerative changes are present in the imaged spine and shoulders. Vascular calcium at the carotid bifurcation on the left. IMPRESSION: No acute cardiopulmonary abnormality or acute traumatic finding in the chest Electronically Signed   By: Lovena Le M.D.   On: 08/06/2019 16:44   Ct Head Wo Contrast  Result Date: 08/06/2019 CLINICAL DATA:  Recent fall with headaches, initial encounter EXAM: CT HEAD WITHOUT CONTRAST TECHNIQUE: Contiguous axial images were obtained from the base of the skull  through the vertex without intravenous contrast. COMPARISON:  06/06/2017 FINDINGS: Brain: Mild chronic white matter ischemic changes noted. No findings to suggest acute hemorrhage, acute infarction or space-occupying mass lesion. Vascular: No hyperdense vessel or unexpected calcification. Skull: Normal. Negative for fracture or focal lesion. Sinuses/Orbits: No acute finding. Other: Mild scalp hematoma in the left frontal region consistent with the recent injury. IMPRESSION: Scalp hematoma without acute intracranial abnormality. Electronically Signed   By: Inez Catalina M.D.   On: 08/06/2019 17:41   Ct Pelvis Wo Contrast  Result Date: 08/06/2019 CLINICAL DATA:  Fall.  Evaluate for fracture. EXAM: CT PELVIS WITHOUT CONTRAST TECHNIQUE: Multidetector CT imaging of the pelvis was performed following the standard protocol without intravenous contrast. COMPARISON:  Left hip x-rays from same day. FINDINGS: Urinary Tract:  No abnormality visualized. Bowel:  Sigmoid diverticulosis. Vascular/Lymphatic: Aortoiliac atherosclerosis. No pathologically enlarged lymph nodes. Reproductive:  Prior hysterectomy.  No adnexal mass. Other:  None. Musculoskeletal: Acute fracture of the left  femoral neck with foreshortening and apex anterior angulation. Subacute, healing fractures of the left superior and inferior pubic rami with surrounding callus formation. Linear sclerosis in the left sacral ala. No dislocation. IMPRESSION: 1. Acute left femoral neck fracture with foreshortening. 2. Subacute fractures of the left superior and inferior pubic rami as well as the left sacral ala. Electronically Signed   By: Titus Dubin M.D.   On: 08/06/2019 20:34   Dg Hip Unilat With Pelvis 2-3 Views Left  Result Date: 08/06/2019 CLINICAL DATA:  Fall, unable to move left leg EXAM: DG HIP (WITH OR WITHOUT PELVIS) 2-3V LEFT COMPARISON:  None. FINDINGS: Foreshortened transcervical left femoral neck fracture. Additional fractures of left superior and inferior pubic rami as well as disruption of the left sacral arcuate lines suggesting a left sacral fracture as well with hazy periosteal reaction suggesting possible callus formation. No other fracture or traumatic malalignment is seen. The lower sacrum is poorly visualized due to overlying bowel gas the SI joints and symphysis pubis remain congruent. Degenerative changes are present in both hips and within the lower lumbar spine. Vascular calcium is seen in the pelvis. IMPRESSION: 1. Foreshortened transcervical left femoral neck fracture. 2. Left superior and inferior pubic rami fractures and probable left sacral fracture. More age indeterminate given suggestion of callus formation. 3. Diffuse bone demineralization. Electronically Signed   By: Lovena Le M.D.   On: 08/06/2019 16:42    Positive ROS: All other systems have been reviewed and were otherwise negative with the exception of those mentioned in the HPI and as above.  Physical Exam: General: Alert, no acute distress Cardiovascular: No pedal edema Respiratory: No cyanosis, no use of accessory musculature GI: No organomegaly, abdomen is soft and non-tender Skin: No lesions in the area of chief  complaint Neurologic: Sensation intact distally Psychiatric: Patient is competent for consent with normal mood and affect Lymphatic: No axillary or cervical lymphadenopathy  MUSCULOSKELETAL: Examination of the left lower extremity reveals no skin wounds or lesions.  She has bruising about the thigh and lower leg.  She is shortened and externally rotated.  She has pain with logrolling of the hip.  She has a healed anterior scar over the knee.  She has 1+ pedal pulses.  Positive motor function dorsiflexion, plantarflexion, and great toe extension.  She reports intact sensation.  Assessment: Displaced left femoral neck fracture. Subacute left LC 1 pelvis fracture. Chronic Eliquis usage. Significant heart history.  Plan: I discussed the findings with the patient and her son.  We discussed left total hip arthroplasty in order to provide pain control and allow immediate mobilization.  We discussed the risk, benefits, and alternatives.  They understand that there is increased medical risk due to her medical problems.  Nonsurgical treatment is not an option, and would surely lead to her demise.  Cardiology wants her to do physical therapy in order to be ready for heart surgery.  We will plan for surgery tomorrow.  Continue holding Eliquis.  All questions were solicited and answered.  The risks, benefits, and alternatives were discussed with the patient. There are risks associated with the surgery including, but not limited to, problems with anesthesia (death), infection, instability (giving out of the joint), dislocation, differences in leg length/angulation/rotation, fracture of bones, loosening or failure of implants, hematoma (blood accumulation) which may require surgical drainage, blood clots, pulmonary embolism, nerve injury (foot drop and lateral thigh numbness), and blood vessel injury. The patient understands these risks and elects to proceed.   Bertram Savin, MD Cell [phone]     08/07/2019 3:47 PM

## 2019-08-07 NOTE — Progress Notes (Signed)
[facility] here for transport.  

## 2019-08-07 NOTE — Plan of Care (Signed)
  Problem: Clinical Measurements: Goal: Respiratory complications will improve Outcome: Progressing Note: No s/s of respiratory complications noted. Goal: Cardiovascular complication will be avoided Outcome: Progressing Note: No s/s of cardiovascular complications noted.   

## 2019-08-07 NOTE — H&P (View-Only) (Signed)
ORTHOPAEDIC CONSULTATION  REQUESTING PHYSICIAN: Modena Jansky, MD  PCP:  Remi Haggard, FNP  Chief Complaint: left femoral neck fracture  HPI: Caroline [name] is a 78 y.o. female with a history of atrial fibrillation on Eliquis, coronary artery disease, chronic diastolic CHF, pulmonary hypertension, mitral valve disease, stage III chronic kidney disease, and history of CVA who complains of left hip pain and inability to weight-bear after she sustained a ground-level fall at her cardiology appointment yesterday.  Her last dose of Eliquis was yesterday morning.  She is currently being seen by cardiology for medical optimization prior to undergoing CABG and mitral valve replacement.  Patient lived completely independently up until about 4 to 6 weeks ago, when she fell in her garage.  She had left sided pelvic and groin pain with difficulty weightbearing.  Prior to this injury, she ambulated without any assist device.  She did all of her grocery shopping.  She has been on a walker since her fall in the garage.  She was admitted to [facility] yesterday after falling again.  X-rays of the left hip in the emergency department showed a displaced left femoral neck fracture as well as subacute left-sided pelvic fracture.  CT scan of the pelvis was then obtained, confirming left LC 1 pelvis fracture with bony callus formation.  She has been seen by cardiology.  The plan is for IV diuresis to medically optimize her for surgery.  Orthopedic consultation was placed for management of her left femoral neck fracture.  Of note, she has had a previous left total knee replacement by Dr. Mack Guise.  Past Medical History:  Diagnosis Date  . Arthritis   . CAD   . Chronic diastolic CHF (congestive heart failure) ([place])   . CKD (chronic kidney disease), stage IV ([place])   . coronary artery disease   . History of kidney stones 01/2017  . Hypertension   . Hypothyroidism   . Mitral regurgitation and  mitral stenosis   . PAF (paroxysmal atrial fibrillation) ([place])    a. On Eliquis; b. CHADS2VASc => 8 (CHF, HTN, age x 2, stroke x 2, vascular disease, female)  . Stroke [facility])    TIA 9/17 & 04/06/2017  . Tricuspid regurgitation    Past Surgical History:  Procedure Laterality Date  . ABDOMINAL HYSTERECTOMY    . CARDIAC CATHETERIZATION     Negative   . COLONOSCOPY  2010  . PARTIAL HYSTERECTOMY    . RIGHT/LEFT HEART CATH AND CORONARY ANGIOGRAPHY N/A 08/02/2019   Procedure: RIGHT/LEFT HEART CATH AND CORONARY ANGIOGRAPHY;  Surgeon: Larey Dresser, MD;  Location: [place] [facility];  Service: Cardiovascular;  Laterality: N/A;  . TEE WITHOUT CARDIOVERSION N/A 08/02/2019   Procedure: TRANSESOPHAGEAL ECHOCARDIOGRAM (TEE);  Surgeon: Larey Dresser, MD;  Location: [facility] ENDOSCOPY;  Service: Cardiovascular;  Laterality: N/A;  . TOTAL KNEE ARTHROPLASTY Left 11/28/2017   Procedure: TOTAL KNEE ARTHROPLASTY;  Surgeon: Thornton Park, MD;  Location: [facility] ORS;  Service: Orthopedics;  Laterality: Left;   Social History   Socioeconomic History  . Marital status: Married    Spouse name: Not on file  . Number of children: Not on file  . Years of education: Not on file  . Highest education level: Not on file  Occupational History  . Occupation: retired  Scientific laboratory technician  . Financial resource strain: Not on file  . Food insecurity    Worry: Not on file    Inability: Not on file  .  Transportation needs    Medical: Not on file    Non-medical: Not on file  Tobacco Use  . Smoking status: Never Smoker  . Smokeless tobacco: Never Used  Substance and Sexual Activity  . Alcohol use: Yes    Alcohol/week: 1.0 standard drinks    Types: 1 Glasses of wine per week    Comment: social   . Drug use: No  . Sexual activity: Not on file  Lifestyle  . Physical activity    Days per week: Not on file    Minutes per session: Not on file  . Stress: Not on file  Relationships  . Social Herbalist on phone:  Not on file    Gets together: Not on file    Attends religious service: Not on file    Active member of club or organization: Not on file    Attends meetings of clubs or organizations: Not on file    Relationship status: Not on file  Other Topics Concern  . Not on file  Social History Narrative  . Not on file   Family History  Problem Relation Age of Onset  . Heart attack Father 42  . Hypertension Father   . Hyperlipidemia Father   . Heart disease Brother        valvular disease    No Known Allergies Prior to Admission medications   Medication Sig Start Date End Date Taking? Authorizing Provider  acetaminophen (TYLENOL) 500 MG tablet Take 1,000 mg by mouth daily as needed (for pain.).   Yes [provider]  allopurinol (ZYLOPRIM) 100 MG tablet Take 100 mg by mouth daily.  09/08/16  Yes [provider]  ELIQUIS 5 MG TABS tablet Take 1 tablet by mouth twice daily 05/07/19  Yes Gollan, Kathlene November, MD  furosemide (LASIX) 40 MG tablet Take 1.5 tablets (60 mg total) by mouth 2 (two) times daily. 08/06/19 08/05/20 Yes Larey Dresser, MD  levothyroxine (SYNTHROID, LEVOTHROID) 112 MCG tablet Take 112 mcg by mouth daily before breakfast. 09/05/17  Yes [provider]  lisinopril (PRINIVIL,ZESTRIL) 40 MG tablet Take 1 tablet (40 mg total) by mouth daily. 01/09/18  Yes Minna Merritts, MD  metoprolol tartrate (LOPRESSOR) 50 MG tablet Take 1 tablet (50 mg total) by mouth 2 (two) times daily. 01/01/19  Yes Dunn, Areta Haber, PA-C  rosuvastatin (CRESTOR) 20 MG tablet Take 1 tablet (20 mg total) by mouth daily. 08/06/19 11/04/19 Yes Larey Dresser, MD  spironolactone (ALDACTONE) 25 MG tablet Take 0.5 tablets (12.5 mg total) by mouth daily. 08/06/19 11/04/19 Yes Larey Dresser, MD   Dg Chest 1 View  Result Date: 08/06/2019 CLINICAL DATA:  Fall, left hip pain. History hypertension, CAD, CHF, mitral valve disease EXAM: CHEST  1 VIEW COMPARISON:  Most recent radiograph 12/20/2018, CT  chest 12/11/2017 FINDINGS: Cardiomegaly with a calcified tortuous aorta similar to prior studies. No consolidation, features of edema, pneumothorax, or effusion. Pulmonary vascularity is normally distributed. No acute osseous or soft tissue abnormality. Degenerative changes are present in the imaged spine and shoulders. Vascular calcium at the carotid bifurcation on the left. IMPRESSION: No acute cardiopulmonary abnormality or acute traumatic finding in the chest Electronically Signed   By: Lovena Le M.D.   On: 08/06/2019 16:44   Ct Head Wo Contrast  Result Date: 08/06/2019 CLINICAL DATA:  Recent fall with headaches, initial encounter EXAM: CT HEAD WITHOUT CONTRAST TECHNIQUE: Contiguous axial images were obtained from the base of the skull  through the vertex without intravenous contrast. COMPARISON:  06/06/2017 FINDINGS: Brain: Mild chronic white matter ischemic changes noted. No findings to suggest acute hemorrhage, acute infarction or space-occupying mass lesion. Vascular: No hyperdense vessel or unexpected calcification. Skull: Normal. Negative for fracture or focal lesion. Sinuses/Orbits: No acute finding. Other: Mild scalp hematoma in the left frontal region consistent with the recent injury. IMPRESSION: Scalp hematoma without acute intracranial abnormality. Electronically Signed   By: Inez Catalina M.D.   On: 08/06/2019 17:41   Ct Pelvis Wo Contrast  Result Date: 08/06/2019 CLINICAL DATA:  Fall.  Evaluate for fracture. EXAM: CT PELVIS WITHOUT CONTRAST TECHNIQUE: Multidetector CT imaging of the pelvis was performed following the standard protocol without intravenous contrast. COMPARISON:  Left hip x-rays from same day. FINDINGS: Urinary Tract:  No abnormality visualized. Bowel:  Sigmoid diverticulosis. Vascular/Lymphatic: Aortoiliac atherosclerosis. No pathologically enlarged lymph nodes. Reproductive:  Prior hysterectomy.  No adnexal mass. Other:  None. Musculoskeletal: Acute fracture of the left  femoral neck with foreshortening and apex anterior angulation. Subacute, healing fractures of the left superior and inferior pubic rami with surrounding callus formation. Linear sclerosis in the left sacral ala. No dislocation. IMPRESSION: 1. Acute left femoral neck fracture with foreshortening. 2. Subacute fractures of the left superior and inferior pubic rami as well as the left sacral ala. Electronically Signed   By: Titus Dubin M.D.   On: 08/06/2019 20:34   Dg Hip Unilat With Pelvis 2-3 Views Left  Result Date: 08/06/2019 CLINICAL DATA:  Fall, unable to move left leg EXAM: DG HIP (WITH OR WITHOUT PELVIS) 2-3V LEFT COMPARISON:  None. FINDINGS: Foreshortened transcervical left femoral neck fracture. Additional fractures of left superior and inferior pubic rami as well as disruption of the left sacral arcuate lines suggesting a left sacral fracture as well with hazy periosteal reaction suggesting possible callus formation. No other fracture or traumatic malalignment is seen. The lower sacrum is poorly visualized due to overlying bowel gas the SI joints and symphysis pubis remain congruent. Degenerative changes are present in both hips and within the lower lumbar spine. Vascular calcium is seen in the pelvis. IMPRESSION: 1. Foreshortened transcervical left femoral neck fracture. 2. Left superior and inferior pubic rami fractures and probable left sacral fracture. More age indeterminate given suggestion of callus formation. 3. Diffuse bone demineralization. Electronically Signed   By: Lovena Le M.D.   On: 08/06/2019 16:42    Positive ROS: All other systems have been reviewed and were otherwise negative with the exception of those mentioned in the HPI and as above.  Physical Exam: General: Alert, no acute distress Cardiovascular: No pedal edema Respiratory: No cyanosis, no use of accessory musculature GI: No organomegaly, abdomen is soft and non-tender Skin: No lesions in the area of chief  complaint Neurologic: Sensation intact distally Psychiatric: Patient is competent for consent with normal mood and affect Lymphatic: No axillary or cervical lymphadenopathy  MUSCULOSKELETAL: Examination of the left lower extremity reveals no skin wounds or lesions.  She has bruising about the thigh and lower leg.  She is shortened and externally rotated.  She has pain with logrolling of the hip.  She has a healed anterior scar over the knee.  She has 1+ pedal pulses.  Positive motor function dorsiflexion, plantarflexion, and great toe extension.  She reports intact sensation.  Assessment: Displaced left femoral neck fracture. Subacute left LC 1 pelvis fracture. Chronic Eliquis usage. Significant heart history.  Plan: I discussed the findings with the patient and her son.  We discussed left total hip arthroplasty in order to provide pain control and allow immediate mobilization.  We discussed the risk, benefits, and alternatives.  They understand that there is increased medical risk due to her medical problems.  Nonsurgical treatment is not an option, and would surely lead to her demise.  Cardiology wants her to do physical therapy in order to be ready for heart surgery.  We will plan for surgery tomorrow.  Continue holding Eliquis.  All questions were solicited and answered.  The risks, benefits, and alternatives were discussed with the patient. There are risks associated with the surgery including, but not limited to, problems with anesthesia (death), infection, instability (giving out of the joint), dislocation, differences in leg length/angulation/rotation, fracture of bones, loosening or failure of implants, hematoma (blood accumulation) which may require surgical drainage, blood clots, pulmonary embolism, nerve injury (foot drop and lateral thigh numbness), and blood vessel injury. The patient understands these risks and elects to proceed.   Bertram Savin, MD Cell [phone]     08/07/2019 3:47 PM

## 2019-08-07 NOTE — Progress Notes (Signed)
Report called to Hilda Blades, RN on 6E at [facility].

## 2019-08-07 NOTE — Consult Note (Addendum)
Advanced Heart Failure Team Consult Note   Primary Physician: Remi Haggard, FNP PCP-Cardiologist:  Ida Rogue, MD  Reason for Consultation: PAH/CAD/Mitral Valve Disease.    HPI:    Caroline [name] is seen today for evaluation of heart failure  at the request of Dr Algis Liming.   78 y.o. with history of chronic diastolic CHF, mitral valve disease, paroxysmal atrial fibrillation, CKD stage 3, and PAD.  Patient reports exertional dyspnea since 2018 (after left TKR complicated by respiratory failure).  Dyspnea has been gradually worsening over time, when I initially saw her, she was short of breath walking 100-150 feet and with most moderate exertion.  No orthopnea/PND.  No chest pain. Echo in 1/20 showed normal LV EF with mild LVH, normal RV with moderate pulmonary hypertension (PASP 60 mmHg), and mild to moderate mitral stenosis.  No known history of rheumatic fever. She has paroxysmal atrial fibrillation but is generally in NSR. She has home oxygen which she only uses prn.  She never smoked. Noted to have moderate PAD by ABIs.    She had a TEE in 8/20, showing EF 55-60% with moderate TR and rheumatic-appearing mitral valve with severe mitral regurgitation and mild mitral stenosis.  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  She has seen Dr. Roxy Manns for evaluation for MVR/CABG.   Yesterday she presented for  followup of CHF, CAD, and mitral valve disease.  While getting out of her wheelchair in the office today, she tripped and fell on her left hip.  She has significant left-sided hip pain now. She was sent to [facility] ED and had left femoral neck fracture. CT of head showed scalp hematoma. Ortho consulted with plan for surgery on 8/13. Pertinent admission labs included: K 4.8, Creatinine 1.6, WBC 16.   Complaining of left hip pain.    TEE  (8/20): EF 55-60% with moderate TR and rheumatic-appearing mitral valve with severe mitral regurgitation and mild mitral stenosis.  Review of Systems: [y] = yes, [ ]  = no   . General: Weight gain [ ] ; Weight loss [ ] ; Anorexia [ ] ; Fatigue [Y ]; Fever [ ] ; Chills [ ] ; Weakness [ ]   . Cardiac: Chest pain/pressure [ ] ; Resting SOB [ ] ; Exertional SOB [Y ]; Orthopnea [ ] ; Pedal Edema [Y ]; Palpitations [ ] ; Syncope [ ] ; Presyncope [ ] ; Paroxysmal nocturnal dyspnea[ ]   . Pulmonary: Cough [ ] ; Wheezing[ ] ; Hemoptysis[ ] ; Sputum [ ] ; Snoring [ ]   . GI: Vomiting[ ] ; Dysphagia[ ] ; Melena[ ] ; Hematochezia [ ] ; Heartburn[ ] ; Abdominal pain [ ] ; Constipation [ ] ; Diarrhea [ ] ; BRBPR [ ]   . GU: Hematuria[ ] ; Dysuria [ ] ; Nocturia[ ]   . Vascular: Pain in legs with walking [ ] ; Pain in feet with lying flat [ ] ; Non-healing sores [ ] ; Stroke [ ] ; TIA [ ] ; Slurred speech [ ] ;  . Neuro: Headaches[ ] ; Vertigo[ ] ; Seizures[ ] ; Paresthesias[ ] ;Blurred vision [ ] ; Diplopia [ ] ; Vision changes [ ]   . Ortho/Skin: Arthritis [ ] ; Joint pain [ Y]; Muscle pain [ ] ; Joint swelling [ ] ; Back Pain [Y ]; Rash [ ]   . Psych: Depression[ ] ; Anxiety[ ]   . Heme: Bleeding problems [ ] ; Clotting disorders [ ] ; Anemia [ ]   . Endocrine: Diabetes [ ] ; Thyroid dysfunction[ ]   Home Medications Prior to Admission medications  Medication Sig Start Date End Date Taking? Authorizing Provider  acetaminophen (TYLENOL) 500 MG tablet Take 1,000 mg by mouth daily as needed (for pain.).   Yes [provider]  allopurinol (ZYLOPRIM) 100 MG tablet Take 100 mg by mouth daily.  09/08/16  Yes [provider]  ELIQUIS 5 MG TABS tablet Take 1 tablet by mouth twice daily 05/07/19  Yes Gollan, Kathlene November, MD  furosemide (LASIX) 40 MG tablet Take 1.5 tablets (60 mg total) by mouth 2 (two) times daily. 08/06/19 08/05/20 Yes Larey Dresser, MD  levothyroxine (SYNTHROID, LEVOTHROID) 112 MCG tablet Take 112 mcg by mouth daily before breakfast.  09/05/17  Yes [provider]  lisinopril (PRINIVIL,ZESTRIL) 40 MG tablet Take 1 tablet (40 mg total) by mouth daily. 01/09/18  Yes Minna Merritts, MD  metoprolol tartrate (LOPRESSOR) 50 MG tablet Take 1 tablet (50 mg total) by mouth 2 (two) times daily. 01/01/19  Yes Dunn, Areta Haber, PA-C  rosuvastatin (CRESTOR) 20 MG tablet Take 1 tablet (20 mg total) by mouth daily. 08/06/19 11/04/19 Yes Larey Dresser, MD  spironolactone (ALDACTONE) 25 MG tablet Take 0.5 tablets (12.5 mg total) by mouth daily. 08/06/19 11/04/19 Yes Larey Dresser, MD    Past Medical History: Past Medical History:  Diagnosis Date  . Arthritis   . CAD   . Chronic diastolic CHF (congestive heart failure) ([place])   . CKD (chronic kidney disease), stage IV ([place])   . coronary artery disease   . History of kidney stones 01/2017  . Hypertension   . Hypothyroidism   . Mitral regurgitation and mitral stenosis   . PAF (paroxysmal atrial fibrillation) ([address])    a. On Eliquis; b. CHADS2VASc => 8 (CHF, HTN, age x 2, stroke x 2, vascular disease, female)  . Stroke [facility])    TIA 9/17 & 04/06/2017  . Tricuspid regurgitation     Past Surgical History: Past Surgical History:  Procedure Laterality Date  . ABDOMINAL HYSTERECTOMY    . CARDIAC CATHETERIZATION     Negative   . COLONOSCOPY  2010  . PARTIAL HYSTERECTOMY    . RIGHT/LEFT HEART CATH AND CORONARY ANGIOGRAPHY N/A 08/02/2019   Procedure: RIGHT/LEFT HEART CATH AND CORONARY ANGIOGRAPHY;  Surgeon: Larey Dresser, MD;  Location: [place] CV LAB;  Service: Cardiovascular;  Laterality: N/A;  . TEE WITHOUT CARDIOVERSION N/A 08/02/2019   Procedure: TRANSESOPHAGEAL ECHOCARDIOGRAM (TEE);  Surgeon: Larey Dresser, MD;  Location: [facility] ENDOSCOPY;  Service: Cardiovascular;  Laterality: N/A;  . TOTAL KNEE ARTHROPLASTY Left 11/28/2017   Procedure: TOTAL KNEE ARTHROPLASTY;  Surgeon: Thornton Park, MD;  Location: [facility] ORS;  Service: Orthopedics;  Laterality: Left;    Family History:  Family History  Problem Relation Age of Onset  . Heart attack Father 86  . Hypertension Father   . Hyperlipidemia Father   . Heart disease Brother        valvular disease     Social History: Social History   Socioeconomic History  . Marital status: Married    Spouse name: Not on file  . Number of children: Not on file  . Years of education: Not on file  . Highest education level: Not on file  Occupational History  . Occupation: retired  Scientific laboratory technician  . Financial resource strain: Not on file  . Food insecurity    Worry: Not on file    Inability: Not on file  . Transportation needs    Medical: Not on file    Non-medical:  Not on file  Tobacco Use  . Smoking status: Never Smoker  . Smokeless tobacco: Never Used  Substance and Sexual Activity  . Alcohol use: Yes    Alcohol/week: 1.0 standard drinks    Types: 1 Glasses of wine per week    Comment: social   . Drug use: No  . Sexual activity: Not on file  Lifestyle  . Physical activity    Days per week: Not on file    Minutes per session: Not on file  . Stress: Not on file  Relationships  . Social Herbalist on phone: Not on file    Gets together: Not on file    Attends religious service: Not on file    Active member of club or organization: Not on file    Attends meetings of clubs or organizations: Not on file    Relationship status: Not on file  Other Topics Concern  . Not on file  Social History Narrative  . Not on file    Allergies:  No Known Allergies  Objective:    Vital Signs:   Temp:  [98.2 F (36.8 C)-99.9 F (37.7 C)] 98.2 F (36.8 C) (08/12 0212) Pulse Rate:  [70-95] 82 (08/12 0212) Resp:  [18-21] 18 (08/12 0212) BP: (126-153)/(85-108) 128/85 (08/12 0212) SpO2:  [91 %-99 %] 96 % (08/12 0212) Weight:  [71.7 kg-74.8 kg] 74.8 kg (08/12 0212) Last BM Date: 08/05/19  Weight change: Filed Weights   08/06/19 1548 08/06/19 2128 08/07/19 0212  Weight: 71.7 kg 72.9 kg 74.8 kg     Intake/Output:   Intake/Output Summary (Last 24 hours) at 08/07/2019 0707 Last data filed at 08/07/2019 0230 Gross per 24 hour  Intake 240 ml  Output -  Net 240 ml      Physical Exam    General: . No resp difficulty HEENT: normal Neck: supple. JVP to jaw . Carotids 2+ bilat; no bruits. No lymphadenopathy or thyromegaly appreciated. Cor: PMI nondisplaced. Regular rate & rhythm. No rubs, gallops or murmurs. Lungs: clear Abdomen: soft, nontender, nondistended. No hepatosplenomegaly. No bruits or masses. Good bowel sounds. Extremities: no cyanosis, clubbing, rash, R and LLE 1+edema. L hip erythema  Neuro: alert & orientedx3, cranial nerves grossly intact. moves all 4 extremities w/o difficulty. Affect pleasant   Telemetry   SR 70s   EKG    SR 91 bpm   Labs   Basic Metabolic Panel: Recent Labs  Lab 08/02/19 1239 08/02/19 1259 08/02/19 1344 08/06/19 1516 08/06/19 1807 08/07/19 0525  NA 143 141 145  145 142 141 141  K 3.3* 4.8 3.0*  3.1* 3.4* 4.8 3.7  CL  --  107  --  105 103 105  CO2  --   --   --  23 25 24   GLUCOSE 92 96  --  122* 113* 113*  BUN  --  47*  --  32* 34* 29*  CREATININE  --  1.30*  --  1.62* 1.66* 1.59*  CALCIUM  --   --   --  9.4 9.2 9.3    Liver Function Tests: No results for input(s): AST, ALT, ALKPHOS, BILITOT, PROT, ALBUMIN in the last 168 hours. No results for input(s): LIPASE, AMYLASE in the last 168 hours. No results for input(s): AMMONIA in the last 168 hours.  CBC: Recent Labs  Lab 08/02/19 1239 08/02/19 1259 08/02/19 1344 08/06/19 1807 08/07/19 0525  WBC  --   --   --  16.1* 11.4*  NEUTROABS  --   --   --  12.2*  --   HGB 15.3* 15.0 14.3  14.3 14.8 14.7  HCT 45.0 44.0 42.0  42.0 47.0* 45.0  MCV  --   --   --  100.9* 98.7  PLT  --   --   --  211 194    Cardiac Enzymes: No results for input(s): CKTOTAL, CKMB, CKMBINDEX, TROPONINI in the last 168 hours.  BNP: BNP (last 3 results) Recent Labs    07/30/19 1208  BNP  2,543.3*    ProBNP (last 3 results) No results for input(s): PROBNP in the last 8760 hours.   CBG: No results for input(s): GLUCAP in the last 168 hours.  Coagulation Studies: No results for input(s): LABPROT, INR in the last 72 hours.   Imaging   Dg Chest 1 View  Result Date: 08/06/2019 CLINICAL DATA:  Fall, left hip pain. History hypertension, CAD, CHF, mitral valve disease EXAM: CHEST  1 VIEW COMPARISON:  Most recent radiograph 12/20/2018, CT chest 12/11/2017 FINDINGS: Cardiomegaly with a calcified tortuous aorta similar to prior studies. No consolidation, features of edema, pneumothorax, or effusion. Pulmonary vascularity is normally distributed. No acute osseous or soft tissue abnormality. Degenerative changes are present in the imaged spine and shoulders. Vascular calcium at the carotid bifurcation on the left. IMPRESSION: No acute cardiopulmonary abnormality or acute traumatic finding in the chest Electronically Signed   By: Lovena Le M.D.   On: 08/06/2019 16:44   Ct Head Wo Contrast  Result Date: 08/06/2019 CLINICAL DATA:  Recent fall with headaches, initial encounter EXAM: CT HEAD WITHOUT CONTRAST TECHNIQUE: Contiguous axial images were obtained from the base of the skull through the vertex without intravenous contrast. COMPARISON:  06/06/2017 FINDINGS: Brain: Mild chronic white matter ischemic changes noted. No findings to suggest acute hemorrhage, acute infarction or space-occupying mass lesion. Vascular: No hyperdense vessel or unexpected calcification. Skull: Normal. Negative for fracture or focal lesion. Sinuses/Orbits: No acute finding. Other: Mild scalp hematoma in the left frontal region consistent with the recent injury. IMPRESSION: Scalp hematoma without acute intracranial abnormality. Electronically Signed   By: Inez Catalina M.D.   On: 08/06/2019 17:41   Ct Pelvis Wo Contrast  Result Date: 08/06/2019 CLINICAL DATA:  Fall.  Evaluate for fracture. EXAM: CT PELVIS  WITHOUT CONTRAST TECHNIQUE: Multidetector CT imaging of the pelvis was performed following the standard protocol without intravenous contrast. COMPARISON:  Left hip x-rays from same day. FINDINGS: Urinary Tract:  No abnormality visualized. Bowel:  Sigmoid diverticulosis. Vascular/Lymphatic: Aortoiliac atherosclerosis. No pathologically enlarged lymph nodes. Reproductive:  Prior hysterectomy.  No adnexal mass. Other:  None. Musculoskeletal: Acute fracture of the left femoral neck with foreshortening and apex anterior angulation. Subacute, healing fractures of the left superior and inferior pubic rami with surrounding callus formation. Linear sclerosis in the left sacral ala. No dislocation. IMPRESSION: 1. Acute left femoral neck fracture with foreshortening. 2. Subacute fractures of the left superior and inferior pubic rami as well as the left sacral ala. Electronically Signed   By: Titus Dubin M.D.   On: 08/06/2019 20:34   Dg Hip Unilat With Pelvis 2-3 Views Left  Result Date: 08/06/2019 CLINICAL DATA:  Fall, unable to move left leg EXAM: DG HIP (WITH OR WITHOUT PELVIS) 2-3V LEFT COMPARISON:  None. FINDINGS: Foreshortened transcervical left femoral neck fracture. Additional fractures of left superior and inferior pubic rami as well as disruption of the left sacral arcuate lines suggesting a left sacral fracture as well with hazy periosteal reaction suggesting possible callus formation. No other fracture  or traumatic malalignment is seen. The lower sacrum is poorly visualized due to overlying bowel gas the SI joints and symphysis pubis remain congruent. Degenerative changes are present in both hips and within the lower lumbar spine. Vascular calcium is seen in the pelvis. IMPRESSION: 1. Foreshortened transcervical left femoral neck fracture. 2. Left superior and inferior pubic rami fractures and probable left sacral fracture. More age indeterminate given suggestion of callus formation. 3. Diffuse bone  demineralization. Electronically Signed   By: Lovena Le M.D.   On: 08/06/2019 16:42      Medications:     Current Medications: . furosemide  40 mg Intravenous Q12H  . levothyroxine  112 mcg Oral QAC breakfast  . metoprolol tartrate  50 mg Oral BID  . potassium chloride  20 mEq Oral Daily  . rosuvastatin  20 mg Oral Daily     Infusions:      Assessment/Plan  1. Fall, Left Femoral Neck Fracture  Ortho planning for surgery 8/13. Currently off eliquis for surgery.  Add SCDs for DVT prophylaxis  2. Acute/ Chronic diastolic CHF: Echo in [id number] showed EF 60-65%, mild LVH, normal-appearing RV with PASP 60 mmHg, mild-moderate mitral stenosis. 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  RHC in 8/20 showed elevated right and left heart filling pressures with mixed pulmonary venous/pulmonary arterial hypertension.    - With LVH and prominent CHF,she will eventually need cardiac amyloidosis work up.  Myeloma panel was negative.  We are awaiting PYP scan. - Volume status elevated. Increase lasix to 80 mg IV twice daily.   3. Pulmonary hypertension: Mixed pulmonary venous/pulmonary arterial hypertension on 8/20 RHC.  Suspect PAH component may be due to pulmonary vascular changes in the setting of severe MR over time.  - Treatment for now will be diuresis.   - Once she is well-diuresed, will consider sildenafil use.   4. 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  She would not be a candidate for Mitraclip or mitral valve repair based on the morphology of the valve.  She has seen Dr. Roxy Manns with [facility], would need CABG-MV replacement. This will be a fairly high risk procedure given active CHF, pulmonary hypertension, and deconditioning.  Prior to surgery, she would need better control of CHF as well as physical therapy.   5. Atrial fibrillation: Paroxysmal.  She is  in NSR today.  - off eliquis as noted above.  - Continue metoprolol.   6. PAD: No claudication but legs fatigue easily. Recent ABIs were moderately decreased.  She will be seeing vascular surgery in [place].   7.. Hyperlipidemia: Goal LDL < 70 with vascular disease, LDL was 134 when recently checked.  - Crestor 20 mg daily with lipids/LFTs in 2 months.   8 CKD: Stage 3- Creatinine 1.59  Follow daily BMET   9. CAD: Severe 3 vessel disease on 8/20 cath.    - Ideally, would have CABG with MV replacement.  - No chest pain.  - Continue statin as above.  -off eliquis as above.   Length of Stay: 1  Amy Clegg, NP  08/07/2019, 7:07 AM  Advanced Heart Failure Team Pager [phone] (M-F; 7a - 4p)  Please contact [facility] for night-coverage after hours (4p -7a ) and weekends on [email or web address]  Patient seen with NP, agree with the above note.   Left hip pain this morning but manageable.  Not short  of breath at rest.   General: NAD Neck: JVP 14 cm, no thyromegaly or thyroid nodule.  Lungs: Clear to auscultation bilaterally with normal respiratory effort. CV: Nondisplaced PMI.  Heart regular S1/S2, no S3/S4, 3/6 HSM apex.  1+ ankle edema.   Abdomen: Soft, nontender, no hepatosplenomegaly, no distention.  Skin: Intact without lesions or rashes.  Neurologic: Alert and oriented x 3.  Psych: Normal affect. Extremities: No clubbing or cyanosis.  HEENT: Normal.   Patient is volume overloaded, needs considerable diuresis.   - Lasix 80 mg IV bid today, follow creatinine and I/Os carefully.  - Can resume spironolactone 12.5 daily.  - I think we can cancel PYP scan for now as I expect her CHF is related to mitral regurgitation and CAD.   Eliquis held pre-surgery, will need to restart when stable afterwards.   She will need surgery for left femoral neck fracture.  She is at moderate to high risk due to severe unrevascularized CAD and severe mitral regurgitation with volume overload.  I  think that the surgery is necessary for her, she will need to be mobile to eventually get to cardiac surgery (needs CABG-MVR).   - Continue beta blocker peri-op.  - She will need to be followed closely by cardiology with close attention paid to volume status.   Loralie Champagne 08/07/2019 8:54 AM

## 2019-08-07 NOTE — Progress Notes (Addendum)
PROGRESS NOTE   Caroline [name]  [id number]    DOB: 09/10/1941    DOA: 08/06/2019  PCP: Remi Haggard, FNP   I have briefly reviewed patients previous medical records in [facility].  Chief Complaint  Patient presents with  . Hip Pain  . Fall    Brief Narrative:  78 year old female, PMH of chronic diastolic CHF, paroxysmal A. fib on Eliquis, CVA, HTN, HLD, hypothyroid, CKD 3, recent RHC/LHC 8/20 when diagnosed with severe mixed pulmonary venous/artery artery hypertension, low cardiac output and severe three-vessel coronary artery disease, seen [facility] for MVR/CABG, was at OP cardiologist office for follow-up on day of admission when she tripped while getting out of her wheelchair, fell on her left hip and admitted to the hospital for left femoral neck fracture.  Cardiology and orthopedics consulted.   Assessment & Plan:   Principal Problem:   Fracture of femoral neck, left, closed ([place]) Active Problems:   HTN (hypertension)   Paroxysmal atrial fibrillation (HCC)   Hypothyroidism, unspecified   Chronic diastolic CHF (congestive heart failure) (HCC)   Pulmonary hypertension (HCC)   Mitral regurgitation and mitral stenosis   CKD (chronic kidney disease) stage 3, GFR 30-59 ml/min (HCC)   History of CVA (cerebrovascular accident)   Left femoral neck fracture, left pubic rami fracture and left sacral ala fracture  Sustained status post mechanical fall  Suspect underlying osteoporosis contributing.  Thereby patient will need to undergo bone density study within the next 4 weeks or start a bisphosphonate.  Orthopedic/Dr. Lyla Glassing input 8/11 appreciated and plans surgery 8/13.  Eliquis on hold and last dose was on 8/11 morning.  Cardiology seen for preop cardiac clearance and management of complex cardiac issues.  I discussed with Dr. Aundra Dubin on 8/12.  Patient is at moderate to high risk due to severe unrevascularized CAD, severe MR, pulmonary hypertension, decompensated CHF  and deconditioning.  She is being optimized (diuresed) for needed surgery so that she can eventually be mobile for MVR/CABG.  Continue perioperative beta-blockers and close perioperative monitoring and maintain even in and out volume status.  Nonoperative management for pubic rami and sacral fractures.  Acute on chronic diastolic CHF  TTE 123XX123: LVEF 60-65%, mild to moderate mitral stenosis.  TEE 8/20: Normal EF, rheumatic appearing mitral valve and severe MR/mild MS  RHC 8/20: Findings consistent with mixed pulmonary venous/pulmonary artery hypertension  Cardiology input much appreciated.  Myeloma panel was negative.  Eventually will need cardiac amyloidosis and PYP scan deferred for now.  IV Lasix increased to 80 mg twice daily.  Aldactone 12.5 mg daily resumed.  Intake output and weight charting may be inaccurate.  + 480 mL recorded thus far and weight up by 4 pounds.  Severe three-vessel CAD  Diagnosed by cardiac cath 8/20.  As indicated above, eventual plans for mitral valve replacement and CABG.  Continue metoprolol and rosuvastatin.  Eliquis on hold for surgery.  Mixed pulmonary venous/pulmonary artery hypertension  Noted on recent [place] 8/20  Cardiology considering sildenafil use after diuresis.  Mitral valve disease  TEE 8/20 showed rheumatic appearing valve with severe mitral valve regurgitation and mild mitral stenosis.  Not a candidate for mitral clip or mitral valve repair based on valve morphology.  She has seen Dr. Roxy Manns with [facility] and awaiting MVR/CABG.  Paroxysmal atrial fibrillation  Currently in sinus rhythm  Continue metoprolol  Eliquis held for upcoming surgery.  Last dose of Eliquis was 8/11 morning.  Resume Eliquis post surgery when okay with orthopedics.  Hyperlipidemia  Continue Crestor  Acute kidney injury complicating stage III CKD  Recent Baseline creatinine may be in the 1.3-1.4 range.  Presented with creatinine of 1.62, may be related  to lisinopril, diuretic use and cardiogenic.  Lisinopril held.  Ongoing IV Lasix.  Creatinine slightly better at 1.59.  Monitor closely.  Essential hypertension  Mildly uncontrolled at times.  Continue metoprolol 50 mg twice daily.  Holding home lisinopril due to acute kidney injury and ongoing aggressive IV diuresis.  Hypothyroid Continue Synthroid 112 mcg daily.  History of CVA, type not known Continue rosuvastatin.  Not on aspirin due to being on Eliquis.  Obesity/Body mass index is 30.16 kg/m.  Nausea and Vomiting - one episode this am. Improved after Zofran. Added PO PPI for GI prophylaxis during current stressful situation.  DVT prophylaxis: SCDs Code Status: Full Family Communication: I discussed in detail with patient's spouse via phone, updated care and answered questions.  He was appreciative of the call.  He was informed that the surgery sometime tomorrow afternoon and plans to be here tomorrow morning at around 10:00. Disposition: To be determined pending upcoming left hip surgery and clinical improvement   Consultants:  Cardiology Orthopedics  Procedures:  None  Antimicrobials:  None   Subjective: Patient reports no left hip pain when lying still and some hip pain with movement.  No chest pain or dyspnea reported.  States that she had fallen off of a couple of stairs about 4 weeks ago, had recuperated and had come off her walker about a week prior to this admission.  Objective:  Vitals:   08/06/19 2049 08/06/19 2128 08/07/19 0212 08/07/19 0858  BP: (!) 128/108 126/86 128/85 135/83  Pulse: 92 89 82 73  Resp: (!) 21 20 18    Temp:  99.9 F (37.7 C) 98.2 F (36.8 C) 98.5 F (36.9 C)  TempSrc:  Oral Oral Oral  SpO2: 98% 91% 96% 93%  Weight:  72.9 kg 74.8 kg   Height:  5\' 2"  (1.575 m) 5\' 2"  (1.575 m)     Examination:  General exam: Pleasant elderly female, moderately built and obese lying comfortably propped up in bed without distress. Respiratory  system: Clear to auscultation. Respiratory effort normal. Cardiovascular system: S1 & S2 heard, RRR.  No JVD.  2/6 systolic murmur best heard at apex.  1+ bilateral leg edema.  Telemetry personally reviewed: Sinus rhythm. Gastrointestinal system: Abdomen is nondistended, soft and nontender. No organomegaly or masses felt. Normal bowel sounds heard. Central nervous system: Alert and oriented. No focal neurological deficits. Extremities: Symmetric 5 x 5 power.  Left lower extremity power unable to assess secondary to pain from fracture.  Left lower extremity shortened and externally rotated.  Some old bruising of left calf region from fall sustained 4 weeks ago. Skin: No rashes, lesions or ulcers Psychiatry: Judgement and insight appear normal. Mood & affect appropriate.     Data Reviewed: I have personally reviewed following labs and imaging studies  CBC: Recent Labs  Lab 08/02/19 1239 08/02/19 1259 08/02/19 1344 08/06/19 1807 08/07/19 0525  WBC  --   --   --  16.1* 11.4*  NEUTROABS  --   --   --  12.2*  --   HGB 15.3* 15.0 14.3  14.3 14.8 14.7  HCT 45.0 44.0 42.0  42.0 47.0* 45.0  MCV  --   --   --  100.9* 98.7  PLT  --   --   --  211 194   Basic  Metabolic Panel: Recent Labs  Lab 08/02/19 1239 08/02/19 1259 08/02/19 1344 08/06/19 1516 08/06/19 1807 08/07/19 0525  NA 143 141 145  145 142 141 141  K 3.3* 4.8 3.0*  3.1* 3.4* 4.8 3.7  CL  --  107  --  105 103 105  CO2  --   --   --  23 25 24   GLUCOSE 92 96  --  122* 113* 113*  BUN  --  47*  --  32* 34* 29*  CREATININE  --  1.30*  --  1.62* 1.66* 1.59*  CALCIUM  --   --   --  9.4 9.2 9.3   Liver Function Tests: No results for input(s): AST, ALT, ALKPHOS, BILITOT, PROT, ALBUMIN in the last 168 hours.  Cardiac Enzymes: No results for input(s): CKTOTAL, CKMB, CKMBINDEX, TROPONINI in the last 168 hours.  CBG: No results for input(s): GLUCAP in the last 168 hours.  Recent Results (from the past 240 hour(s))  SARS  CORONAVIRUS 2 Nasal Swab Aptima Multi Swab     Status: None   Collection Time: 07/30/19  1:41 PM   Specimen: Aptima Multi Swab; Nasal Swab  Result Value Ref Range Status   SARS Coronavirus 2 NEGATIVE NEGATIVE Final    Comment: (NOTE) SARS-CoV-2 target nucleic acids are NOT DETECTED. The SARS-CoV-2 RNA is generally detectable in upper and lower respiratory specimens during the acute phase of infection. Negative results do not preclude SARS-CoV-2 infection, do not rule out co-infections with other pathogens, and should not be used as the sole basis for treatment or other patient management decisions. Negative results must be combined with clinical observations, patient history, and epidemiological information. The expected result is Negative. Fact Sheet for Patients: SugarRoll.be Fact Sheet for Healthcare Providers: https://www.woods-mathews.com/ This test is not yet approved or cleared by the [place] FDA and  has been authorized for detection and/or diagnosis of SARS-CoV-2 by FDA under an Emergency Use Authorization (EUA). This EUA will remain  in effect (meaning this test can be used) for the duration of the COVID-19 declaration under Section 56 4(b)(1) of the Act, 21 U.S.C. section 360bbb-3(b)(1), unless the authorization is terminated or revoked sooner. Performed at [facility], [place] [address] [place], [place] [address]   SARS Coronavirus 2 [facility] order, Performed in [facility] hospital lab)     Status: None   Collection Time: 08/07/19  8:12 AM  Result Value Ref Range Status   SARS Coronavirus 2 NEGATIVE NEGATIVE Final    Comment: (NOTE) If result is NEGATIVE SARS-CoV-2 target nucleic acids are NOT DETECTED. The SARS-CoV-2 RNA is generally detectable in upper and lower  respiratory specimens during the acute phase of infection. The lowest  concentration of SARS-CoV-2 viral copies this assay can detect is 250  copies /  mL. A negative result does not preclude SARS-CoV-2 infection  and should not be used as the sole basis for treatment or other  patient management decisions.  A negative result may occur with  improper specimen collection / handling, submission of specimen other  than nasopharyngeal swab, presence of viral mutation(s) within the  areas targeted by this assay, and inadequate number of viral copies  (<250 copies / mL). A negative result must be combined with clinical  observations, patient history, and epidemiological information. If result is POSITIVE SARS-CoV-2 target nucleic acids are DETECTED. The SARS-CoV-2 RNA is generally detectable in upper and lower  respiratory specimens dur ing the acute phase of infection.  Positive  results are indicative of active infection with SARS-CoV-2.  Clinical  correlation with patient history and other diagnostic information is  necessary to determine patient infection status.  Positive results do  not rule out bacterial infection or co-infection with other viruses. If result is PRESUMPTIVE POSTIVE SARS-CoV-2 nucleic acids MAY BE PRESENT.   A presumptive positive result was obtained on the submitted specimen  and confirmed on repeat testing.  While 2019 novel coronavirus  (SARS-CoV-2) nucleic acids may be present in the submitted sample  additional confirmatory testing may be necessary for epidemiological  and / or clinical management purposes  to differentiate between  SARS-CoV-2 and other Sarbecovirus currently known to infect humans.  If clinically indicated additional testing with an alternate test  methodology [phone]) is advised. The SARS-CoV-2 RNA is generally  detectable in upper and lower respiratory sp ecimens during the acute  phase of infection. The expected result is Negative. Fact Sheet for Patients:  StrictlyIdeas.no Fact Sheet for Healthcare Providers: BankingDealers.co.za This test is  not yet approved or cleared by the [place] FDA and has been authorized for detection and/or diagnosis of SARS-CoV-2 by FDA under an Emergency Use Authorization (EUA).  This EUA will remain in effect (meaning this test can be used) for the duration of the COVID-19 declaration under Section 564(b)(1) of the Act, 21 U.S.C. section 360bbb-3(b)(1), unless the authorization is terminated or revoked sooner. Performed at [facility], [place] [address], [place], [place] [address]          Radiology Studies: Dg Chest 1 View  Result Date: 08/06/2019 CLINICAL DATA:  Fall, left hip pain. History hypertension, CAD, CHF, mitral valve disease EXAM: CHEST  1 VIEW COMPARISON:  Most recent radiograph 12/20/2018, CT chest 12/11/2017 FINDINGS: Cardiomegaly with a calcified tortuous aorta similar to prior studies. No consolidation, features of edema, pneumothorax, or effusion. Pulmonary vascularity is normally distributed. No acute osseous or soft tissue abnormality. Degenerative changes are present in the imaged spine and shoulders. Vascular calcium at the carotid bifurcation on the left. IMPRESSION: No acute cardiopulmonary abnormality or acute traumatic finding in the chest Electronically Signed   By: Lovena Le M.D.   On: 08/06/2019 16:44   Ct Head Wo Contrast  Result Date: 08/06/2019 CLINICAL DATA:  Recent fall with headaches, initial encounter EXAM: CT HEAD WITHOUT CONTRAST TECHNIQUE: Contiguous axial images were obtained from the base of the skull through the vertex without intravenous contrast. COMPARISON:  06/06/2017 FINDINGS: Brain: Mild chronic white matter ischemic changes noted. No findings to suggest acute hemorrhage, acute infarction or space-occupying mass lesion. Vascular: No hyperdense vessel or unexpected calcification. Skull: Normal. Negative for fracture or focal lesion. Sinuses/Orbits: No acute finding. Other: Mild scalp hematoma in the left frontal region consistent with the  recent injury. IMPRESSION: Scalp hematoma without acute intracranial abnormality. Electronically Signed   By: Inez Catalina M.D.   On: 08/06/2019 17:41   Ct Pelvis Wo Contrast  Result Date: 08/06/2019 CLINICAL DATA:  Fall.  Evaluate for fracture. EXAM: CT PELVIS WITHOUT CONTRAST TECHNIQUE: Multidetector CT imaging of the pelvis was performed following the standard protocol without intravenous contrast. COMPARISON:  Left hip x-rays from same day. FINDINGS: Urinary Tract:  No abnormality visualized. Bowel:  Sigmoid diverticulosis. Vascular/Lymphatic: Aortoiliac atherosclerosis. No pathologically enlarged lymph nodes. Reproductive:  Prior hysterectomy.  No adnexal mass. Other:  None. Musculoskeletal: Acute fracture of the left femoral neck with foreshortening and apex anterior angulation. Subacute, healing fractures of the left superior and inferior pubic rami with  surrounding callus formation. Linear sclerosis in the left sacral ala. No dislocation. IMPRESSION: 1. Acute left femoral neck fracture with foreshortening. 2. Subacute fractures of the left superior and inferior pubic rami as well as the left sacral ala. Electronically Signed   By: Titus Dubin M.D.   On: 08/06/2019 20:34   Dg Hip Unilat With Pelvis 2-3 Views Left  Result Date: 08/06/2019 CLINICAL DATA:  Fall, unable to move left leg EXAM: DG HIP (WITH OR WITHOUT PELVIS) 2-3V LEFT COMPARISON:  None. FINDINGS: Foreshortened transcervical left femoral neck fracture. Additional fractures of left superior and inferior pubic rami as well as disruption of the left sacral arcuate lines suggesting a left sacral fracture as well with hazy periosteal reaction suggesting possible callus formation. No other fracture or traumatic malalignment is seen. The lower sacrum is poorly visualized due to overlying bowel gas the SI joints and symphysis pubis remain congruent. Degenerative changes are present in both hips and within the lower lumbar spine. Vascular  calcium is seen in the pelvis. IMPRESSION: 1. Foreshortened transcervical left femoral neck fracture. 2. Left superior and inferior pubic rami fractures and probable left sacral fracture. More age indeterminate given suggestion of callus formation. 3. Diffuse bone demineralization. Electronically Signed   By: Lovena Le M.D.   On: 08/06/2019 16:42        Scheduled Meds: . furosemide  80 mg Intravenous BID  . levothyroxine  112 mcg Oral QAC breakfast  . metoprolol tartrate  50 mg Oral BID  . potassium chloride  20 mEq Oral Daily  . rosuvastatin  20 mg Oral Daily  . spironolactone  12.5 mg Oral Daily   Continuous Infusions:   LOS: 1 day     Vernell Leep, MD, FACP, [facility]. [facility]  To contact the attending provider between 7A-7P or the covering provider during after hours 7P-7A, please log into the web site [email or web address] and access using universal [place] password for that web site. If you do not have the password, please call the hospital operator.  08/07/2019, 1:50 PM

## 2019-08-08 ENCOUNTER — Encounter (HOSPITAL_COMMUNITY): Payer: Self-pay | Admitting: *Deleted

## 2019-08-08 ENCOUNTER — Inpatient Hospital Stay (HOSPITAL_COMMUNITY): Payer: Medicare Other

## 2019-08-08 ENCOUNTER — Inpatient Hospital Stay (HOSPITAL_COMMUNITY): Payer: Medicare Other | Admitting: Certified Registered Nurse Anesthetist

## 2019-08-08 ENCOUNTER — Encounter (HOSPITAL_COMMUNITY)
Admission: EM | Disposition: A | Discharge status: Home / Self Care | Payer: Self-pay | Source: Home / Self Care | Attending: Internal Medicine

## 2019-08-08 ENCOUNTER — Inpatient Hospital Stay (HOSPITAL_COMMUNITY): Admission: RE | Admit: 2019-08-08 | Payer: Medicare Other | Source: Ambulatory Visit

## 2019-08-08 DIAGNOSIS — J9601 Acute respiratory failure with hypoxia: Secondary | ICD-10-CM

## 2019-08-08 HISTORY — PX: TOTAL HIP ARTHROPLASTY: SHX124

## 2019-08-08 LAB — CBC
HCT: 43.4 % (ref 36.0–46.0)
Hemoglobin: 13.9 g/dL (ref 12.0–15.0)
MCH: 31.7 pg (ref 26.0–34.0)
MCHC: 32 g/dL (ref 30.0–36.0)
MCV: 99.1 fL (ref 80.0–100.0)
Platelets: 189 10*3/uL (ref 150–400)
RBC: 4.38 MIL/uL (ref 3.87–5.11)
RDW: 16.8 % — ABNORMAL HIGH (ref 11.5–15.5)
WBC: 12.1 10*3/uL — ABNORMAL HIGH (ref 4.0–10.5)
nRBC: 0 % (ref 0.0–0.2)

## 2019-08-08 LAB — BASIC METABOLIC PANEL
Anion gap: 10 (ref 5–15)
BUN: 30 mg/dL — ABNORMAL HIGH (ref 8–23)
CO2: 25 mmol/L (ref 22–32)
Calcium: 8.8 mg/dL — ABNORMAL LOW (ref 8.9–10.3)
Chloride: 103 mmol/L (ref 98–111)
Creatinine, Ser: 1.69 mg/dL — ABNORMAL HIGH (ref 0.44–1.00)
GFR calc Af Amer: 33 mL/min — ABNORMAL LOW (ref >60)
GFR calc non Af Amer: 29 mL/min — ABNORMAL LOW (ref >60)
Glucose, Bld: 110 mg/dL — ABNORMAL HIGH (ref 70–99)
Potassium: 3.7 mmol/L (ref 3.5–5.1)
Sodium: 138 mmol/L (ref 135–145)

## 2019-08-08 SURGERY — ARTHROPLASTY, HIP, TOTAL, ANTERIOR APPROACH
Anesthesia: Choice | Laterality: Left

## 2019-08-08 SURGERY — ARTHROPLASTY, HIP, TOTAL, ANTERIOR APPROACH
Anesthesia: General | Site: Hip | Laterality: Left

## 2019-08-08 MED ORDER — SODIUM CHLORIDE 0.9 % IV SOLN
INTRAVENOUS | Status: DC | PRN
  Administered 2019-08-08: 19:00:00 50 ug/min via INTRAVENOUS

## 2019-08-08 MED ORDER — POTASSIUM CHLORIDE CRYS ER 20 MEQ PO TBCR
30.0000 meq | EXTENDED_RELEASE_TABLET | Freq: Once | ORAL | Status: AC
  Administered 2019-08-08: 30 meq via ORAL
  Filled 2019-08-08: qty 1

## 2019-08-08 MED ORDER — LACTATED RINGERS IV SOLN
INTRAVENOUS | Status: DC
  Administered 2019-08-08: 18:00:00 via INTRAVENOUS

## 2019-08-08 MED ORDER — SUCCINYLCHOLINE CHLORIDE 20 MG/ML IJ SOLN
INTRAMUSCULAR | Status: DC | PRN
  Administered 2019-08-08: 100 mg via INTRAVENOUS

## 2019-08-08 MED ORDER — BUPIVACAINE HCL (PF) 0.5 % IJ SOLN
INTRAMUSCULAR | Status: AC
  Filled 2019-08-08: qty 30

## 2019-08-08 MED ORDER — LIDOCAINE 2% (20 MG/ML) 5 ML SYRINGE
INTRAMUSCULAR | Status: DC | PRN
  Administered 2019-08-08: 80 mg via INTRAVENOUS

## 2019-08-08 MED ORDER — MIDAZOLAM HCL 2 MG/2ML IJ SOLN
INTRAMUSCULAR | Status: AC
  Filled 2019-08-08: qty 2

## 2019-08-08 MED ORDER — KETOROLAC TROMETHAMINE 30 MG/ML IJ SOLN
INTRAMUSCULAR | Status: AC
  Filled 2019-08-08: qty 1

## 2019-08-08 MED ORDER — DEXAMETHASONE SODIUM PHOSPHATE 10 MG/ML IJ SOLN
INTRAMUSCULAR | Status: DC | PRN
  Administered 2019-08-08: 5 mg via INTRAVENOUS

## 2019-08-08 MED ORDER — CEFAZOLIN SODIUM-DEXTROSE 2-3 GM-%(50ML) IV SOLR
INTRAVENOUS | Status: DC | PRN
  Administered 2019-08-08: 2 g via INTRAVENOUS

## 2019-08-08 MED ORDER — ONDANSETRON HCL 4 MG PO TABS: 4.0000 mg | ORAL_TABLET | Freq: Four times a day (QID) | ORAL | Status: DC | PRN

## 2019-08-08 MED ORDER — LABETALOL HCL 5 MG/ML IV SOLN
INTRAVENOUS | Status: AC
  Filled 2019-08-08: qty 4

## 2019-08-08 MED ORDER — ETOMIDATE 2 MG/ML IV SOLN
INTRAVENOUS | Status: DC | PRN
  Administered 2019-08-08: 18 mg via INTRAVENOUS

## 2019-08-08 MED ORDER — DEXAMETHASONE SODIUM PHOSPHATE 10 MG/ML IJ SOLN
INTRAMUSCULAR | Status: AC
  Filled 2019-08-08: qty 1

## 2019-08-08 MED ORDER — METOCLOPRAMIDE HCL 5 MG PO TABS: 5.0000 mg | ORAL_TABLET | Freq: Three times a day (TID) | ORAL | Status: DC | PRN

## 2019-08-08 MED ORDER — BUPIVACAINE-EPINEPHRINE (PF) 0.5% -1:200000 IJ SOLN
INTRAMUSCULAR | Status: DC | PRN
  Administered 2019-08-08: 30 mL

## 2019-08-08 MED ORDER — DOCUSATE SODIUM 100 MG PO CAPS
100.0000 mg | ORAL_CAPSULE | Freq: Two times a day (BID) | ORAL | Status: DC
  Administered 2019-08-09 – 2019-08-12 (×7): 100 mg via ORAL
  Filled 2019-08-08 (×7): qty 1

## 2019-08-08 MED ORDER — PROPOFOL 10 MG/ML IV BOLUS
INTRAVENOUS | Status: DC | PRN
  Administered 2019-08-08: 20 mg via INTRAVENOUS

## 2019-08-08 MED ORDER — APIXABAN 2.5 MG PO TABS
2.5000 mg | ORAL_TABLET | Freq: Two times a day (BID) | ORAL | Status: AC
  Administered 2019-08-09 – 2019-08-11 (×6): 2.5 mg via ORAL
  Filled 2019-08-08 (×6): qty 1

## 2019-08-08 MED ORDER — FENTANYL CITRATE (PF) 100 MCG/2ML IJ SOLN
25.0000 ug | INTRAMUSCULAR | Status: DC | PRN
  Administered 2019-08-08: 22:00:00 25 ug via INTRAVENOUS

## 2019-08-08 MED ORDER — HYDROCODONE-ACETAMINOPHEN 7.5-325 MG PO TABS
1.0000 | ORAL_TABLET | ORAL | Status: DC | PRN
  Administered 2019-08-09 – 2019-08-10 (×2): 1 via ORAL
  Administered 2019-08-11 – 2019-08-12 (×2): 2 via ORAL
  Filled 2019-08-08 (×2): qty 1
  Filled 2019-08-08 (×2): qty 2

## 2019-08-08 MED ORDER — FENTANYL CITRATE (PF) 100 MCG/2ML IJ SOLN
INTRAMUSCULAR | Status: DC | PRN
  Administered 2019-08-08 (×2): 50 ug via INTRAVENOUS
  Administered 2019-08-08: 150 ug via INTRAVENOUS

## 2019-08-08 MED ORDER — METOLAZONE 5 MG PO TABS
2.5000 mg | ORAL_TABLET | Freq: Once | ORAL | Status: AC
  Administered 2019-08-08: 11:00:00 2.5 mg via ORAL
  Filled 2019-08-08: qty 1

## 2019-08-08 MED ORDER — BUPIVACAINE-EPINEPHRINE 0.5% -1:200000 IJ SOLN
INTRAMUSCULAR | Status: AC
  Filled 2019-08-08: qty 1

## 2019-08-08 MED ORDER — ROCURONIUM BROMIDE 10 MG/ML (PF) SYRINGE
PREFILLED_SYRINGE | INTRAVENOUS | Status: AC
  Filled 2019-08-08: qty 10

## 2019-08-08 MED ORDER — FENTANYL CITRATE (PF) 100 MCG/2ML IJ SOLN
INTRAMUSCULAR | Status: AC
  Filled 2019-08-08: qty 2

## 2019-08-08 MED ORDER — 0.9 % SODIUM CHLORIDE (POUR BTL) OPTIME
TOPICAL | Status: DC | PRN
  Administered 2019-08-08: 1000 mL

## 2019-08-08 MED ORDER — TRANEXAMIC ACID-NACL 1000-0.7 MG/100ML-% IV SOLN
INTRAVENOUS | Status: AC
  Filled 2019-08-08: qty 100

## 2019-08-08 MED ORDER — FENTANYL CITRATE (PF) 250 MCG/5ML IJ SOLN
INTRAMUSCULAR | Status: AC
  Filled 2019-08-08: qty 5

## 2019-08-08 MED ORDER — ETOMIDATE 2 MG/ML IV SOLN
INTRAVENOUS | Status: AC
  Filled 2019-08-08: qty 10

## 2019-08-08 MED ORDER — CEFAZOLIN SODIUM-DEXTROSE 2-4 GM/100ML-% IV SOLN
2.0000 g | Freq: Four times a day (QID) | INTRAVENOUS | Status: AC
  Administered 2019-08-09 (×2): 2 g via INTRAVENOUS
  Filled 2019-08-08 (×2): qty 100

## 2019-08-08 MED ORDER — METOCLOPRAMIDE HCL 5 MG/ML IJ SOLN: 5.0000 mg | Freq: Three times a day (TID) | INTRAMUSCULAR | Status: DC | PRN

## 2019-08-08 MED ORDER — ONDANSETRON HCL 4 MG/2ML IJ SOLN: 4.0000 mg | Freq: Four times a day (QID) | INTRAMUSCULAR | Status: DC | PRN

## 2019-08-08 MED ORDER — HYDROCODONE-ACETAMINOPHEN 5-325 MG PO TABS
1.0000 | ORAL_TABLET | ORAL | Status: DC | PRN
  Administered 2019-08-09 – 2019-08-10 (×4): 2 via ORAL
  Administered 2019-08-11: 21:00:00 1 via ORAL
  Administered 2019-08-12: 15:00:00 2 via ORAL
  Filled 2019-08-08 (×2): qty 2
  Filled 2019-08-08: qty 1
  Filled 2019-08-08 (×3): qty 2

## 2019-08-08 MED ORDER — SUGAMMADEX SODIUM 200 MG/2ML IV SOLN
INTRAVENOUS | Status: DC | PRN
  Administered 2019-08-08: 150 mg via INTRAVENOUS

## 2019-08-08 MED ORDER — LIDOCAINE 2% (20 MG/ML) 5 ML SYRINGE
INTRAMUSCULAR | Status: AC
  Filled 2019-08-08: qty 5

## 2019-08-08 MED ORDER — SODIUM CHLORIDE 0.9 % IR SOLN
Status: DC | PRN
  Administered 2019-08-08: 1000 mL

## 2019-08-08 MED ORDER — KETOROLAC TROMETHAMINE 30 MG/ML IJ SOLN
INTRAMUSCULAR | Status: DC | PRN
  Administered 2019-08-08: 30 mg

## 2019-08-08 MED ORDER — ONDANSETRON HCL 4 MG/2ML IJ SOLN: 4.0000 mg | Freq: Once | INTRAMUSCULAR | Status: DC | PRN

## 2019-08-08 MED ORDER — MENTHOL 3 MG MT LOZG: 1.0000 | LOZENGE | OROMUCOSAL | Status: DC | PRN

## 2019-08-08 MED ORDER — ONDANSETRON HCL 4 MG/2ML IJ SOLN
INTRAMUSCULAR | Status: AC
  Filled 2019-08-08: qty 2

## 2019-08-08 MED ORDER — SUCCINYLCHOLINE CHLORIDE 200 MG/10ML IV SOSY
PREFILLED_SYRINGE | INTRAVENOUS | Status: AC
  Filled 2019-08-08: qty 10

## 2019-08-08 MED ORDER — PHENYLEPHRINE 40 MCG/ML (10ML) SYRINGE FOR IV PUSH (FOR BLOOD PRESSURE SUPPORT)
PREFILLED_SYRINGE | INTRAVENOUS | Status: AC
  Filled 2019-08-08: qty 10

## 2019-08-08 MED ORDER — PHENOL 1.4 % MT LIQD: 1.0000 | OROMUCOSAL | Status: DC | PRN

## 2019-08-08 MED ORDER — PHENYLEPHRINE HCL (PRESSORS) 10 MG/ML IV SOLN
INTRAVENOUS | Status: DC | PRN
  Administered 2019-08-08: 120 ug via INTRAVENOUS
  Administered 2019-08-08 (×2): 40 ug via INTRAVENOUS

## 2019-08-08 MED ORDER — ACETAMINOPHEN 325 MG PO TABS: 325.0000 mg | ORAL_TABLET | Freq: Four times a day (QID) | ORAL | Status: DC | PRN

## 2019-08-08 MED ORDER — SODIUM CHLORIDE (PF) 0.9 % IJ SOLN
INTRAMUSCULAR | Status: DC | PRN
  Administered 2019-08-08: 30 mL

## 2019-08-08 MED ORDER — PROPOFOL 10 MG/ML IV BOLUS
INTRAVENOUS | Status: AC
  Filled 2019-08-08: qty 20

## 2019-08-08 MED ORDER — MORPHINE SULFATE (PF) 2 MG/ML IV SOLN: 0.5000 mg | INTRAVENOUS | Status: DC | PRN

## 2019-08-08 MED ORDER — TRANEXAMIC ACID-NACL 1000-0.7 MG/100ML-% IV SOLN
1000.0000 mg | INTRAVENOUS | Status: AC
  Administered 2019-08-08: 20:00:00 1000 mg via INTRAVENOUS

## 2019-08-08 MED ORDER — ESMOLOL HCL 100 MG/10ML IV SOLN
INTRAVENOUS | Status: AC
  Filled 2019-08-08: qty 10

## 2019-08-08 MED ORDER — ROCURONIUM BROMIDE 50 MG/5ML IV SOSY
PREFILLED_SYRINGE | INTRAVENOUS | Status: DC | PRN
  Administered 2019-08-08: 50 mg via INTRAVENOUS

## 2019-08-08 SURGICAL SUPPLY — 68 items
ADH SKN CLS APL DERMABOND .7 (GAUZE/BANDAGES/DRESSINGS) ×1
ALCOHOL 70% 16 OZ (MISCELLANEOUS) ×3 IMPLANT
APL PRP STRL LF DISP 70% ISPRP (MISCELLANEOUS) ×1
BLADE CLIPPER SURG (BLADE) IMPLANT
CHLORAPREP W/TINT 26 (MISCELLANEOUS) ×3 IMPLANT
COVER SURGICAL LIGHT HANDLE (MISCELLANEOUS) ×3 IMPLANT
COVER WAND RF STERILE (DRAPES) ×3 IMPLANT
CUP SECTOR GRIPTON 50MM (Cup) ×2 IMPLANT
DERMABOND ADVANCED (GAUZE/BANDAGES/DRESSINGS) ×2
DERMABOND ADVANCED .7 DNX12 (GAUZE/BANDAGES/DRESSINGS) ×2 IMPLANT
DRAPE C-ARM 42X72 X-RAY (DRAPES) ×3 IMPLANT
DRAPE STERI IOBAN 125X83 (DRAPES) ×3 IMPLANT
DRAPE U-SHAPE 47X51 STRL (DRAPES) ×9 IMPLANT
DRESSING AQUACEL AG SP 3.5X10 (GAUZE/BANDAGES/DRESSINGS) IMPLANT
DRSG AQUACEL AG ADV 3.5X10 (GAUZE/BANDAGES/DRESSINGS) ×3 IMPLANT
DRSG AQUACEL AG SP 3.5X10 (GAUZE/BANDAGES/DRESSINGS) ×3
ELECT BLADE 4.0 EZ CLEAN MEGAD (MISCELLANEOUS) ×3
ELECT PENCIL ROCKER SW 15FT (MISCELLANEOUS) ×3 IMPLANT
ELECT REM PT RETURN 9FT ADLT (ELECTROSURGICAL) ×3
ELECTRODE BLDE 4.0 EZ CLN MEGD (MISCELLANEOUS) ×1 IMPLANT
ELECTRODE REM PT RTRN 9FT ADLT (ELECTROSURGICAL) ×1 IMPLANT
EVACUATOR 1/8 PVC DRAIN (DRAIN) IMPLANT
GLOVE BIO SURGEON STRL SZ8.5 (GLOVE) ×6 IMPLANT
GLOVE BIOGEL PI IND STRL 8.5 (GLOVE) ×1 IMPLANT
GLOVE BIOGEL PI INDICATOR 8.5 (GLOVE) ×2
GOWN STRL REUS W/ TWL LRG LVL3 (GOWN DISPOSABLE) ×2 IMPLANT
GOWN STRL REUS W/TWL 2XL LVL3 (GOWN DISPOSABLE) ×3 IMPLANT
GOWN STRL REUS W/TWL LRG LVL3 (GOWN DISPOSABLE) ×6
HANDPIECE INTERPULSE COAX TIP (DISPOSABLE) ×3
HEAD FEM STD 32X+1 STRL (Hips) IMPLANT
HEAD FEM STD 32X+5 STRL (Hips) ×2 IMPLANT
HOOD PEEL AWAY FACE SHEILD DIS (HOOD) ×6 IMPLANT
JET LAVAGE IRRISEPT WOUND (IRRIGATION / IRRIGATOR) ×3
KIT BASIN OR (CUSTOM PROCEDURE TRAY) ×3 IMPLANT
KIT TURNOVER KIT B (KITS) ×3 IMPLANT
LAVAGE JET IRRISEPT WOUND (IRRIGATION / IRRIGATOR) IMPLANT
LINER ACET PNNCL PLUS4 NEUTRAL (Hips) IMPLANT
MANIFOLD NEPTUNE II (INSTRUMENTS) ×3 IMPLANT
MARKER SKIN DUAL TIP RULER LAB (MISCELLANEOUS) ×6 IMPLANT
NDL SPNL 18GX3.5 QUINCKE PK (NEEDLE) ×1 IMPLANT
NEEDLE SPNL 18GX3.5 QUINCKE PK (NEEDLE) ×3 IMPLANT
NS IRRIG 1000ML POUR BTL (IV SOLUTION) ×3 IMPLANT
PACK TOTAL JOINT (CUSTOM PROCEDURE TRAY) ×3 IMPLANT
PACK UNIVERSAL I (CUSTOM PROCEDURE TRAY) ×3 IMPLANT
PAD ARMBOARD 7.5X6 YLW CONV (MISCELLANEOUS) ×6 IMPLANT
PINNACLE PLUS 4 NEUTRAL (Hips) ×3 IMPLANT
SAW OSC TIP CART 19.5X105X1.3 (SAW) ×3 IMPLANT
SCREW 6.5MMX35MM (Screw) ×2 IMPLANT
SEALER BIPOLAR AQUA 6.0 (INSTRUMENTS) IMPLANT
SET HNDPC FAN SPRY TIP SCT (DISPOSABLE) ×1 IMPLANT
SOL PREP POV-IOD 4OZ 10% (MISCELLANEOUS) ×3 IMPLANT
STEM TRI LOC BPS SZ4 W GRIPTON (Hips) IMPLANT
SUT ETHIBOND NAB CT1 #1 30IN (SUTURE) ×6 IMPLANT
SUT MNCRL AB 3-0 PS2 18 (SUTURE) ×3 IMPLANT
SUT MON AB 2-0 CT1 36 (SUTURE) ×3 IMPLANT
SUT VIC AB 1 CT1 27 (SUTURE) ×3
SUT VIC AB 1 CT1 27XBRD ANBCTR (SUTURE) ×1 IMPLANT
SUT VIC AB 2-0 CT1 27 (SUTURE) ×3
SUT VIC AB 2-0 CT1 TAPERPNT 27 (SUTURE) ×1 IMPLANT
SUT VLOC 180 0 24IN GS25 (SUTURE) ×3 IMPLANT
SYR 50ML LL SCALE MARK (SYRINGE) ×3 IMPLANT
TOWEL GREEN STERILE (TOWEL DISPOSABLE) ×3 IMPLANT
TOWEL GREEN STERILE FF (TOWEL DISPOSABLE) ×3 IMPLANT
TRAY CATH 16FR W/PLASTIC CATH (SET/KITS/TRAYS/PACK) IMPLANT
TRAY FOLEY W/BAG SLVR 16FR (SET/KITS/TRAYS/PACK)
TRAY FOLEY W/BAG SLVR 16FR ST (SET/KITS/TRAYS/PACK) IMPLANT
TRI LOC BPS SZ 4 W GRIPTON (Hips) ×3 IMPLANT
WATER STERILE IRR 1000ML POUR (IV SOLUTION) ×9 IMPLANT

## 2019-08-08 NOTE — Progress Notes (Signed)
Advanced Heart Failure Rounding Note  PCP-Cardiologist: Ida Rogue, MD   Subjective:    Patient seems to have diuresed well yesterday on her report, unfortunately I/Os were incomplete.  No chest pain, no dyspnea at rest.    Objective:   Weight Range: 76.8 kg Body mass index is 30.98 kg/m.   Vital Signs:   Temp:  [97.5 F (36.4 C)-98.7 F (37.1 C)] 97.8 F (36.6 C) (08/13 0429) Pulse Rate:  [65-82] 68 (08/13 0429) BP: (91-139)/(58-88) 128/88 (08/13 0429) SpO2:  [85 %-100 %] 97 % (08/13 0429) Weight:  [76.8 kg] 76.8 kg (08/13 0429) Last BM Date: 08/06/19  Weight change: Filed Weights   08/06/19 2128 08/07/19 0212 08/08/19 0429  Weight: 72.9 kg 74.8 kg 76.8 kg    Intake/Output:   Intake/Output Summary (Last 24 hours) at 08/08/2019 0934 Last data filed at 08/08/2019 0600 Gross per 24 hour  Intake 720 ml  Output 600 ml  Net 120 ml      Physical Exam    General:  Well appearing. No resp difficulty HEENT: Normal Neck: Supple. JVP 14 cm. Carotids 2+ bilat; no bruits. No lymphadenopathy or thyromegaly appreciated. Cor: PMI nondisplaced. Regular rate & rhythm. 3/6 HSM apex.  Lungs: Clear Abdomen: Soft, nontender, nondistended. No hepatosplenomegaly. No bruits or masses. Good bowel sounds. Extremities: No cyanosis, clubbing, rash. 1+ ankle edema.  Neuro: Alert & orientedx3, cranial nerves grossly intact. moves all 4 extremities w/o difficulty. Affect pleasant   Telemetry   NSR 70s, personally reviewed.   Labs    CBC Recent Labs    08/06/19 1807 08/07/19 0525 08/08/19 0637  WBC 16.1* 11.4* 12.1*  NEUTROABS 12.2*  --   --   HGB 14.8 14.7 13.9  HCT 47.0* 45.0 43.4  MCV 100.9* 98.7 99.1  PLT 211 194 [id number]   Basic Metabolic Panel Recent Labs    08/07/19 0525 08/08/19 0637  NA 141 138  K 3.7 3.7  CL 105 103  CO2 24 25  GLUCOSE 113* 110*  BUN 29* 30*  CREATININE 1.59* 1.69*  CALCIUM 9.3 8.8*   Liver Function Tests No results for input(s):  AST, ALT, ALKPHOS, BILITOT, PROT, ALBUMIN in the last 72 hours. No results for input(s): LIPASE, AMYLASE in the last 72 hours. Cardiac Enzymes No results for input(s): CKTOTAL, CKMB, CKMBINDEX, TROPONINI in the last 72 hours.  BNP: BNP (last 3 results) Recent Labs    07/30/19 1208  BNP 2,543.3*    ProBNP (last 3 results) No results for input(s): PROBNP in the last 8760 hours.   D-Dimer No results for input(s): DDIMER in the last 72 hours. Hemoglobin A1C No results for input(s): HGBA1C in the last 72 hours. Fasting Lipid Panel No results for input(s): CHOL, HDL, LDLCALC, TRIG, CHOLHDL, LDLDIRECT in the last 72 hours. Thyroid Function Tests No results for input(s): TSH, T4TOTAL, T3FREE, THYROIDAB in the last 72 hours.  Invalid input(s): FREET3  Other results:   Imaging    Dg Knee Left Port  Result Date: 08/07/2019 CLINICAL DATA:  Preop left femoral neck fracture, prior left knee replacement EXAM: PORTABLE LEFT KNEE - 1-2 VIEW COMPARISON:  None. FINDINGS: Left knee arthroplasty, without evidence of complication. No fracture or dislocation is seen. Visualized soft tissues are within normal limits. No suprapatellar knee joint effusion. IMPRESSION: Left knee arthroplasty, without evidence of complication. No fracture or dislocation is seen. Electronically Signed   By: Julian Hy M.D.   On: 08/07/2019 19:11  Medications:     Scheduled Medications: . chlorhexidine  60 mL Topical Once  . feeding supplement  296 mL Oral Once  . furosemide  80 mg Intravenous BID  . levothyroxine  112 mcg Oral QAC breakfast  . metolazone  2.5 mg Oral Once  . metoprolol tartrate  50 mg Oral BID  . pantoprazole  40 mg Oral Daily  . potassium chloride  30 mEq Oral Once  . potassium chloride  20 mEq Oral Daily  . povidone-iodine  2 application Topical Once  . rosuvastatin  20 mg Oral Daily  . spironolactone  12.5 mg Oral Daily     Infusions: .  ceFAZolin (ANCEF) IV    .  tranexamic acid       PRN Medications:  acetaminophen, HYDROcodone-acetaminophen, morphine injection, ondansetron (ZOFRAN) IV, senna-docusate   Assessment/Plan   1. Acute on chronic diastolic CHF: Echo in 123XX123 showed EF 60-65%, mild LVH, normal-appearing RV with PASP 60 mmHg, mild-moderate mitral stenosis. 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  RHC in 8/20 showed elevated right and left heart filling pressures with mixed pulmonary venous/pulmonary arterial hypertension. She has been diuresed with IV Lasix, remains volume overloaded on exam.  - Continue Lasix 80 mg IV bid and will give a dose of metolazone 2.5 today.  Replace K.   - Continue spironolactone 12.5 daily.  - I think we can cancel PYP scan for now as I expect her CHF is related to mitral regurgitation and CAD.  2. Pulmonary hypertension: Mixed pulmonary venous/pulmonary arterial hypertension on 8/20 RHC.  Suspect PAH component may be due to pulmonary vascular changes in the setting of severe MR over time.  - Treatment for now will be diuresis.   - Once she is well-diuresed, will consider sildenafil use.  3. 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  She would not be a candidate for Mitraclip or mitral valve repair based on the morphology of the valve.  She has seen Dr. Roxy Manns with [facility], would need CABG-MV replacement. This will be a fairly high risk procedure given active CHF, pulmonary hypertension, and deconditioning.  Prior to surgery, she would need better control of CHF as well as physical therapy.  4. Atrial fibrillation: Paroxysmal.  She is in NSR today.  - Eliquis on hold for surgery today, restart when after surgery when ok with surgeon.  - Continue metoprolol.  5. PAD: No claudication but legs fatigue easily. Recent ABIs were moderately decreased.  She will be seeing vascular surgery in  [place].  6. Hyperlipidemia: Goal LDL < 70 with vascular disease, LDL was 134 when recently checked.  - Statin changed to more potent Crestor.   7. CKD: Stage 3.  BMET today.  8. CAD: Severe 3 vessel disease on 8/20 cath.  No chest pain.  - Ideally, would have CABG with MV replacement.  - Continue statin as above.  - She is on Eliquis so not getting aspirin. 9. She will need surgery for left femoral neck fracture, planned for this afternoon.  She is at moderate to high risk due to severe unrevascularized CAD and severe mitral regurgitation with volume overload.  I think that the surgery is necessary for her, she will need to be mobile to eventually get to cardiac surgery (needs CABG-MVR).   - Continue beta blocker peri-op.  - She will need to be followed closely by cardiology  with close attention paid to volume status. Avoid excess fluid with surgery.   Length of Stay: 2  Loralie Champagne, MD  08/08/2019, 9:34 AM  Advanced Heart Failure Team Pager [phone] (M-F; 7a - 4p)  Please contact [facility] for night-coverage after hours (4p -7a ) and weekends on [email or web address]

## 2019-08-08 NOTE — Anesthesia Procedure Notes (Signed)
Procedure Name: Intubation Date/Time: 08/08/2019 7:11 PM Performed by: Suzy Bouchard, CRNA Pre-anesthesia Checklist: Patient identified, Emergency Drugs available, Patient being monitored, Timeout performed and Suction available Patient Re-evaluated:Patient Re-evaluated prior to induction Oxygen Delivery Method: Circle system utilized Preoxygenation: Pre-oxygenation with 100% oxygen Induction Type: IV induction and Rapid sequence Laryngoscope Size: Miller and 2 Grade View: Grade I Tube type: Oral Tube size: 7.0 mm Number of attempts: 1 Airway Equipment and Method: Stylet Placement Confirmation: ETT inserted through vocal cords under direct vision,  positive ETCO2 and breath sounds checked- equal and bilateral Secured at: 22 cm Tube secured with: Tape Dental Injury: Teeth and Oropharynx as per pre-operative assessment

## 2019-08-08 NOTE — Interval H&P Note (Signed)
History and Physical Interval Note:  08/08/2019 6:30 PM  Caroline [name]  has presented today for surgery, with the diagnosis of Fractured Left Femur.  The various methods of treatment have been discussed with the patient and family. After consideration of risks, benefits and other options for treatment, the patient has consented to  Procedure(s): TOTAL HIP ARTHROPLASTY ANTERIOR APPROACH (Left) as a surgical intervention.  The patient's history has been reviewed, patient examined, no change in status, stable for surgery.  I have reviewed the patient's chart and labs.  Questions were answered to the patient's satisfaction.    The risks, benefits, and alternatives were discussed with the patient. There are risks associated with the surgery including, but not limited to, problems with anesthesia (death), infection, instability (giving out of the joint), dislocation, differences in leg length/angulation/rotation, fracture of bones, loosening or failure of implants, hematoma (blood accumulation) which may require surgical drainage, blood clots, pulmonary embolism, nerve injury (foot drop and lateral thigh numbness), and blood vessel injury. The patient understands these risks and elects to proceed.    Hilton Cork Shanira Tine

## 2019-08-08 NOTE — Progress Notes (Signed)
D/c'd A-line per Dr. Gifford Shave.

## 2019-08-08 NOTE — Anesthesia Postprocedure Evaluation (Signed)
Anesthesia Post Note  Patient: ELOWYNN NAVARRETTE  Procedure(s) Performed: TOTAL HIP ARTHROPLASTY ANTERIOR APPROACH (Left Hip)     Patient location during evaluation: PACU Anesthesia Type: General Level of consciousness: awake and alert Pain management: pain level controlled Vital Signs Assessment: post-procedure vital signs reviewed and stable Respiratory status: spontaneous breathing, nonlabored ventilation, respiratory function stable and patient connected to nasal cannula oxygen Cardiovascular status: blood pressure returned to baseline and stable Postop Assessment: no apparent nausea or vomiting Anesthetic complications: no    Last Vitals:  Vitals:   08/08/19 2224 08/08/19 2230  BP: 126/86   Pulse: 63 62  Resp: 12 13  Temp: 36.5 C   SpO2: 96% 100%    Last Pain:  Vitals:   08/08/19 2206  TempSrc:   PainSc: 3                  Catalina Gravel

## 2019-08-08 NOTE — Anesthesia Preprocedure Evaluation (Signed)
Anesthesia Evaluation  Patient identified by MRN, date of birth, ID band Patient awake    Reviewed: Allergy & Precautions, NPO status , Patient's Chart, lab work & pertinent test results, reviewed documented beta blocker date and time   Airway Mallampati: II  TM Distance: >3 FB Neck ROM: Full    Dental  (+) Teeth Intact, Dental Advisory Given   Pulmonary neg pulmonary ROS,    Pulmonary exam normal breath sounds clear to auscultation       Cardiovascular hypertension, Pt. on home beta blockers and Pt. on medications + CAD and +CHF  + dysrhythmias Atrial Fibrillation + Valvular Problems/Murmurs (TR) MR  Rhythm:Regular Rate:Normal + Diastolic murmurs TEE in 99991111, showing EF 55-60% with moderate TR and rheumatic-appearing mitral valve with severe mitral regurgitation and mild mitral stenosis.   Neuro/Psych TIAnegative psych ROS   GI/Hepatic negative GI ROS, Neg liver ROS,   Endo/Other  Hypothyroidism Obesity   Renal/GU Renal InsufficiencyRenal disease     Musculoskeletal  (+) Arthritis , Fractured Left Femur   Abdominal   Peds  Hematology  (+) Blood dyscrasia (Eliquis), , Plt 189k   Anesthesia Other Findings Day of surgery medications reviewed with the patient.  Reproductive/Obstetrics                             Anesthesia Physical Anesthesia Plan  ASA: IV  Anesthesia Plan: General   Post-op Pain Management:    Induction: Intravenous  PONV Risk Score and Plan: 3 and Dexamethasone and Ondansetron  Airway Management Planned: Oral ETT  Additional Equipment:   Intra-op Plan:   Post-operative Plan: Extubation in OR  Informed Consent: I have reviewed the patients History and Physical, chart, labs and discussed the procedure including the risks, benefits and alternatives for the proposed anesthesia with the patient or authorized representative who has indicated his/her understanding and  acceptance.     Dental advisory given  Plan Discussed with: CRNA  Anesthesia Plan Comments:         Anesthesia Quick Evaluation

## 2019-08-08 NOTE — Progress Notes (Addendum)
PROGRESS NOTE   Caroline [name]  [id number]    DOB: 16-Dec-1941    DOA: 08/06/2019  PCP: Remi Haggard, FNP   I have briefly reviewed patients [facility] medical [facility].  Chief Complaint  Patient presents with   Hip Pain   Fall    Brief Narrative:  78 year old female, PMH of chronic diastolic CHF, paroxysmal A. fib on Eliquis, CVA, HTN, HLD, hypothyroid, CKD 3, recent RHC/LHC 8/20 when diagnosed with severe mixed pulmonary venous/artery artery hypertension, low cardiac output and severe three-vessel coronary artery disease, seen [facility] for MVR/CABG, was at OP cardiologist office for follow-up on day of admission when she tripped while getting out of her wheelchair, fell on her left hip and admitted to the hospital for left femoral neck fracture.  Cardiology and orthopedics consulted.  Plan for left hip surgery this afternoon, high risk.   Assessment & Plan:   Principal Problem:   Fracture of femoral neck, left, closed ([place]) Active Problems:   HTN (hypertension)   Paroxysmal atrial fibrillation (HCC)   Hypothyroidism, unspecified   Chronic diastolic CHF (congestive heart failure) (HCC)   Pulmonary hypertension (HCC)   Mitral regurgitation and mitral stenosis   CKD (chronic kidney disease) stage 3, GFR 30-59 ml/min (HCC)   History of CVA (cerebrovascular accident)   Left femoral neck fracture, left pubic rami fracture and left sacral ala fracture  Sustained status post mechanical fall  Suspect underlying osteoporosis contributing.  Thereby patient will need to undergo bone density study within the next 4 weeks or start a bisphosphonate.  Orthopedic/Dr. Lyla Glassing input 8/11 appreciated and plans surgery 8/13.  Eliquis on hold and last dose was on 8/11 morning.  Cardiology seen for preop cardiac clearance and management of complex cardiac issues.  I discussed with Dr. Aundra Dubin on 8/12.  Patient is at moderate to high risk due to severe unrevascularized  CAD, severe MR, pulmonary hypertension, decompensated CHF and deconditioning.  She is being optimized (diuresed) for needed surgery so that she can eventually be mobile for MVR/CABG.  Continue perioperative beta-blockers and close perioperative monitoring and maintain even in and out volume status.  Nonoperative management for pubic rami and sacral fractures.  Plan for surgery this afternoon.  Cardiology follow-up appreciated.  Agree avoid excess fluid with surgery  Acute on chronic diastolic CHF  TTE [id number]: LVEF 60-65%, mild to moderate mitral stenosis.  TEE 8/20: Normal EF, rheumatic appearing mitral valve and severe MR/mild MS  RHC 8/20: Findings consistent with mixed pulmonary venous/pulmonary artery hypertension  Cardiology input much appreciated.  Myeloma panel was negative.  Eventually will need cardiac amyloidosis and PYP scan deferred for now.  IV Lasix increased to 80 mg twice daily, continue.  Continue Aldactone 12.5 mg daily.  Patient states that she diuresed well although intake and output is inaccurate and does not reflect that.  Cardiology giving her a dose of metolazone 2.5 today.  Acute respiratory failure with hypoxia  As per patient and RN, O2 sats at night reportedly dropped in the 80s and placed on oxygen.  Oxygen support as needed.  Wean oxygen as tolerated.  Incentive spirometry.  Likely related to CHF exacerbation.  Severe three-vessel CAD  Diagnosed by cardiac cath 8/20.  As indicated above, eventual plans for mitral valve replacement and CABG.  Continue metoprolol and rosuvastatin.  Eliquis on hold for surgery.  Mixed pulmonary venous/pulmonary artery hypertension  Noted on recent [place] 8/20  Cardiology considering sildenafil use after diuresis.  Mitral valve  disease  TEE 8/20 showed rheumatic appearing valve with severe mitral valve regurgitation and mild mitral stenosis.  Not a candidate for mitral clip or mitral valve repair based on valve  morphology.  She has seen Dr. Roxy Manns with [facility] and awaiting MVR/CABG.  Paroxysmal atrial fibrillation  Remains in sinus rhythm.  Continue metoprolol  Eliquis held for upcoming surgery.  Last dose of Eliquis was 8/11 morning.  Resume Eliquis post surgery when okay with orthopedics.  Hyperlipidemia  Continue Crestor  Acute kidney injury complicating stage III CKD  Recent Baseline creatinine may be in the 1.3-1.4 range.  Presented with creatinine of 1.62, may be related to lisinopril, diuretic use and cardiogenic.  Lisinopril held.  Ongoing IV Lasix.  Creatinine up again slightly from 1.59-1.69, acceptable in the context of ongoing volume overload and IV diuresis.  Continue to monitor BMP closely..  Essential hypertension  Mildly uncontrolled at times.  Continue metoprolol 50 mg twice daily.  Holding home lisinopril due to acute kidney injury and ongoing aggressive IV diuresis.  Hypothyroid  Continue Synthroid 112 mcg daily.  History of CVA, type not known  Continue rosuvastatin.  Not on aspirin due to being on Eliquis.  Obesity/Body mass index is 30.98 kg/m.  Nausea and Vomiting - one episode this am. Improved after Zofran. Added PO PPI for GI prophylaxis during current stressful situation.  No recurrence reported.  DVT prophylaxis: SCDs Code Status: Full Family Communication: I discussed in detail with patient's spouse via phone on 8/12, updated care and answered questions.  None at bedside today. Disposition: To be determined pending upcoming left hip surgery and clinical improvement   Consultants:  Cardiology Orthopedics  Procedures:  None  Antimicrobials:  None   Subjective: Patient states that she urinated well yesterday.  Denies dyspnea or chest pain.  Appropriate left hip pain controlled with medications.  Indicates that her surgeries this afternoon at 5 PM.  No nausea or vomiting reported.  Objective:  Vitals:   08/08/19 0130 08/08/19 0230 08/08/19  0330 08/08/19 0429  BP: 127/63 109/88 (!) 107/58 128/88  Pulse: 75 67 65 68  Resp:      Temp:    97.8 F (36.6 C)  TempSrc:    Oral  SpO2: 95% 96% 98% 97%  Weight:    76.8 kg  Height:        Examination:  General exam: Pleasant elderly female, moderately built and obese lying comfortably propped up in bed without distress. Respiratory system: Clear to auscultation.  No increased work of breathing. Cardiovascular system: S1 & S2 heard, RRR.  No JVD.  3/6 pan systolic murmur best heard at apex.  Trace bilateral leg edema.  Telemetry personally reviewed: Sinus rhythm. Gastrointestinal system: Abdomen is nondistended, soft and nontender. No organomegaly or masses felt. Normal bowel sounds heard. Central nervous system: Alert and oriented. No focal neurological deficits. Extremities: Symmetric 5 x 5 power.  Left lower extremity power unable to assess secondary to pain from fracture.  Left lower extremity shortened and externally rotated.  Some old bruising of left calf region from fall sustained 4 weeks ago.  She has approximately 1 cm size right shin healing ulcer with scab and no acute findings. Skin: No rashes, lesions or ulcers Psychiatry: Judgement and insight appear normal. Mood & affect appropriate.     Data Reviewed: I have personally reviewed following labs and imaging studies  CBC: Recent Labs  Lab 08/02/19 1259 08/02/19 1344 08/06/19 1807 08/07/19 0525 08/08/19 0637  WBC  --   --  16.1* 11.4* 12.1*  NEUTROABS  --   --  12.2*  --   --   HGB 15.0 14.3   14.3 14.8 14.7 13.9  HCT 44.0 42.0   42.0 47.0* 45.0 43.4  MCV  --   --  100.9* 98.7 99.1  PLT  --   --  211 194 [id number]   Basic Metabolic Panel: Recent Labs  Lab 08/02/19 1259 08/02/19 1344 08/06/19 1516 08/06/19 1807 08/07/19 0525 08/08/19 0637  NA 141 145   145 142 141 141 138  K 4.8 3.0*   3.1* 3.4* 4.8 3.7 3.7  CL 107  --  105 103 105 103  CO2  --   --  23 25 24 25   GLUCOSE 96  --  122* 113* 113* 110*  BUN  47*  --  32* 34* 29* 30*  CREATININE 1.30*  --  1.62* 1.66* 1.59* 1.69*  CALCIUM  --   --  9.4 9.2 9.3 8.8*   Liver Function Tests: No results for input(s): AST, ALT, ALKPHOS, BILITOT, PROT, ALBUMIN in the last 168 hours.  Cardiac Enzymes: No results for input(s): CKTOTAL, CKMB, CKMBINDEX, TROPONINI in the last 168 hours.  CBG: No results for input(s): GLUCAP in the last 168 hours.  Recent Results (from the past 240 hour(s))  SARS CORONAVIRUS 2 Nasal Swab Aptima Multi Swab     Status: None   Collection Time: 07/30/19  1:41 PM   Specimen: Aptima Multi Swab; Nasal Swab  Result Value Ref Range Status   SARS Coronavirus 2 NEGATIVE NEGATIVE Final    Comment: (NOTE) SARS-CoV-2 target nucleic acids are NOT DETECTED. The SARS-CoV-2 RNA is generally detectable in upper and lower respiratory specimens during the acute phase of infection. Negative results do not preclude SARS-CoV-2 infection, do not rule out co-infections with other pathogens, and should not be used as the sole basis for treatment or other patient management decisions. Negative results must be combined with clinical observations, patient history, and epidemiological information. The expected result is Negative. Fact Sheet for Patients: SugarRoll.be Fact Sheet for Healthcare Providers: https://www.woods-mathews.com/ This test is not yet approved or cleared by the [place] FDA and  has been authorized for detection and/or diagnosis of SARS-CoV-2 by FDA under an Emergency Use Authorization (EUA). This EUA will remain  in effect (meaning this test can be used) for the duration of the COVID-19 declaration under Section 56 4(b)(1) of the Act, 21 U.S.C. section 360bbb-3(b)(1), unless the authorization is terminated or revoked sooner. Performed at [facility], [place] [address], [place], [place] [address]   SARS Coronavirus 2 [facility] order, Performed in [facility] hospital  lab)     Status: None   Collection Time: 08/07/19  8:12 AM  Result Value Ref Range Status   SARS Coronavirus 2 NEGATIVE NEGATIVE Final    Comment: (NOTE) If result is NEGATIVE SARS-CoV-2 target nucleic acids are NOT DETECTED. The SARS-CoV-2 RNA is generally detectable in upper and lower  respiratory specimens during the acute phase of infection. The lowest  concentration of SARS-CoV-2 viral copies this assay can detect is 250  copies / mL. A negative result does not preclude SARS-CoV-2 infection  and should not be used as the sole basis for treatment or other  patient management decisions.  A negative result may occur with  improper specimen collection / handling, submission of specimen other  than nasopharyngeal swab, presence of viral mutation(s) within the  areas targeted by this assay, and inadequate number  of viral copies  (<250 copies / mL). A negative result must be combined with clinical  observations, patient history, and epidemiological information. If result is POSITIVE SARS-CoV-2 target nucleic acids are DETECTED. The SARS-CoV-2 RNA is generally detectable in upper and lower  respiratory specimens dur ing the acute phase of infection.  Positive  results are indicative of active infection with SARS-CoV-2.  Clinical  correlation with patient history and other diagnostic information is  necessary to determine patient infection status.  Positive results do  not rule out bacterial infection or co-infection with other viruses. If result is PRESUMPTIVE POSTIVE SARS-CoV-2 nucleic acids MAY BE PRESENT.   A presumptive positive result was obtained on the submitted specimen  and confirmed on repeat testing.  While 2019 novel coronavirus  (SARS-CoV-2) nucleic acids may be present in the submitted sample  additional confirmatory testing may be necessary for epidemiological  and / or clinical management purposes  to differentiate between  SARS-CoV-2 and other Sarbecovirus currently  known to infect humans.  If clinically indicated additional testing with an alternate test  methodology [id number]) is advised. The SARS-CoV-2 RNA is generally  detectable in upper and lower respiratory sp ecimens during the acute  phase of infection. The expected result is Negative. Fact Sheet for Patients:  StrictlyIdeas.no Fact Sheet for Healthcare Providers: BankingDealers.co.za This test is not yet approved or cleared by the [place] FDA and has been authorized for detection and/or diagnosis of SARS-CoV-2 by FDA under an Emergency Use Authorization (EUA).  This EUA will remain in effect (meaning this test can be used) for the duration of the COVID-19 declaration under Section 564(b)(1) of the Act, 21 U.S.C. section 360bbb-3(b)(1), unless the authorization is terminated or revoked sooner. Performed at [facility], [place] [address] [place], [place] [address]   Surgical pcr screen     Status: None   Collection Time: 08/07/19  8:55 PM   Specimen: Nasal Mucosa; Nasal Swab  Result Value Ref Range Status   MRSA, PCR NEGATIVE NEGATIVE Final   Staphylococcus aureus NEGATIVE NEGATIVE Final    Comment: (NOTE) The Xpert SA Assay (FDA approved for NASAL specimens in patients 40 years of age and older), is one component of a comprehensive surveillance program. It is not intended to diagnose infection nor to guide or monitor treatment. Performed at [facility], [place] [address] [place], [place] [address]          Radiology Studies: Dg Chest 1 View  Result Date: 08/06/2019 CLINICAL DATA:  Fall, left hip pain. History hypertension, CAD, CHF, mitral valve disease EXAM: CHEST  1 VIEW COMPARISON:  Most recent radiograph 12/20/2018, CT chest 12/11/2017 FINDINGS: Cardiomegaly with a calcified tortuous aorta similar to prior studies. No consolidation, features of edema, pneumothorax, or effusion. Pulmonary vascularity is normally  distributed. No acute osseous or soft tissue abnormality. Degenerative changes are present in the imaged spine and shoulders. Vascular calcium at the carotid bifurcation on the left. IMPRESSION: No acute cardiopulmonary abnormality or acute traumatic finding in the chest Electronically Signed   By: Lovena Le M.D.   On: 08/06/2019 16:44   Ct Head Wo Contrast  Result Date: 08/06/2019 CLINICAL DATA:  Recent fall with headaches, initial encounter EXAM: CT HEAD WITHOUT CONTRAST TECHNIQUE: Contiguous axial images were obtained from the base of the skull through the vertex without intravenous contrast. COMPARISON:  06/06/2017 FINDINGS: Brain: Mild chronic white matter ischemic changes noted. No findings to suggest acute hemorrhage, acute infarction or space-occupying mass  lesion. Vascular: No hyperdense vessel or unexpected calcification. Skull: Normal. Negative for fracture or focal lesion. Sinuses/Orbits: No acute finding. Other: Mild scalp hematoma in the left frontal region consistent with the recent injury. IMPRESSION: Scalp hematoma without acute intracranial abnormality. Electronically Signed   By: Inez Catalina M.D.   On: 08/06/2019 17:41   Ct Pelvis Wo Contrast  Result Date: 08/06/2019 CLINICAL DATA:  Fall.  Evaluate for fracture. EXAM: CT PELVIS WITHOUT CONTRAST TECHNIQUE: Multidetector CT imaging of the pelvis was performed following the standard protocol without intravenous contrast. COMPARISON:  Left hip x-rays from same day. FINDINGS: Urinary Tract:  No abnormality visualized. Bowel:  Sigmoid diverticulosis. Vascular/Lymphatic: Aortoiliac atherosclerosis. No pathologically enlarged lymph nodes. Reproductive:  Prior hysterectomy.  No adnexal mass. Other:  None. Musculoskeletal: Acute fracture of the left femoral neck with foreshortening and apex anterior angulation. Subacute, healing fractures of the left superior and inferior pubic rami with surrounding callus formation. Linear sclerosis in the left  sacral ala. No dislocation. IMPRESSION: 1. Acute left femoral neck fracture with foreshortening. 2. Subacute fractures of the left superior and inferior pubic rami as well as the left sacral ala. Electronically Signed   By: Titus Dubin M.D.   On: 08/06/2019 20:34   Dg Knee Left Port  Result Date: 08/07/2019 CLINICAL DATA:  Preop left femoral neck fracture, prior left knee replacement EXAM: PORTABLE LEFT KNEE - 1-2 VIEW COMPARISON:  None. FINDINGS: Left knee arthroplasty, without evidence of complication. No fracture or dislocation is seen. Visualized soft tissues are within normal limits. No suprapatellar knee joint effusion. IMPRESSION: Left knee arthroplasty, without evidence of complication. No fracture or dislocation is seen. Electronically Signed   By: Julian Hy M.D.   On: 08/07/2019 19:11   Dg Hip Unilat With Pelvis 2-3 Views Left  Result Date: 08/06/2019 CLINICAL DATA:  Fall, unable to move left leg EXAM: DG HIP (WITH OR WITHOUT PELVIS) 2-3V LEFT COMPARISON:  None. FINDINGS: Foreshortened transcervical left femoral neck fracture. Additional fractures of left superior and inferior pubic rami as well as disruption of the left sacral arcuate lines suggesting a left sacral fracture as well with hazy periosteal reaction suggesting possible callus formation. No other fracture or traumatic malalignment is seen. The lower sacrum is poorly visualized due to overlying bowel gas the SI joints and symphysis pubis remain congruent. Degenerative changes are present in both hips and within the lower lumbar spine. Vascular calcium is seen in the pelvis. IMPRESSION: 1. Foreshortened transcervical left femoral neck fracture. 2. Left superior and inferior pubic rami fractures and probable left sacral fracture. More age indeterminate given suggestion of callus formation. 3. Diffuse bone demineralization. Electronically Signed   By: Lovena Le M.D.   On: 08/06/2019 16:42        Scheduled Meds:   chlorhexidine  60 mL Topical Once   feeding supplement  296 mL Oral Once   furosemide  80 mg Intravenous BID   levothyroxine  112 mcg Oral QAC breakfast   metolazone  2.5 mg Oral Once   metoprolol tartrate  50 mg Oral BID   pantoprazole  40 mg Oral Daily   potassium chloride  30 mEq Oral Once   potassium chloride  20 mEq Oral Daily   povidone-iodine  2 application Topical Once   rosuvastatin  20 mg Oral Daily   spironolactone  12.5 mg Oral Daily   Continuous Infusions:   ceFAZolin (ANCEF) IV     tranexamic acid       LOS:  2 days     Vernell Leep, MD, FACP, [facility]. [facility]  To contact the attending provider between 7A-7P or the covering provider during after hours 7P-7A, please log into the web site [email or web address] and access using universal [place] password for that web site. If you do not have the password, please call the hospital operator.  08/08/2019, 10:23 AM

## 2019-08-08 NOTE — Op Note (Signed)
OPERATIVE REPORT  SURGEON: Rod Can, MD   ASSISTANT: Laure Kidney, RNFA.  PREOPERATIVE DIAGNOSIS: Displaced Left femoral neck fracture.  Subacute left LC 1 pelvis fracture  POSTOPERATIVE DIAGNOSIS: Displaced Left femoral neck fracture.  Subacute left LC 1 pelvis fracture.  PROCEDURE: Left total hip arthroplasty, anterior approach.  Closed management of pelvic ring fracture.  IMPLANTS: DePuy Tri Lock stem, size 4, hi offset. DePuy Pinnacle Cup, size 50 mm. DePuy Altrx liner, size 32 by 50 mm, +4 neutral. DePuy metal head ball, size 32 + 5 mm. 6.5 mm cancellous bone screw x1.  ANESTHESIA:  General  ANTIBIOTICS: 2g ancef.  ESTIMATED BLOOD LOSS:-300 mL    DRAINS: None.  COMPLICATIONS: None   CONDITION: PACU - hemodynamically stable.   BRIEF CLINICAL NOTE: Caroline [name] is a 78 y.o. female with an acute, displaced Left femoral neck fracture.  She also fell about 4 weeks ago and experienced left pelvic pain and difficulty weightbearing.  Work-up in the emergency department revealed a displaced left femoral neck fracture and a subacute left LC 1 pelvis fracture.  The patient was admitted to the hospitalist service and underwent perioperative risk stratification and medical optimization. The risks, benefits, and alternatives to total hip arthroplasty were explained, and the patient elected to proceed.  The pelvic fracture is acceptable for conservative nonoperative management.  PROCEDURE IN DETAIL: The patient was taken to the operating room and general anesthesia was induced on the hospital bed.  The patient was then positioned on the Hana table.  All bony prominences were well padded.  The hip was prepped and draped in the normal sterile surgical fashion.  A time-out was called verifying side and site of surgery. Antibiotics were given within 60 minutes of beginning the procedure.  The direct anterior approach to the hip was performed through the Hueter interval.   Lateral femoral circumflex vessels were treated with the Auqumantys. The anterior capsule was exposed and an inverted T capsulotomy was made.  Fracture hematoma was encountered and evacuated. The patient was found to have a comminuted Left subcapital femoral neck fracture.  I freshened the femoral neck cut with a saw.  I removed the femoral neck fragment.  A corkscrew was placed into the head and the head was removed.  This was passed to the back table and was measured.   Acetabular exposure was achieved, and the pulvinar and labrum were excised. Sequential reaming of the acetabulum was then performed up to a size 49 mm reamer. A 50 mm cup was then opened and impacted into place at approximately 40 degrees of abduction and 20 degrees of anteversion. I elected to augment the already acceptable fixation with a single 6.5 mm cancellous bone screw. The final polyethylene liner was impacted into place.    I then gained femoral exposure taking care to protect the abductors and greater trochanter.  This was performed using standard external rotation, extension, and adduction.  The capsule was peeled off the inner aspect of the greater trochanter, taking care to preserve the short external rotators. A cookie cutter was used to enter the femoral canal, and then the femoral canal finder was used to confirm location.  I then sequentially broached up to a size 4.  Calcar planer was used on the femoral neck remnant.  I paced a hi neck and a trial head ball. The hip was reduced.  Leg lengths were checked fluoroscopically.  The hip was dislocated and trial components were removed.  I placed the real stem  followed by the real spacer and head ball.  The hip was reduced.  Fluoroscopy was used to confirm component position and leg lengths.  At 90 degrees of external rotation and extension, the hip was stable to an anterior directed force.   The wound was copiously irrigated with Irrisept solution and normal saline using pule  lavage.  Marcaine solution was injected into the periarticular soft tissue.  The wound was closed in layers using #1 Vicryl and V-Loc for the fascia, 2-0 Vicryl for the subcutaneous fat, 2-0 Monocryl for the deep dermal layer, 3-0 running Monocryl subcuticular stitch and glue for the skin.  Once the glue was fully dried, an Aquacell Ag dressing was applied.  The patient was then awakened from anesthesia and transported to the recovery room in stable condition.  Sponge, needle, and instrument counts were correct at the end of the case x2.  The patient tolerated the procedure well and there were no known complications.  Please note that a surgical assistant was a medical necessity for this procedure to perform it in a safe and expeditious manner. Assistant was necessary to provide appropriate retraction of vital neurovascular structures, to prevent femoral fracture, and to allow for anatomic placement of the prosthesis.  POSTOPERATIVE PLAN: The patient will be readmitted to the hospitalist service.  Weightbearing as tolerated left lower extremity with walker.  Mobilize out of bed with physical therapy. Begin Eliquis tomorrow at 2.5 mg twice daily.  After 48 to 72 hours, the normal full dose can be resumed.  Disposition planning.

## 2019-08-08 NOTE — Transfer of Care (Signed)
Immediate Anesthesia Transfer of Care Note  Patient: Caroline [name]  Procedure(s) Performed: TOTAL HIP ARTHROPLASTY ANTERIOR APPROACH (Left Hip)  Patient Location: PACU  Anesthesia Type:General  Level of Consciousness: awake  Airway & Oxygen Therapy: Patient Spontanous Breathing and Patient connected to nasal cannula oxygen  Post-op Assessment: Report given to RN and Post -op Vital signs reviewed and stable  Post vital signs: Reviewed and stable  Last Vitals:  Vitals Value Taken Time  BP 145/105 08/08/19 2136  Temp    Pulse    Resp 16 08/08/19 2138  SpO2    Vitals shown include unvalidated device data.  Last Pain:  Vitals:   08/08/19 0951  TempSrc:   PainSc: 3       Patients Stated Pain Goal: 0 ([id number])  Complications: No apparent anesthesia complications

## 2019-08-09 ENCOUNTER — Encounter (HOSPITAL_COMMUNITY): Payer: Self-pay | Admitting: Orthopedic Surgery

## 2019-08-09 DIAGNOSIS — Z96642 Presence of left artificial hip joint: Secondary | ICD-10-CM

## 2019-08-09 DIAGNOSIS — I10 Essential (primary) hypertension: Secondary | ICD-10-CM

## 2019-08-09 LAB — BASIC METABOLIC PANEL
Anion gap: 11 (ref 5–15)
BUN: 35 mg/dL — ABNORMAL HIGH (ref 8–23)
CO2: 25 mmol/L (ref 22–32)
Calcium: 8.7 mg/dL — ABNORMAL LOW (ref 8.9–10.3)
Chloride: 102 mmol/L (ref 98–111)
Creatinine, Ser: 1.96 mg/dL — ABNORMAL HIGH (ref 0.44–1.00)
GFR calc Af Amer: 28 mL/min — ABNORMAL LOW (ref >60)
GFR calc non Af Amer: 24 mL/min — ABNORMAL LOW (ref >60)
Glucose, Bld: 142 mg/dL — ABNORMAL HIGH (ref 70–99)
Potassium: 4.3 mmol/L (ref 3.5–5.1)
Sodium: 138 mmol/L (ref 135–145)

## 2019-08-09 LAB — CBC
HCT: 37.7 % (ref 36.0–46.0)
Hemoglobin: 12.2 g/dL (ref 12.0–15.0)
MCH: 31.9 pg (ref 26.0–34.0)
MCHC: 32.4 g/dL (ref 30.0–36.0)
MCV: 98.7 fL (ref 80.0–100.0)
Platelets: 171 10*3/uL (ref 150–400)
RBC: 3.82 MIL/uL — ABNORMAL LOW (ref 3.87–5.11)
RDW: 16 % — ABNORMAL HIGH (ref 11.5–15.5)
WBC: 16.2 10*3/uL — ABNORMAL HIGH (ref 4.0–10.5)
nRBC: 0.2 % (ref 0.0–0.2)

## 2019-08-09 MED ORDER — FUROSEMIDE 10 MG/ML IJ SOLN
80.0000 mg | Freq: Two times a day (BID) | INTRAMUSCULAR | Status: DC
  Administered 2019-08-09: 80 mg via INTRAVENOUS
  Filled 2019-08-09: qty 8

## 2019-08-09 NOTE — Progress Notes (Signed)
Patient ID: Caroline [name], female   DOB: 06-14-1941, 78 y.o.   MRN: [id number]     Advanced Heart Failure Rounding Note  PCP-Cardiologist: Ida Rogue, MD   Subjective:    Good diuresis yesterday with IV Lasix and metolazone.  Creatinine higher today at 1.96, post-op left THR yesterday evening.    Objective:   Weight Range: 71.6 kg Body mass index is 28.87 kg/m.   Vital Signs:   Temp:  [97.7 F (36.5 C)-98 F (36.7 C)] 98 F (36.7 C) (08/14 0553) Pulse Rate:  [62-69] 64 (08/14 0553) Resp:  [10-20] 13 (08/13 2230) BP: (106-145)/(47-108) 135/90 (08/14 0553) SpO2:  [95 %-100 %] 100 % (08/14 0553) Arterial Line BP: (141-180)/(75-167) 180/167 (08/13 2230) Weight:  [71.6 kg-76.8 kg] 71.6 kg (08/14 0553) Last BM Date: 08/06/19  Weight change: Filed Weights   08/08/19 0429 08/08/19 1731 08/09/19 0553  Weight: 76.8 kg 76.8 kg 71.6 kg    Intake/Output:   Intake/Output Summary (Last 24 hours) at 08/09/2019 0734 Last data filed at 08/09/2019 0555 Gross per 24 hour  Intake 420 ml  Output 2425 ml  Net -2005 ml      Physical Exam    General: NAD Neck: JVP 10 cm, no thyromegaly or thyroid nodule.  Lungs: Clear to auscultation bilaterally with normal respiratory effort. CV: Nondisplaced PMI.  Heart regular S1/S2, no S3/S4, 3/6 HSM apex.  Trace ankle edema.  Abdomen: Soft, nontender, no hepatosplenomegaly, no distention.  Skin: Intact without lesions or rashes.  Neurologic: Alert and oriented x 3.  Psych: Normal affect. Extremities: No clubbing or cyanosis.  HEENT: Normal.    Telemetry   NSR 60s, personally reviewed.   Labs    CBC Recent Labs    08/06/19 1807  08/08/19 0637 08/09/19 0356  WBC 16.1*   < > 12.1* 16.2*  NEUTROABS 12.2*  --   --   --   HGB 14.8   < > 13.9 12.2  HCT 47.0*   < > 43.4 37.7  MCV 100.9*   < > 99.1 98.7  PLT 211   < > 189 171   < > = values in this interval not displayed.   Basic Metabolic Panel Recent Labs    08/08/19 0637  08/09/19 0356  NA 138 138  K 3.7 4.3  CL 103 102  CO2 25 25  GLUCOSE 110* 142*  BUN 30* 35*  CREATININE 1.69* 1.96*  CALCIUM 8.8* 8.7*   Liver Function Tests No results for input(s): AST, ALT, ALKPHOS, BILITOT, PROT, ALBUMIN in the last 72 hours. No results for input(s): LIPASE, AMYLASE in the last 72 hours. Cardiac Enzymes No results for input(s): CKTOTAL, CKMB, CKMBINDEX, TROPONINI in the last 72 hours.  BNP: BNP (last 3 results) Recent Labs    07/30/19 1208  BNP 2,543.3*    ProBNP (last 3 results) No results for input(s): PROBNP in the last 8760 hours.   D-Dimer No results for input(s): DDIMER in the last 72 hours. Hemoglobin A1C No results for input(s): HGBA1C in the last 72 hours. Fasting Lipid Panel No results for input(s): CHOL, HDL, LDLCALC, TRIG, CHOLHDL, LDLDIRECT in the last 72 hours. Thyroid Function Tests No results for input(s): TSH, T4TOTAL, T3FREE, THYROIDAB in the last 72 hours.  Invalid input(s): FREET3  Other results:   Imaging    Pelvis Portable  Result Date: 08/08/2019 CLINICAL DATA:  Postop left hip EXAM: PORTABLE PELVIS 1-2 VIEWS COMPARISON:  08/08/2019, 08/06/2019 FINDINGS: Status post left hip replacement with  normal alignment. Gas in the soft tissues consistent with recent surgery. Subacute fractures left superior and inferior pubic rami. Vascular calcification IMPRESSION: 1. Left hip replacement with expected operative change 2. Subacute left superior and inferior pubic rami fractures Electronically Signed   By: Donavan Foil M.D.   On: 08/08/2019 22:51   Dg C-arm 1-60 Min  Result Date: 08/08/2019 CLINICAL DATA:  Hip fracture EXAM: DG C-ARM 61-120 MIN; OPERATIVE LEFT HIP WITH PELVIS COMPARISON:  08/06/2019 FINDINGS: Five low resolution intraoperative spot views of the left hip. Total fluoroscopy time was 16 seconds. The images demonstrate a left hip replacement with normal alignment. There are acute left superior and inferior pubic rami  fractures. IMPRESSION: 1. Status post left hip replacement with normal alignment 2. Fractures of the left inferior and superior pubic rami Electronically Signed   By: Donavan Foil M.D.   On: 08/08/2019 21:08   Dg Hip Operative Unilat W Or W/o Pelvis Left  Result Date: 08/08/2019 CLINICAL DATA:  Hip fracture EXAM: DG C-ARM 61-120 MIN; OPERATIVE LEFT HIP WITH PELVIS COMPARISON:  08/06/2019 FINDINGS: Five low resolution intraoperative spot views of the left hip. Total fluoroscopy time was 16 seconds. The images demonstrate a left hip replacement with normal alignment. There are acute left superior and inferior pubic rami fractures. IMPRESSION: 1. Status post left hip replacement with normal alignment 2. Fractures of the left inferior and superior pubic rami Electronically Signed   By: Donavan Foil M.D.   On: 08/08/2019 21:08     Medications:     Scheduled Medications: . apixaban  2.5 mg Oral BID  . docusate sodium  100 mg Oral BID  . fentaNYL      . furosemide  80 mg Intravenous BID  . levothyroxine  112 mcg Oral QAC breakfast  . metoprolol tartrate  50 mg Oral BID  . pantoprazole  40 mg Oral Daily  . potassium chloride  20 mEq Oral Daily  . rosuvastatin  20 mg Oral Daily  . spironolactone  12.5 mg Oral Daily    Infusions: .  ceFAZolin (ANCEF) IV 2 g (08/09/19 0132)    PRN Medications: acetaminophen, HYDROcodone-acetaminophen, HYDROcodone-acetaminophen, menthol-cetylpyridinium **OR** phenol, metoCLOPramide **OR** metoCLOPramide (REGLAN) injection, morphine injection, ondansetron **OR** ondansetron (ZOFRAN) IV, senna-docusate   Assessment/Plan   1. Acute on chronic diastolic CHF: Echo in [id number] showed EF 60-65%, mild LVH, normal-appearing RV with PASP 60 mmHg, mild-moderate mitral stenosis. 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  RHC in 8/20 showed elevated right and left heart filling pressures with mixed pulmonary venous/pulmonary  arterial hypertension. She has been diuresed with IV Lasix and got metolazone yesterday.  Still volume overloaded but improving, diuresed well yesterday.  Creatinine up to 1.95 today.   - No metolazone today and hold am Lasix, can give 1 dose of IV Lasix this pm.   - Continue spironolactone 12.5 daily.  - I think we can cancel PYP scan for now as I expect her CHF is related to mitral regurgitation and CAD.  2. Pulmonary hypertension: Mixed pulmonary venous/pulmonary arterial hypertension on 8/20 RHC.  Suspect PAH component may be due to pulmonary vascular changes in the setting of severe MR over time.  - Treatment for now will be diuresis.   - Once she is well-diuresed, will consider sildenafil use.  3. 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  She would not be a  candidate for Mitraclip or mitral valve repair based on the morphology of the valve.  She has seen Dr. Roxy Manns with [facility], would need CABG-MV replacement. This will be a fairly high risk procedure given active CHF, pulmonary hypertension, and deconditioning.  Prior to surgery, she would need better control of CHF as well as physical therapy.  4. Atrial fibrillation: Paroxysmal.  She is in NSR today.  - Eliquis restarted at 2.5 mg bid post-op, can increased to 5 mg bid in 48 hrs per surgeon.   - Continue metoprolol.  5. PAD: No claudication but legs fatigue easily. Recent ABIs were moderately decreased.  She will be seeing vascular surgery in [place].  6. Hyperlipidemia: Goal LDL < 70 with vascular disease, LDL was 134 when recently checked.  - Statin changed to more potent Crestor.   7. AKI on CKD stage 3.  Baseline creatinine around 1.5, up to 1.95 today (suspect diuresis + possible peri-operative hypotension yesterday). Still some volume overload on exam.  - Hold this morning's Lasix and no metolazone, restart IV Lasix in evening today.  8. CAD: Severe 3  vessel disease on 8/20 cath.  No chest pain.  - Ideally, would have CABG with MV replacement.  - Continue statin as above.  - She is on Eliquis so not getting aspirin. 9. S/p left THR after mechanical fall.  Post-op management per surgery, will need PT and improved mobility to get her to the point where she would be ready for her cardiac surgery.    Length of Stay: 3  Loralie Champagne, MD  08/09/2019, 7:34 AM  Advanced Heart Failure Team Pager [phone] (M-F; 7a - 4p)  Please contact [facility] for night-coverage after hours (4p -7a ) and weekends on [email or web address]

## 2019-08-09 NOTE — Evaluation (Signed)
Physical Therapy Evaluation Patient Details Name: Caroline [name] MRN: [id number] DOB: 1941-04-20 Today's Date: 08/09/2019   History of Present Illness  78 y.o. female admitted on 08/06/19 after falling at the cardiologist office sustaining a L hip fx and pelvic ring fracture (pelvis might be sub acute), s/p L direct anterior THA WBAT post op on 08/08/19.  Pt with significant PMH of tricuspid regurgitation, stroke, PAF, mitral regurgitation and mitral stenosis, HTN, CAD, CKD stage IV, chronic diastolic heart failure, L TKA, and planned heart surgery next month (which will likely be postponed).    Clinical Impression  Pt is POD #1 and is moving very well, min assist overall with in room gait with RW.  She was able to start her HEP program, and has family support at discharge.  I anticipate she will progress well enough to d/c home with [facility] therapy follow up.   PT to follow acutely for deficits listed below.      Follow Up Recommendations Home health PT;Supervision for mobility/OOB    Equipment Recommendations  None recommended by PT    Recommendations for Other Services   NA    Precautions / Restrictions Precautions Precautions: Fall Precaution Comments: No hip ROM precautions Restrictions Weight Bearing Restrictions: Yes LLE Weight Bearing: Weight bearing as tolerated      Mobility  Bed Mobility Overal bed mobility: Needs Assistance Bed Mobility: Supine to Sit     Supine to sit: Min assist;HOB elevated     General bed mobility comments: Min assist to help progress left leg over the side of the bed.  Cues for technique, HOB moderately elevated, pt using bed rail, warm up exercises preformed prior to getting up.   Transfers Overall transfer level: Needs assistance Equipment used: Rolling walker (2 wheeled) Transfers: Sit to/from Stand Sit to Stand: Min assist         General transfer comment: Min assit to help pt power up to stanidng from elevated bed.    Ambulation/Gait Ambulation/Gait assistance: Min assist Gait Distance (Feet): 25 Feet Assistive device: Rolling walker (2 wheeled) Gait Pattern/deviations: Step-to pattern;Antalgic     General Gait Details: Pt with moderately antalgic gait pattern, cues for safe RW use and LE sequencing during gait.  Pt pleasantly surprised at how good she feels on her feet.          Balance Overall balance assessment: Needs assistance Sitting-balance support: Feet supported;No upper extremity supported Sitting balance-Leahy Scale: Good     Standing balance support: Bilateral upper extremity supported Standing balance-Leahy Scale: Fair                               Pertinent Vitals/Pain Pain Assessment: 0-10 Pain Score: 5  Pain Location: left hip Pain Descriptors / Indicators: Grimacing;Guarding Pain Intervention(s): Limited activity within patient's tolerance;Monitored during session;Repositioned    Home Living Family/patient expects to be discharged to:: Private residence Living Arrangements: Spouse/significant other Available Help at Discharge: Family;Available 24 hours/day(son here from [place] as long as needed. ) Type of Home: House Home Access: Stairs to enter Entrance Stairs-Rails: Left Entrance Stairs-Number of Steps: 3 Home Layout: One level Home Equipment: Walker - 2 wheels;Bedside commode;Hospital bed(no rails on hospital bed, but head and feet go up/down)      Prior Function Level of Independence: Independent               Hand Dominance   Dominant Hand: Right    Extremity/Trunk Assessment  Upper Extremity Assessment Upper Extremity Assessment: Defer to OT evaluation    Lower Extremity Assessment Lower Extremity Assessment: LLE deficits/detail LLE Deficits / Details: left leg with normal post op pain and weakness, ankle 3/5, knee 3-/5, hip 2/5    Cervical / Trunk Assessment Cervical / Trunk Assessment: Normal  Communication    Communication: No difficulties  Cognition Arousal/Alertness: Awake/alert Behavior During Therapy: WFL for tasks assessed/performed Overall Cognitive Status: Within Functional Limits for tasks assessed                                           Exercises Total Joint Exercises Ankle Circles/Pumps: AROM;Both;10 reps Quad Sets: AROM;Left;10 reps Heel Slides: AAROM;Left;10 reps Long Arc Quad: AROM;Both;10 reps   Assessment/Plan    PT Assessment Patient needs continued PT services  PT Problem List Decreased strength;Decreased range of motion;Decreased activity tolerance;Decreased balance;Decreased mobility;Decreased knowledge of use of DME;Decreased knowledge of precautions;Pain       PT Treatment Interventions DME instruction;Gait training;Stair training;Therapeutic activities;Functional mobility training;Therapeutic exercise;Balance training;Patient/family education;Manual techniques;Modalities    PT Goals (Current goals can be found in the Care Plan section)  Acute Rehab PT Goals Patient Stated Goal: to get better so she can have her heart surgery PT Goal Formulation: With patient/family Time For Goal Achievement: 08/23/19 Potential to Achieve Goals: Good    Frequency Min 5X/week           AM-PAC PT "6 Clicks" Mobility  Outcome Measure Help needed turning from your back to your side while in a flat bed without using bedrails?: A Little Help needed moving from lying on your back to sitting on the side of a flat bed without using bedrails?: A Little Help needed moving to and from a bed to a chair (including a wheelchair)?: A Little Help needed standing up from a chair using your arms (e.g., wheelchair or bedside chair)?: A Little Help needed to walk in hospital room?: A Little Help needed climbing 3-5 steps with a railing? : A Little 6 Click Score: 18    End of Session Equipment Utilized During Treatment: Gait belt Activity Tolerance: Patient limited by  pain Patient left: in chair;with call bell/phone within reach;with family/visitor present Nurse Communication: Mobility status PT Visit Diagnosis: Muscle weakness (generalized) (M62.81);Difficulty in walking, not elsewhere classified (R26.2);Pain Pain - Right/Left: Left Pain - part of body: Hip    Time: [id number] PT Time Calculation (min) (ACUTE ONLY): 36 min   Charges:         Wells Guiles B. Keeven Matty, PT, DPT  Acute Rehabilitation [phone] pager [phone]) [phone] office  @ [facility]: [phone]   PT Evaluation $PT Eval Moderate Complexity: 1 Mod PT Treatments $Gait Training: 8-22 mins        08/09/2019, 6:39 PM

## 2019-08-09 NOTE — Care Management Important Message (Signed)
Important Message  Patient Details  Name: Caroline [name] MRN: [id number] Date of Birth: [birth year]   Medicare Important Message Given:  Yes     Shelda Altes 08/09/2019, 12:54 PM

## 2019-08-09 NOTE — Social Work (Signed)
CSW acknowledging consult for SNF placement. Will follow for therapy recommendations.   Trace Wirick, MSW, LCSWA [place] Clinical Social Work [phone]   

## 2019-08-09 NOTE — Social Work (Signed)
CSW will follow for therapy recommendations.   Clement Deneault, MSW, LCSWA [place] Clinical Social Work [phone]   

## 2019-08-09 NOTE — Progress Notes (Addendum)
PROGRESS NOTE   Caroline [name]  [id number]    DOB: Nov 21, 1941    DOA: 08/06/2019  PCP: Remi Haggard, FNP   I have briefly reviewed patients previous medical records in [facility].  Chief Complaint  Patient presents with  . Hip Pain  . Fall    Brief Narrative:  78 year old female, PMH of chronic diastolic CHF, paroxysmal A. fib on Eliquis, CVA, HTN, HLD, hypothyroid, CKD 3, recent RHC/LHC 8/20 when diagnosed with severe mixed pulmonary venous/artery artery hypertension, low cardiac output and severe three-vessel coronary artery disease, seen [facility] for MVR/CABG, was at OP cardiologist office for follow-up on day of admission when she tripped while getting out of her wheelchair, fell on her left hip and admitted to the hospital for left femoral neck fracture.  Cardiology and orthopedics consulted.  S/p left THA 8/13.  Course complicated by progressive acute on chronic kidney disease, likely related to aggressive diuresis.   Assessment & Plan:   Principal Problem:   Fracture of femoral neck, left, closed ([place]) Active Problems:   HTN (hypertension)   Paroxysmal atrial fibrillation (HCC)   Hypothyroidism, unspecified   Chronic diastolic CHF (congestive heart failure) (HCC)   Pulmonary hypertension (HCC)   Mitral regurgitation and mitral stenosis   CKD (chronic kidney disease) stage 3, GFR 30-59 ml/min (HCC)   History of CVA (cerebrovascular accident)   Left femoral neck fracture, left pubic rami fracture and left sacral ala fracture  Sustained status post mechanical fall  Suspect underlying osteoporosis contributing.  Thereby patient will need to undergo bone density study within the next 4 weeks or start a bisphosphonate.  Orthopedic/Dr. Lyla Glassing consulted and after preop cardiac optimization and clearance (moderate to high risk) underwent uneventful left THA 8/13.  Eliquis was on hold preop, resuming at lower dose 2.5 mg twice daily day 1 postop and to be increased  to 5 mg twice daily in 48 hours as per orthopedic recommendations.  Nonoperative management for pubic rami and sacral fractures.  As per orthopedics, WBAT on left lower extremity.  Acute on chronic diastolic CHF  TTE 123XX123: LVEF 60-65%, mild to moderate mitral stenosis.  TEE 8/20: Normal EF, rheumatic appearing mitral valve and severe MR/mild MS  RHC 8/20: Findings consistent with mixed pulmonary venous/pulmonary artery hypertension  Cardiology input much appreciated.  Myeloma panel was negative.  Eventually will need cardiac amyloidosis and PYP scan deferred for now.  Preop patient was diuresed with IV Lasix 80 mg twice daily, Aldactone 12.5 mg daily and received a dose of metolazone on day of surgery.  Still not sure if intake output charting is accurate.  -1 L documented thus far and weight is down by about 3 pounds.  Due to rising creatinine, held this morning's dose of Lasix and resuming Lasix this evening.  Volume status is improving but still remains overloaded.  Acute respiratory failure with hypoxia  Likely related to decompensated CHF.  Resolved and currently on room air.  Severe three-vessel CAD  Diagnosed by cardiac cath 8/20.  As indicated above, eventual plans for mitral valve replacement and CABG.  Continue metoprolol and rosuvastatin.  Eliquis resumed day 1 postop on 8/14.  Mixed pulmonary venous/pulmonary artery hypertension  Noted on recent [place] 8/20  Cardiology considering sildenafil use after diuresis.  Mitral valve disease  TEE 8/20 showed rheumatic appearing valve with severe mitral valve regurgitation and mild mitral stenosis.  Not a candidate for mitral clip or mitral valve repair based on valve morphology.  She  has seen Dr. Roxy Manns with [facility]CTS and awaiting MVR/CABG.  Paroxysmal atrial fibrillation  Remains in sinus rhythm.  Continue metoprolol  Eliquis held from morning of 8/11 through day of surgery 8/13.  Resuming at reduced dose 2.5 mg  twice daily day 1 postop on 8/14 to be increased to 5 mg twice daily in 48 hours postop as per orthopedic recommendations.  Hyperlipidemia  Continue Crestor  Acute kidney injury complicating stage III CKD  Recent Baseline creatinine may be in the 1.3-1.4 range.  Presented with creatinine of 1.62, may be related to lisinopril, diuretic use and cardiogenic.  Lisinopril held.    Creatinine has steadily increased from 1.59 > 1.69 > 1.95 today, likely related to IV/p.o. diuresis and perioperative hypotension.  Remains volume overloaded although improving.  Holding this morning's dose of Lasix, resuming evening dose of Lasix.  Follow BMP in a.m.  Essential hypertension  Reasonably controlled.  Continue metoprolol 50 mg twice daily.  Holding home lisinopril due to acute kidney injury and ongoing aggressive IV diuresis.  Hypothyroid  Continue Synthroid 112 mcg daily.  History of CVA, type not known  Continue rosuvastatin.  Not on aspirin due to being on Eliquis.  Obesity/Body mass index is 28.87 kg/m.  Nausea and Vomiting -Resolved.  Continue PPI and PRN Zofran.  DVT prophylaxis: SCDs Code Status: Full Family Communication: I discussed with spouse via phone with spouse and son separately, updated care and answered questions. Disposition: To be determined pending clinical improvement and therapy evaluation.  Will likely need SNF.  Social work consulted.   Consultants:  Cardiology Orthopedics  Procedures:  Left total hip arthroplasty, anterior approach on 8/13.  Closed management of pelvic ring fracture.  Antimicrobials:  None   Subjective: Patient denies dyspnea or chest pain.  No pain in operated left hip.  As per RN, no acute issues reported.  No further nausea or vomiting reported.  Objective:  Vitals:   08/08/19 2321 08/09/19 0553 08/09/19 0810 08/09/19 0858  BP: 110/68 135/90 136/87 (!) 128/101  Pulse: 65 64 63 68  Resp:   14   Temp: 97.8 F (36.6 C) 98 F  (36.7 C)    TempSrc: Oral Oral    SpO2: 99% 100% 97%   Weight:  71.6 kg    Height:        Examination:  General exam: Pleasant elderly female, moderately built and obese lying comfortably propped up in bed without distress. Respiratory system: Clear to auscultation.  No increased work of breathing.   Cardiovascular system: S1 and S2 heard, RRR.  No JVD..  3/6 pan systolic murmur best heard at apex.  Trace bilateral ankle edema.  Telemetry personally reviewed: Sinus rhythm. Gastrointestinal system: Abdomen is nondistended, soft and nontender. No organomegaly or masses felt. Normal bowel sounds heard. Central nervous system: Alert and oriented. No focal neurological deficits. Extremities: Symmetric 5 x 5 power.  Left lower extremity power unable to assess secondary to postop pain but at least grade 4 x 5 power.  Left hip postop dressing clean and dry.  Some old bruising of left calf region from fall sustained 4 weeks ago without worsening.  She has approximately 1 cm size right shin healing ulcer with scab and no acute findings. Skin: No rashes, lesions or ulcers Psychiatry: Judgement and insight appear normal. Mood & affect appropriate.     Data Reviewed: I have personally reviewed following labs and imaging studies  CBC: Recent Labs  Lab 08/02/19 1344 08/06/19 1807 08/07/19 0525 08/08/19 [id number]  08/09/19 0356  WBC  --  16.1* 11.4* 12.1* 16.2*  NEUTROABS  --  12.2*  --   --   --   HGB 14.3  14.3 14.8 14.7 13.9 12.2  HCT 42.0  42.0 47.0* 45.0 43.4 37.7  MCV  --  100.9* 98.7 99.1 98.7  PLT  --  211 194 189 [id number]   Basic Metabolic Panel: Recent Labs  Lab 08/06/19 1516 08/06/19 1807 08/07/19 0525 08/08/19 0637 08/09/19 0356  NA 142 141 141 138 138  K 3.4* 4.8 3.7 3.7 4.3  CL 105 103 105 103 102  CO2 23 25 24 25 25   GLUCOSE 122* 113* 113* 110* 142*  BUN 32* 34* 29* 30* 35*  CREATININE 1.62* 1.66* 1.59* 1.69* 1.96*  CALCIUM 9.4 9.2 9.3 8.8* 8.7*   Liver Function Tests:  No results for input(s): AST, ALT, ALKPHOS, BILITOT, PROT, ALBUMIN in the last 168 hours.  Cardiac Enzymes: No results for input(s): CKTOTAL, CKMB, CKMBINDEX, TROPONINI in the last 168 hours.  CBG: No results for input(s): GLUCAP in the last 168 hours.  Recent Results (from the past 240 hour(s))  SARS CORONAVIRUS 2 Nasal Swab Aptima Multi Swab     Status: None   Collection Time: 07/30/19  1:41 PM   Specimen: Aptima Multi Swab; Nasal Swab  Result Value Ref Range Status   SARS Coronavirus 2 NEGATIVE NEGATIVE Final    Comment: (NOTE) SARS-CoV-2 target nucleic acids are NOT DETECTED. The SARS-CoV-2 RNA is generally detectable in upper and lower respiratory specimens during the acute phase of infection. Negative results do not preclude SARS-CoV-2 infection, do not rule out co-infections with other pathogens, and should not be used as the sole basis for treatment or other patient management decisions. Negative results must be combined with clinical observations, patient history, and epidemiological information. The expected result is Negative. Fact Sheet for Patients: SugarRoll.be Fact Sheet for Healthcare Providers: https://www.woods-mathews.com/ This test is not yet approved or cleared by the [place] FDA and  has been authorized for detection and/or diagnosis of SARS-CoV-2 by FDA under an Emergency Use Authorization (EUA). This EUA will remain  in effect (meaning this test can be used) for the duration of the COVID-19 declaration under Section 56 4(b)(1) of the Act, 21 U.S.C. section 360bbb-3(b)(1), unless the authorization is terminated or revoked sooner. Performed at [facility], [place] [address], [place], [place] [address]   SARS Coronavirus 2 [facility] order, Performed in [facility] hospital lab)     Status: None   Collection Time: 08/07/19  8:12 AM  Result Value Ref Range Status   SARS Coronavirus 2 NEGATIVE NEGATIVE  Final    Comment: (NOTE) If result is NEGATIVE SARS-CoV-2 target nucleic acids are NOT DETECTED. The SARS-CoV-2 RNA is generally detectable in upper and lower  respiratory specimens during the acute phase of infection. The lowest  concentration of SARS-CoV-2 viral copies this assay can detect is 250  copies / mL. A negative result does not preclude SARS-CoV-2 infection  and should not be used as the sole basis for treatment or other  patient management decisions.  A negative result may occur with  improper specimen collection / handling, submission of specimen other  than nasopharyngeal swab, presence of viral mutation(s) within the  areas targeted by this assay, and inadequate number of viral copies  (<250 copies / mL). A negative result must be combined with clinical  observations, patient history, and epidemiological information. If result is POSITIVE SARS-CoV-2 target nucleic  acids are DETECTED. The SARS-CoV-2 RNA is generally detectable in upper and lower  respiratory specimens dur ing the acute phase of infection.  Positive  results are indicative of active infection with SARS-CoV-2.  Clinical  correlation with patient history and other diagnostic information is  necessary to determine patient infection status.  Positive results do  not rule out bacterial infection or co-infection with other viruses. If result is PRESUMPTIVE POSTIVE SARS-CoV-2 nucleic acids MAY BE PRESENT.   A presumptive positive result was obtained on the submitted specimen  and confirmed on repeat testing.  While 2019 novel coronavirus  (SARS-CoV-2) nucleic acids may be present in the submitted sample  additional confirmatory testing may be necessary for epidemiological  and / or clinical management purposes  to differentiate between  SARS-CoV-2 and other Sarbecovirus currently known to infect humans.  If clinically indicated additional testing with an alternate test  methodology [phone]) is advised. The  SARS-CoV-2 RNA is generally  detectable in upper and lower respiratory sp ecimens during the acute  phase of infection. The expected result is Negative. Fact Sheet for Patients:  StrictlyIdeas.no Fact Sheet for Healthcare Providers: BankingDealers.co.za This test is not yet approved or cleared by the [place] FDA and has been authorized for detection and/or diagnosis of SARS-CoV-2 by FDA under an Emergency Use Authorization (EUA).  This EUA will remain in effect (meaning this test can be used) for the duration of the COVID-19 declaration under Section 564(b)(1) of the Act, 21 U.S.C. section 360bbb-3(b)(1), unless the authorization is terminated or revoked sooner. Performed at [facility], [place] [address] [place], [place] [address]   Surgical pcr screen     Status: None   Collection Time: 08/07/19  8:55 PM   Specimen: Nasal Mucosa; Nasal Swab  Result Value Ref Range Status   MRSA, PCR NEGATIVE NEGATIVE Final   Staphylococcus aureus NEGATIVE NEGATIVE Final    Comment: (NOTE) The Xpert SA Assay (FDA approved for NASAL specimens in patients 43 years of age and older), is one component of a comprehensive surveillance program. It is not intended to diagnose infection nor to guide or monitor treatment. Performed at [facility], [place] [address], [place], [place] [address]          Radiology Studies: Pelvis Portable  Result Date: 08/08/2019 CLINICAL DATA:  Postop left hip EXAM: PORTABLE PELVIS 1-2 VIEWS COMPARISON:  08/08/2019, 08/06/2019 FINDINGS: Status post left hip replacement with normal alignment. Gas in the soft tissues consistent with recent surgery. Subacute fractures left superior and inferior pubic rami. Vascular calcification IMPRESSION: 1. Left hip replacement with expected operative change 2. Subacute left superior and inferior pubic rami fractures Electronically Signed   By: Donavan Foil M.D.   On:  08/08/2019 22:51   Dg Knee Left Port  Result Date: 08/07/2019 CLINICAL DATA:  Preop left femoral neck fracture, prior left knee replacement EXAM: PORTABLE LEFT KNEE - 1-2 VIEW COMPARISON:  None. FINDINGS: Left knee arthroplasty, without evidence of complication. No fracture or dislocation is seen. Visualized soft tissues are within normal limits. No suprapatellar knee joint effusion. IMPRESSION: Left knee arthroplasty, without evidence of complication. No fracture or dislocation is seen. Electronically Signed   By: Julian Hy M.D.   On: 08/07/2019 19:11   Dg C-arm 1-60 Min  Result Date: 08/08/2019 CLINICAL DATA:  Hip fracture EXAM: DG C-ARM 61-120 MIN; OPERATIVE LEFT HIP WITH PELVIS COMPARISON:  08/06/2019 FINDINGS: Five low resolution intraoperative spot views of the left hip. Total  fluoroscopy time was 16 seconds. The images demonstrate a left hip replacement with normal alignment. There are acute left superior and inferior pubic rami fractures. IMPRESSION: 1. Status post left hip replacement with normal alignment 2. Fractures of the left inferior and superior pubic rami Electronically Signed   By: Donavan Foil M.D.   On: 08/08/2019 21:08   Dg Hip Operative Unilat W Or W/o Pelvis Left  Result Date: 08/08/2019 CLINICAL DATA:  Hip fracture EXAM: DG C-ARM 61-120 MIN; OPERATIVE LEFT HIP WITH PELVIS COMPARISON:  08/06/2019 FINDINGS: Five low resolution intraoperative spot views of the left hip. Total fluoroscopy time was 16 seconds. The images demonstrate a left hip replacement with normal alignment. There are acute left superior and inferior pubic rami fractures. IMPRESSION: 1. Status post left hip replacement with normal alignment 2. Fractures of the left inferior and superior pubic rami Electronically Signed   By: Donavan Foil M.D.   On: 08/08/2019 21:08        Scheduled Meds: . apixaban  2.5 mg Oral BID  . docusate sodium  100 mg Oral BID  . fentaNYL      . furosemide  80 mg  Intravenous BID  . levothyroxine  112 mcg Oral QAC breakfast  . metoprolol tartrate  50 mg Oral BID  . pantoprazole  40 mg Oral Daily  . potassium chloride  20 mEq Oral Daily  . rosuvastatin  20 mg Oral Daily  . spironolactone  12.5 mg Oral Daily   Continuous Infusions:    LOS: 3 days     Vernell Leep, MD, FACP, [facility]. [facility]  To contact the attending provider between 7A-7P or the covering provider during after hours 7P-7A, please log into the web site [email or web address] and access using universal [place] password for that web site. If you do not have the password, please call the hospital operator.  08/09/2019, 9:45 AM

## 2019-08-10 DIAGNOSIS — N179 Acute kidney failure, unspecified: Secondary | ICD-10-CM

## 2019-08-10 DIAGNOSIS — D62 Acute posthemorrhagic anemia: Secondary | ICD-10-CM

## 2019-08-10 LAB — BASIC METABOLIC PANEL
Anion gap: 13 (ref 5–15)
BUN: 51 mg/dL — ABNORMAL HIGH (ref 8–23)
CO2: 25 mmol/L (ref 22–32)
Calcium: 8.8 mg/dL — ABNORMAL LOW (ref 8.9–10.3)
Chloride: 98 mmol/L (ref 98–111)
Creatinine, Ser: 2.45 mg/dL — ABNORMAL HIGH (ref 0.44–1.00)
GFR calc Af Amer: 21 mL/min — ABNORMAL LOW (ref >60)
GFR calc non Af Amer: 18 mL/min — ABNORMAL LOW (ref >60)
Glucose, Bld: 115 mg/dL — ABNORMAL HIGH (ref 70–99)
Potassium: 4.3 mmol/L (ref 3.5–5.1)
Sodium: 136 mmol/L (ref 135–145)

## 2019-08-10 LAB — CBC
HCT: 36.8 % (ref 36.0–46.0)
Hemoglobin: 11.8 g/dL — ABNORMAL LOW (ref 12.0–15.0)
MCH: 31.6 pg (ref 26.0–34.0)
MCHC: 32.1 g/dL (ref 30.0–36.0)
MCV: 98.7 fL (ref 80.0–100.0)
Platelets: 223 10*3/uL (ref 150–400)
RBC: 3.73 MIL/uL — ABNORMAL LOW (ref 3.87–5.11)
RDW: 16.6 % — ABNORMAL HIGH (ref 11.5–15.5)
WBC: 15.8 10*3/uL — ABNORMAL HIGH (ref 4.0–10.5)
nRBC: 0 % (ref 0.0–0.2)

## 2019-08-10 NOTE — Progress Notes (Signed)
Patient ID: Caroline [name], female   DOB: 1941-01-30, 78 y.o.   MRN: [id number]     Advanced Heart Failure Rounding Note  PCP-Cardiologist: Ida Rogue, MD   Subjective:    Feels good this am. Did well with PT. Hip pain controlled.   Creatinine up again 1.69 -> 1.96 -> 2.45  Objective:   Weight Range: 71.6 kg Body mass index is 28.87 kg/m.   Vital Signs:   Temp:  [97.2 F (36.2 C)-98 F (36.7 C)] 97.2 F (36.2 C) (08/15 1140) Pulse Rate:  [61-68] 61 (08/15 1140) Resp:  [16] 16 (08/15 0531) BP: (95-173)/(62-77) 144/75 (08/15 1140) SpO2:  [94 %-100 %] 94 % (08/15 1140) Last BM Date: 08/06/19  Weight change: Filed Weights   08/08/19 0429 08/08/19 1731 08/09/19 0553  Weight: 76.8 kg 76.8 kg 71.6 kg    Intake/Output:   Intake/Output Summary (Last 24 hours) at 08/10/2019 1251 Last data filed at 08/10/2019 1030 Gross per 24 hour  Intake 480 ml  Output 1475 ml  Net -995 ml      Physical Exam    General:  Sitting up in bed . No resp difficulty HEENT: normal Neck: supple. JVP 7-8. Carotids 2+ bilat; no bruits. No lymphadenopathy or thryomegaly appreciated. Cor: PMI nondisplaced. Regular rate & rhythm. 2/6 MR Lungs: clear Abdomen: soft, nontender, nondistended. No hepatosplenomegaly. No bruits or masses. Good bowel sounds. Extremities: no cyanosis, clubbing, rash tr edema + ecchymosis on left Neuro: alert & orientedx3, cranial nerves grossly intact. moves all 4 extremities w/o difficulty. Affect pleasant    Telemetry   NSR 60s, personally reviewed.   Labs    CBC Recent Labs    08/09/19 0356 08/10/19 0355  WBC 16.2* 15.8*  HGB 12.2 11.8*  HCT 37.7 36.8  MCV 98.7 98.7  PLT 171 [id number]   Basic Metabolic Panel Recent Labs    08/09/19 0356 08/10/19 0355  NA 138 136  K 4.3 4.3  CL 102 98  CO2 25 25  GLUCOSE 142* 115*  BUN 35* 51*  CREATININE 1.96* 2.45*  CALCIUM 8.7* 8.8*   Liver Function Tests No results for input(s): AST, ALT, ALKPHOS, BILITOT,  PROT, ALBUMIN in the last 72 hours. No results for input(s): LIPASE, AMYLASE in the last 72 hours. Cardiac Enzymes No results for input(s): CKTOTAL, CKMB, CKMBINDEX, TROPONINI in the last 72 hours.  BNP: BNP (last 3 results) Recent Labs    07/30/19 1208  BNP 2,543.3*    ProBNP (last 3 results) No results for input(s): PROBNP in the last 8760 hours.   D-Dimer No results for input(s): DDIMER in the last 72 hours. Hemoglobin A1C No results for input(s): HGBA1C in the last 72 hours. Fasting Lipid Panel No results for input(s): CHOL, HDL, LDLCALC, TRIG, CHOLHDL, LDLDIRECT in the last 72 hours. Thyroid Function Tests No results for input(s): TSH, T4TOTAL, T3FREE, THYROIDAB in the last 72 hours.  Invalid input(s): FREET3  Other results:   Imaging    No results found.   Medications:     Scheduled Medications: . apixaban  2.5 mg Oral BID  . docusate sodium  100 mg Oral BID  . levothyroxine  112 mcg Oral QAC breakfast  . metoprolol tartrate  50 mg Oral BID  . pantoprazole  40 mg Oral Daily  . rosuvastatin  20 mg Oral Daily    Infusions:   PRN Medications: acetaminophen, HYDROcodone-acetaminophen, HYDROcodone-acetaminophen, menthol-cetylpyridinium **OR** phenol, metoCLOPramide **OR** metoCLOPramide (REGLAN) injection, morphine injection, ondansetron **OR** ondansetron (ZOFRAN) IV, senna-docusate  Assessment/Plan   1. Acute on chronic diastolic CHF: Echo in 123XX123 showed EF 60-65%, mild LVH, normal-appearing RV with PASP 60 mmHg, mild-moderate mitral stenosis. 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  RHC in 8/20 showed elevated right and left heart filling pressures with mixed pulmonary venous/pulmonary arterial hypertension. She has been diuresed with IV Lasix. Weight down 12 pounds today (?). CVP looks good. Creatinine up to 2.245 - Hold lasix. Follow daily BMETS - Continue spironolactone 12.5 daily.  - I think we  can cancel PYP scan for now as I expect her CHF is related to mitral regurgitation and CAD.  2. Pulmonary hypertension: Mixed pulmonary venous/pulmonary arterial hypertension on 8/20 RHC.  Suspect PAH component may be due to pulmonary vascular changes in the setting of severe MR over time.  - Now diuresed. Can consider sildenafil prior to d/c or as outpatient.  3. 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  She would not be a candidate for Mitraclip or mitral valve repair based on the morphology of the valve.  She has seen Dr. Roxy Manns with [facility], would need CABG-MV replacement. This will be a fairly high risk procedure given active CHF, pulmonary hypertension, and deconditioning.  Prior to surgery, she would need better control of CHF as well as physical therapy.  4. Atrial fibrillation: Paroxysmal.  She is in NSR today.  - Eliquis restarted at 2.5 mg bid post-op, can increased to 5 mg bid in 48 hrs per surgeon.   - Continue metoprolol.  5. PAD: No claudication but legs fatigue easily. Recent ABIs were moderately decreased.  She will be seeing vascular surgery in [place].  6. Hyperlipidemia: Goal LDL < 70 with vascular disease, LDL was 134 when recently checked.  - Statin changed to more potent Crestor.   7. AKI on CKD stage 3.  Baseline creatinine around 1.5, up to 2.45 today (suspect diuresis + possible peri-operative hypotension/ATN). .  - Hold diuretics. Follow closely. Support BP as needed. Avoid hypotension 8. CAD: Severe 3 vessel disease on 8/20 cath.  No chest pain.  - Ideally, would have CABG with MV replacement.  - Continue statin as above.  - She is on Eliquis so not getting aspirin. 9. S/p left THR after mechanical fall.  Post-op management per surgery, will need PT and improved mobility to get her to the point where she would be ready for her cardiac surgery.    Length of Stay: [place], MD   08/10/2019, 12:51 PM  Advanced Heart Failure Team Pager [phone] (M-F; 7a - 4p)  Please contact [facility] for night-coverage after hours (4p -7a ) and weekends on [email or web address]

## 2019-08-10 NOTE — Progress Notes (Signed)
   Subjective:  Patient reports pain as mild.  She has already been up with therapy and walked to the hallway and to the bathroom.  She has improvements in pain since surgery.  She denies shortness of breath, fever, night sweats or chills.  Objective:   VITALS:   Vitals:   08/09/19 2257 08/10/19 0025 08/10/19 0531 08/10/19 0907  BP: (!) 146/69 (!) 159/62 (!) 173/69 103/77  Pulse: 65 62 68 67  Resp:  16 16   Temp:   97.9 F (36.6 C) 97.6 F (36.4 C)  TempSrc:   Oral Oral  SpO2: 94% 96% 97% 100%  Weight:      Height:        Neurovascular intact Intact pulses distally Dorsiflexion/Plantar flexion intact Incision: dressing C/D/I No cellulitis present Compartment soft -Some bruising and ecchymosis noted along the medial calf consistent with previous fall.  Lab Results  Component Value Date   WBC 15.8 (H) 08/10/2019   HGB 11.8 (L) 08/10/2019   HCT 36.8 08/10/2019   MCV 98.7 08/10/2019   PLT 223 08/10/2019   BMET    Component Value Date/Time   NA 136 08/10/2019 0355   NA 144 05/31/2017 1524   K 4.3 08/10/2019 0355   CL 98 08/10/2019 0355   CO2 25 08/10/2019 0355   GLUCOSE 115 (H) 08/10/2019 0355   BUN 51 (H) 08/10/2019 0355   BUN 50 (H) 05/31/2017 1524   CREATININE 2.45 (H) 08/10/2019 0355   CALCIUM 8.8 (L) 08/10/2019 0355   GFRNONAA 18 (L) 08/10/2019 0355   GFRAA 21 (L) 08/10/2019 0355     Assessment/Plan: 2 Days Post-Op   Principal Problem:   Fracture of femoral neck, left, closed ([place]) Active Problems:   HTN (hypertension)   Paroxysmal atrial fibrillation (HCC)   Hypothyroidism, unspecified   Chronic diastolic CHF (congestive heart failure) (HCC)   Pulmonary hypertension (HCC)   Mitral regurgitation and mitral stenosis   CKD (chronic kidney disease) stage 3, GFR 30-59 ml/min (HCC)   History of CVA (cerebrovascular accident)   Up with therapy -Okay for full weightbearing as tolerated  -Maintain postoperative bandage.  -Eliquis 2.5 mg twice daily  for DVT prophylaxis.  -Disposition planning per primary service. Nicholes Stairs 08/10/2019, 11:06 AM   Geralynn Rile, MD [phone]

## 2019-08-10 NOTE — Progress Notes (Signed)
Physical Therapy Treatment Patient Details Name: Caroline [name] MRN: [id number] DOB: 10/22/1941 Today's Date: 08/10/2019    History of Present Illness 78 y.o. female admitted on 08/06/19 after falling at the cardiologist office sustaining a L hip fx and pelvic ring fracture (pelvis might be sub acute), s/p L direct anterior THA WBAT post op on 08/08/19.  Pt with significant PMH of tricuspid regurgitation, stroke, PAF, mitral regurgitation and mitral stenosis, HTN, CAD, CKD stage IV, chronic diastolic heart failure, L TKA, and planned heart surgery next month (which will likely be postponed).      PT Comments    Pt seemed a little more delayed today however demonstrated increased transfer and ambulation tolerance. Pt amb 84' with RW and minimal pain. Pt given THA exercise HEP as well. Pt strongly desires to return home with spouse. Acute PT to cont to follow to progress indep with mobility.    Follow Up Recommendations  Home health PT;Supervision for mobility/OOB     Equipment Recommendations  None recommended by PT    Recommendations for Other Services       Precautions / Restrictions Precautions Precautions: Fall Precaution Comments: No hip ROM precautions Restrictions Weight Bearing Restrictions: Yes LLE Weight Bearing: Weight bearing as tolerated    Mobility  Bed Mobility Overal bed mobility: Needs Assistance Bed Mobility: Supine to Sit     Supine to sit: Min assist;HOB elevated     General bed mobility comments: Min assist to help progress left leg over the side of the bed.  Cues for technique, HOB moderately elevated, pt using bed rail, warm up exercises preformed prior to getting up.   Transfers Overall transfer level: Needs assistance Equipment used: Rolling walker (2 wheeled) Transfers: Sit to/from Stand Sit to Stand: Min assist         General transfer comment: Min assit to help pt power up to stanidng from elevated bed.   Ambulation/Gait Ambulation/Gait  assistance: Min assist Gait Distance (Feet): 70 Feet Assistive device: Rolling walker (2 wheeled) Gait Pattern/deviations: Step-to pattern;Antalgic Gait velocity: slow Gait velocity interpretation: <1.8 ft/sec, indicate of risk for recurrent falls General Gait Details: pt with L antalgic limp, pt with SOB due to "I can't breathe with this mask on" pt with 3 standing rest breaks, max directional verbal cues for walker management   Stairs             Wheelchair Mobility    Modified Rankin (Stroke Patients Only)       Balance Overall balance assessment: Needs assistance Sitting-balance support: Feet supported;No upper extremity supported Sitting balance-Leahy Scale: Good     Standing balance support: Bilateral upper extremity supported Standing balance-Leahy Scale: Fair                              Cognition Arousal/Alertness: Awake/alert Behavior During Therapy: WFL for tasks assessed/performed Overall Cognitive Status: Impaired/Different from baseline Area of Impairment: Problem solving;Memory                     Memory: Decreased short-term memory       Problem Solving: Requires verbal cues;Requires tactile cues;Slow processing General Comments: Pt with problem remembering directions. ie. turn around and head to the chair, pt went to bed, then reported need to use bathroom brought [facility] and she said "I thought we were going to the chair"      Exercises Total Joint Exercises Ankle Circles/Pumps: AROM;Both;10 reps [employer]  Sets: AROM;Left;10 reps Gluteal Sets: AROM;Both;10 reps;Supine    General Comments        Pertinent Vitals/Pain Pain Assessment: 0-10 Pain Score: 7  Pain Location: L hip Pain Descriptors / Indicators: Grimacing;Guarding Pain Intervention(s): RN gave pain meds during session    Home Living                      Prior Function            PT Goals (current goals can now be found in the care plan section)  Acute Rehab PT Goals Patient Stated Goal: get better Progress towards PT goals: Progressing toward goals    Frequency    Min 5X/week      PT Plan Current plan remains appropriate    Co-evaluation              AM-PAC PT "6 Clicks" Mobility   Outcome Measure  Help needed turning from your back to your side while in a flat bed without using bedrails?: A Little Help needed moving from lying on your back to sitting on the side of a flat bed without using bedrails?: A Little Help needed moving to and from a bed to a chair (including a wheelchair)?: A Little Help needed standing up from a chair using your arms (e.g., wheelchair or bedside chair)?: A Little Help needed to walk in hospital room?: A Little Help needed climbing 3-5 steps with a railing? : A Little 6 Click Score: 18    End of Session Equipment Utilized During Treatment: Gait belt Activity Tolerance: Patient limited by pain Patient left: in chair;with call bell/phone within reach;with family/visitor present Nurse Communication: Mobility status PT Visit Diagnosis: Muscle weakness (generalized) (M62.81);Difficulty in walking, not elsewhere classified (R26.2);Pain Pain - Right/Left: Left Pain - part of body: Hip     Time: 1240-1312 PT Time Calculation (min) (ACUTE ONLY): 32 min  Charges:  $Gait Training: 8-22 mins $Therapeutic Exercise: 8-22 mins                     Caroline [name], PT, DPT Acute Rehabilitation Services Pager #: [phone] Office #: [phone]    Caroline [name] 08/10/2019, 2:35 PM

## 2019-08-10 NOTE — Progress Notes (Signed)
PROGRESS NOTE   Caroline [name]  [id number]    DOB: Jun 18, 1941    DOA: 08/06/2019  PCP: Remi Haggard, FNP   I have briefly reviewed patients previous medical records in [facility].  Chief Complaint  Patient presents with  . Hip Pain  . Fall    Brief Narrative:  78 year old female, PMH of chronic diastolic CHF, paroxysmal A. fib on Eliquis, CVA, HTN, HLD, hypothyroid, CKD 3, recent RHC/LHC 8/20 when diagnosed with severe mixed pulmonary venous/artery artery hypertension, low cardiac output and severe three-vessel coronary artery disease, seen [facility] for MVR/CABG, was at OP cardiologist office for follow-up on day of admission when she tripped while getting out of her wheelchair, fell on her left hip and admitted to the hospital for left femoral neck fracture.  Cardiology and orthopedics consulted.  S/p left THA 8/13.  Course complicated by progressive acute on chronic kidney disease, likely related to aggressive diuresis.   Assessment & Plan:   Principal Problem:   Fracture of femoral neck, left, closed ([place]) Active Problems:   HTN (hypertension)   Paroxysmal atrial fibrillation (HCC)   Hypothyroidism, unspecified   Chronic diastolic CHF (congestive heart failure) (HCC)   Pulmonary hypertension (HCC)   Mitral regurgitation and mitral stenosis   CKD (chronic kidney disease) stage 3, GFR 30-59 ml/min (HCC)   History of CVA (cerebrovascular accident)   Left femoral neck fracture, left pubic rami fracture and left sacral ala fracture, s/p left THA anterior approach 8/13  Sustained status post mechanical fall  Suspect underlying osteoporosis contributing.  Thereby patient will need to undergo bone density study within the next 4 weeks or start a bisphosphonate.  Orthopedic/Dr. Lyla Glassing consulted and after preop cardiac optimization and clearance (moderate to high risk) underwent uneventful left THA 8/13.  Eliquis was on hold preop, resumed low-dose 2.5 mg twice daily  day 1 postop and to be increased to 5 mg twice daily in 48 hours as per orthopedic recommendations.  Nonoperative management for pubic rami and sacral fractures.  As per orthopedics, WBAT on left lower extremity.  Did well with PT yesterday, PT recommends home health PT.  Improving.  Acute on chronic diastolic CHF  TTE [id number]: LVEF 60-65%, mild to moderate mitral stenosis.  TEE 8/20: Normal EF, rheumatic appearing mitral valve and severe MR/mild MS  RHC 8/20: Findings consistent with mixed pulmonary venous/pulmonary artery hypertension  Cardiology following.  Myeloma panel was negative.  Eventually will need cardiac amyloidosis and PYP scan deferred for now.  Preop patient was diuresed with IV Lasix 80 mg twice daily, Aldactone 12.5 mg daily and received a dose of metolazone on day of surgery.  Intake output and weight recordings probably not fully accurate.  Due to rising creatinine, 8/14 morning dose of IV Lasix was held & received 8/14 evening dose IV Lasix 80 mg.  This morning creatinine has further worsened from 1.9 > 2.45.  Discontinued IV Lasix and Aldactone for now, await cardiology follow-up today.  Still has volume overload but diuretic use currently limited due to worsening acute kidney injury.  Acute respiratory failure with hypoxia  Likely related to decompensated CHF.  Resolved and currently on room air.  Severe three-vessel CAD  Diagnosed by cardiac cath 8/20.  As indicated above, eventual plans for mitral valve replacement and CABG.  Continue metoprolol and rosuvastatin.  Eliquis resumed day 1 postop on 8/14.  Mixed pulmonary venous/pulmonary artery hypertension  Noted on recent [place] 8/20  Cardiology considering sildenafil use after diuresis.  Mitral valve disease  TEE 8/20 showed rheumatic appearing valve with severe mitral valve regurgitation and mild mitral stenosis.  Not a candidate for mitral clip or mitral valve repair based on valve morphology.   She has seen Dr. Roxy Manns with [facility] and awaiting MVR/CABG.  Paroxysmal atrial fibrillation  Remains in sinus rhythm.  Continue metoprolol  Eliquis held from morning of 8/11 through day of surgery 8/13.  Resumed reduced dose 2.5 mg twice daily day 1 postop on 8/14 to be increased to 5 mg twice daily in 48 hours postop as per orthopedic recommendations.  Hyperlipidemia  Continue Crestor  Acute kidney injury complicating stage III CKD  Recent Baseline creatinine may be in the 1.3-1.4 range.  Presented with creatinine of 1.62, may be related to lisinopril, diuretic use and cardiogenic.  Lisinopril held.    Creatinine has steadily increased from 1.59 > 1.69 > 1.95 >2.5, likely related to IV/p.o. diuresis and hemodynamics, unable to clearly see if patient did become hypotensive in the intra-/peri-op.  Due to rising creatinine, 8/14 morning dose of IV Lasix was held & received 8/14 evening dose IV Lasix 80 mg.  This morning creatinine has further worsened from 1.9 > 2.45.  Discontinued IV Lasix and Aldactone for now, await cardiology follow-up today.  Follow BMP daily.  Essential hypertension  Reasonably controlled.  Continue metoprolol 50 mg twice daily.  Holding home lisinopril due to acute kidney injury and ongoing aggressive IV diuresis.  Hypothyroid  Continue Synthroid 112 mcg daily.  History of CVA, type not known  Continue rosuvastatin.  Not on aspirin due to being on Eliquis.  Obesity/Body mass index is 28.87 kg/m.  Nausea and Vomiting -Resolved.  Continue PPI and PRN Zofran.  Postop acute blood loss anemia  Mild.  Continue to follow CBCs daily.  Transfuse if hemoglobin 7 g per DL or less.  DVT prophylaxis: SCDs Code Status: Full Family Communication: I discussed with with spouse and son separately via phone on 8/14, updated care and answered questions.  None at bedside today. Disposition: DC home with home health pending clinical improvement, probably in the next 48  to 72 hours.   Consultants:  Cardiology Orthopedics  Procedures:  Left total hip arthroplasty, anterior approach on 8/13.  Closed management of pelvic ring fracture.  Antimicrobials:  None   Subjective: "I did very well yesterday".  Reports that she ambulated with PT yesterday from her bed to the door and back and to the bathroom.  This morning stood up next to the bed and cleaned up.  Left hip had appropriate soreness post activity yesterday, still mildly sore today.  No chest pain or dyspnea.  Objective:  Vitals:   08/09/19 2200 08/09/19 2257 08/10/19 0025 08/10/19 0531  BP: (!) 169/70 (!) 146/69 (!) 159/62 (!) 173/69  Pulse: 67 65 62 68  Resp:   16 16  Temp:    97.9 F (36.6 C)  TempSrc:    Oral  SpO2: 99% 94% 96% 97%  Weight:      Height:        Examination:  General exam: Pleasant elderly female, moderately built and obese lying comfortably propped up in bed without distress.  Oral mucosa moist. Respiratory system: Clear to auscultation.  No increased work of breathing. Cardiovascular system: S1 and S2 heard, RRR.  No JVD.  3/6 pansystolic murmur best heard at apex.  Trace bilateral leg edema, left >right.  Telemetry personally reviewed: Remains in sinus rhythm. Gastrointestinal system: Abdomen is nondistended, soft and  nontender. No organomegaly or masses felt. Normal bowel sounds heard.  Stable. Central nervous system: Alert and oriented. No focal neurological deficits. Extremities: Symmetric 5 x 5 power.  Left lower extremity power unable to assess secondary to postop pain but at least grade 4 x 5 power.  Left hip postop dressing clean and dry.  Some old bruising of left calf region from fall sustained 4 weeks ago without worsening.  She has approximately 1 cm size right shin healing ulcer with scab and no acute findings.  Stable. Skin: No rashes, lesions or ulcers Psychiatry: Judgement and insight appear normal. Mood & affect appropriate.     Data Reviewed: I  have personally reviewed following labs and imaging studies  CBC: Recent Labs  Lab 08/06/19 1807 08/07/19 0525 08/08/19 0637 08/09/19 0356 08/10/19 0355  WBC 16.1* 11.4* 12.1* 16.2* 15.8*  NEUTROABS 12.2*  --   --   --   --   HGB 14.8 14.7 13.9 12.2 11.8*  HCT 47.0* 45.0 43.4 37.7 36.8  MCV 100.9* 98.7 99.1 98.7 98.7  PLT 211 194 189 171 [id number]   Basic Metabolic Panel: Recent Labs  Lab 08/06/19 1807 08/07/19 0525 08/08/19 0637 08/09/19 0356 08/10/19 0355  NA 141 141 138 138 136  K 4.8 3.7 3.7 4.3 4.3  CL 103 105 103 102 98  CO2 25 24 25 25 25   GLUCOSE 113* 113* 110* 142* 115*  BUN 34* 29* 30* 35* 51*  CREATININE 1.66* 1.59* 1.69* 1.96* 2.45*  CALCIUM 9.2 9.3 8.8* 8.7* 8.8*   Liver Function Tests: No results for input(s): AST, ALT, ALKPHOS, BILITOT, PROT, ALBUMIN in the last 168 hours.  Cardiac Enzymes: No results for input(s): CKTOTAL, CKMB, CKMBINDEX, TROPONINI in the last 168 hours.  CBG: No results for input(s): GLUCAP in the last 168 hours.  Recent Results (from the past 240 hour(s))  SARS Coronavirus 2 [facility] order, Performed in [place] hospital lab)     Status: None   Collection Time: 08/07/19  8:12 AM  Result Value Ref Range Status   SARS Coronavirus 2 NEGATIVE NEGATIVE Final    Comment: (NOTE) If result is NEGATIVE SARS-CoV-2 target nucleic acids are NOT DETECTED. The SARS-CoV-2 RNA is generally detectable in upper and lower  respiratory specimens during the acute phase of infection. The lowest  concentration of SARS-CoV-2 viral copies this assay can detect is 250  copies / mL. A negative result does not preclude SARS-CoV-2 infection  and should not be used as the sole basis for treatment or other  patient management decisions.  A negative result may occur with  improper specimen collection / handling, submission of specimen other  than nasopharyngeal swab, presence of viral mutation(s) within the  areas targeted by this assay, and inadequate  number of viral copies  (<250 copies / mL). A negative result must be combined with clinical  observations, patient history, and epidemiological information. If result is POSITIVE SARS-CoV-2 target nucleic acids are DETECTED. The SARS-CoV-2 RNA is generally detectable in upper and lower  respiratory specimens dur ing the acute phase of infection.  Positive  results are indicative of active infection with SARS-CoV-2.  Clinical  correlation with patient history and other diagnostic information is  necessary to determine patient infection status.  Positive results do  not rule out bacterial infection or co-infection with other viruses. If result is PRESUMPTIVE POSTIVE SARS-CoV-2 nucleic acids MAY BE PRESENT.   A presumptive positive result was obtained on the submitted specimen  and confirmed  on repeat testing.  While 2019 novel coronavirus  (SARS-CoV-2) nucleic acids may be present in the submitted sample  additional confirmatory testing may be necessary for epidemiological  and / or clinical management purposes  to differentiate between  SARS-CoV-2 and other Sarbecovirus currently known to infect humans.  If clinically indicated additional testing with an alternate test  methodology [phone]) is advised. The SARS-CoV-2 RNA is generally  detectable in upper and lower respiratory sp ecimens during the acute  phase of infection. The expected result is Negative. Fact Sheet for Patients:  StrictlyIdeas.no Fact Sheet for Healthcare Providers: BankingDealers.co.za This test is not yet approved or cleared by the [place] FDA and has been authorized for detection and/or diagnosis of SARS-CoV-2 by FDA under an Emergency Use Authorization (EUA).  This EUA will remain in effect (meaning this test can be used) for the duration of the COVID-19 declaration under Section 564(b)(1) of the Act, 21 U.S.C. section 360bbb-3(b)(1), unless the  authorization is terminated or revoked sooner. Performed at [facility], [place] [address], [place], [place] [address]   Surgical pcr screen     Status: None   Collection Time: 08/07/19  8:55 PM   Specimen: Nasal Mucosa; Nasal Swab  Result Value Ref Range Status   MRSA, PCR NEGATIVE NEGATIVE Final   Staphylococcus aureus NEGATIVE NEGATIVE Final    Comment: (NOTE) The Xpert SA Assay (FDA approved for NASAL specimens in patients 44 years of age and older), is one component of a comprehensive surveillance program. It is not intended to diagnose infection nor to guide or monitor treatment. Performed at [facility], [place] [address] [place], [place] [address]          Radiology Studies: Pelvis Portable  Result Date: 08/08/2019 CLINICAL DATA:  Postop left hip EXAM: PORTABLE PELVIS 1-2 VIEWS COMPARISON:  08/08/2019, 08/06/2019 FINDINGS: Status post left hip replacement with normal alignment. Gas in the soft tissues consistent with recent surgery. Subacute fractures left superior and inferior pubic rami. Vascular calcification IMPRESSION: 1. Left hip replacement with expected operative change 2. Subacute left superior and inferior pubic rami fractures Electronically Signed   By: Donavan Foil M.D.   On: 08/08/2019 22:51   Dg C-arm 1-60 Min  Result Date: 08/08/2019 CLINICAL DATA:  Hip fracture EXAM: DG C-ARM 61-120 MIN; OPERATIVE LEFT HIP WITH PELVIS COMPARISON:  08/06/2019 FINDINGS: Five low resolution intraoperative spot views of the left hip. Total fluoroscopy time was 16 seconds. The images demonstrate a left hip replacement with normal alignment. There are acute left superior and inferior pubic rami fractures. IMPRESSION: 1. Status post left hip replacement with normal alignment 2. Fractures of the left inferior and superior pubic rami Electronically Signed   By: Donavan Foil M.D.   On: 08/08/2019 21:08   Dg Hip Operative Unilat W Or W/o Pelvis Left  Result Date: 08/08/2019  CLINICAL DATA:  Hip fracture EXAM: DG C-ARM 61-120 MIN; OPERATIVE LEFT HIP WITH PELVIS COMPARISON:  08/06/2019 FINDINGS: Five low resolution intraoperative spot views of the left hip. Total fluoroscopy time was 16 seconds. The images demonstrate a left hip replacement with normal alignment. There are acute left superior and inferior pubic rami fractures. IMPRESSION: 1. Status post left hip replacement with normal alignment 2. Fractures of the left inferior and superior pubic rami Electronically Signed   By: Donavan Foil M.D.   On: 08/08/2019 21:08        Scheduled Meds: . apixaban  2.5 mg Oral BID  .  docusate sodium  100 mg Oral BID  . levothyroxine  112 mcg Oral QAC breakfast  . metoprolol tartrate  50 mg Oral BID  . pantoprazole  40 mg Oral Daily  . rosuvastatin  20 mg Oral Daily   Continuous Infusions:    LOS: 4 days     Vernell Leep, MD, FACP, [facility]. [facility]  To contact the attending provider between 7A-7P or the covering provider during after hours 7P-7A, please log into the web site [email or web address] and access using universal [place] password for that web site. If you do not have the password, please call the hospital operator.  08/10/2019, 9:07 AM

## 2019-08-10 NOTE — Plan of Care (Signed)
  Problem: Clinical Measurements: Goal: Respiratory complications will improve Outcome: Progressing Note: No s/s of respiratory complications noted. Goal: Cardiovascular complication will be avoided Outcome: Progressing Note: No s/s of cardiovascular complications noted.   

## 2019-08-11 LAB — CBC
HCT: 32.7 % — ABNORMAL LOW (ref 36.0–46.0)
Hemoglobin: 10.8 g/dL — ABNORMAL LOW (ref 12.0–15.0)
MCH: 32 pg (ref 26.0–34.0)
MCHC: 33 g/dL (ref 30.0–36.0)
MCV: 96.7 fL (ref 80.0–100.0)
Platelets: 201 10*3/uL (ref 150–400)
RBC: 3.38 MIL/uL — ABNORMAL LOW (ref 3.87–5.11)
RDW: 16.5 % — ABNORMAL HIGH (ref 11.5–15.5)
WBC: 10.9 10*3/uL — ABNORMAL HIGH (ref 4.0–10.5)
nRBC: 0 % (ref 0.0–0.2)

## 2019-08-11 LAB — BASIC METABOLIC PANEL
Anion gap: 11 (ref 5–15)
BUN: 60 mg/dL — ABNORMAL HIGH (ref 8–23)
CO2: 25 mmol/L (ref 22–32)
Calcium: 8.4 mg/dL — ABNORMAL LOW (ref 8.9–10.3)
Chloride: 98 mmol/L (ref 98–111)
Creatinine, Ser: 2.51 mg/dL — ABNORMAL HIGH (ref 0.44–1.00)
GFR calc Af Amer: 21 mL/min — ABNORMAL LOW (ref >60)
GFR calc non Af Amer: 18 mL/min — ABNORMAL LOW (ref >60)
Glucose, Bld: 92 mg/dL (ref 70–99)
Potassium: 4 mmol/L (ref 3.5–5.1)
Sodium: 134 mmol/L — ABNORMAL LOW (ref 135–145)

## 2019-08-11 MED ORDER — APIXABAN 5 MG PO TABS
5.0000 mg | ORAL_TABLET | Freq: Two times a day (BID) | ORAL | Status: DC
  Administered 2019-08-12: 5 mg via ORAL
  Filled 2019-08-11: qty 1

## 2019-08-11 NOTE — Progress Notes (Signed)
Patient ID: Caroline [name], female   DOB: 1941-08-08, 78 y.o.   MRN: [id number]     Advanced Heart Failure Rounding Note  PCP-Cardiologist: Ida Rogue, MD   Subjective:    Walking around room this am. No CP or SOB. Weight stable 159-160  Creatinine up again 1.69 -> 1.96 -> 2.45 -> 2.5  Objective:   Weight Range: 72.5 kg Body mass index is 29.23 kg/m.   Vital Signs:   Temp:  [97.2 F (36.2 C)-98.2 F (36.8 C)] 97.4 F (36.3 C) (08/16 0810) Pulse Rate:  [60-64] 62 (08/16 0810) Resp:  [16] 16 (08/16 0452) BP: (104-202)/(48-75) 202/65 (08/16 0810) SpO2:  [94 %-100 %] 100 % (08/16 0810) Weight:  [72.5 kg] 72.5 kg (08/16 0452) Last BM Date: 08/06/19  Weight change: Filed Weights   08/09/19 0553 08/10/19 0600 08/11/19 0452  Weight: 71.6 kg 72.6 kg 72.5 kg    Intake/Output:   Intake/Output Summary (Last 24 hours) at 08/11/2019 0924 Last data filed at 08/11/2019 0453 Gross per 24 hour  Intake 440 ml  Output 1600 ml  Net -1160 ml      Physical Exam    General:  Sitting up in chair . No resp difficulty HEENT: normal Neck: supple. JVP 8. Carotids 2+ bilat; no bruits. No lymphadenopathy or thryomegaly appreciated. Cor: PMI nondisplaced. Regular rate & rhythm. No rubs, gallops or murmurs. Lungs: clear Abdomen: soft, nontender, nondistended. No hepatosplenomegaly. No bruits or masses. Good bowel sounds. Extremities: no cyanosis, clubbing, rash tr edema + ecchymosis on left Neuro: alert & orientedx3, cranial nerves grossly intact. moves all 4 extremities w/o difficulty. Affect pleasant    Telemetry   NSR 60-70, personally reviewed.   Labs    CBC Recent Labs    08/10/19 0355 08/11/19 0343  WBC 15.8* 10.9*  HGB 11.8* 10.8*  HCT 36.8 32.7*  MCV 98.7 96.7  PLT 223 [id number]   Basic Metabolic Panel Recent Labs    08/10/19 0355 08/11/19 0343  NA 136 134*  K 4.3 4.0  CL 98 98  CO2 25 25  GLUCOSE 115* 92  BUN 51* 60*  CREATININE 2.45* 2.51*  CALCIUM 8.8*  8.4*   Liver Function Tests No results for input(s): AST, ALT, ALKPHOS, BILITOT, PROT, ALBUMIN in the last 72 hours. No results for input(s): LIPASE, AMYLASE in the last 72 hours. Cardiac Enzymes No results for input(s): CKTOTAL, CKMB, CKMBINDEX, TROPONINI in the last 72 hours.  BNP: BNP (last 3 results) Recent Labs    07/30/19 1208  BNP 2,543.3*    ProBNP (last 3 results) No results for input(s): PROBNP in the last 8760 hours.   D-Dimer No results for input(s): DDIMER in the last 72 hours. Hemoglobin A1C No results for input(s): HGBA1C in the last 72 hours. Fasting Lipid Panel No results for input(s): CHOL, HDL, LDLCALC, TRIG, CHOLHDL, LDLDIRECT in the last 72 hours. Thyroid Function Tests No results for input(s): TSH, T4TOTAL, T3FREE, THYROIDAB in the last 72 hours.  Invalid input(s): FREET3  Other results:   Imaging    No results found.   Medications:     Scheduled Medications: . apixaban  2.5 mg Oral BID  . docusate sodium  100 mg Oral BID  . levothyroxine  112 mcg Oral QAC breakfast  . metoprolol tartrate  50 mg Oral BID  . pantoprazole  40 mg Oral Daily  . rosuvastatin  20 mg Oral Daily    Infusions:   PRN Medications: acetaminophen, HYDROcodone-acetaminophen, HYDROcodone-acetaminophen, menthol-cetylpyridinium **OR** phenol,  metoCLOPramide **OR** metoCLOPramide (REGLAN) injection, morphine injection, ondansetron **OR** ondansetron (ZOFRAN) IV, senna-docusate   Assessment/Plan   1. Acute on chronic diastolic CHF: Echo in [id number] showed EF 60-65%, mild LVH, normal-appearing RV with PASP 60 mmHg, mild-moderate mitral stenosis. 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  RHC in 8/20 showed elevated right and left heart filling pressures with mixed pulmonary venous/pulmonary arterial hypertension. She has been diuresed with IV Lasix. Weight back to baseline. Creatinne pumped post-op due to diuresis vs ATN from  transient hypotension - Creatinine now plateaued. Volume status stable. Hold diuretics one more day.  - Continue spironolactone 12.5 daily.  - I think we can cancel PYP scan for now as I expect her CHF is related to mitral regurgitation and CAD.  2. Pulmonary hypertension: Mixed pulmonary venous/pulmonary arterial hypertension on 8/20 RHC.  Suspect PAH component may be due to pulmonary vascular changes in the setting of severe MR over time.  - Now diuresed. Can consider sildenafil prior to d/c or as outpatient.  3. 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  She would not be a candidate for Mitraclip or mitral valve repair based on the morphology of the valve.  She has seen Dr. Roxy Manns with [facility], would need CABG-MV replacement. This will be a fairly high risk procedure given active CHF, pulmonary hypertension, and deconditioning.  Prior to surgery, she would need better control of CHF as well as physical therapy.  4. Atrial fibrillation: Paroxysmal.  She is in NSR today.  - Eliquis restarted at 2.5 mg bid post-op, can increased to 5 mg bid in 48 hrs per surgeon.   - Continue metoprolol.  5. PAD: No claudication but legs fatigue easily. Recent ABIs were moderately decreased.  She will be seeing vascular surgery in [place].  6. Hyperlipidemia: Goal LDL < 70 with vascular disease, LDL was 134 when recently checked.  - Statin changed to more potent Crestor.   7. AKI on CKD stage 3.  Baseline creatinine around 1.5 -> 2.45 -> 2.51(suspect diuresis + possible peri-operative hypotension/ATN). . - Given creatinine trend suspect ATN due to peri-op hypotension  - Hold diuretics one more day. Follow closely. Support BP as needed. Avoid hypotension 8. CAD: Severe 3 vessel disease on 8/20 cath.  No chest pain.  - Ideally, would have CABG with MV replacement.  - Continue statin as above.  - She is on Eliquis so not getting  aspirin. 9. S/p left THR after mechanical fall.  Post-op management per surgery, will need PT and improved mobility to get her to the point where she would be ready for her cardiac surgery.    Length of Stay: [place], MD  08/11/2019, 9:24 AM  Advanced Heart Failure Team Pager [phone] (M-F; 7a - 4p)  Please contact [facility] for night-coverage after hours (4p -7a ) and weekends on [email or web address]

## 2019-08-11 NOTE — Progress Notes (Signed)
   Subjective: 3 Days Post-Op Procedure(s) (LRB): TOTAL HIP ARTHROPLASTY ANTERIOR APPROACH (Left)  Pt doing well Walking without pain around the room Currently having her labs checked for possible d/c tomorrow Denies any new symptoms or issues Patient reports pain as mild.  Objective:   VITALS:   Vitals:   08/11/19 0452 08/11/19 0810  BP: 104/71 (!) 202/65  Pulse: 60 62  Resp: 16   Temp: (!) 97.4 F (36.3 C) (!) 97.4 F (36.3 C)  SpO2: 99% 100%    Left anterior hip incision healing well nv intact distally Good rom without pain No rashes or edema distally Weight bearing as tolerated  LABS Recent Labs    08/09/19 0356 08/10/19 0355 08/11/19 0343  HGB 12.2 11.8* 10.8*  HCT 37.7 36.8 32.7*  WBC 16.2* 15.8* 10.9*  PLT 171 223 201    Recent Labs    08/09/19 0356 08/10/19 0355 08/11/19 0343  NA 138 136 134*  K 4.3 4.3 4.0  BUN 35* 51* 60*  CREATININE 1.96* 2.45* 2.51*  GLUCOSE 142* 115* 92     Assessment/Plan: 3 Days Post-Op Procedure(s) (LRB): TOTAL HIP ARTHROPLASTY ANTERIOR APPROACH (Left) Left hip doing well today Will continue to monitor her progress Weight bearing as tolerated Continue PT/OT    Merla Riches PA-C, MPAS [place] [facility] [employer] [facility] [address], [place], [place] [address] Phone: [phone] [email or web address] Facebook  [employer]

## 2019-08-11 NOTE — Progress Notes (Signed)
ANTICOAGULATION CONSULT NOTE - Initial Consult  Pharmacy Consult for Eliquis Indication: atrial fibrillation  No Known Allergies  Patient Measurements: Height: 5\' 2"  (157.5 cm) Weight: 159 lb 12.8 oz (72.5 kg) IBW/kg (Calculated) : 50.1 Heparin Dosing Weight:   Vital Signs: Temp: 98.4 F (36.9 C) (08/16 1122) Temp Source: Oral (08/16 0452) BP: 170/58 (08/16 1122) Pulse Rate: 59 (08/16 1122)  Labs: Recent Labs    08/09/19 0356 08/10/19 0355 08/11/19 0343  HGB 12.2 11.8* 10.8*  HCT 37.7 36.8 32.7*  PLT 171 223 201  CREATININE 1.96* 2.45* 2.51*    Estimated Creatinine Clearance: 17.2 mL/min (A) (by C-G formula based on SCr of 2.51 mg/dL (H)).   Medical History: Past Medical History:  Diagnosis Date  . Arthritis   . CAD   . Chronic diastolic CHF (congestive heart failure) ([place])   . CKD (chronic kidney disease), stage IV ([place])   . coronary artery disease   . History of kidney stones 01/2017  . Hypertension   . Hypothyroidism   . Mitral regurgitation and mitral stenosis   . PAF (paroxysmal atrial fibrillation) ([place])    a. On Eliquis; b. CHADS2VASc => 8 (CHF, HTN, age x 2, stroke x 2, vascular disease, female)  . Stroke [facility])    TIA 9/17 & 04/06/2017  . Tricuspid regurgitation     Assessment: CC/HPI: fell getting out of wheelchair at appt >> L sided hip pain  PMH: CHF, mitral valve dx, PAF, CKD, PAD  Anticoag: Apixaban for PAF.  78 y/o, 71.6kg, Scr 1.69>1.96>2.45>2.51 rising, Hgb 13.9>11.8>10.8 down after ortho surgery 8/13.  Goal of Therapy:  Therapeutic oral anticoagulation   Plan:  Increase Eliquis to 5mg  BID starting 8/17  Tayten Bergdoll S. Alford Highland, PharmD, BCPS Clinical Staff Pharmacist Eilene Ghazi Stillinger 08/11/2019,1:08 PM

## 2019-08-11 NOTE — Evaluation (Signed)
Occupational Therapy Evaluation Patient Details Name: Caroline [name] MRN: [id number] DOB: 1941-08-13 Today's Date: 08/11/2019    History of Present Illness 78 y.o. female admitted on 08/06/19 after falling at the cardiologist office sustaining a L hip fx and pelvic ring fracture (pelvis might be sub acute), s/p L direct anterior THA WBAT post op on 08/08/19.  Pt with significant PMH of tricuspid regurgitation, stroke, PAF, mitral regurgitation and mitral stenosis, HTN, CAD, CKD stage IV, chronic diastolic heart failure, L TKA, and planned heart surgery next month (which will likely be postponed).     Clinical Impression   Pt PTA: pt living with spouse and mostly independent with mobility and ADL. Pt currently limited by pain in L hip leading to decreased strength, poor mobility and increased assist required for ADL. Pt with cognitive deficits for attention, problem solving and memory. Pt distracted by NT entering room and leaned on elbows with RW a few steps away from destination of recliner when ambulating from sink. Pt required verbal cues and tactile cues to complete transfer. Pt minA for transfers and minguardA for mobility with RW. Pt set-upA for UB ADL and min to modA for LB ADL. Pt would greatly benefit from continued OT skilled services for ADL, mobility and safety. OT following.  HR <95 during exertion.      Follow Up Recommendations  Home health OT;Supervision/Assistance - 24 hour    Equipment Recommendations  None recommended by OT    Recommendations for Other Services       Precautions / Restrictions Precautions Precautions: Fall Precaution Comments: No hip ROM precautions Restrictions Weight Bearing Restrictions: No LLE Weight Bearing: Weight bearing as tolerated      Mobility Bed Mobility Overal bed mobility: Needs Assistance Bed Mobility: Supine to Sit     Supine to sit: Min assist;HOB elevated     General bed mobility comments: Min assist to help progress  left leg over the side of the bed.  Cues for technique,   Transfers Overall transfer level: Needs assistance Equipment used: Rolling walker (2 wheeled) Transfers: Sit to/from Stand Sit to Stand: Min assist         General transfer comment: Min assit to help pt power up to stand    Balance Overall balance assessment: Needs assistance Sitting-balance support: Feet supported;No upper extremity supported Sitting balance-Leahy Scale: Good     Standing balance support: Bilateral upper extremity supported;During functional activity Standing balance-Leahy Scale: Fair                             ADL either performed or assessed with clinical judgement   ADL Overall ADL's : Needs assistance/impaired Eating/Feeding: Modified independent;Sitting   Grooming: Min guard;Standing   Upper Body Bathing: Min guard;Standing   Lower Body Bathing: Minimal assistance;Sitting/lateral leans;Sit to/from stand   Upper Body Dressing : Min guard;Standing   Lower Body Dressing: Moderate assistance;Sitting/lateral leans;Sit to/from stand Lower Body Dressing Details (indicate cue type and reason): RLE mod I with figure 4; LLE requires assist to start task Toilet Transfer: Min guard;Minimal assistance;BSC;Grab bars;Ambulation   Toileting- Clothing Manipulation and Hygiene: Min guard;Cueing for safety;Sitting/lateral lean;Sit to/from stand       Functional mobility during ADLs: Min guard;Rolling walker;Cueing for safety;Cueing for sequencing General ADL Comments: Pt standing at sink for light ADL with minguardA. Pt limited by pain in L hip and poor activity tolerance.     Vision Baseline Vision/History: Wears glasses Wears Glasses: Distance  only Patient Visual Report: No change from baseline Vision Assessment?: No apparent visual deficits     Perception     Praxis      Pertinent Vitals/Pain Pain Assessment: 0-10 Pain Score: 3  Pain Location: L hip Pain Descriptors /  Indicators: Grimacing;Guarding Pain Intervention(s): Monitored during session     Hand Dominance Right   Extremity/Trunk Assessment Upper Extremity Assessment Upper Extremity Assessment: Generalized weakness   Lower Extremity Assessment Lower Extremity Assessment: Generalized weakness LLE Deficits / Details: s/p L hip sx   Cervical / Trunk Assessment Cervical / Trunk Assessment: Normal   Communication Communication Communication: No difficulties   Cognition   Behavior During Therapy: WFL for tasks assessed/performed Overall Cognitive Status: Impaired/Different from baseline Area of Impairment: Problem solving;Memory                     Memory: Decreased short-term memory       Problem Solving: Requires verbal cues;Requires tactile cues;Slow processing General Comments: Pt distracted by NT entering room and leaned on elbows with RW a few steps away from destination of recliner when ambulating from sink. Pt required verbal cues and tactile cues to complete transfer.   General Comments  O2 >90% on RA and HR 87-95 BPM with activity     Exercises     Shoulder Instructions      Home Living Family/patient expects to be discharged to:: Private residence Living Arrangements: Spouse/significant other Available Help at Discharge: Family;Available 24 hours/day Type of Home: House Home Access: Stairs to enter [employer] of Steps: 3 Entrance Stairs-Rails: Left Home Layout: One level     Bathroom Shower/Tub: Tub/shower unit;Door   [employer] Toilet: Standard     Home Equipment: Environmental consultant - 2 wheels;Bedside commode;Hospital bed          Prior Functioning/Environment Level of Independence: Independent                 OT Problem List: Decreased strength;Decreased activity tolerance;Impaired balance (sitting and/or standing);Decreased safety awareness;Pain;Decreased cognition      OT Treatment/Interventions:      OT Goals(Current goals can be  found in the care plan section) Acute Rehab OT Goals Patient Stated Goal: get better OT Goal Formulation: With patient Time For Goal Achievement: 08/25/19 Potential to Achieve Goals: Good ADL Goals Pt Will Perform Lower Body Dressing: with modified independence;sit to/from stand Pt Will Transfer to Toilet: with modified independence;ambulating Pt Will Perform Toileting - Clothing Manipulation and hygiene: with modified independence;sitting/lateral leans;sit to/from stand Additional ADL Goal #1: Pt to perform ADL tasks standing at sink x7 mins with no leaning on elbows doe energy conservtion  OT Frequency: Min 2X/week   Barriers to D/C:            Co-evaluation              AM-PAC OT "6 Clicks" Daily Activity     Outcome Measure Help from another person eating meals?: None Help from another person taking care of personal grooming?: A Little Help from another person toileting, which includes using toliet, bedpan, or urinal?: A Little Help from another person bathing (including washing, rinsing, drying)?: A Little Help from another person to put on and taking off regular upper body clothing?: A Little Help from another person to put on and taking off regular lower body clothing?: A Little 6 Click Score: 19   End of Session Equipment Utilized During Treatment: Gait belt;Rolling walker Nurse Communication: Mobility status  Activity Tolerance: Patient  tolerated treatment well;Patient limited by pain Patient left: in chair;with call bell/phone within reach;with nursing/sitter in room  OT Visit Diagnosis: Unsteadiness on feet (R26.81);Muscle weakness (generalized) (M62.81);Pain Pain - Right/Left: Left Pain - part of body: Hip                Time: 0733-0802 OT Time Calculation (min): 29 min Charges:  OT General Charges $OT Visit: 1 Visit OT Evaluation $OT Eval Moderate Complexity: 1 Mod OT Treatments $Self Care/Home Management : 8-22 mins  Darryl Nestle) Marsa Aris  OTR/L Acute Rehabilitation Services Pager: [phone] Office: [place] 08/11/2019, 8:48 AM

## 2019-08-11 NOTE — Progress Notes (Signed)
PROGRESS NOTE   Caroline [name]  [id number]    DOB: Jun 12, 1941    DOA: 08/06/2019  PCP: Remi Haggard, FNP   I have briefly reviewed patients previous medical records in [facility]  Chief Complaint  Patient presents with   Hip Pain   Fall    Brief Narrative:  78 year old female, PMH of chronic diastolic CHF, paroxysmal A. fib on Eliquis, CVA, HTN, HLD, hypothyroid, CKD 3, recent RHC/LHC 8/20 when diagnosed with severe mixed pulmonary venous/artery artery hypertension, low cardiac output and severe three-vessel coronary artery disease, seen [facility] for MVR/CABG, was at OP cardiologist office for follow-up on day of admission when she tripped while getting out of her wheelchair, fell on her left hip and admitted to the hospital for left femoral neck fracture.  Cardiology and orthopedics consulted.  S/p left THA 8/13.  Course complicated by progressive acute on chronic kidney disease, likely related to aggressive diuresis.   Assessment & Plan:   Principal Problem:   Fracture of femoral neck, left, closed (HCC) Active Problems:   HTN (hypertension)   Paroxysmal atrial fibrillation (HCC)   Hypothyroidism, unspecified   Acute on chronic diastolic (congestive) heart failure (HCC)   Pulmonary hypertension (HCC)   Mitral regurgitation and mitral stenosis   CKD (chronic kidney disease) stage 3, GFR 30-59 ml/min (HCC)   History of CVA (cerebrovascular accident)   AKI (acute kidney injury) ([place])   Left femoral neck fracture, left pubic rami fracture and left sacral ala fracture, s/p left THA anterior approach 8/13  Sustained status post mechanical fall  Suspect underlying osteoporosis contributing.  Thereby patient will need to undergo bone density study within the next 4 weeks or start a bisphosphonate.  Orthopedic/Dr. Lyla Glassing consulted and after preop cardiac optimization and clearance (moderate to high risk) underwent uneventful left THA 8/13.  Eliquis was on hold  preop, resumed low-dose 2.5 mg twice daily day 1 postop and to be increased to 5 mg twice daily in 48 hours as per orthopedic recommendations.  Nonoperative management for pubic rami and sacral fractures.  As per orthopedics, WBAT on left lower extremity.  Progressing well and stable for discharge from orthopedic standpoint.  Acute on chronic diastolic CHF  TTE [id number]: LVEF 60-65%, mild to moderate mitral stenosis.  TEE 8/20: Normal EF, rheumatic appearing mitral valve and severe MR/mild MS  RHC 8/20: Findings consistent with mixed pulmonary venous/pulmonary artery hypertension  Cardiology following.  Myeloma panel was negative.  Eventually will need cardiac amyloidosis and PYP scan deferred for now.  Preop patient was diuresed with IV Lasix 80 mg twice daily, Aldactone 12.5 mg daily and received a dose of metolazone on day of surgery.  Intake output and weight recordings probably not fully accurate.  Due to rising creatinine, 8/14 morning dose of IV Lasix was held & received 8/14 evening dose IV Lasix 80 mg.  This morning creatinine has further worsened from 1.9 > 2.45.  Discontinued IV Lasix and Aldactone for now, await cardiology follow-up today.  Still has volume overload but diuretic use currently limited due to worsening acute kidney injury.  Creatinine stable compared to yesterday.  Continue to hold diuretics for additional 24 hours.  Patient seen along with Dr. Haroldine Laws.  Acute respiratory failure with hypoxia  Likely related to decompensated CHF.  Resolved and currently on room air.  Severe three-vessel CAD  Diagnosed by cardiac cath 8/20.  As indicated above, eventual plans for mitral valve replacement and CABG.  Continue metoprolol and rosuvastatin.  Eliquis resumed day 1 postop on 8/14.  Mixed pulmonary venous/pulmonary artery hypertension  Noted on recent [place] 8/20  Cardiology considering sildenafil use after diuresis.  Mitral valve disease  TEE 8/20 showed  rheumatic appearing valve with severe mitral valve regurgitation and mild mitral stenosis.  Not a candidate for mitral clip or mitral valve repair based on valve morphology.  She has seen Dr. Roxy Manns with [facility] and awaiting MVR/CABG.  Paroxysmal atrial fibrillation  Remains in sinus rhythm.  Continue metoprolol  Eliquis held from morning of 8/11 through day of surgery 8/13.  Resumed reduced dose 2.5 mg twice daily day 1 postop on 8/14 to be increased to 5 mg twice daily in 48 hours postop as per orthopedic recommendations.  Hyperlipidemia  Continue Crestor  Acute kidney injury complicating stage III CKD  Recent Baseline creatinine may be in the 1.3-1.4 range.  Presented with creatinine of 1.62, may be related to lisinopril, diuretic use and cardiogenic.  Lisinopril held.    Creatinine has steadily increased from 1.59 > 1.69 > 1.95 >2.5, likely related to IV/p.o. diuresis and hemodynamics, unable to clearly see if patient did become hypotensive in the intra-/peri-op.  Due to rising creatinine, 8/14 morning dose of IV Lasix was held & received 8/14 evening dose IV Lasix 80 mg.  This morning creatinine has further worsened from 1.9 > 2.45.  Discontinued IV Lasix and Aldactone for now, await cardiology follow-up today.  Creatinine has stabilized, 2.5 today.  Continue to hold diuretics and follow BMP in a.m. and hopeful for improvement.  As per discussion with patient and son, patient follows with nephrologist at [facility] and advised to do so upon discharge, especially prior to her CABG/MVR.  Essential hypertension  Reasonably controlled.  Continue metoprolol 50 mg twice daily.  Holding home lisinopril due to acute kidney injury and ongoing aggressive IV diuresis.  Hypothyroid  Continue Synthroid 112 mcg daily.  History of CVA, type not known  Continue rosuvastatin.  Not on aspirin due to being on Eliquis.  Obesity/Body mass index is 29.23 kg/m.  Nausea and Vomiting -Resolved.   Continue PPI and PRN Zofran.  Postop acute blood loss anemia  Mild.  Continue to follow CBCs daily.  Transfuse if hemoglobin 7 g per DL or less.  Expected hemoglobin drop, 10.8 today.  Continue to follow.  DVT prophylaxis: SCDs Code Status: Full Family Communication: I discussed in detail with patient's son via phone, updated care and answered questions. Disposition: DC home with home health pending clinical improvement, probably in the next 48 to 72 hours.   Consultants:  Cardiology Orthopedics  Procedures:  Left total hip arthroplasty, anterior approach on 8/13.  Closed management of pelvic ring fracture.  Antimicrobials:  None   Subjective: Patient denies complaints.  No chest pain, dyspnea or pain reported.  Has been mobilizing well post surgery.  Objective:  Vitals:   08/10/19 2010 08/11/19 0452 08/11/19 0810 08/11/19 1122  BP: 104/67 104/71 (!) 202/65 (!) 170/58  Pulse: 64 60 62 (!) 59  Resp: 16 16  17   Temp: 97.7 F (36.5 C) (!) 97.4 F (36.3 C) (!) 97.4 F (36.3 C) 98.4 F (36.9 C)  TempSrc: Oral Oral    SpO2: 100% 99% 100% 95%  Weight:  72.5 kg    Height:        Examination:  General exam: Pleasant elderly female, moderately built and obese sitting up comfortably in reclining chair this morning. Respiratory system: Clear to auscultation.  No increased work of breathing.  Cardiovascular system: S1 and S2 heard, RRR.  No JVD.  3/6 pansystolic murmur best heard at apex.  Trace bilateral leg edema.  Telemetry personally reviewed: Sinus rhythm. Gastrointestinal system: Abdomen is nondistended, soft and nontender. No organomegaly or masses felt. Normal bowel sounds heard.  Stable. Central nervous system: Alert and oriented. No focal neurological deficits. Extremities: Symmetric 5 x 5 power.  Left lower extremity power unable to assess secondary to postop pain but at least grade 4 x 5 power.  Left hip postop dressing clean and dry.  Some old bruising of left calf  region from fall sustained 4 weeks ago, looks slightly darker today.  She has approximately 1 cm size right shin healing ulcer with scab and no acute findings.  Stable. Skin: No rashes, lesions or ulcers Psychiatry: Judgement and insight appear normal. Mood & affect appropriate.     Data Reviewed: I have personally reviewed following labs and imaging studies  CBC: Recent Labs  Lab 08/06/19 1807 08/07/19 0525 08/08/19 0637 08/09/19 0356 08/10/19 0355 08/11/19 0343  WBC 16.1* 11.4* 12.1* 16.2* 15.8* 10.9*  NEUTROABS 12.2*  --   --   --   --   --   HGB 14.8 14.7 13.9 12.2 11.8* 10.8*  HCT 47.0* 45.0 43.4 37.7 36.8 32.7*  MCV 100.9* 98.7 99.1 98.7 98.7 96.7  PLT 211 194 189 171 [id number]   Basic Metabolic Panel: Recent Labs  Lab 08/07/19 0525 08/08/19 0637 08/09/19 0356 08/10/19 0355 08/11/19 0343  NA 141 138 138 136 134*  K 3.7 3.7 4.3 4.3 4.0  CL 105 103 102 98 98  CO2 24 25 25 25 25   GLUCOSE 113* 110* 142* 115* 92  BUN 29* 30* 35* 51* 60*  CREATININE 1.59* 1.69* 1.96* 2.45* 2.51*  CALCIUM 9.3 8.8* 8.7* 8.8* 8.4*   Liver Function Tests: No results for input(s): AST, ALT, ALKPHOS, BILITOT, PROT, ALBUMIN in the last 168 hours.  Cardiac Enzymes: No results for input(s): CKTOTAL, CKMB, CKMBINDEX, TROPONINI in the last 168 hours.  CBG: No results for input(s): GLUCAP in the last 168 hours.  Recent Results (from the past 240 hour(s))  SARS Coronavirus 2 [facility] order, Performed in [place] hospital lab)     Status: None   Collection Time: 08/07/19  8:12 AM  Result Value Ref Range Status   SARS Coronavirus 2 NEGATIVE NEGATIVE Final    Comment: (NOTE) If result is NEGATIVE SARS-CoV-2 target nucleic acids are NOT DETECTED. The SARS-CoV-2 RNA is generally detectable in upper and lower  respiratory specimens during the acute phase of infection. The lowest  concentration of SARS-CoV-2 viral copies this assay can detect is 250  copies / mL. A negative result does not  preclude SARS-CoV-2 infection  and should not be used as the sole basis for treatment or other  patient management decisions.  A negative result may occur with  improper specimen collection / handling, submission of specimen other  than nasopharyngeal swab, presence of viral mutation(s) within the  areas targeted by this assay, and inadequate number of viral copies  (<250 copies / mL). A negative result must be combined with clinical  observations, patient history, and epidemiological information. If result is POSITIVE SARS-CoV-2 target nucleic acids are DETECTED. The SARS-CoV-2 RNA is generally detectable in upper and lower  respiratory specimens dur ing the acute phase of infection.  Positive  results are indicative of active infection with SARS-CoV-2.  Clinical  correlation with patient history and other diagnostic information is  necessary to determine patient infection status.  Positive results do  not rule out bacterial infection or co-infection with other viruses. If result is PRESUMPTIVE POSTIVE SARS-CoV-2 nucleic acids MAY BE PRESENT.   A presumptive positive result was obtained on the submitted specimen  and confirmed on repeat testing.  While 2019 novel coronavirus  (SARS-CoV-2) nucleic acids may be present in the submitted sample  additional confirmatory testing may be necessary for epidemiological  and / or clinical management purposes  to differentiate between  SARS-CoV-2 and other Sarbecovirus currently known to infect humans.  If clinically indicated additional testing with an alternate test  methodology [phone]) is advised. The SARS-CoV-2 RNA is generally  detectable in upper and lower respiratory sp ecimens during the acute  phase of infection. The expected result is Negative. Fact Sheet for Patients:  StrictlyIdeas.no Fact Sheet for Healthcare Providers: BankingDealers.co.za This test is not yet approved or cleared by  the [place] FDA and has been authorized for detection and/or diagnosis of SARS-CoV-2 by FDA under an Emergency Use Authorization (EUA).  This EUA will remain in effect (meaning this test can be used) for the duration of the COVID-19 declaration under Section 564(b)(1) of the Act, 21 U.S.C. section 360bbb-3(b)(1), unless the authorization is terminated or revoked sooner. Performed at [facility], [place] [address], [place], [place] [address]   Surgical pcr screen     Status: None   Collection Time: 08/07/19  8:55 PM   Specimen: Nasal Mucosa; Nasal Swab  Result Value Ref Range Status   MRSA, PCR NEGATIVE NEGATIVE Final   Staphylococcus aureus NEGATIVE NEGATIVE Final    Comment: (NOTE) The Xpert SA Assay (FDA approved for NASAL specimens in patients 21 years of age and older), is one component of a comprehensive surveillance program. It is not intended to diagnose infection nor to guide or monitor treatment. Performed at [facility], [place] [address], [place], [place] [address]          Radiology Studies: No results found.      Scheduled Meds:  apixaban  2.5 mg Oral BID   docusate sodium  100 mg Oral BID   levothyroxine  112 mcg Oral QAC breakfast   metoprolol tartrate  50 mg Oral BID   pantoprazole  40 mg Oral Daily   rosuvastatin  20 mg Oral Daily   Continuous Infusions:    LOS: 5 days     Vernell Leep, MD, FACP, [facility]. [facility]  To contact the attending provider between 7A-7P or the covering provider during after hours 7P-7A, please log into the web site [email or web address] and access using universal [place] password for that web site. If you do not have the password, please call the hospital operator.  08/11/2019, 12:55 PM

## 2019-08-11 NOTE — Discharge Instructions (Addendum)
° °Dr. Brian Swinteck °Joint Replacement Specialist °[facility] °[address] °[place],  [address] °[phone] ° ° °TOTAL HIP REPLACEMENT POSTOPERATIVE DIRECTIONS ° ° ° °Hip Rehabilitation, Guidelines Following Surgery  ° °WEIGHT BEARING °Weight bearing as tolerated with assist device (walker, cane, etc) as directed, use it as long as suggested by your surgeon or therapist, typically at least 4-6 weeks. ° °The results of a hip operation are greatly improved after range of motion and muscle strengthening exercises. Follow all safety measures which are given to protect your hip. If any of these exercises cause increased pain or swelling in your joint, decrease the amount until you are comfortable again. Then slowly increase the exercises. Call your caregiver if you have problems or questions.  ° °HOME CARE INSTRUCTIONS  °Most of the following instructions are designed to prevent the dislocation of your new hip.  °Remove items at home which could result in a fall. This includes throw rugs or furniture in walking pathways.  °Continue medications as instructed at time of discharge. °· You may have some home medications which will be placed on hold until you complete the course of blood thinner medication. °· You may start showering once you are discharged home. Do not remove your dressing. °Do not put on socks or shoes without following the instructions of your caregivers.   °Sit on chairs with arms. Use the chair arms to help push yourself up when arising.  °Arrange for the use of a toilet seat elevator so you are not sitting low.  °· Walk with walker as instructed.  °You may resume a sexual relationship in one month or when given the OK by your caregiver.  °Use walker as long as suggested by your caregivers.  °You may put full weight on your legs and walk as much as is comfortable. °Avoid periods of inactivity such as sitting longer than an hour when not asleep. This helps prevent  blood clots.  °You may return to work once you are cleared by your surgeon.  °Do not drive a car for 6 weeks or until released by your surgeon.  °Do not drive while taking narcotics.  °Wear elastic stockings for two weeks following surgery during the day but you may remove then at night.  °Make sure you keep all of your appointments after your operation with all of your doctors and caregivers. You should call the office at the above phone number and make an appointment for approximately two weeks after the date of your surgery. °Please pick up a stool softener and laxative for home use as long as you are requiring pain medications. °· ICE to the affected hip every three hours for 30 minutes at a time and then as needed for pain and swelling. Continue to use ice on the hip for pain and swelling from surgery. You may notice swelling that will progress down to the foot and ankle.  This is normal after surgery.  Elevate the leg when you are not up walking on it.   °It is important for you to complete the blood thinner medication as prescribed by your doctor. °· Continue to use the breathing machine which will help keep your temperature down.  It is common for your temperature to cycle up and down following surgery, especially at night when you are not up moving around and exerting yourself.  The breathing machine keeps your lungs expanded and your temperature down. ° °RANGE OF MOTION AND STRENGTHENING EXERCISES  °These exercises   are designed to help you keep full movement of your hip joint. Follow your caregiver's or physical therapist's instructions. Perform all exercises about fifteen times, three times per day or as directed. Exercise both hips, even if you have had only one joint replacement. These exercises can be done on a training (exercise) mat, on the floor, on a table or on a bed. Use whatever works the best and is most comfortable for you. Use music or television while you are exercising so that the exercises  are a pleasant break in your day. This will make your life better with the exercises acting as a break in routine you can look forward to.  Lying on your back, slowly slide your foot toward your buttocks, raising your knee up off the floor. Then slowly slide your foot back down until your leg is straight again.  Lying on your back spread your legs as far apart as you can without causing discomfort.  Lying on your side, raise your upper leg and foot straight up from the floor as far as is comfortable. Slowly lower the leg and repeat.  Lying on your back, tighten up the muscle in the front of your thigh (quadriceps muscles). You can do this by keeping your leg straight and trying to raise your heel off the floor. This helps strengthen the largest muscle supporting your knee.  Lying on your back, tighten up the muscles of your buttocks both with the legs straight and with the knee bent at a comfortable angle while keeping your heel on the floor.   SKILLED REHAB INSTRUCTIONS: If the patient is transferred to a skilled rehab facility following release from the hospital, a list of the current medications will be sent to the facility for the patient to continue.  When discharged from the skilled rehab facility, please have the facility set up the patient's [place] prior to being released. Also, the skilled facility will be responsible for providing the patient with their medications at time of release from the facility to include their pain medication and their blood thinner medication. If the patient is still at the rehab facility at time of the two week follow up appointment, the skilled rehab facility will also need to assist the patient in arranging follow up appointment in our office and any transportation needs.  MAKE SURE YOU:  Understand these instructions.  Will watch your condition.  Will get help right away if you are not doing well or get worse.  Pick up stool softner and  laxative for home use following surgery while on pain medications. Do not remove your dressing. The dressing is waterproof--it is OK to take showers. Continue to use ice for pain and swelling after surgery. Do not use any lotions or creams on the incision until instructed by your surgeon. Total Hip Protocol.     Information on my medicine - ELIQUIS (apixaban)  Why was Eliquis prescribed for you? Eliquis was prescribed for you to reduce the risk of a blood clot forming that can cause a stroke if you have a medical condition called atrial fibrillation (a type of irregular heartbeat).  What do You need to know about Eliquis ? Take your Eliquis TWICE DAILY - one tablet in the morning and one tablet in the evening with or without food. If you have difficulty swallowing the tablet whole please discuss with your pharmacist how to take the medication safely.  Take Eliquis exactly as prescribed by your doctor and DO  NOT stop taking Eliquis without talking to the doctor who prescribed the medication.  Stopping may increase your risk of developing a stroke.  Refill your prescription before you run out.  After discharge, you should have regular check-up appointments with your healthcare provider that is prescribing your Eliquis.  In the future your dose may need to be changed if your kidney function or weight changes by a significant amount or as you get older.  What do you do if you miss a dose? If you miss a dose, take it as soon as you remember on the same day and resume taking twice daily.  Do not take more than one dose of ELIQUIS at the same time to make up a missed dose.  Important Safety Information A possible side effect of Eliquis is bleeding. You should call your healthcare provider right away if you experience any of the following: ? Bleeding from an injury or your nose that does not stop. ? Unusual colored urine (red or dark brown) or unusual colored stools (red or  black). ? Unusual bruising for unknown reasons. ? A serious fall or if you hit your head (even if there is no bleeding).  Some medicines may interact with Eliquis and might increase your risk of bleeding or clotting while on Eliquis. To help avoid this, consult your healthcare provider or pharmacist prior to using any new prescription or non-prescription medications, including herbals, vitamins, non-steroidal anti-inflammatory drugs (NSAIDs) and supplements.  This website has more information on Eliquis (apixaban): http://[email or web address].com/eliquis/home   Additional discharge instructions  Please get your medications reviewed and adjusted by your Primary MD.  Please request your Primary MD to go over all Hospital Tests and Procedure/Radiological results at the follow up, please get all Hospital records sent to your Prim MD by signing hospital release before you go home.  If you had Pneumonia of Lung problems at the Hospital: Please get a 2 view Chest X ray done in 6-8 weeks after hospital discharge or sooner if instructed by your Primary MD.  If you have Congestive Heart Failure: Please call your Cardiologist or Primary MD anytime you have any of the following symptoms:  1) 3 pound weight gain in 24 hours or 5 pounds in 1 week  2) shortness of breath, with or without a dry hacking cough  3) swelling in the hands, feet or stomach  4) if you have to sleep on extra pillows at night in order to breathe  Follow cardiac low salt diet and 1.5 lit/day fluid restriction.  If you have diabetes Accuchecks 4 times/day, Once in AM empty stomach and then before each meal. Log in all results and show them to your primary doctor at your next visit. If any glucose reading is under 80 or above 300 call your primary MD immediately.  If you have Seizure/Convulsions/Epilepsy: Please do not drive, operate heavy machinery, participate in activities at heights or participate in high speed sports until you  have seen by Primary MD or a Neurologist and advised to do so again.  If you had Gastrointestinal Bleeding: Please ask your Primary MD to check a complete blood count within one week of discharge or at your next visit. Your endoscopic/colonoscopic biopsies that are pending at the time of discharge, will also need to followed by your Primary MD.  Get Medicines reviewed and adjusted. Please take all your medications with you for your next visit with your Primary MD  Please request your Primary MD to go over all  hospital tests and procedure/radiological results at the follow up, please ask your Primary MD to get all Hospital records sent to his/her office.  If you experience worsening of your admission symptoms, develop shortness of breath, life threatening emergency, suicidal or homicidal thoughts you must seek medical attention immediately by calling 911 or calling your MD immediately  if symptoms less severe.  You must read complete instructions/literature along with all the possible adverse reactions/side effects for all the Medicines you take and that have been prescribed to you. Take any new Medicines after you have completely understood and accpet all the possible adverse reactions/side effects.   Do not drive or operate heavy machinery when taking Pain medications.   Do not take more than prescribed Pain, Sleep and Anxiety Medications  Special Instructions: If you have smoked or chewed Tobacco  in the last 2 yrs please stop smoking, stop any regular Alcohol  and or any Recreational drug use.  Wear Seat belts while driving.  Please note You were cared for by a hospitalist during your hospital stay. If you have any questions about your discharge medications or the care you received while you were in the hospital after you are discharged, you can call the unit and asked to speak with the hospitalist on call if the hospitalist that took care of you is not available. Once you are discharged,  your primary care physician will handle any further medical issues. Please note that NO REFILLS for any discharge medications will be authorized once you are discharged, as it is imperative that you return to your primary care physician (or establish a relationship with a primary care physician if you do not have one) for your aftercare needs so that they can reassess your need for medications and monitor your lab values.  You can reach the hospitalist office at phone [phone] or fax [phone]   If you do not have a primary care physician, you can call [phone] for a physician referral.

## 2019-08-12 LAB — CBC
HCT: 34.7 % — ABNORMAL LOW (ref 36.0–46.0)
Hemoglobin: 11.2 g/dL — ABNORMAL LOW (ref 12.0–15.0)
MCH: 31.4 pg (ref 26.0–34.0)
MCHC: 32.3 g/dL (ref 30.0–36.0)
MCV: 97.2 fL (ref 80.0–100.0)
Platelets: 250 10*3/uL (ref 150–400)
RBC: 3.57 MIL/uL — ABNORMAL LOW (ref 3.87–5.11)
RDW: 16.4 % — ABNORMAL HIGH (ref 11.5–15.5)
WBC: 10.6 10*3/uL — ABNORMAL HIGH (ref 4.0–10.5)
nRBC: 0 % (ref 0.0–0.2)

## 2019-08-12 LAB — BASIC METABOLIC PANEL
Anion gap: 10 (ref 5–15)
BUN: 51 mg/dL — ABNORMAL HIGH (ref 8–23)
CO2: 26 mmol/L (ref 22–32)
Calcium: 8.7 mg/dL — ABNORMAL LOW (ref 8.9–10.3)
Chloride: 101 mmol/L (ref 98–111)
Creatinine, Ser: 1.78 mg/dL — ABNORMAL HIGH (ref 0.44–1.00)
GFR calc Af Amer: 31 mL/min — ABNORMAL LOW (ref >60)
GFR calc non Af Amer: 27 mL/min — ABNORMAL LOW (ref >60)
Glucose, Bld: 86 mg/dL (ref 70–99)
Potassium: 4 mmol/L (ref 3.5–5.1)
Sodium: 137 mmol/L (ref 135–145)

## 2019-08-12 MED ORDER — SPIRONOLACTONE 12.5 MG HALF TABLET
12.5000 mg | ORAL_TABLET | Freq: Every day | ORAL | Status: DC
  Administered 2019-08-12: 12:00:00 12.5 mg via ORAL
  Filled 2019-08-12: qty 1

## 2019-08-12 MED ORDER — HYDROCODONE-ACETAMINOPHEN 5-325 MG PO TABS: 1.0000 | ORAL_TABLET | Freq: Three times a day (TID) | ORAL | 0 refills | Status: DC | PRN

## 2019-08-12 MED ORDER — FUROSEMIDE 40 MG PO TABS
60.0000 mg | ORAL_TABLET | Freq: Two times a day (BID) | ORAL | Status: DC
  Administered 2019-08-12: 60 mg via ORAL
  Filled 2019-08-12: qty 1

## 2019-08-12 MED ORDER — DOCUSATE SODIUM 100 MG PO CAPS: 100.0000 mg | ORAL_CAPSULE | Freq: Two times a day (BID) | ORAL | 0 refills | Status: AC

## 2019-08-12 NOTE — Progress Notes (Signed)
Physical Therapy Treatment Patient Details Name: Caroline [name] MRN: [id number] DOB: 1941-10-13 Today's Date: 08/12/2019    History of Present Illness 78 y.o. female admitted on 08/06/19 after falling at the cardiologist office sustaining a L hip fx and pelvic ring fracture (pelvis might be sub acute), s/p L direct anterior THA WBAT post op on 08/08/19.  Pt with significant PMH of tricuspid regurgitation, stroke, PAF, mitral regurgitation and mitral stenosis, HTN, CAD, CKD stage IV, chronic diastolic heart failure, L TKA, and planned heart surgery next month (which will likely be postponed).      PT Comments    Pt is eager to discharge home today. Pt is limited in safe mobility by L LE pain in presence of decreased strength and endurance. Pt is currently min A for bed mobility and min guard for transfers and ambulation of 100 feet with RW. Pt educated on need to move every hour in order for her L hip not to stiffen up. D/c plans remain appropriate.     Follow Up Recommendations  Home health PT;Supervision for mobility/OOB     Equipment Recommendations  None recommended by PT    Recommendations for Other Services       Precautions / Restrictions Precautions Precautions: Fall Precaution Comments: No hip ROM precautions Restrictions Weight Bearing Restrictions: Yes LLE Weight Bearing: Weight bearing as tolerated    Mobility  Bed Mobility Overal bed mobility: Needs Assistance Bed Mobility: Supine to Sit     Supine to sit: Min assist;HOB elevated     General bed mobility comments: minA for management of L LE to floor, increased cuing to use of bedrail to assist to pulling hips to EoB, increased time and effort to bring trunk to upright, pt states "I think I'll sleep in the recliner. It will be easier."  Transfers Overall transfer level: Needs assistance Equipment used: Rolling walker (2 wheeled) Transfers: Sit to/from Stand Sit to Stand: Min guard         General transfer  comment: min guard for safety with power up and steadying with RW  Ambulation/Gait Ambulation/Gait assistance: Min guard Gait Distance (Feet): 100 Feet Assistive device: Rolling walker (2 wheeled) Gait Pattern/deviations: Step-to pattern;Antalgic;Step-through pattern Gait velocity: slow Gait velocity interpretation: <1.31 ft/sec, indicative of household ambulator General Gait Details: min guard for very slow, antalgic gait pt able to progress from step to to step through gait pattern       Balance Overall balance assessment: Needs assistance Sitting-balance support: Feet supported;No upper extremity supported Sitting balance-Leahy Scale: Good     Standing balance support: Bilateral upper extremity supported Standing balance-Leahy Scale: Fair                              Cognition Arousal/Alertness: Awake/alert Behavior During Therapy: WFL for tasks assessed/performed Overall Cognitive Status: Impaired/Different from baseline Area of Impairment: Problem solving                             Problem Solving: Requires verbal cues;Requires tactile cues;Slow processing General Comments: Pt continues to have slow reaction to verbal commands and forgets that she wanted to sit up in chair after walking      Exercises Total Joint Exercises Ankle Circles/Pumps: AROM;Both;10 reps     Pertinent Vitals/Pain Pain Assessment: 0-10 Pain Score: 8  Pain Location: L hip Pain Descriptors / Indicators: Grimacing;Guarding Pain Intervention(s): Limited activity within patient's  tolerance;Monitored during session;Premedicated before session;Repositioned     PT Goals (current goals can now be found in the care plan section) Acute Rehab PT Goals Patient Stated Goal: get better PT Goal Formulation: With patient/family Time For Goal Achievement: 08/23/19 Potential to Achieve Goals: Good Progress towards PT goals: Progressing toward goals    Frequency    Min  5X/week      PT Plan Current plan remains appropriate    AM-PAC PT "6 Clicks" Mobility   Outcome Measure  Help needed turning from your back to your side while in a flat bed without using bedrails?: A Little Help needed moving from lying on your back to sitting on the side of a flat bed without using bedrails?: A Little Help needed moving to and from a bed to a chair (including a wheelchair)?: A Little Help needed standing up from a chair using your arms (e.g., wheelchair or bedside chair)?: A Little Help needed to walk in hospital room?: A Little Help needed climbing 3-5 steps with a railing? : A Little 6 Click Score: 18    End of Session Equipment Utilized During Treatment: Gait belt Activity Tolerance: Patient limited by pain Patient left: in chair;with call bell/phone within reach;with family/visitor present Nurse Communication: Mobility status PT Visit Diagnosis: Muscle weakness (generalized) (M62.81);Difficulty in walking, not elsewhere classified (R26.2);Pain Pain - Right/Left: Left Pain - part of body: Hip     Time: [id number] PT Time Calculation (min) (ACUTE ONLY): 22 min  Charges:  $Gait Training: 8-22 mins                     Leah Skora B. Migdalia Dk PT, DPT Acute Rehabilitation Services Pager [phone] Office [phone]    [place] 08/12/2019, 11:23 AM

## 2019-08-12 NOTE — Care Management Important Message (Signed)
Important Message  Patient Details  Name: Caroline [name] MRN: [id number] Date of Birth: [birth year]   Medicare Important Message Given:  Yes     Shelda Altes 08/12/2019, 1:54 PM

## 2019-08-12 NOTE — Progress Notes (Signed)
Patient ID: Caroline [name], female   DOB: [birth year], 78 y.o.   MRN: [id number]     Advanced Heart Failure Rounding Note  PCP-Cardiologist: Ida Rogue, MD   Subjective:    Feels good this am. Ambulating room. Creatinine down 2.5 -> 1.78. Weight up 5 pounds.    Objective:   Weight Range: 74.6 kg Body mass index is 30.08 kg/m.   Vital Signs:   Temp:  [97.8 F (36.6 C)-98.4 F (36.9 C)] 98.4 F (36.9 C) (08/17 0319) Pulse Rate:  [59-70] 66 (08/17 0016) Resp:  [17-20] 20 (08/17 0319) BP: (111-184)/(58-86) 118/74 (08/17 0319) SpO2:  [95 %-100 %] 100 % (08/17 0319) Weight:  [74.6 kg] 74.6 kg (08/17 0319) Last BM Date: 08/06/19  Weight change: Filed Weights   08/10/19 0600 08/11/19 0452 08/12/19 0319  Weight: 72.6 kg 72.5 kg 74.6 kg    Intake/Output:   Intake/Output Summary (Last 24 hours) at 08/12/2019 0831 Last data filed at 08/12/2019 0730 Gross per 24 hour  Intake -  Output 950 ml  Net -950 ml      Physical Exam    General:  Well appearing. No resp difficulty HEENT: normal Neck: supple. JVP to jaw. Carotids 2+ bilat; no bruits. No lymphadenopathy or thryomegaly appreciated. Cor: PMI nondisplaced. Regular rate & rhythm. No rubs, gallops or murmurs. Lungs: clear Abdomen: soft, nontender, nondistended. No hepatosplenomegaly. No bruits or masses. Good bowel sounds. Extremities: no cyanosis, clubbing, rash, trace edema and ecchymosis on LLE Neuro: alert & orientedx3, cranial nerves grossly intact. moves all 4 extremities w/o difficulty. Affect pleasant   Telemetry   NSR 60-70s, personally reviewed.   Labs    CBC Recent Labs    08/11/19 0343 08/12/19 0502  WBC 10.9* 10.6*  HGB 10.8* 11.2*  HCT 32.7* 34.7*  MCV 96.7 97.2  PLT 201 [id number]   Basic Metabolic Panel Recent Labs    08/11/19 0343 08/12/19 0502  NA 134* 137  K 4.0 4.0  CL 98 101  CO2 25 26  GLUCOSE 92 86  BUN 60* 51*  CREATININE 2.51* 1.78*  CALCIUM 8.4* 8.7*   Liver Function Tests  No results for input(s): AST, ALT, ALKPHOS, BILITOT, PROT, ALBUMIN in the last 72 hours. No results for input(s): LIPASE, AMYLASE in the last 72 hours. Cardiac Enzymes No results for input(s): CKTOTAL, CKMB, CKMBINDEX, TROPONINI in the last 72 hours.  BNP: BNP (last 3 results) Recent Labs    07/30/19 1208  BNP 2,543.3*    ProBNP (last 3 results) No results for input(s): PROBNP in the last 8760 hours.   D-Dimer No results for input(s): DDIMER in the last 72 hours. Hemoglobin A1C No results for input(s): HGBA1C in the last 72 hours. Fasting Lipid Panel No results for input(s): CHOL, HDL, LDLCALC, TRIG, CHOLHDL, LDLDIRECT in the last 72 hours. Thyroid Function Tests No results for input(s): TSH, T4TOTAL, T3FREE, THYROIDAB in the last 72 hours.  Invalid input(s): FREET3  Other results:   Imaging    No results found.   Medications:     Scheduled Medications: . apixaban  5 mg Oral BID  . docusate sodium  100 mg Oral BID  . levothyroxine  112 mcg Oral QAC breakfast  . metoprolol tartrate  50 mg Oral BID  . pantoprazole  40 mg Oral Daily  . rosuvastatin  20 mg Oral Daily    Infusions:   PRN Medications: acetaminophen, HYDROcodone-acetaminophen, HYDROcodone-acetaminophen, menthol-cetylpyridinium **OR** phenol, metoCLOPramide **OR** metoCLOPramide (REGLAN) injection, morphine injection, ondansetron **OR** ondansetron (  ZOFRAN) IV, senna-docusate   Assessment/Plan   1. Acute on chronic diastolic CHF: Echo in [id number] showed EF 60-65%, mild LVH, normal-appearing RV with PASP 60 mmHg, mild-moderate mitral stenosis. 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  RHC in 8/20 showed elevated right and left heart filling pressures with mixed pulmonary venous/pulmonary arterial hypertension. She has been diuresed with IV Lasix.. Creatinne bumped post-op due to diuresis vs ATN from transient hypotension - Creatinine now improved.  Volume status going back up.  - Restart home diuretics today (lasix 60 bid) and can go home from our standpoint  - Continue spironolactone 12.5 daily.  - I think we can cancel PYP scan for now as I expect her CHF is related to mitral regurgitation and CAD.  2. Pulmonary hypertension: Mixed pulmonary venous/pulmonary arterial hypertension on 8/20 RHC.  Suspect PAH component may be due to pulmonary vascular changes in the setting of severe MR over time.  - Now diuresed. Can consider sildenafil prior to d/c or as outpatient.  3. 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  She would not be a candidate for Mitraclip or mitral valve repair based on the morphology of the valve.  She has seen Dr. Roxy Manns with [facility], would need CABG-MV replacement. This will be a fairly high risk procedure given active CHF, pulmonary hypertension, deconditioning and recent hip fracture.  Prior to surgery, she would need better control of CHF as well as physical therapy.  4. Atrial fibrillation: Paroxysmal.  She is in NSR today.  - Eliquis restarted    - Continue metoprolol.  5. PAD: No claudication but legs fatigue easily. Recent ABIs were moderately decreased.  She will be seeing vascular surgery in [place].  6. Hyperlipidemia: Goal LDL < 70 with vascular disease, LDL was 134 when recently checked.  - Statin changed to more potent Crestor.   7. AKI on CKD stage 3.  Baseline creatinine around 1.5 -> 2.45 -> 2.51 -> 1.78 (suspect diuresis + possible peri-operative hypotension/ATN). . - Given creatinine trend suspect ATN due to peri-op hypotension  - As above. Restart home lasix 8. CAD: Severe 3 vessel disease on 8/20 cath.  No chest pain.  - Ideally, would have CABG with MV replacement.  - Continue statin as above.  - She is on Eliquis so not getting aspirin. 9. S/p left THR after mechanical fall.  Post-op management per surgery, will need  PT and improved mobility to get her to the point where she would be ready for her cardiac surgery.    Restart home diuretics. [place] for d/c from our standpoint.   Length of Stay: [place], MD  08/12/2019, 8:31 AM  Advanced Heart Failure Team Pager [phone] (M-F; 7a - 4p)  Please contact [facility] for night-coverage after hours (4p -7a ) and weekends on [email or web address]

## 2019-08-12 NOTE — TOC Transition Note (Signed)
Transition of Care [facility]) - CM/SW Discharge Note   Patient Details  Name: Caroline [name] MRN: [id number] Date of Birth: 12/08/1941  Transition of Care [facility]) CM/SW Contact:  Bartholomew Crews, RN Phone Number: [phone] 08/12/2019, 2:20 PM   Clinical Narrative:    Spoke with patient via phone. PTA home with spouse. States son is supportive as well. Discussed recommendations for [facility] PT and OT. Patient stated that she had used [facility] about 2 years ago when she had a knee replacement. Referral accepted by [facility] - a clinician will call this evening to set up an appointment. Patient notified. No further transition of care needs identified at this time.    Final next level of care: [place] Barriers to Discharge: No Barriers Identified   Patient Goals and CMS Choice Patient states their goals for this hospitalization and ongoing recovery are:: return home CMS Medicare.gov Compare Post Acute Care list provided to:: Patient Choice offered to / list presented to : Patient  Discharge Placement                       Discharge Plan and Services                DME Arranged: N/A DME Agency: NA       HH Arranged: PT, OT HH Agency: [facility] Date [place]: 08/12/19 Time [place]: 1419 Representative spoke with at [place]: Cassie  Social Determinants of Health ([place]) Interventions     Readmission Risk Interventions No flowsheet data found.

## 2019-08-12 NOTE — Discharge Summary (Signed)
Physician Discharge Summary  DORINDA AYOUB [id number] DOB: Jul 17, 1941  PCP: Remi Haggard, FNP  Admitted from: Home Discharged to: Home  Admit date: 08/06/2019 Discharge date: 08/12/2019  Recommendations for Outpatient Follow-up:   Follow-up Information    Swinteck, Aaron Edelman, MD. Schedule an appointment as soon as possible for a visit in 2 week(s).   Specialty: Orthopedic Surgery Why: For wound re-check Contact information: [address] [place] [address] [place] [address] [id number]        Larey Dresser, MD Follow up on 08/27/2019.   Specialty: Cardiology Why: at 1000.Gar[address] Code [phone].  To be seen with repeat labs (CBC & BMP). Contact information: [place] [address] [phone]        Clarice Pole, MD. Schedule an appointment as soon as possible for a visit in 2 week(s).   Specialty: Nephrology Contact information: [place]. [place] [address] [phone]        Remi Haggard, FNP. Schedule an appointment as soon as possible for a visit.   Specialty: Family Medicine Contact information: [place] [address] [phone]        Minna Merritts, MD .   Specialty: Cardiology Contact information: [place] [address] [phone]        [facility] Follow up.   Specialty: Home Health Services Why: A clinician will call you tonight to schedule visits for PT and OT Contact information: [address]  [place] [phone]            Home Health: PT and OT Equipment/Devices: None  Discharge Condition: Improved and stable CODE STATUS: Full Diet recommendation: Heart healthy diet.  Discharge Diagnoses:  Principal Problem:   Fracture of femoral neck, left, closed (HCC) Active Problems:   HTN (hypertension)   Paroxysmal atrial fibrillation (HCC)   Hypothyroidism, unspecified   Acute on chronic  diastolic (congestive) heart failure (HCC)   Pulmonary hypertension (HCC)   Mitral regurgitation and mitral stenosis   CKD (chronic kidney disease) stage 3, GFR 30-59 ml/min (HCC)   History of CVA (cerebrovascular accident)   AKI (acute kidney injury) ([place])   Brief Summary: 78 year old female, PMH of chronic diastolic CHF, paroxysmal A. fib on Eliquis, CVA, HTN, HLD, hypothyroid, CKD 3, recent RHC/LHC 8/20 when diagnosed with severe mixed pulmonary venous/artery artery hypertension, low cardiac output and severe three-vessel coronary artery disease, seen [facility] for MVR/CABG, was at OP cardiologist office for follow-up on day of admission when she tripped while getting out of her wheelchair, fell on her left hip and admitted to the hospital for left femoral neck fracture.  Cardiology and orthopedics consulted.  S/p left THA 8/13.  Course complicated by progressive acute on chronic kidney disease, likely related to aggressive diuresis.   Assessment & Plan:  Left femoral neck fracture, left pubic rami fracture and left sacral ala fracture, s/p left THA anterior approach 8/13  Sustained status post mechanical fall  Suspect underlying osteoporosis contributing.  Thereby patient will need to undergo bone density study within the next 4 weeks or start a bisphosphonate.  Orthopedic/Dr. Lyla Glassing consulted and after preop cardiac optimization and clearance (moderate to high risk) underwent uneventful left THA 8/13.  Eliquis was on hold preop, now gradually resumed and back to her home dose.  Nonoperative management for pubic rami and sacral fractures.  As per orthopedics, WBAT on left lower extremity.  Minimal use of pain  medications.  Therapies recommend home health PT and OT.  I discussed with Dr. Delfino Lovett who cleared her for discharge home with close outpatient follow-up with him in 2 weeks.  Reviewed [facility] controlled substance database base, patient last received 15 tablets of 5/325  hydrocodone on 07/02/2019.  Prescribed a short course of as needed opioid pain medications for moderate to severe pain.  Acute on chronic diastolic CHF  TTE [id number]: LVEF 60-65%, mild to moderate mitral stenosis.  TEE 8/20: Normal EF, rheumatic appearing mitral valve and severe MR/mild MS  RHC 8/20: Findings consistent with mixed pulmonary venous/pulmonary artery hypertension  Cardiology following.  Myeloma panel was negative.  Eventually will need cardiac amyloidosis and PYP scan deferred for now.  Preop patient was diuresed with IV Lasix 80 mg twice daily, Aldactone 12.5 mg daily and received a dose of metolazone on day of surgery.  Intake output and weight recordings probably not fully accurate.  Postop patient developed acute kidney injury and creatinine peaked to two 2.51, likely related to aggressive diuresis and ATN from perioperative hemodynamic changes.  Diuretics were held for a couple days.  Creatinine improved to 1.7.  As discussed with Cardiology, starting to develop volume overload as evidenced by 5 pound weight gain and positive JVD. Patient resumed on prior home dose of diuretics as noted below.  Cardiology will arrange close outpatient follow-up with repeat labs.  Clinically improved.  Acute respiratory failure with hypoxia  Likely related to decompensated CHF.  Resolved.  Severe three-vessel CAD  Diagnosed by cardiac cath 8/20.  As indicated above, eventual plans for mitral valve replacement and CABG.  Continue metoprolol and rosuvastatin.  Eliquis resumed day 1 postop on 8/14.  Mixed pulmonary venous/pulmonary artery hypertension  Noted on recent [place] 8/20  Cardiology considering sildenafil use after diuresis.  Mitral valve disease  TEE 8/20 showed rheumatic appearing valve with severe mitral valve regurgitation and mild mitral stenosis.  Not a candidate for mitral clip or mitral valve repair based on valve morphology.  She has seen Dr. Roxy Manns with [facility]  and awaiting MVR/CABG.  Paroxysmal atrial fibrillation  Remains in sinus rhythm.  Continue metoprolol  Eliquis held from morning of 8/11 through day of surgery 8/13.  Resumed reduced dose 2.5 mg twice daily day 1 postop on 8/14 and back to prior home dose of 5 mg twice daily from 8/17.  Hyperlipidemia  Continue Crestor  Acute kidney injury complicating stage III CKD  Recent Baseline creatinine may be in the 1.3-1.4 range.  Presented with creatinine of 1.62, may be related to lisinopril, diuretic use and cardiogenic.  Creatinine has steadily increased from 1.59 > 1.69 > 1.95 >2.5, likely related to IV/p.o. diuresis and hemodynamics, unable to clearly see if patient did become hypotensive in the intra-/peri-op.  Patient's diuretics were held for a couple of days.  Creatinine has nicely improved to 1.7.  Prior home dose of diuretics were resumed.  Close follow-up outpatient with repeat BMP.  As per discussion with patient and son, patient follows with nephrologist at [facility] and advised to do so upon discharge, especially prior to her CABG/MVR.  Lisinopril was held in the hospital and discontinued at discharge until renal function continues to remain stable as outpatient.  Essential hypertension  Reasonably controlled.  Continue metoprolol 50 mg twice daily.  Lisinopril discontinued for now.  Hypothyroid  Continue Synthroid 112 mcg daily.  History of CVA, type not known  Continue rosuvastatin.  Not on aspirin due to being on Eliquis.  Obesity/Body mass index is 29.23 kg/m.  Nausea and Vomiting -Resolved.    Briefly on PPI in the hospital.  Postop acute blood loss anemia  Mild.  Hemoglobin stable.  Follow CBC as outpatient.   Consultants:  Cardiology Orthopedics  Procedures:  Lefttotal hip arthroplasty, anterior approach on 8/13.  Closed management of pelvic ring fracture.    Discharge Instructions  Discharge Instructions    (HEART FAILURE  PATIENTS) Call MD:  Anytime you have any of the following symptoms: 1) 3 pound weight gain in 24 hours or 5 pounds in 1 week 2) shortness of breath, with or without a dry hacking cough 3) swelling in the hands, feet or stomach 4) if you have to sleep on extra pillows at night in order to breathe.   Complete by: As directed    Call MD for:  difficulty breathing, headache or visual disturbances   Complete by: As directed    Call MD for:  extreme fatigue   Complete by: As directed    Call MD for:  persistant dizziness or light-headedness   Complete by: As directed    Call MD for:  persistant nausea and vomiting   Complete by: As directed    Call MD for:  redness, tenderness, or signs of infection (pain, swelling, redness, odor or green/yellow discharge around incision site)   Complete by: As directed    Call MD for:  severe uncontrolled pain   Complete by: As directed    Call MD for:  temperature >100.4   Complete by: As directed    Diet - low sodium heart healthy   Complete by: As directed    Increase activity slowly   Complete by: As directed        Medication List    STOP taking these medications   lisinopril 40 MG tablet Commonly known as: ZESTRIL     TAKE these medications   acetaminophen 500 MG tablet Commonly known as: TYLENOL Take 1,000 mg by mouth daily as needed (for pain.).   allopurinol 100 MG tablet Commonly known as: ZYLOPRIM Take 100 mg by mouth daily.   docusate sodium 100 MG capsule Commonly known as: COLACE Take 1 capsule (100 mg total) by mouth 2 (two) times daily.   Eliquis 5 MG Tabs tablet Generic drug: apixaban Take 1 tablet by mouth twice daily   furosemide 40 MG tablet Commonly known as: Lasix Take 1.5 tablets (60 mg total) by mouth 2 (two) times daily.   HYDROcodone-acetaminophen 5-325 MG tablet Commonly known as: NORCO/VICODIN Take 1-2 tablets by mouth every 8 (eight) hours as needed for moderate pain or severe pain.   levothyroxine 112  MCG tablet Commonly known as: SYNTHROID Take 112 mcg by mouth daily before breakfast.   metoprolol tartrate 50 MG tablet Commonly known as: LOPRESSOR Take 1 tablet (50 mg total) by mouth 2 (two) times daily.   rosuvastatin 20 MG tablet Commonly known as: CRESTOR Take 1 tablet (20 mg total) by mouth daily.   spironolactone 25 MG tablet Commonly known as: ALDACTONE Take 0.5 tablets (12.5 mg total) by mouth daily.      No Known Allergies    Procedures/Studies: Dg Chest 1 View  Result Date: 08/06/2019 CLINICAL DATA:  Fall, left hip pain. History hypertension, CAD, CHF, mitral valve disease EXAM: CHEST  1 VIEW COMPARISON:  Most recent radiograph 12/20/2018, CT chest 12/11/2017 FINDINGS: Cardiomegaly with a calcified tortuous aorta similar to prior studies. No consolidation, features of edema, pneumothorax, or effusion. Pulmonary  vascularity is normally distributed. No acute osseous or soft tissue abnormality. Degenerative changes are present in the imaged spine and shoulders. Vascular calcium at the carotid bifurcation on the left. IMPRESSION: No acute cardiopulmonary abnormality or acute traumatic finding in the chest Electronically Signed   By: Lovena Le M.D.   On: 08/06/2019 16:44   Ct Head Wo Contrast  Result Date: 08/06/2019 CLINICAL DATA:  Recent fall with headaches, initial encounter EXAM: CT HEAD WITHOUT CONTRAST TECHNIQUE: Contiguous axial images were obtained from the base of the skull through the vertex without intravenous contrast. COMPARISON:  06/06/2017 FINDINGS: Brain: Mild chronic white matter ischemic changes noted. No findings to suggest acute hemorrhage, acute infarction or space-occupying mass lesion. Vascular: No hyperdense vessel or unexpected calcification. Skull: Normal. Negative for fracture or focal lesion. Sinuses/Orbits: No acute finding. Other: Mild scalp hematoma in the left frontal region consistent with the recent injury. IMPRESSION: Scalp hematoma without  acute intracranial abnormality. Electronically Signed   By: Inez Catalina M.D.   On: 08/06/2019 17:41   Ct Pelvis Wo Contrast  Result Date: 08/06/2019 CLINICAL DATA:  Fall.  Evaluate for fracture. EXAM: CT PELVIS WITHOUT CONTRAST TECHNIQUE: Multidetector CT imaging of the pelvis was performed following the standard protocol without intravenous contrast. COMPARISON:  Left hip x-rays from same day. FINDINGS: Urinary Tract:  No abnormality visualized. Bowel:  Sigmoid diverticulosis. Vascular/Lymphatic: Aortoiliac atherosclerosis. No pathologically enlarged lymph nodes. Reproductive:  Prior hysterectomy.  No adnexal mass. Other:  None. Musculoskeletal: Acute fracture of the left femoral neck with foreshortening and apex anterior angulation. Subacute, healing fractures of the left superior and inferior pubic rami with surrounding callus formation. Linear sclerosis in the left sacral ala. No dislocation. IMPRESSION: 1. Acute left femoral neck fracture with foreshortening. 2. Subacute fractures of the left superior and inferior pubic rami as well as the left sacral ala. Electronically Signed   By: Titus Dubin M.D.   On: 08/06/2019 20:34   Pelvis Portable  Result Date: 08/08/2019 CLINICAL DATA:  Postop left hip EXAM: PORTABLE PELVIS 1-2 VIEWS COMPARISON:  08/08/2019, 08/06/2019 FINDINGS: Status post left hip replacement with normal alignment. Gas in the soft tissues consistent with recent surgery. Subacute fractures left superior and inferior pubic rami. Vascular calcification IMPRESSION: 1. Left hip replacement with expected operative change 2. Subacute left superior and inferior pubic rami fractures Electronically Signed   By: Donavan Foil M.D.   On: 08/08/2019 22:51   Dg Knee Left Port  Result Date: 08/07/2019 CLINICAL DATA:  Preop left femoral neck fracture, prior left knee replacement EXAM: PORTABLE LEFT KNEE - 1-2 VIEW COMPARISON:  None. FINDINGS: Left knee arthroplasty, without evidence of  complication. No fracture or dislocation is seen. Visualized soft tissues are within normal limits. No suprapatellar knee joint effusion. IMPRESSION: Left knee arthroplasty, without evidence of complication. No fracture or dislocation is seen. Electronically Signed   By: Julian Hy M.D.   On: 08/07/2019 19:11   Dg C-arm 1-60 Min  Result Date: 08/08/2019 CLINICAL DATA:  Hip fracture EXAM: DG C-ARM 61-120 MIN; OPERATIVE LEFT HIP WITH PELVIS COMPARISON:  08/06/2019 FINDINGS: Five low resolution intraoperative spot views of the left hip. Total fluoroscopy time was 16 seconds. The images demonstrate a left hip replacement with normal alignment. There are acute left superior and inferior pubic rami fractures. IMPRESSION: 1. Status post left hip replacement with normal alignment 2. Fractures of the left inferior and superior pubic rami Electronically Signed   By: Donavan Foil M.D.   On:  08/08/2019 21:08    Dg Hip Operative Unilat W Or W/o Pelvis Left  Result Date: 08/08/2019 CLINICAL DATA:  Hip fracture EXAM: DG C-ARM 61-120 MIN; OPERATIVE LEFT HIP WITH PELVIS COMPARISON:  08/06/2019 FINDINGS: Five low resolution intraoperative spot views of the left hip. Total fluoroscopy time was 16 seconds. The images demonstrate a left hip replacement with normal alignment. There are acute left superior and inferior pubic rami fractures. IMPRESSION: 1. Status post left hip replacement with normal alignment 2. Fractures of the left inferior and superior pubic rami Electronically Signed   By: Donavan Foil M.D.   On: 08/08/2019 21:08   Dg Hip Unilat With Pelvis 2-3 Views Left  Result Date: 08/06/2019 CLINICAL DATA:  Fall, unable to move left leg EXAM: DG HIP (WITH OR WITHOUT PELVIS) 2-3V LEFT COMPARISON:  None. FINDINGS: Foreshortened transcervical left femoral neck fracture. Additional fractures of left superior and inferior pubic rami as well as disruption of the left sacral arcuate lines suggesting a left sacral  fracture as well with hazy periosteal reaction suggesting possible callus formation. No other fracture or traumatic malalignment is seen. The lower sacrum is poorly visualized due to overlying bowel gas the SI joints and symphysis pubis remain congruent. Degenerative changes are present in both hips and within the lower lumbar spine. Vascular calcium is seen in the pelvis. IMPRESSION: 1. Foreshortened transcervical left femoral neck fracture. 2. Left superior and inferior pubic rami fractures and probable left sacral fracture. More age indeterminate given suggestion of callus formation. 3. Diffuse bone demineralization. Electronically Signed   By: Lovena Le M.D.   On: 08/06/2019 16:42      Subjective: Patient was interviewed and examined along with Dr. Haroldine Laws.  Patient reports feeling well.  Has been mobilizing with PT.  Denies complaints.  No dyspnea or chest pain reported.  Discharge Exam:  Vitals:   08/11/19 2122 08/12/19 0016 08/12/19 0319 08/12/19 0929  BP: 111/86 (!) 184/75 118/74 138/67  Pulse: 70 66    Resp: 20  20   Temp:  97.8 F (36.6 C) 98.4 F (36.9 C)   TempSrc:  Oral Oral   SpO2: 100% 96% 100%   Weight:   74.6 kg   Height:        General exam: Pleasant elderly female, moderately built and obese sitting up comfortably in reclining chair this morning. Respiratory system: Clear to auscultation.  No increased work of breathing. Cardiovascular system: S1 and S2 heard, RRR. JVD +.  3/6 pansystolic murmur best heard at apex.  Trace bilateral leg edema.  Telemetry personally reviewed: Sinus rhythm. Gastrointestinal system: Abdomen is nondistended, soft and nontender. No organomegaly or masses felt. Normal bowel sounds heard.  Stable. Central nervous system: Alert and oriented. No focal neurological deficits. Extremities: Symmetric 5 x 5 power.  Left lower extremity power unable to assess secondary to postop pain but at least grade 4 x 5 power.  Left hip postop dressing clean  and dry.  Some old bruising of left calf region from fall sustained 4 weeks ago, looks slightly darker but no change compared to yesterday.  She has approximately 1 cm size right shin healing ulcer with scab and no acute findings.  Stable. Skin: No rashes, lesions or ulcers Psychiatry: Judgement and insight appear normal. Mood & affect appropriate.     The results of significant diagnostics from this hospitalization (including imaging, microbiology, ancillary and laboratory) are listed below for reference.     Microbiology: Recent Results (from the past 240  hour(s))  SARS Coronavirus 2 [facility] order, Performed in [facility] hospital lab)     Status: None   Collection Time: 08/07/19  8:12 AM  Result Value Ref Range Status   SARS Coronavirus 2 NEGATIVE NEGATIVE Final    Comment: (NOTE) If result is NEGATIVE SARS-CoV-2 target nucleic acids are NOT DETECTED. The SARS-CoV-2 RNA is generally detectable in upper and lower  respiratory specimens during the acute phase of infection. The lowest  concentration of SARS-CoV-2 viral copies this assay can detect is 250  copies / mL. A negative result does not preclude SARS-CoV-2 infection  and should not be used as the sole basis for treatment or other  patient management decisions.  A negative result may occur with  improper specimen collection / handling, submission of specimen other  than nasopharyngeal swab, presence of viral mutation(s) within the  areas targeted by this assay, and inadequate number of viral copies  (<250 copies / mL). A negative result must be combined with clinical  observations, patient history, and epidemiological information. If result is POSITIVE SARS-CoV-2 target nucleic acids are DETECTED. The SARS-CoV-2 RNA is generally detectable in upper and lower  respiratory specimens dur ing the acute phase of infection.  Positive  results are indicative of active infection with SARS-CoV-2.  Clinical  correlation with  patient history and other diagnostic information is  necessary to determine patient infection status.  Positive results do  not rule out bacterial infection or co-infection with other viruses. If result is PRESUMPTIVE POSTIVE SARS-CoV-2 nucleic acids MAY BE PRESENT.   A presumptive positive result was obtained on the submitted specimen  and confirmed on repeat testing.  While 2019 novel coronavirus  (SARS-CoV-2) nucleic acids may be present in the submitted sample  additional confirmatory testing may be necessary for epidemiological  and / or clinical management purposes  to differentiate between  SARS-CoV-2 and other Sarbecovirus currently known to infect humans.  If clinically indicated additional testing with an alternate test  methodology [phone]) is advised. The SARS-CoV-2 RNA is generally  detectable in upper and lower respiratory sp ecimens during the acute  phase of infection. The expected result is Negative. Fact Sheet for Patients:  StrictlyIdeas.no Fact Sheet for Healthcare Providers: BankingDealers.co.za This test is not yet approved or cleared by the [place] FDA and has been authorized for detection and/or diagnosis of SARS-CoV-2 by FDA under an Emergency Use Authorization (EUA).  This EUA will remain in effect (meaning this test can be used) for the duration of the COVID-19 declaration under Section 564(b)(1) of the Act, 21 U.S.C. section 360bbb-3(b)(1), unless the authorization is terminated or revoked sooner. Performed at [facility], [place] [address], [place], [place] [address]   Surgical pcr screen     Status: None   Collection Time: 08/07/19  8:55 PM   Specimen: Nasal Mucosa; Nasal Swab  Result Value Ref Range Status   MRSA, PCR NEGATIVE NEGATIVE Final   Staphylococcus aureus NEGATIVE NEGATIVE Final    Comment: (NOTE) The Xpert SA Assay (FDA approved for NASAL specimens in patients 69 years of age and  older), is one component of a comprehensive surveillance program. It is not intended to diagnose infection nor to guide or monitor treatment. Performed at [facility], [place] [address] [place], [place] [address]      Labs: CBC: Recent Labs  Lab 08/06/19 1807  08/08/19 [id number] 08/09/19 0356 08/10/19 0355 08/11/19 0343 08/12/19 0502  WBC 16.1*   < > 12.1*  16.2* 15.8* 10.9* 10.6*  NEUTROABS 12.2*  --   --   --   --   --   --   HGB 14.8   < > 13.9 12.2 11.8* 10.8* 11.2*  HCT 47.0*   < > 43.4 37.7 36.8 32.7* 34.7*  MCV 100.9*   < > 99.1 98.7 98.7 96.7 97.2  PLT 211   < > 189 171 223 201 250   < > = values in this interval not displayed.   Basic Metabolic Panel: Recent Labs  Lab 08/08/19 0637 08/09/19 0356 08/10/19 0355 08/11/19 0343 08/12/19 0502  NA 138 138 136 134* 137  K 3.7 4.3 4.3 4.0 4.0  CL 103 102 98 98 101  CO2 25 25 25 25 26   GLUCOSE 110* 142* 115* 92 86  BUN 30* 35* 51* 60* 51*  CREATININE 1.69* 1.96* 2.45* 2.51* 1.78*  CALCIUM 8.8* 8.7* 8.8* 8.4* 8.7*   Liver Function Tests: No results for input(s): AST, ALT, ALKPHOS, BILITOT, PROT, ALBUMIN in the last 168 hours. BNP (last 3 results) Recent Labs    07/30/19 1208  BNP 2,543.3*   While I was at the bedside, Dr. Haroldine Laws discussed with patient's son using her phone, updated care and answered all questions.   Time coordinating discharge: 45 minutes  SIGNED:  Vernell Leep, MD, FACP, [facility]. [facility]  To contact the attending provider between 7A-7P or the covering provider during after hours 7P-7A, please log into the web site [email or web address] and access using universal [place] password for that web site. If you do not have the password, please call the hospital operator.

## 2019-08-13 ENCOUNTER — Other Ambulatory Visit (HOSPITAL_COMMUNITY): Payer: Medicare Other

## 2019-08-20 ENCOUNTER — Encounter (HOSPITAL_COMMUNITY): Payer: Medicare Other | Admitting: Cardiology

## 2019-08-26 ENCOUNTER — Encounter: Payer: Medicare Other | Admitting: Thoracic Surgery (Cardiothoracic Vascular Surgery)

## 2019-08-27 ENCOUNTER — Ambulatory Visit (HOSPITAL_COMMUNITY)
Admit: 2019-08-27 | Discharge: 2019-08-27 | Disposition: A | Discharge status: Home / Self Care | Payer: Medicare Other | Source: Ambulatory Visit | Attending: Cardiology | Admitting: Cardiology

## 2019-08-27 ENCOUNTER — Encounter (HOSPITAL_COMMUNITY): Payer: Self-pay | Admitting: Cardiology

## 2019-08-27 ENCOUNTER — Other Ambulatory Visit: Payer: Self-pay

## 2019-08-27 ENCOUNTER — Telehealth (HOSPITAL_COMMUNITY): Payer: Self-pay | Admitting: Cardiology

## 2019-08-27 VITALS — BP 182/100 | HR 68 | Wt 152.0 lb

## 2019-08-27 DIAGNOSIS — I872 Venous insufficiency (chronic) (peripheral): Secondary | ICD-10-CM | POA: Diagnosis not present

## 2019-08-27 DIAGNOSIS — E039 Hypothyroidism, unspecified: Secondary | ICD-10-CM | POA: Insufficient documentation

## 2019-08-27 DIAGNOSIS — I272 Pulmonary hypertension, unspecified: Secondary | ICD-10-CM | POA: Diagnosis not present

## 2019-08-27 DIAGNOSIS — I2721 Secondary pulmonary arterial hypertension: Secondary | ICD-10-CM | POA: Insufficient documentation

## 2019-08-27 DIAGNOSIS — I5032 Chronic diastolic (congestive) heart failure: Secondary | ICD-10-CM | POA: Insufficient documentation

## 2019-08-27 DIAGNOSIS — N179 Acute kidney failure, unspecified: Secondary | ICD-10-CM

## 2019-08-27 DIAGNOSIS — I059 Rheumatic mitral valve disease, unspecified: Secondary | ICD-10-CM | POA: Diagnosis not present

## 2019-08-27 DIAGNOSIS — I2510 Atherosclerotic heart disease of native coronary artery without angina pectoris: Secondary | ICD-10-CM | POA: Insufficient documentation

## 2019-08-27 DIAGNOSIS — Z79899 Other long term (current) drug therapy: Secondary | ICD-10-CM | POA: Diagnosis not present

## 2019-08-27 DIAGNOSIS — N183 Chronic kidney disease, stage 3 (moderate): Secondary | ICD-10-CM | POA: Insufficient documentation

## 2019-08-27 DIAGNOSIS — Z8249 Family history of ischemic heart disease and other diseases of the circulatory system: Secondary | ICD-10-CM | POA: Insufficient documentation

## 2019-08-27 DIAGNOSIS — Z8673 Personal history of transient ischemic attack (TIA), and cerebral infarction without residual deficits: Secondary | ICD-10-CM | POA: Insufficient documentation

## 2019-08-27 DIAGNOSIS — I480 Paroxysmal atrial fibrillation: Secondary | ICD-10-CM | POA: Diagnosis not present

## 2019-08-27 DIAGNOSIS — Z7901 Long term (current) use of anticoagulants: Secondary | ICD-10-CM | POA: Diagnosis not present

## 2019-08-27 DIAGNOSIS — I130 Hypertensive heart and chronic kidney disease with heart failure and stage 1 through stage 4 chronic kidney disease, or unspecified chronic kidney disease: Secondary | ICD-10-CM | POA: Diagnosis not present

## 2019-08-27 DIAGNOSIS — R5383 Other fatigue: Secondary | ICD-10-CM | POA: Insufficient documentation

## 2019-08-27 DIAGNOSIS — I052 Rheumatic mitral stenosis with insufficiency: Secondary | ICD-10-CM | POA: Insufficient documentation

## 2019-08-27 DIAGNOSIS — I340 Nonrheumatic mitral (valve) insufficiency: Secondary | ICD-10-CM

## 2019-08-27 DIAGNOSIS — E785 Hyperlipidemia, unspecified: Secondary | ICD-10-CM | POA: Insufficient documentation

## 2019-08-27 LAB — URINALYSIS, ROUTINE W REFLEX MICROSCOPIC
Bilirubin Urine: NEGATIVE
Glucose, UA: NEGATIVE mg/dL
Hgb urine dipstick: NEGATIVE
Ketones, ur: NEGATIVE mg/dL
Leukocytes,Ua: NEGATIVE
Nitrite: NEGATIVE
Protein, ur: 30 mg/dL — AB
Specific Gravity, Urine: 1.014 (ref 1.005–1.030)
pH: 5 (ref 5.0–8.0)

## 2019-08-27 LAB — BASIC METABOLIC PANEL
Anion gap: 17 — ABNORMAL HIGH (ref 5–15)
BUN: 44 mg/dL — ABNORMAL HIGH (ref 8–23)
CO2: 22 mmol/L (ref 22–32)
Calcium: 9.9 mg/dL (ref 8.9–10.3)
Chloride: 101 mmol/L (ref 98–111)
Creatinine, Ser: 1.6 mg/dL — ABNORMAL HIGH (ref 0.44–1.00)
GFR calc Af Amer: 35 mL/min — ABNORMAL LOW (ref >60)
GFR calc non Af Amer: 31 mL/min — ABNORMAL LOW (ref >60)
Glucose, Bld: 97 mg/dL (ref 70–99)
Potassium: 4.7 mmol/L (ref 3.5–5.1)
Sodium: 140 mmol/L (ref 135–145)

## 2019-08-27 MED ORDER — AMLODIPINE BESYLATE 5 MG PO TABS: 5.0000 mg | ORAL_TABLET | Freq: Every day | ORAL | 6 refills | Status: AC

## 2019-08-27 MED ORDER — SILDENAFIL CITRATE 20 MG PO TABS: 20.0000 mg | ORAL_TABLET | Freq: Three times a day (TID) | ORAL | 3 refills | Status: AC

## 2019-08-27 NOTE — Patient Instructions (Signed)
Start Amlodipine 5 mg daily  Start Sildenafil 20 mg Three times a day   Labs and urine analysis done today  Your physician recommends that you schedule a follow-up appointment in: 3 weeks  Dr Guy Sandifer office will call you to move up your appointment with him  At the [facility], you and your health needs are our priority. As part of our continuing mission to provide you with exceptional heart care, we have created designated Provider Care Teams. These Care Teams include your primary Cardiologist (physician) and Advanced Practice Providers (APPs- Physician Assistants and Nurse Practitioners) who all work together to provide you with the care you need, when you need it.   You may see any of the following providers on your designated Care Team at your next follow up: [place] Kitchen Dr Glori Bickers . Dr Loralie Champagne . Darrick Grinder, NP   Please be sure to bring in all your medications bottles to every appointment.

## 2019-08-27 NOTE — Telephone Encounter (Signed)
As requested labs forwarded to [facility] nephrology Dr [name]  Fax # [phone]

## 2019-08-27 NOTE — Progress Notes (Signed)
PCP: Remi Haggard, FNP Cardiology: Dr. [name] HF Cardiology: Loralie Champagne   78 y.o. with history of chronic diastolic CHF, mitral valve disease, paroxysmal atrial fibrillation, CKD stage 3, and PAD was referred by Dr. [name] for evaluation of CHF.  Patient reports exertional dyspnea since 2018 (after left TKR complicated by respiratory failure).  Dyspnea has been gradually worsening over time, when I initially saw her, she was short of breath walking 100-150 feet and with most moderate exertion.  No orthopnea/PND.  No chest pain. Echo in 1/20 showed normal LV EF with mild LVH, normal RV with moderate pulmonary hypertension (PASP 60 mmHg), and mild to moderate mitral stenosis.  No known history of rheumatic fever. She has paroxysmal atrial fibrillation but is generally in NSR. She has home oxygen which she only uses prn.  She never smoked. Noted to have moderate PAD by ABIs.    She had a TEE in 8/20, showing EF 55-60% with moderate TR and rheumatic-appearing mitral valve with severe mitral regurgitation and mild mitral stenosis.  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  She has seen Dr. Roxy Manns for evaluation for MVR/CABG. I have increased her diuretics.   Unfortunately, she had a mechanical fall with left femoral neck fracture.  She had surgical repair in 8/20. She had peri-operative transient hypotension and AKI that resolved.  Lisinopril was stopped.   She returns today for followup of CHF, CAD, and mitral valve disease.  She has done surprisingly well since her surgery. She is getting home PT twice a week.  She is walking with a walker now and getting stronger gradually.  She can walk about 200 feet without dyspnea.  No chest pain.  Breathing better overall with increased Lasix, weight is down 12 lbs since initial appointment. BP is quite high today  (still off lisinopril).   Labs (9/19): K 3.8, creatinine 1.73 Labs (8/20): LDL 131, BNP 2543, myeloma panel negative, K 4.8, creatinine 1.66 => 1.78, hgb 11.2  ECG (personally reviewed): NSR, LVH, septal Qs  PMH: 1. PAD: ABIs (7/20) 0.53 on right and 0.47 on left.  2. Chronic diastolic CHF: Echo (123XX123) with EF 60-65%, mild LVH, normal RV size and systolic function, PA systolic pressure 60 mmHg, mild MR, mild-moderate mitral stenosis (mean gradient 6.5 mmHg), moderate LAE.  - RHC (8/20): mean RA 13, PA 78/26 mean 48, mean PCWP 26 with v waves to 47, CI 2.0, PVR 6.3 WU.  3. Atrial fibrillation: Paroxysmal.  S/p CVA.  4. HTN 5. Chronic venous insufficiency.  6. Hypothyroidism.  7. Hyperlipidemia 8. Bilateral carotid artery disease: Followed by vascular surgeon in [place].  9. CKD: Stage 3.  10. CAD: Cardiac cath in 2005 with minimal CAD.  - LHC (8/20): 75% mid LAD, severe disease in a large branching ramus, 90% mid LCx, totally occluded PLV branch of RCA with collaterals.  11. Left TKR in 2018.  12. 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  Mild to moderate mitral stenosis on echo in 1/20. - TEE (8/20): EF 55-60% with moderate TR and rheumatic-appearing mitral valve with severe mitral regurgitation and mild mitral stenosis. 13. Left femoral neck fracture 8/20: Mechanical fall, s/p repair.   Social History   Socioeconomic History  . Marital status: Married    Spouse name: Not on file  . Number of children: Not on file  . Years of education: Not  on file  . Highest education level: Not on file  Occupational History  . Occupation: retired  Scientific laboratory technician  . Financial resource strain: Not on file  . Food insecurity    Worry: Not on file    Inability: Not on file  . Transportation needs    Medical: Not on file    Non-medical: Not on file  Tobacco Use  . Smoking status: Never Smoker  . Smokeless tobacco: Never Used  Substance and Sexual Activity  . Alcohol use: Yes     Alcohol/week: 1.0 standard drinks    Types: 1 Glasses of wine per week    Comment: social   . Drug use: No  . Sexual activity: Not on file  Lifestyle  . Physical activity    Days per week: Not on file    Minutes per session: Not on file  . Stress: Not on file  Relationships  . Social Herbalist on phone: Not on file    Gets together: Not on file    Attends religious service: Not on file    Active member of club or organization: Not on file    Attends meetings of clubs or organizations: Not on file    Relationship status: Not on file  . Intimate partner violence    Fear of current or ex partner: Not on file    Emotionally abused: Not on file    Physically abused: Not on file    Forced sexual activity: Not on file  Other Topics Concern  . Not on file  Social History Narrative  . Not on file   Family History  Problem Relation Age of Onset  . Heart attack Father 37  . Hypertension Father   . Hyperlipidemia Father   . Heart disease Brother        valvular disease    ROS: All systems reviewed and negative except as per HPI.   Current Outpatient Medications  Medication Sig Dispense Refill  . acetaminophen (TYLENOL) 500 MG tablet Take 1,000 mg by mouth daily as needed (for pain.).    Marland Kitchen allopurinol (ZYLOPRIM) 100 MG tablet Take 100 mg by mouth daily.     Marland Kitchen docusate sodium (COLACE) 100 MG capsule Take 1 capsule (100 mg total) by mouth 2 (two) times daily. 10 capsule 0  . ELIQUIS 5 MG TABS tablet Take 1 tablet by mouth twice daily 60 tablet 5  . furosemide (LASIX) 40 MG tablet Take 1.5 tablets (60 mg total) by mouth 2 (two) times daily. 270 tablet 3  . levothyroxine (SYNTHROID, LEVOTHROID) 112 MCG tablet Take 112 mcg by mouth daily before breakfast.    . metoprolol tartrate (LOPRESSOR) 50 MG tablet Take 1 tablet (50 mg total) by mouth 2 (two) times daily. 180 tablet 3  . rosuvastatin (CRESTOR) 20 MG tablet Take 1 tablet (20 mg total) by mouth daily. 90 tablet 3  .  spironolactone (ALDACTONE) 25 MG tablet Take 0.5 tablets (12.5 mg total) by mouth daily. 45 tablet 3  . amLODipine (NORVASC) 5 MG tablet Take 1 tablet (5 mg total) by mouth daily. 30 tablet 6  . sildenafil (REVATIO) 20 MG tablet Take 1 tablet (20 mg total) by mouth 3 (three) times daily. 90 tablet 3   No current facility-administered medications for this encounter.    Facility-Administered Medications Ordered in Other Encounters  Medication Dose Route Frequency Provider Last Rate Last Dose  . albuterol (PROVENTIL) (2.5 MG/3ML) 0.083% nebulizer solution 2.5 mg  2.5 mg Nebulization Once Laverle Hobby, MD       BP (!) 182/100   Pulse 68   Wt 68.9 kg (152 lb)   SpO2 96%   BMI 27.80 kg/m  General: NAD Neck: No JVD, no thyromegaly or thyroid nodule.  Lungs: Clear to auscultation bilaterally with normal respiratory effort. CV: Nondisplaced PMI.  Heart regular S1/S2, no S3/S4, 3/6 HSM apex.  No peripheral edema.  No carotid bruit.  Normal pedal pulses.  Abdomen: Soft, nontender, no hepatosplenomegaly, no distention.  Skin: Intact without lesions or rashes.  Neurologic: Alert and oriented x 3.  Psych: Normal affect. Extremities: No clubbing or cyanosis.  HEENT: Normal.   Assessment/Plan: 1. Chronic diastolic CHF: Echo in 123XX123 showed EF 60-65%, mild LVH, normal-appearing RV with PASP 60 mmHg, mild-moderate mitral stenosis. 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  RHC in 8/20 showed elevated right and left heart filling pressures with mixed pulmonary venous/pulmonary arterial hypertension.  Volume status looks considerably better and weight is down.   - Continue Lasix 60 mg bid.  BMET today.  - I decided to hold off on PYP scan, can explain CHF and dyspnea by severe MR and CAD.  2. Pulmonary hypertension: Mixed pulmonary venous/pulmonary arterial hypertension on 8/20 RHC.  Suspect PAH component may be due to pulmonary vascular changes in  the setting of severe MR over time.  - She now appears well-diuresed.  I will start sildenafil 20 mg tid to deal with suspected pulmonary vascular disease component of pulmonary hypertension.   3. 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  She would not be a candidate for Mitraclip or mitral valve repair based on the morphology of the valve.  She has seen Dr. Roxy Manns with [facility], would need CABG-MV replacement. This will be a fairly high risk procedure given CHF, pulmonary hypertension, and deconditioning.  Her CHF now appears under good control.  However, she recently had a mechanical fall with left femoral neck fracture and repair.  She is working with PT and getting more mobile.  - I am going to try to get her back in with Dr. Roxy Manns in 1 month.  If she continues to make progress at the current rate, she should be reasonably mobile at that time.  4. Atrial fibrillation: Paroxysmal.  She is in NSR today.  - Continue Eliquis - Continue metoprolol.  5. PAD: No claudication but legs fatigue easily. Recent ABIs were moderately decreased.  She will be seeing vascular surgery in [place].  6. Hyperlipidemia: Goal LDL < 70 with vascular disease, LDL was 134 when recently checked.  - She is now on Crestor, will get lipids/LFTs at next appt.  7. CKD: Stage 3.  BMET today.  8. CAD: Severe 3 vessel disease on 8/20 cath.  No chest pain.  - She will need CABG with MV replacement.  - Continue statin as above.  - She is on Eliquis so not getting aspirin.  Followup with me in 3 wks.   Loralie Champagne 08/27/2019

## 2019-09-09 ENCOUNTER — Encounter: Payer: Medicare Other | Admitting: Thoracic Surgery (Cardiothoracic Vascular Surgery)

## 2019-09-17 ENCOUNTER — Ambulatory Visit (HOSPITAL_COMMUNITY)
Admission: RE | Admit: 2019-09-17 | Discharge: 2019-09-17 | Disposition: A | Discharge status: Home / Self Care | Payer: Medicare Other | Source: Ambulatory Visit | Attending: Cardiology | Admitting: Cardiology

## 2019-09-17 ENCOUNTER — Other Ambulatory Visit: Payer: Self-pay

## 2019-09-17 ENCOUNTER — Encounter (HOSPITAL_COMMUNITY): Payer: Self-pay | Admitting: Cardiology

## 2019-09-17 VITALS — BP 118/63 | HR 73 | Wt 155.2 lb

## 2019-09-17 DIAGNOSIS — I059 Rheumatic mitral valve disease, unspecified: Secondary | ICD-10-CM | POA: Insufficient documentation

## 2019-09-17 DIAGNOSIS — N183 Chronic kidney disease, stage 3 (moderate): Secondary | ICD-10-CM | POA: Insufficient documentation

## 2019-09-17 DIAGNOSIS — Z96651 Presence of right artificial knee joint: Secondary | ICD-10-CM | POA: Insufficient documentation

## 2019-09-17 DIAGNOSIS — R9431 Abnormal electrocardiogram [ECG] [EKG]: Secondary | ICD-10-CM | POA: Insufficient documentation

## 2019-09-17 DIAGNOSIS — I872 Venous insufficiency (chronic) (peripheral): Secondary | ICD-10-CM | POA: Insufficient documentation

## 2019-09-17 DIAGNOSIS — Z8673 Personal history of transient ischemic attack (TIA), and cerebral infarction without residual deficits: Secondary | ICD-10-CM | POA: Insufficient documentation

## 2019-09-17 DIAGNOSIS — I739 Peripheral vascular disease, unspecified: Secondary | ICD-10-CM | POA: Insufficient documentation

## 2019-09-17 DIAGNOSIS — Z8249 Family history of ischemic heart disease and other diseases of the circulatory system: Secondary | ICD-10-CM | POA: Diagnosis not present

## 2019-09-17 DIAGNOSIS — I340 Nonrheumatic mitral (valve) insufficiency: Secondary | ICD-10-CM

## 2019-09-17 DIAGNOSIS — E039 Hypothyroidism, unspecified: Secondary | ICD-10-CM | POA: Insufficient documentation

## 2019-09-17 DIAGNOSIS — I5032 Chronic diastolic (congestive) heart failure: Secondary | ICD-10-CM | POA: Diagnosis not present

## 2019-09-17 DIAGNOSIS — I052 Rheumatic mitral stenosis with insufficiency: Secondary | ICD-10-CM | POA: Diagnosis not present

## 2019-09-17 DIAGNOSIS — I2721 Secondary pulmonary arterial hypertension: Secondary | ICD-10-CM | POA: Insufficient documentation

## 2019-09-17 DIAGNOSIS — I480 Paroxysmal atrial fibrillation: Secondary | ICD-10-CM | POA: Insufficient documentation

## 2019-09-17 DIAGNOSIS — I5022 Chronic systolic (congestive) heart failure: Secondary | ICD-10-CM

## 2019-09-17 DIAGNOSIS — Z79899 Other long term (current) drug therapy: Secondary | ICD-10-CM | POA: Diagnosis not present

## 2019-09-17 DIAGNOSIS — I130 Hypertensive heart and chronic kidney disease with heart failure and stage 1 through stage 4 chronic kidney disease, or unspecified chronic kidney disease: Secondary | ICD-10-CM | POA: Insufficient documentation

## 2019-09-17 DIAGNOSIS — I2510 Atherosclerotic heart disease of native coronary artery without angina pectoris: Secondary | ICD-10-CM | POA: Diagnosis not present

## 2019-09-17 DIAGNOSIS — E785 Hyperlipidemia, unspecified: Secondary | ICD-10-CM | POA: Insufficient documentation

## 2019-09-17 DIAGNOSIS — Z7901 Long term (current) use of anticoagulants: Secondary | ICD-10-CM | POA: Insufficient documentation

## 2019-09-17 DIAGNOSIS — I272 Pulmonary hypertension, unspecified: Secondary | ICD-10-CM | POA: Insufficient documentation

## 2019-09-17 LAB — LIPID PANEL
Cholesterol: 178 mg/dL (ref 0–200)
HDL: 50 mg/dL (ref >40)
LDL Cholesterol: 84 mg/dL (ref 0–99)
Total CHOL/HDL Ratio: 3.6 RATIO
Triglycerides: 221 mg/dL — ABNORMAL HIGH (ref <150)
VLDL: 44 mg/dL — ABNORMAL HIGH (ref 0–40)

## 2019-09-17 LAB — CBC
HCT: 44.2 % (ref 36.0–46.0)
Hemoglobin: 14.1 g/dL (ref 12.0–15.0)
MCH: 32.2 pg (ref 26.0–34.0)
MCHC: 31.9 g/dL (ref 30.0–36.0)
MCV: 100.9 fL — ABNORMAL HIGH (ref 80.0–100.0)
Platelets: 307 10*3/uL (ref 150–400)
RBC: 4.38 MIL/uL (ref 3.87–5.11)
RDW: 15.7 % — ABNORMAL HIGH (ref 11.5–15.5)
WBC: 14 10*3/uL — ABNORMAL HIGH (ref 4.0–10.5)
nRBC: 0 % (ref 0.0–0.2)

## 2019-09-17 LAB — BASIC METABOLIC PANEL
Anion gap: 15 (ref 5–15)
BUN: 32 mg/dL — ABNORMAL HIGH (ref 8–23)
CO2: 23 mmol/L (ref 22–32)
Calcium: 9.9 mg/dL (ref 8.9–10.3)
Chloride: 101 mmol/L (ref 98–111)
Creatinine, Ser: 1.46 mg/dL — ABNORMAL HIGH (ref 0.44–1.00)
GFR calc Af Amer: 40 mL/min — ABNORMAL LOW (ref >60)
GFR calc non Af Amer: 34 mL/min — ABNORMAL LOW (ref >60)
Glucose, Bld: 110 mg/dL — ABNORMAL HIGH (ref 70–99)
Potassium: 3.9 mmol/L (ref 3.5–5.1)
Sodium: 139 mmol/L (ref 135–145)

## 2019-09-17 NOTE — Patient Instructions (Signed)
Labs done today. We will contact you only if your labs are abnormal.  No medication changes were made. Please continue all current medications as prescribed.   Your physician recommends that you schedule a follow-up appointment in: 2 months.  At the [facility], you and your health needs are our priority. As part of our continuing mission to provide you with exceptional heart care, we have created designated Provider Care Teams. These Care Teams include your primary Cardiologist (physician) and Advanced Practice Providers (APPs- Physician Assistants and Nurse Practitioners) who all work together to provide you with the care you need, when you need it.   You may see any of the following providers on your designated Care Team at your next follow up: Marland Kitchen Dr Glori Bickers . Dr Loralie Champagne . Darrick Grinder, NP   Please be sure to bring in all your medications bottles to every appointment.

## 2019-09-18 NOTE — Progress Notes (Signed)
PCP: Remi Haggard, FNP Cardiology: Dr. [name] HF Cardiology: Loralie Champagne   78 y.o. with history of chronic diastolic CHF, mitral valve disease, paroxysmal atrial fibrillation, CKD stage 3, and PAD was referred by Dr. [name] for evaluation of CHF.  Patient reports exertional dyspnea since 2018 (after left TKR complicated by respiratory failure).  Dyspnea has been gradually worsening over time, when I initially saw her, she was short of breath walking 100-150 feet and with most moderate exertion.  No orthopnea/PND.  No chest pain. Echo in 1/20 showed normal LV EF with mild LVH, normal RV with moderate pulmonary hypertension (PASP 60 mmHg), and mild to moderate mitral stenosis.  No known history of rheumatic fever. She has paroxysmal atrial fibrillation but is generally in NSR. She has home oxygen which she only uses prn.  She never smoked. Noted to have moderate PAD by ABIs.    She had a TEE in 8/20, showing EF 55-60% with moderate TR and rheumatic-appearing mitral valve with severe mitral regurgitation and mild mitral stenosis.  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  She has seen Dr. Roxy Manns for evaluation for MVR/CABG. I have increased her diuretics.   Unfortunately, she had a mechanical fall with left femoral neck fracture.  She had surgical repair in 8/20. She had peri-operative transient hypotension and AKI that resolved.  Lisinopril was stopped.   She returns today for followup of CHF, CAD, and mitral valve disease.  She has done surprisingly well since her surgery. She is getting home PT twice a week, almost finished.  She is now walking a lot on her own.  She is using her walker with longer distances but is now back to walking on her own generally.  No dyspnea unless she goes on a long walk.  Weight is stable.  No orthopnea/PND.  No chest pain.   BP is controlled.   Labs (9/19): K 3.8, creatinine 1.73 Labs (8/20): LDL 131, BNP 2543, myeloma panel negative, K 4.8, creatinine 1.66 => 1.78, hgb 11.2 Labs (9/20): K 4.7, creatinine 1.6  ECG (personally reviewed): NSR, old ASMI.   PMH: 1. PAD: ABIs (7/20) 0.53 on right and 0.47 on left.  2. Chronic diastolic CHF: Echo (123XX123) with EF 60-65%, mild LVH, normal RV size and systolic function, PA systolic pressure 60 mmHg, mild MR, mild-moderate mitral stenosis (mean gradient 6.5 mmHg), moderate LAE.  - RHC (8/20): mean RA 13, PA 78/26 mean 48, mean PCWP 26 with v waves to 47, CI 2.0, PVR 6.3 WU.  3. Atrial fibrillation: Paroxysmal.  S/p CVA.  4. HTN 5. Chronic venous insufficiency.  6. Hypothyroidism.  7. Hyperlipidemia 8. Bilateral carotid artery disease: Followed by vascular surgeon in [place].  9. CKD: Stage 3.  10. CAD: Cardiac cath in 2005 with minimal CAD.  - LHC (8/20): 75% mid LAD, severe disease in a large branching ramus, 90% mid LCx, totally occluded PLV branch of RCA with collaterals.  11. Left TKR in 2018.  12. 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  Mild to moderate mitral stenosis on echo in 1/20. - TEE (8/20): EF 55-60% with moderate TR and rheumatic-appearing mitral valve with severe mitral regurgitation and mild mitral stenosis. 13. Left femoral neck fracture 8/20: Mechanical fall, s/p repair.   Social History   Socioeconomic History  . Marital status: Married    Spouse name: Not on file  .  Number of children: Not on file  . Years of education: Not on file  . Highest education level: Not on file  Occupational History  . Occupation: retired  Scientific laboratory technician  . Financial resource strain: Not on file  . Food insecurity    Worry: Not on file    Inability: Not on file  . Transportation needs    Medical: Not on file    Non-medical: Not on file  Tobacco Use  . Smoking status: Never Smoker  . Smokeless tobacco: Never Used  Substance and Sexual Activity   . Alcohol use: Yes    Alcohol/week: 1.0 standard drinks    Types: 1 Glasses of wine per week    Comment: social   . Drug use: No  . Sexual activity: Not on file  Lifestyle  . Physical activity    Days per week: Not on file    Minutes per session: Not on file  . Stress: Not on file  Relationships  . Social Herbalist on phone: Not on file    Gets together: Not on file    Attends religious service: Not on file    Active member of club or organization: Not on file    Attends meetings of clubs or organizations: Not on file    Relationship status: Not on file  . Intimate partner violence    Fear of current or ex partner: Not on file    Emotionally abused: Not on file    Physically abused: Not on file    Forced sexual activity: Not on file  Other Topics Concern  . Not on file  Social History Narrative  . Not on file   Family History  Problem Relation Age of Onset  . Heart attack Father 34  . Hypertension Father   . Hyperlipidemia Father   . Heart disease Brother        valvular disease    ROS: All systems reviewed and negative except as per HPI.   Current Outpatient Medications  Medication Sig Dispense Refill  . acetaminophen (TYLENOL) 500 MG tablet Take 1,000 mg by mouth daily as needed (for pain.).    Marland Kitchen allopurinol (ZYLOPRIM) 100 MG tablet Take 100 mg by mouth daily.     Marland Kitchen amLODipine (NORVASC) 5 MG tablet Take 1 tablet (5 mg total) by mouth daily. 30 tablet 6  . docusate sodium (COLACE) 100 MG capsule Take 1 capsule (100 mg total) by mouth 2 (two) times daily. 10 capsule 0  . ELIQUIS 5 MG TABS tablet Take 1 tablet by mouth twice daily 60 tablet 5  . furosemide (LASIX) 40 MG tablet Take 1.5 tablets (60 mg total) by mouth 2 (two) times daily. 270 tablet 3  . levothyroxine (SYNTHROID, LEVOTHROID) 112 MCG tablet Take 112 mcg by mouth daily before breakfast.    . metoprolol tartrate (LOPRESSOR) 50 MG tablet Take 1 tablet (50 mg total) by mouth 2 (two) times daily.  180 tablet 3  . rosuvastatin (CRESTOR) 20 MG tablet Take 1 tablet (20 mg total) by mouth daily. 90 tablet 3  . sildenafil (REVATIO) 20 MG tablet Take 1 tablet (20 mg total) by mouth 3 (three) times daily. 90 tablet 3  . spironolactone (ALDACTONE) 25 MG tablet Take 0.5 tablets (12.5 mg total) by mouth daily. 45 tablet 3   No current facility-administered medications for this encounter.    Facility-Administered Medications Ordered in Other Encounters  Medication Dose Route Frequency Provider Last Rate Last Dose  .  albuterol (PROVENTIL) (2.5 MG/3ML) 0.083% nebulizer solution 2.5 mg  2.5 mg Nebulization Once Laverle Hobby, MD       BP 118/63   Pulse 73   Wt 70.4 kg (155 lb 3.2 oz)   SpO2 95%   BMI 28.39 kg/m  General: NAD Neck: No JVD, no thyromegaly or thyroid nodule.  Lungs: Clear to auscultation bilaterally with normal respiratory effort. CV: Nondisplaced PMI.  Heart regular S1/S2, no S3/S4, 3/6 HSM apex.  No peripheral edema.  No carotid bruit.  Normal pedal pulses.  Abdomen: Soft, nontender, no hepatosplenomegaly, no distention.  Skin: Intact without lesions or rashes.  Neurologic: Alert and oriented x 3.  Psych: Normal affect. Extremities: No clubbing or cyanosis.  HEENT: Normal.   Assessment/Plan: 1. Chronic diastolic CHF: Echo in 123XX123 showed EF 60-65%, mild LVH, normal-appearing RV with PASP 60 mmHg, mild-moderate mitral stenosis. 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  RHC in 8/20 showed elevated right and left heart filling pressures with mixed pulmonary venous/pulmonary arterial hypertension.  She does not look volume overloaded today.   - Continue Lasix 60 mg bid.  BMET today.  - I decided to hold off on PYP scan, can explain CHF and dyspnea by severe MR and CAD.  2. Pulmonary hypertension: Mixed pulmonary venous/pulmonary arterial hypertension on 8/20 RHC.  Suspect PAH component may be due to pulmonary vascular  changes in the setting of severe MR over time.  - Continue sildenafil 20 mg tid to deal with suspected pulmonary vascular disease component of pulmonary hypertension.   3. 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  She would not be a candidate for Mitraclip or mitral valve repair based on the morphology of the valve.  She has seen Dr. Roxy Manns with [facility], would need CABG-MV replacement. This will be a fairly high risk procedure given CHF, pulmonary hypertension, and deconditioning.  Her CHF now appears under good control.  However, she had a mechanical fall with left femoral neck fracture and repair in 8/20.  She is now doing considerably better and is quite mobile again.  - She has an appointment with Dr. Roxy Manns next week, I think she is probably near ready for cardiac surgery.  4. Atrial fibrillation: Paroxysmal.  She is in NSR today.  - Continue Eliquis - Continue metoprolol.  5. PAD: No claudication but legs fatigue easily. Recent ABIs were moderately decreased.  She will be seeing vascular surgery in [place].  6. Hyperlipidemia: Goal LDL < 70 with vascular disease, LDL was 134 when recently checked.  - She is now on Crestor, check lipids today.  7. CKD: Stage 3.  BMET today.  8. CAD: Severe 3 vessel disease on 8/20 cath.  No chest pain.  - She will need CABG with MV replacement.  - Continue statin as above.  - She is on Eliquis so not getting aspirin.  Followup with me in 2 months.    Loralie Champagne 09/18/2019

## 2019-09-30 ENCOUNTER — Other Ambulatory Visit: Payer: Self-pay

## 2019-09-30 ENCOUNTER — Encounter: Payer: Medicare Other | Admitting: Thoracic Surgery (Cardiothoracic Vascular Surgery)

## 2019-09-30 ENCOUNTER — Other Ambulatory Visit: Payer: Self-pay | Admitting: *Deleted

## 2019-09-30 ENCOUNTER — Encounter: Payer: Self-pay | Admitting: Thoracic Surgery (Cardiothoracic Vascular Surgery)

## 2019-09-30 ENCOUNTER — Ambulatory Visit (INDEPENDENT_AMBULATORY_CARE_PROVIDER_SITE_OTHER): Payer: Medicare Other | Admitting: Thoracic Surgery (Cardiothoracic Vascular Surgery)

## 2019-09-30 VITALS — BP 134/64 | HR 79 | Temp 97.5°F | Resp 16 | Ht 63.0 in | Wt 148.0 lb

## 2019-09-30 DIAGNOSIS — I2510 Atherosclerotic heart disease of native coronary artery without angina pectoris: Secondary | ICD-10-CM | POA: Diagnosis not present

## 2019-09-30 DIAGNOSIS — I071 Rheumatic tricuspid insufficiency: Secondary | ICD-10-CM

## 2019-09-30 DIAGNOSIS — I052 Rheumatic mitral stenosis with insufficiency: Secondary | ICD-10-CM

## 2019-09-30 DIAGNOSIS — I4891 Unspecified atrial fibrillation: Secondary | ICD-10-CM

## 2019-09-30 DIAGNOSIS — I480 Paroxysmal atrial fibrillation: Secondary | ICD-10-CM

## 2019-09-30 DIAGNOSIS — I340 Nonrheumatic mitral (valve) insufficiency: Secondary | ICD-10-CM | POA: Diagnosis not present

## 2019-09-30 NOTE — Patient Instructions (Signed)
Stop taking Eliquis on Thursday 10/15  Continue taking all other medications without change through the day before surgery.  Have nothing to eat or drink after midnight the night before surgery.  On the morning of surgery take only Synthroid and Metoprolol with a sip of water.

## 2019-09-30 NOTE — Progress Notes (Signed)
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  She was initially seen in consultation on August 05, 2019.  The following day she fell and broke her left hip, requiring total hip replacement.  Despite her pre-existing problems with acute on chronic diastolic congestive heart failure, reduced physical mobility, and generalized deconditioning, she has recovered remarkably well.  She has been followed carefully by Dr. Aundra Dubin in the advanced heart failure clinic and remained remarkably stable from a cardiac standpoint.  She recently completed her physical rehabilitation from her hip replacement and she has now actually getting around remarkably well.  She uses a cane for stability to prevent subsequent falls, but her strength is actually better than it was prior to her fall and hip fracture.  She is accompanied by her husband for her office consultation visit today.  She still gets short of breath with low-level activity but she has not had resting shortness of breath, PND, orthopnea, or lower extremity edema.  Her weight has been stable.  She has never had any chest pain or chest tightness.  She has not had any palpitations.  She remains chronically anticoagulated using  Eliquis.  She states that her appetite is fairly good and her strength has recovered.  She has been seen by her dentist and cleared for surgical intervention as she has no cavities or other acute problems at this time.  The remainder of her review of systems is unchanged from previously.   Current Outpatient Medications  Medication Sig Dispense Refill   allopurinol (ZYLOPRIM) 100 MG tablet Take 100 mg by mouth daily.      amLODipine (NORVASC) 5 MG tablet Take 1 tablet (5 mg total) by mouth daily. 30 tablet 6   docusate sodium (COLACE) 100 MG capsule Take 1 capsule (100 mg total) by mouth 2 (two) times daily. 10 capsule 0   ELIQUIS 5 MG TABS tablet Take 1 tablet by mouth twice daily 60 tablet 5   furosemide (LASIX) 40 MG tablet Take 1.5 tablets (60 mg total) by mouth 2 (two) times daily. 270 tablet 3   levothyroxine (SYNTHROID, LEVOTHROID) 112 MCG tablet Take 112 mcg by mouth daily before breakfast.     metoprolol tartrate (LOPRESSOR) 50 MG tablet Take 1 tablet (50 mg total) by mouth 2 (two) times daily. 180 tablet 3   rosuvastatin (CRESTOR) 20 MG tablet Take 1 tablet (20 mg total) by mouth daily. (Patient taking differently: Take 20 mg by mouth 2 (two) times daily. ) 90 tablet 3   sildenafil (REVATIO) 20 MG tablet Take 1 tablet (20 mg total) by mouth 3 (three) times daily. 90 tablet 3   spironolactone (ALDACTONE) 25 MG tablet Take 0.5 tablets (12.5 mg total) by mouth daily. 45 tablet 3   acetaminophen (TYLENOL)  500 MG tablet Take 1,000 mg by mouth daily as needed (for pain.).     No current facility-administered medications for this visit.    Facility-Administered Medications Ordered in Other Visits  Medication Dose Route Frequency Provider Last Rate Last Dose   albuterol (PROVENTIL) (2.5 MG/3ML) 0.083% nebulizer solution 2.5 mg  2.5 mg Nebulization Once Laverle Hobby, MD          Physical Exam:   BP 134/64 (BP Location: Left Arm, Patient Position: Sitting, Cuff Size:  Large)    Pulse 79    Temp (!) 97.5 F (36.4 C)    Resp 16    Ht 5\' 3"  (1.6 m)    Wt 148 lb (67.1 kg)    SpO2 96% Comment: RA   BMI 26.22 kg/m   General:  Well-appearing  Chest:   Clear to auscultation with symmetrical breath sounds  CV:   Regular rate and rhythm with systolic murmur  Incisions:  n/a  Abdomen:  Soft nontender  Extremities:  Warm and well-perfused, no edema  Diagnostic Tests:  n/a   Impression:  Patient has stage D severe symptomatic primary mitral regurgitation and severe multivessel coronary artery disease.  She presents with a long history of chronic diastolic congestive heart failure which has remained stable on current medical therapy, New York Heart Association functional class III.  I have personally reviewed the patient's transesophageal echocardiogram and diagnostic cardiac catheterization.  Patient likely has underlying rheumatic heart disease.  There is type IIIa mitral valve dysfunction with severe restricted mobility involving the posterior leaflet of the mitral valve.  Both leaflets are thickened and the posterior leaflet appears essentially immobile.  There is severe calcification of the posterior mitral annulus.  The subvalvular apparatus may be a little bit thickened and foreshortened.  There appears to be mild mitral stenosis.  I am skeptical that the patient's mitral valve could be repairable and suspect it would need to be replaced.  Left ventricular systolic function remains normal.  There is severe pulmonary hypertension with mild right ventricular chamber enlargement, mild right ventricular systolic dysfunction, and moderate tricuspid regurgitation.  Diagnostic cardiac catheterization confirmed the presence of severe pulmonary hypertension and also revealed severe multivessel coronary artery disease.  There is severe calcification of the posterior mitral annulus.  Concomitant tricuspid valve repair may be necessary.  Risks associated with conventional surgical  mitral valve replacement and coronary artery bypass grafting would unquestionably be very high.  Risks of surgery and the patient's postoperative convalescence will be further adversely affected by the patient's significant physical deconditioning and recent hip fracture.    However, the patient appears to have recovered remarkably well from her broken hip and she reports that her strength is essentially back to her baseline.  Prognosis with long-term medical therapy would be quite poor.  I do not feel the patient's mitral regurgitation could be treated using transcatheter edge-to-edge repair.  At present transcatheter mitral valve replacement is not available for commercial use, and given the patient's complex past medical history I am skeptical that the patient would be considered a candidate for enrollment in any of the ongoing trials that might be available at tertiary care centers where transcatheter mitral valve replacement is actively under investigation.     Plan:  The patient and her husband were again counseled at length regarding treatment alternatives for management of severe symptomatic mitral regurgitation and multivessel coronary artery disease.  Alternative approaches such as conventional surgical mitral valve repair or replacement with coronary artery  bypass grafting and continued medical therapy without intervention were compared and contrasted at length.  The relative lack of suitable less invasive options for treatment was explained.  The high risks associated with conventional surgery were discussed in detail, as were expectations for post-operative convalescence.  The relative risks and benefits of performing a maze procedure at the time of her surgery were discussed at length, including the expected likelihood of long term freedom from recurrent symptomatic atrial fibrillation and/or atrial flutter.  We discussed the possibility of replacing the mitral valve using a mechanical  prosthesis with the attendant need for long-term anticoagulation versus the alternative of replacing it using a bioprosthetic tissue valve with its potential for late structural valve deterioration and failure, depending upon the patient's longevity.  The patient specifically requests that if the mitral valve must be replaced that it be done using a bioprosthetic tissue valve.   All questions answered.  We tentatively plan to proceed with surgery on October 15, 2019.  The patient has been instructed to stop taking Eliquis on October 10, 2019.  She will remain on all other medications without change.  The patient will return to our office for follow-up prior to surgery on October 14, 2019.   I spent in excess of 30 minutes during the conduct of this office consultation and >50% of this time involved direct face-to-face encounter with the patient for counseling and/or coordination of their care.    Valentina Gu. Roxy Manns, MD 09/30/2019 10:12 AM

## 2019-10-01 ENCOUNTER — Encounter: Payer: Self-pay | Admitting: *Deleted

## 2019-10-11 ENCOUNTER — Encounter: Payer: Self-pay | Admitting: Thoracic Surgery (Cardiothoracic Vascular Surgery)

## 2019-10-11 ENCOUNTER — Other Ambulatory Visit
Admission: RE | Admit: 2019-10-11 | Discharge: 2019-10-11 | Disposition: A | Discharge status: Home / Self Care | Payer: Medicare Other | Source: Ambulatory Visit | Attending: Thoracic Surgery (Cardiothoracic Vascular Surgery) | Admitting: Thoracic Surgery (Cardiothoracic Vascular Surgery)

## 2019-10-11 ENCOUNTER — Other Ambulatory Visit: Payer: Self-pay

## 2019-10-11 ENCOUNTER — Ambulatory Visit (INDEPENDENT_AMBULATORY_CARE_PROVIDER_SITE_OTHER): Payer: Medicare Other | Admitting: Thoracic Surgery (Cardiothoracic Vascular Surgery)

## 2019-10-11 ENCOUNTER — Encounter (HOSPITAL_COMMUNITY): Payer: Self-pay

## 2019-10-11 ENCOUNTER — Ambulatory Visit (HOSPITAL_COMMUNITY)
Admission: RE | Admit: 2019-10-11 | Discharge: 2019-10-11 | Disposition: A | Discharge status: Home / Self Care | Payer: Medicare Other | Source: Ambulatory Visit | Attending: Thoracic Surgery (Cardiothoracic Vascular Surgery) | Admitting: Thoracic Surgery (Cardiothoracic Vascular Surgery)

## 2019-10-11 ENCOUNTER — Encounter (HOSPITAL_COMMUNITY)
Admission: RE | Admit: 2019-10-11 | Discharge: 2019-10-11 | Disposition: A | Discharge status: Home / Self Care | Payer: Medicare Other | Source: Ambulatory Visit | Attending: Thoracic Surgery (Cardiothoracic Vascular Surgery) | Admitting: Thoracic Surgery (Cardiothoracic Vascular Surgery)

## 2019-10-11 ENCOUNTER — Other Ambulatory Visit (HOSPITAL_COMMUNITY): Payer: Medicare Other

## 2019-10-11 VITALS — BP 121/72 | HR 79 | Temp 97.8°F | Resp 20 | Ht 63.0 in | Wt 148.0 lb

## 2019-10-11 DIAGNOSIS — Z8673 Personal history of transient ischemic attack (TIA), and cerebral infarction without residual deficits: Secondary | ICD-10-CM | POA: Insufficient documentation

## 2019-10-11 DIAGNOSIS — Z20828 Contact with and (suspected) exposure to other viral communicable diseases: Secondary | ICD-10-CM | POA: Diagnosis not present

## 2019-10-11 DIAGNOSIS — E039 Hypothyroidism, unspecified: Secondary | ICD-10-CM | POA: Diagnosis not present

## 2019-10-11 DIAGNOSIS — I509 Heart failure, unspecified: Secondary | ICD-10-CM | POA: Diagnosis not present

## 2019-10-11 DIAGNOSIS — Z79890 Hormone replacement therapy (postmenopausal): Secondary | ICD-10-CM | POA: Diagnosis not present

## 2019-10-11 DIAGNOSIS — I130 Hypertensive heart and chronic kidney disease with heart failure and stage 1 through stage 4 chronic kidney disease, or unspecified chronic kidney disease: Secondary | ICD-10-CM | POA: Insufficient documentation

## 2019-10-11 DIAGNOSIS — I071 Rheumatic tricuspid insufficiency: Secondary | ICD-10-CM

## 2019-10-11 DIAGNOSIS — Z01812 Encounter for preprocedural laboratory examination: Secondary | ICD-10-CM | POA: Insufficient documentation

## 2019-10-11 DIAGNOSIS — I2510 Atherosclerotic heart disease of native coronary artery without angina pectoris: Secondary | ICD-10-CM

## 2019-10-11 DIAGNOSIS — Z01818 Encounter for other preprocedural examination: Secondary | ICD-10-CM | POA: Insufficient documentation

## 2019-10-11 DIAGNOSIS — Z9981 Dependence on supplemental oxygen: Secondary | ICD-10-CM | POA: Diagnosis not present

## 2019-10-11 DIAGNOSIS — I340 Nonrheumatic mitral (valve) insufficiency: Secondary | ICD-10-CM | POA: Diagnosis not present

## 2019-10-11 DIAGNOSIS — Z7901 Long term (current) use of anticoagulants: Secondary | ICD-10-CM | POA: Insufficient documentation

## 2019-10-11 DIAGNOSIS — I480 Paroxysmal atrial fibrillation: Secondary | ICD-10-CM | POA: Insufficient documentation

## 2019-10-11 DIAGNOSIS — N183 Chronic kidney disease, stage 3 unspecified: Secondary | ICD-10-CM | POA: Insufficient documentation

## 2019-10-11 DIAGNOSIS — I081 Rheumatic disorders of both mitral and tricuspid valves: Secondary | ICD-10-CM | POA: Diagnosis not present

## 2019-10-11 DIAGNOSIS — I4891 Unspecified atrial fibrillation: Secondary | ICD-10-CM

## 2019-10-11 DIAGNOSIS — Z96642 Presence of left artificial hip joint: Secondary | ICD-10-CM | POA: Diagnosis not present

## 2019-10-11 DIAGNOSIS — Z79899 Other long term (current) drug therapy: Secondary | ICD-10-CM | POA: Diagnosis not present

## 2019-10-11 DIAGNOSIS — I052 Rheumatic mitral stenosis with insufficiency: Secondary | ICD-10-CM | POA: Diagnosis not present

## 2019-10-11 DIAGNOSIS — Z96652 Presence of left artificial knee joint: Secondary | ICD-10-CM | POA: Insufficient documentation

## 2019-10-11 LAB — CBC
HCT: 49.2 % — ABNORMAL HIGH (ref 36.0–46.0)
Hemoglobin: 15.7 g/dL — ABNORMAL HIGH (ref 12.0–15.0)
MCH: 32.4 pg (ref 26.0–34.0)
MCHC: 31.9 g/dL (ref 30.0–36.0)
MCV: 101.7 fL — ABNORMAL HIGH (ref 80.0–100.0)
Platelets: 328 10*3/uL (ref 150–400)
RBC: 4.84 MIL/uL (ref 3.87–5.11)
RDW: 14.1 % (ref 11.5–15.5)
WBC: 15.7 10*3/uL — ABNORMAL HIGH (ref 4.0–10.5)
nRBC: 0 % (ref 0.0–0.2)

## 2019-10-11 LAB — URINALYSIS, ROUTINE W REFLEX MICROSCOPIC
Bilirubin Urine: NEGATIVE
Glucose, UA: NEGATIVE mg/dL
Ketones, ur: NEGATIVE mg/dL
Leukocytes,Ua: NEGATIVE
Nitrite: NEGATIVE
Protein, ur: 30 mg/dL — AB
Specific Gravity, Urine: 1.01 (ref 1.005–1.030)
pH: 7 (ref 5.0–8.0)

## 2019-10-11 LAB — COMPREHENSIVE METABOLIC PANEL
ALT: 20 U/L (ref 0–44)
AST: 28 U/L (ref 15–41)
Albumin: 3.8 g/dL (ref 3.5–5.0)
Alkaline Phosphatase: 111 U/L (ref 38–126)
Anion gap: 19 — ABNORMAL HIGH (ref 5–15)
BUN: 29 mg/dL — ABNORMAL HIGH (ref 8–23)
CO2: 24 mmol/L (ref 22–32)
Calcium: 9.9 mg/dL (ref 8.9–10.3)
Chloride: 95 mmol/L — ABNORMAL LOW (ref 98–111)
Creatinine, Ser: 1.92 mg/dL — ABNORMAL HIGH (ref 0.44–1.00)
GFR calc Af Amer: 28 mL/min — ABNORMAL LOW (ref >60)
GFR calc non Af Amer: 24 mL/min — ABNORMAL LOW (ref >60)
Glucose, Bld: 91 mg/dL (ref 70–99)
Potassium: 3.4 mmol/L — ABNORMAL LOW (ref 3.5–5.1)
Sodium: 138 mmol/L (ref 135–145)
Total Bilirubin: 0.6 mg/dL (ref 0.3–1.2)
Total Protein: 7.6 g/dL (ref 6.5–8.1)

## 2019-10-11 LAB — PROTIME-INR
INR: 1.2 (ref 0.8–1.2)
Prothrombin Time: 15.5 seconds — ABNORMAL HIGH (ref 11.4–15.2)

## 2019-10-11 LAB — SURGICAL PCR SCREEN
MRSA, PCR: NEGATIVE
Staphylococcus aureus: NEGATIVE

## 2019-10-11 LAB — TYPE AND SCREEN
ABO/RH(D): A POS
Antibody Screen: NEGATIVE

## 2019-10-11 LAB — APTT: aPTT: 28 seconds (ref 24–36)

## 2019-10-11 LAB — HEMOGLOBIN A1C
Hgb A1c MFr Bld: 5.9 % — ABNORMAL HIGH (ref 4.8–5.6)
Mean Plasma Glucose: 122.63 mg/dL

## 2019-10-11 LAB — SARS CORONAVIRUS 2 (TAT 6-24 HRS): SARS Coronavirus 2: NEGATIVE

## 2019-10-11 NOTE — Progress Notes (Signed)
PCP - Malachy Mood Lindley-FNP Cardiologist - Dr. Aundra Dubin   Chest x-ray - 10/11/19 EKG - 09/17/19 Stress Test - denies ECHO - 01/24/19 Cardiac Cath - 08/02/19  Sleep Study - denies   Blood Thinner Instructions: Last dose of Eliquis 10/09/19 Aspirin Instructions: Patient instructed to hold all Aspirin, NSAID's, herbal medications, fish oil and vitamins 7 days prior to surgery.    COVID TEST- 10/11/19 at [facility]. Pt re-educated on self quarantine until DOS   Anesthesia review: cardiac history  Patient denies shortness of breath, fever, cough and chest pain at PAT appointment   All instructions explained to the patient, with a verbal understanding of the material. Patient agrees to go over the instructions while at home for a better understanding. Patient also instructed to self quarantine after being tested for COVID-19. The opportunity to ask questions was provided.

## 2019-10-11 NOTE — Progress Notes (Signed)
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  She was initially seen in consultation on August 05, 2019 and she was last seen in our office on September 30, 2019 at which time we made tentative plans for surgery next week.  She returns to our office today as part of her routine preadmission testing and follow-up visit.  She reports no new problems or complaints over the past 10 days.  She specifically denies any new onset of symptoms of fever, malaise, productive cough, or recent exposure to anyone with known or suspected COVID-19 infection.  She stopped taking Eliquis in anticipation of her surgery.   Current Outpatient Medications  Medication Sig Dispense Refill  . acetaminophen (TYLENOL) 500 MG tablet Take 1,000 mg by mouth daily as needed for moderate pain.     Marland Kitchen allopurinol (ZYLOPRIM) 100 MG tablet Take 100 mg by mouth daily.     Marland Kitchen amLODipine (NORVASC) 5 MG tablet Take 1 tablet (5 mg total) by mouth daily. 30 tablet 6  . docusate sodium (COLACE) 100 MG capsule Take 1 capsule (100 mg total) by mouth 2 (two) times daily. (Patient taking differently: Take 100 mg by mouth daily as needed for moderate constipation. ) 10 capsule 0  . furosemide (LASIX) 40 MG tablet  Take 1.5 tablets (60 mg total) by mouth 2 (two) times daily. 270 tablet 3  . levothyroxine (SYNTHROID, LEVOTHROID) 112 MCG tablet Take 112 mcg by mouth daily before breakfast.    . metoprolol tartrate (LOPRESSOR) 50 MG tablet Take 1 tablet (50 mg total) by mouth 2 (two) times daily. 180 tablet 3  . rosuvastatin (CRESTOR) 20 MG tablet Take 1 tablet (20 mg total) by mouth daily. 90 tablet 3  . spironolactone (ALDACTONE) 25 MG tablet Take 0.5 tablets (12.5 mg total) by mouth daily. 45 tablet 3  . ELIQUIS 5 MG TABS tablet Take 1 tablet by mouth twice daily (Patient not taking: No sig reported) 60 tablet 5  . sildenafil (REVATIO) 20 MG tablet Take 1 tablet (20 mg total) by mouth 3 (three) times daily. (Patient not taking: Reported on 10/08/2019) 90 tablet 3   No current facility-administered medications for this visit.    Facility-Administered Medications Ordered in Other Visits  Medication Dose Route Frequency Provider Last Rate Last Dose  . albuterol (PROVENTIL) (2.5 MG/3ML) 0.083% nebulizer solution 2.5 mg  2.5 mg Nebulization Once Laverle Hobby, MD          Physical Exam:   BP 121/72   Pulse 79   Temp 97.8 F (36.6 C) (Skin)   Resp 20   Ht 5\' 3"  (1.6 m)   Wt 148 lb (67.1 kg)   SpO2 98% Comment: RA  BMI 26.22 kg/m   General:  Well-appearing  Chest:  Clear to auscultation with symmetrical breath sounds  CV:   Regular rate and rhythm with systolic murmur  Incisions:  n/a  Abdomen:  Soft nontender  Extremities:  Warm and well-perfused  Diagnostic Tests:  n/a   Impression:  Patient has stage D severe symptomatic primary mitral regurgitation and severe multivessel coronary artery disease.She presents with a long history of chronic diastolic congestive heart failure which has remained stable on current medical therapy, New York Heart Association functional class III. I have personally reviewed the patient's transesophageal echocardiogram and diagnostic cardiac  catheterization. Patient likely has underlying rheumatic heart disease. There is type IIIa mitral valve dysfunction with severe restricted mobility involving the posterior leaflet of the mitral valve. Both leaflets are thickened and the posterior leaflet appears essentially immobile. There is severe calcification of the posterior mitral annulus. The subvalvular apparatus may be a little bit thickened and foreshortened. There appears to be mild mitral stenosis. I am skeptical that the patient's mitral valve will be repairable and suspect it will need to be replaced. Left ventricular systolic function remains normal. There is severe pulmonary hypertension with mild right ventricular chamber enlargement, mild right ventricular systolic dysfunction, and moderate tricuspid regurgitation. Diagnostic cardiac catheterization confirmed the presence of severe pulmonary hypertension and also revealed severe multivessel coronary artery disease. There is severe calcification of the posterior mitral annulus. Concomitant tricuspid valve repair may be necessary. Risks associated with conventional surgical mitral valve replacement and coronary artery bypass grafting would unquestionably be very high. Risks of surgery and the patient's postoperative convalescence will be further adversely affected by the patient's significantphysicaldeconditioningand recent hip fracture.   However, the patient appears to have recovered remarkably well from her broken hip and she reports that her strength is essentially back to her baseline.  Prognosis with long-term medical therapy would be quite poor. I do not feel the patient'smitral regurgitation could be treated using transcatheter edge-to-edge repair. At present transcatheter mitral valve replacement is not available for commercial use,and given the patient's complex past medical history I am skeptical that the patient would be considered a candidate for enrollment in any  of the ongoing trialsthat might beavailable at tertiary care centers where transcatheter mitral valve replacement is actively under investigation.   Plan:  The patientand her husband wereagain counseled at length regarding treatment alternatives for management of severe symptomatic mitral regurgitation and multivessel coronary artery disease.Alternative approaches such as conventional surgical mitral valve repair or replacement with coronary artery bypass graftingand continued medical therapy without intervention were compared and contrasted at length.The relative lack of suitable less invasive options for treatment was explained. Thehighrisks associated with conventional surgery were discussed in detail, as were expectations for post-operative convalescence.The relative risks and benefits of performing a maze procedure at the time of her surgery were discussed at length, including the expected likelihood of long term freedom from recurrent symptomatic atrial fibrillation and/or atrial flutter.  We discussed the possibility of replacing the mitral valve using a mechanical prosthesis with the attendant need for long-term anticoagulation versus the alternative of replacing it using a bioprosthetic tissue valve with its potential for late structural valve deterioration and failure, depending upon the patient's longevity.  The patient specifically requests that if the mitral valve must be replaced that it be done using a bioprosthetic tissue valve.   All questions answered.  We plan to proceed with surgery on October 15, 2019.  The patient understands and accepts all potential risks of surgery including but not limited to risk of death,  stroke or other neurologic complication, myocardial infarction, congestive heart failure, respiratory failure, renal failure, bleeding requiring transfusion and/or reexploration, arrhythmia, infection or other wound complications, pneumonia, pleural and/or  pericardial effusion, pulmonary embolus, aortic dissection or other major vascular complication, late recurrence of ischemic heart disease, and/or delayed complications related to valve repair or replacement including but not limited to structural valve deterioration and failure, thrombosis, embolization, endocarditis, or paravalvular leak.   The relative risks and benefits of performing a maze procedure at the time of her surgery was discussed at length, including the expected likelihood of long term freedom from recurrent symptomatic atrial fibrillation and/or atrial flutter.   All of their questions have been answered.    I spent in excess of 15 minutes during the conduct of this office consultation and >50% of this time involved direct face-to-face encounter with the patient for counseling and/or coordination of their care.   Valentina Gu. Roxy Manns, MD 10/11/2019 1:10 PM

## 2019-10-11 NOTE — Pre-Procedure Instructions (Signed)
[facility] [address] [place] [address] Phone: [phone] Fax: [phone]     Your procedure is scheduled on Tuesday October 20th.  Report to [facility] Main Entrance "A" at 5:30 A.M., and check in at the Admitting office.  Call this number if you have problems the morning of surgery:  [phone]  Call [phone] if you have any questions prior to your surgery date Monday-Friday 8am-4pm    Remember:  Do not eat or drink after midnight the night before your surgery     Take these medicines the morning of surgery with A SIP OF WATER  amLODipine (NORVASC) levothyroxine (SYNTHROID, LEVOTHROID) metoprolol tartrate (LOPRESSOR)  acetaminophen (TYLENOL)-if needed  Follow your surgeon's instructions on when to stop Eliquis.  If no instructions were given by your surgeon then you will need to call the office to get those instructions.     As of today, STOP taking any Aspirin (unless otherwise instructed by your surgeon), Aleve, Naproxen, Ibuprofen, Motrin, Advil, Goody's, BC's, all herbal medications, fish oil, and all vitamins.    The Morning of Surgery  Do not wear jewelry, make-up or nail polish.  Do not wear lotions, powders, or perfumes/colognes, or deodorant  Do not shave 48 hours prior to surgery.  Men may shave face and neck.  Do not bring valuables to the hospital.  [facility] is not responsible for any belongings or valuables.  If you are a smoker, DO NOT Smoke 24 hours prior to surgery IF you wear a CPAP at night please bring your mask, tubing, and machine the morning of surgery   Remember that you must have someone to transport you home after your surgery, and remain with you for 24 hours if you are discharged the same day.   Contacts, glasses, hearing aids, dentures or bridgework may not be worn into surgery.    Leave your suitcase in the car.  After surgery it may be brought to your room.  For  patients admitted to the hospital, discharge time will be determined by your treatment team.  Patients discharged the day of surgery will not be allowed to drive home.    Special instructions:   [facility]- Preparing For Surgery  Before surgery, you can play an important role. Because skin is not sterile, your skin needs to be as free of germs as possible. You can reduce the number of germs on your skin by washing with CHG (chlorahexidine gluconate) Soap before surgery.  CHG is an antiseptic cleaner which kills germs and bonds with the skin to continue killing germs even after washing.    Oral Hygiene is also important to reduce your risk of infection.  Remember - BRUSH YOUR TEETH THE MORNING OF SURGERY WITH YOUR REGULAR TOOTHPASTE  Please do not use if you have an allergy to CHG or antibacterial soaps. If your skin becomes reddened/irritated stop using the CHG.  Do not shave (including legs and underarms) for at least 48 hours prior to first CHG shower. It is OK to shave your face.  Please follow these instructions carefully.   1. Shower the NIGHT BEFORE SURGERY and the MORNING OF SURGERY with CHG Soap.   2. If you chose to wash your hair, wash your hair first as usual with your normal shampoo.  3. After you shampoo, rinse your hair and body thoroughly to remove the shampoo.  4. Use CHG as you would any other liquid soap. You can  apply CHG directly to the skin and wash gently with a scrungie or a clean washcloth.   5. Apply the CHG Soap to your body ONLY FROM THE NECK DOWN.  Do not use on open wounds or open sores. Avoid contact with your eyes, ears, mouth and genitals (private parts). Wash Face and genitals (private parts)  with your normal soap.   6. Wash thoroughly, paying special attention to the area where your surgery will be performed.  7. Thoroughly rinse your body with warm water from the neck down.  8. DO NOT shower/wash with your normal soap after using and rinsing off the  CHG Soap.  9. Pat yourself dry with a CLEAN TOWEL.  10. Wear CLEAN PAJAMAS to bed the night before surgery, wear comfortable clothes the morning of surgery  11. Place CLEAN SHEETS on your bed the night of your first shower and DO NOT SLEEP WITH PETS.    Day of Surgery:  Do not apply any deodorants/lotions. Please shower the morning of surgery with the CHG soap  Please wear clean clothes to the hospital/surgery center.   Remember to brush your teeth WITH YOUR REGULAR TOOTHPASTE.   Please read over the following fact sheets that you were given.

## 2019-10-11 NOTE — Patient Instructions (Signed)
Do not take Eliquis  Continue taking all other medications without change through the day before surgery.  Have nothing to eat or drink after midnight the night before surgery.  On the morning of surgery take only Synthroid and Metoprolol with a sip of water.

## 2019-10-11 NOTE — Progress Notes (Signed)
Pre op vascular       has been completed. Preliminary results can be found under CV proc through chart review. Lizandro Spellman, BS, RDMS, RVT   

## 2019-10-14 ENCOUNTER — Encounter: Payer: Self-pay | Admitting: Emergency Medicine

## 2019-10-14 ENCOUNTER — Other Ambulatory Visit: Payer: Self-pay

## 2019-10-14 ENCOUNTER — Inpatient Hospital Stay
Admission: EM | Admit: 2019-10-14 | Discharge: 2019-10-27 | DRG: 871 | Disposition: E | Payer: Medicare Other | Attending: Internal Medicine | Admitting: Internal Medicine

## 2019-10-14 ENCOUNTER — Other Ambulatory Visit: Payer: Self-pay | Admitting: *Deleted

## 2019-10-14 ENCOUNTER — Ambulatory Visit: Payer: Medicare Other | Admitting: Thoracic Surgery (Cardiothoracic Vascular Surgery)

## 2019-10-14 ENCOUNTER — Emergency Department: Payer: Medicare Other

## 2019-10-14 ENCOUNTER — Encounter (HOSPITAL_COMMUNITY): Payer: Self-pay | Admitting: Certified Registered"

## 2019-10-14 ENCOUNTER — Encounter (HOSPITAL_COMMUNITY): Payer: Self-pay | Admitting: Vascular Surgery

## 2019-10-14 DIAGNOSIS — J189 Pneumonia, unspecified organism: Secondary | ICD-10-CM | POA: Diagnosis present

## 2019-10-14 DIAGNOSIS — I081 Rheumatic disorders of both mitral and tricuspid valves: Secondary | ICD-10-CM | POA: Diagnosis present

## 2019-10-14 DIAGNOSIS — I10 Essential (primary) hypertension: Secondary | ICD-10-CM | POA: Diagnosis present

## 2019-10-14 DIAGNOSIS — I480 Paroxysmal atrial fibrillation: Secondary | ICD-10-CM | POA: Diagnosis present

## 2019-10-14 DIAGNOSIS — Z87442 Personal history of urinary calculi: Secondary | ICD-10-CM

## 2019-10-14 DIAGNOSIS — R7989 Other specified abnormal findings of blood chemistry: Secondary | ICD-10-CM

## 2019-10-14 DIAGNOSIS — Z90710 Acquired absence of both cervix and uterus: Secondary | ICD-10-CM

## 2019-10-14 DIAGNOSIS — Z8349 Family history of other endocrine, nutritional and metabolic diseases: Secondary | ICD-10-CM

## 2019-10-14 DIAGNOSIS — I2510 Atherosclerotic heart disease of native coronary artery without angina pectoris: Secondary | ICD-10-CM

## 2019-10-14 DIAGNOSIS — Z8673 Personal history of transient ischemic attack (TIA), and cerebral infarction without residual deficits: Secondary | ICD-10-CM

## 2019-10-14 DIAGNOSIS — R0602 Shortness of breath: Secondary | ICD-10-CM

## 2019-10-14 DIAGNOSIS — I4901 Ventricular fibrillation: Secondary | ICD-10-CM | POA: Diagnosis not present

## 2019-10-14 DIAGNOSIS — I959 Hypotension, unspecified: Secondary | ICD-10-CM | POA: Diagnosis present

## 2019-10-14 DIAGNOSIS — Z20828 Contact with and (suspected) exposure to other viral communicable diseases: Secondary | ICD-10-CM | POA: Diagnosis present

## 2019-10-14 DIAGNOSIS — Z96642 Presence of left artificial hip joint: Secondary | ICD-10-CM | POA: Diagnosis present

## 2019-10-14 DIAGNOSIS — I471 Supraventricular tachycardia: Secondary | ICD-10-CM | POA: Diagnosis not present

## 2019-10-14 DIAGNOSIS — Z96652 Presence of left artificial knee joint: Secondary | ICD-10-CM | POA: Diagnosis present

## 2019-10-14 DIAGNOSIS — A419 Sepsis, unspecified organism: Principal | ICD-10-CM

## 2019-10-14 DIAGNOSIS — I462 Cardiac arrest due to underlying cardiac condition: Secondary | ICD-10-CM | POA: Diagnosis not present

## 2019-10-14 DIAGNOSIS — I739 Peripheral vascular disease, unspecified: Secondary | ICD-10-CM | POA: Diagnosis present

## 2019-10-14 DIAGNOSIS — I340 Nonrheumatic mitral (valve) insufficiency: Secondary | ICD-10-CM

## 2019-10-14 DIAGNOSIS — E876 Hypokalemia: Secondary | ICD-10-CM | POA: Diagnosis not present

## 2019-10-14 DIAGNOSIS — E782 Mixed hyperlipidemia: Secondary | ICD-10-CM | POA: Diagnosis present

## 2019-10-14 DIAGNOSIS — Z23 Encounter for immunization: Secondary | ICD-10-CM

## 2019-10-14 DIAGNOSIS — R6510 Systemic inflammatory response syndrome (SIRS) of non-infectious origin without acute organ dysfunction: Secondary | ICD-10-CM | POA: Diagnosis present

## 2019-10-14 DIAGNOSIS — Z7901 Long term (current) use of anticoagulants: Secondary | ICD-10-CM

## 2019-10-14 DIAGNOSIS — I5032 Chronic diastolic (congestive) heart failure: Secondary | ICD-10-CM | POA: Diagnosis present

## 2019-10-14 DIAGNOSIS — I130 Hypertensive heart and chronic kidney disease with heart failure and stage 1 through stage 4 chronic kidney disease, or unspecified chronic kidney disease: Secondary | ICD-10-CM | POA: Diagnosis present

## 2019-10-14 DIAGNOSIS — Z79890 Hormone replacement therapy (postmenopausal): Secondary | ICD-10-CM

## 2019-10-14 DIAGNOSIS — R410 Disorientation, unspecified: Secondary | ICD-10-CM

## 2019-10-14 DIAGNOSIS — Z8249 Family history of ischemic heart disease and other diseases of the circulatory system: Secondary | ICD-10-CM

## 2019-10-14 DIAGNOSIS — N184 Chronic kidney disease, stage 4 (severe): Secondary | ICD-10-CM | POA: Diagnosis present

## 2019-10-14 DIAGNOSIS — I2721 Secondary pulmonary arterial hypertension: Secondary | ICD-10-CM | POA: Diagnosis present

## 2019-10-14 DIAGNOSIS — Z79899 Other long term (current) drug therapy: Secondary | ICD-10-CM

## 2019-10-14 DIAGNOSIS — J9601 Acute respiratory failure with hypoxia: Secondary | ICD-10-CM | POA: Diagnosis not present

## 2019-10-14 DIAGNOSIS — R778 Other specified abnormalities of plasma proteins: Secondary | ICD-10-CM | POA: Diagnosis present

## 2019-10-14 DIAGNOSIS — N183 Chronic kidney disease, stage 3 unspecified: Secondary | ICD-10-CM | POA: Diagnosis present

## 2019-10-14 DIAGNOSIS — J9602 Acute respiratory failure with hypercapnia: Secondary | ICD-10-CM | POA: Diagnosis not present

## 2019-10-14 DIAGNOSIS — E039 Hypothyroidism, unspecified: Secondary | ICD-10-CM | POA: Diagnosis present

## 2019-10-14 LAB — CBC WITH DIFFERENTIAL/PLATELET
Abs Immature Granulocytes: 0.11 10*3/uL — ABNORMAL HIGH (ref 0.00–0.07)
Basophils Absolute: 0.1 10*3/uL (ref 0.0–0.1)
Basophils Relative: 0 %
Eosinophils Absolute: 0.1 10*3/uL (ref 0.0–0.5)
Eosinophils Relative: 1 %
HCT: 42.1 % (ref 36.0–46.0)
Hemoglobin: 14 g/dL (ref 12.0–15.0)
Immature Granulocytes: 1 %
Lymphocytes Relative: 19 %
Lymphs Abs: 3.6 10*3/uL (ref 0.7–4.0)
MCH: 31.7 pg (ref 26.0–34.0)
MCHC: 33.3 g/dL (ref 30.0–36.0)
MCV: 95.5 fL (ref 80.0–100.0)
Monocytes Absolute: 2.7 10*3/uL — ABNORMAL HIGH (ref 0.1–1.0)
Monocytes Relative: 14 %
Neutro Abs: 12.3 10*3/uL — ABNORMAL HIGH (ref 1.7–7.7)
Neutrophils Relative %: 65 %
Platelets: 302 10*3/uL (ref 150–400)
RBC: 4.41 MIL/uL (ref 3.87–5.11)
RDW: 13.7 % (ref 11.5–15.5)
WBC: 18.8 10*3/uL — ABNORMAL HIGH (ref 4.0–10.5)
nRBC: 0 % (ref 0.0–0.2)

## 2019-10-14 LAB — COMPREHENSIVE METABOLIC PANEL
ALT: 15 U/L (ref 0–44)
AST: 27 U/L (ref 15–41)
Albumin: 3.4 g/dL — ABNORMAL LOW (ref 3.5–5.0)
Alkaline Phosphatase: 98 U/L (ref 38–126)
Anion gap: 15 (ref 5–15)
BUN: 29 mg/dL — ABNORMAL HIGH (ref 8–23)
CO2: 28 mmol/L (ref 22–32)
Calcium: 9.1 mg/dL (ref 8.9–10.3)
Chloride: 92 mmol/L — ABNORMAL LOW (ref 98–111)
Creatinine, Ser: 1.66 mg/dL — ABNORMAL HIGH (ref 0.44–1.00)
GFR calc Af Amer: 34 mL/min — ABNORMAL LOW (ref >60)
GFR calc non Af Amer: 29 mL/min — ABNORMAL LOW (ref >60)
Glucose, Bld: 103 mg/dL — ABNORMAL HIGH (ref 70–99)
Potassium: 4 mmol/L (ref 3.5–5.1)
Sodium: 135 mmol/L (ref 135–145)
Total Bilirubin: 1 mg/dL (ref 0.3–1.2)
Total Protein: 7.5 g/dL (ref 6.5–8.1)

## 2019-10-14 LAB — URINALYSIS, COMPLETE (UACMP) WITH MICROSCOPIC
Bacteria, UA: NONE SEEN
Bilirubin Urine: NEGATIVE
Glucose, UA: NEGATIVE mg/dL
Ketones, ur: NEGATIVE mg/dL
Leukocytes,Ua: NEGATIVE
Nitrite: NEGATIVE
Protein, ur: 100 mg/dL — AB
Specific Gravity, Urine: 1.013 (ref 1.005–1.030)
pH: 6 (ref 5.0–8.0)

## 2019-10-14 LAB — TROPONIN I (HIGH SENSITIVITY)
Troponin I (High Sensitivity): 99 ng/L — ABNORMAL HIGH (ref <18)
Troponin I (High Sensitivity): 256 ng/L (ref <18)

## 2019-10-14 LAB — SARS CORONAVIRUS 2 BY RT PCR (HOSPITAL ORDER, PERFORMED IN ~~LOC~~ HOSPITAL LAB): SARS Coronavirus 2: NEGATIVE

## 2019-10-14 LAB — BRAIN NATRIURETIC PEPTIDE: B Natriuretic Peptide: 425 pg/mL — ABNORMAL HIGH (ref 0.0–100.0)

## 2019-10-14 LAB — LACTIC ACID, PLASMA
Lactic Acid, Venous: 1.9 mmol/L (ref 0.5–1.9)
Lactic Acid, Venous: 1.2 mmol/L (ref 0.5–1.9)

## 2019-10-14 LAB — HEPARIN LEVEL (UNFRACTIONATED): Heparin Unfractionated: 0.25 IU/mL — ABNORMAL LOW (ref 0.30–0.70)

## 2019-10-14 LAB — APTT: aPTT: 38 seconds — ABNORMAL HIGH (ref 24–36)

## 2019-10-14 LAB — PROTIME-INR
INR: 1.3 — ABNORMAL HIGH (ref 0.8–1.2)
Prothrombin Time: 16.2 seconds — ABNORMAL HIGH (ref 11.4–15.2)

## 2019-10-14 MED ORDER — VANCOMYCIN HCL 10 G IV SOLR
1500.0000 mg | Freq: Once | INTRAVENOUS | Status: DC
  Filled 2019-10-14: qty 1500

## 2019-10-14 MED ORDER — POTASSIUM CHLORIDE 2 MEQ/ML IV SOLN
80.0000 meq | INTRAVENOUS | Status: DC
  Filled 2019-10-14: qty 40

## 2019-10-14 MED ORDER — VANCOMYCIN HCL 1000 MG IV SOLR
INTRAVENOUS | Status: DC
  Filled 2019-10-14: qty 1000

## 2019-10-14 MED ORDER — HEPARIN (PORCINE) 25000 UT/250ML-% IV SOLN
750.0000 [IU]/h | INTRAVENOUS | Status: DC
  Administered 2019-10-14 – 2019-10-16 (×2): 750 [IU]/h via INTRAVENOUS
  Filled 2019-10-14 (×2): qty 250

## 2019-10-14 MED ORDER — MANNITOL 20 % IV SOLN
Freq: Once | INTRAVENOUS | Status: DC
  Filled 2019-10-14: qty 13

## 2019-10-14 MED ORDER — DOPAMINE-DEXTROSE 3.2-5 MG/ML-% IV SOLN
0.0000 ug/kg/min | INTRAVENOUS | Status: DC
  Filled 2019-10-14: qty 250

## 2019-10-14 MED ORDER — SODIUM CHLORIDE 0.9 % IV SOLN
1.5000 g | INTRAVENOUS | Status: DC
  Filled 2019-10-14: qty 1.5

## 2019-10-14 MED ORDER — TRANEXAMIC ACID (OHS) BOLUS VIA INFUSION
15.0000 mg/kg | INTRAVENOUS | Status: DC
  Filled 2019-10-14: qty 1007

## 2019-10-14 MED ORDER — HEPARIN BOLUS VIA INFUSION
3800.0000 [IU] | Freq: Once | INTRAVENOUS | Status: AC
  Administered 2019-10-14: 3800 [IU] via INTRAVENOUS
  Filled 2019-10-14: qty 3800

## 2019-10-14 MED ORDER — SODIUM CHLORIDE 0.9 % IV SOLN
2.0000 g | Freq: Once | INTRAVENOUS | Status: AC
  Administered 2019-10-14: 2 g via INTRAVENOUS
  Filled 2019-10-14: qty 2

## 2019-10-14 MED ORDER — SODIUM CHLORIDE 0.9 % IV SOLN
INTRAVENOUS | Status: DC
  Filled 2019-10-14: qty 30

## 2019-10-14 MED ORDER — NITROGLYCERIN IN D5W 200-5 MCG/ML-% IV SOLN
2.0000 ug/min | INTRAVENOUS | Status: DC
  Filled 2019-10-14: qty 250

## 2019-10-14 MED ORDER — INSULIN REGULAR(HUMAN) IN NACL 100-0.9 UT/100ML-% IV SOLN
INTRAVENOUS | Status: DC
  Filled 2019-10-14: qty 100

## 2019-10-14 MED ORDER — ACETAMINOPHEN 325 MG PO TABS
650.0000 mg | ORAL_TABLET | Freq: Once | ORAL | Status: AC
  Administered 2019-10-14: 19:00:00 650 mg via ORAL
  Filled 2019-10-14: qty 2

## 2019-10-14 MED ORDER — PLASMA-LYTE 148 IV SOLN
INTRAVENOUS | Status: DC
  Filled 2019-10-14: qty 2.5

## 2019-10-14 MED ORDER — SODIUM CHLORIDE 0.9 % IV BOLUS (SEPSIS)
500.0000 mL | Freq: Once | INTRAVENOUS | Status: AC
  Administered 2019-10-14: 500 mL via INTRAVENOUS

## 2019-10-14 MED ORDER — PHENYLEPHRINE HCL-NACL 20-0.9 MG/250ML-% IV SOLN
30.0000 ug/min | INTRAVENOUS | Status: DC
  Filled 2019-10-14: qty 250

## 2019-10-14 MED ORDER — NOREPINEPHRINE 4 MG/250ML-% IV SOLN
0.0000 ug/min | INTRAVENOUS | Status: DC
  Filled 2019-10-14: qty 250

## 2019-10-14 MED ORDER — MILRINONE LACTATE IN DEXTROSE 20-5 MG/100ML-% IV SOLN
0.3000 ug/kg/min | INTRAVENOUS | Status: DC
  Filled 2019-10-14: qty 100

## 2019-10-14 MED ORDER — VANCOMYCIN HCL 10 G IV SOLR
1250.0000 mg | INTRAVENOUS | Status: DC
  Filled 2019-10-14: qty 1250

## 2019-10-14 MED ORDER — SODIUM CHLORIDE 0.9 % IV SOLN
750.0000 mg | INTRAVENOUS | Status: DC
  Filled 2019-10-14: qty 750

## 2019-10-14 MED ORDER — VANCOMYCIN HCL 1.5 G IV SOLR
1500.0000 mg | Freq: Once | INTRAVENOUS | Status: AC
  Administered 2019-10-14: 20:00:00 1500 mg via INTRAVENOUS
  Filled 2019-10-14: qty 1500

## 2019-10-14 MED ORDER — EPINEPHRINE HCL 5 MG/250ML IV SOLN IN NS
0.0000 ug/min | INTRAVENOUS | Status: DC
  Filled 2019-10-14: qty 250

## 2019-10-14 MED ORDER — TRANEXAMIC ACID (OHS) PUMP PRIME SOLUTION
2.0000 mg/kg | INTRAVENOUS | Status: DC
  Filled 2019-10-14: qty 1.34

## 2019-10-14 MED ORDER — GLUTARALDEHYDE 0.625% SOAKING SOLUTION
Freq: Once | TOPICAL | Status: DC | PRN
  Filled 2019-10-14: qty 50

## 2019-10-14 MED ORDER — TRANEXAMIC ACID 1000 MG/10ML IV SOLN
1.5000 mg/kg/h | INTRAVENOUS | Status: DC
  Filled 2019-10-14: qty 25

## 2019-10-14 MED ORDER — DEXMEDETOMIDINE HCL IN NACL 400 MCG/100ML IV SOLN
0.1000 ug/kg/h | INTRAVENOUS | Status: DC
  Filled 2019-10-14: qty 100

## 2019-10-14 NOTE — Progress Notes (Signed)
ANTICOAGULATION CONSULT NOTE - Initial Consult  Pharmacy Consult for Heparin  Indication: chest pain/ACS  No Known Allergies  Patient Measurements: Height: 5' 2.5" (158.8 cm) Weight: 140 lb (63.5 kg) IBW/kg (Calculated) : 51.25 Heparin Dosing Weight: 63.5 kg   Vital Signs: Temp: 100.9 F (38.3 C) (10/19 1816) Temp Source: Oral (10/19 1816) BP: 95/49 (10/19 2130) Pulse Rate: 72 (10/19 2130)  Labs: Recent Labs    10/23/2019 1732 10/09/2019 2115  HGB 14.0  --   HCT 42.1  --   PLT 302  --   CREATININE 1.66*  --   TROPONINIHS 99* 256*    Estimated Creatinine Clearance: 24.4 mL/min (A) (by C-G formula based on SCr of 1.66 mg/dL (H)).   Medical History: Past Medical History:  Diagnosis Date  . Arthritis   . CAD   . Chronic diastolic CHF (congestive heart failure) ([place])   . CKD (chronic kidney disease), stage IV ([place])   . coronary artery disease   . History of kidney stones 01/2017  . Hypertension   . Hypothyroidism   . Mitral regurgitation and mitral stenosis   . PAF (paroxysmal atrial fibrillation) ([place])    a. On Eliquis; b. CHADS2VASc => 8 (CHF, HTN, age x 2, stroke x 2, vascular disease, female)  . Stroke [facility])    TIA 9/17 & 04/06/2017  . Tricuspid regurgitation     Medications:  (Not in a hospital admission)   Assessment: Pharmacy consulted to dose heparin in this 78 year old female admitted with ACS/NSTEMI.  Pt was on Eliquis PTA, last dose was on 10/14 .  Eliquis was stopped due to surgical procedure scheduled for 10/20.  CrCl = 24.4 ml/min   Goal of Therapy:  Heparin level 0.3-0.7 units/ml Monitor platelets by anticoagulation protocol: Yes   Plan:  Will draw baseline aPTT, HL and INR.  Give 3800 units bolus x 1 Start heparin infusion at 750 units/hr Check anti-Xa level in 8 hours and daily while on heparin Continue to monitor H&H and platelets  Jade Burkard D 10/05/2019,10:29 PM

## 2019-10-14 NOTE — ED Notes (Signed)
Date and time results received: 09/26/2019 2206  Test: troponin Critical Value: 256  Name of Provider Notified: Dr Ellender Hose

## 2019-10-14 NOTE — ED Notes (Signed)
Called pharmacy to send abx. Neither available in pyxis currently.

## 2019-10-14 NOTE — Anesthesia Preprocedure Evaluation (Deleted)
Anesthesia Evaluation    Airway        Dental   Pulmonary           Cardiovascular hypertension,      Neuro/Psych    GI/Hepatic   Endo/Other    Renal/GU      Musculoskeletal   Abdominal   Peds  Hematology   Anesthesia Other Findings   Reproductive/Obstetrics                             Anesthesia Physical Anesthesia Plan  ASA:   Anesthesia Plan:    Post-op Pain Management:    Induction:   PONV Risk Score and Plan:   Airway Management Planned:   Additional Equipment:   Intra-op Plan:   Post-operative Plan:   Informed Consent:   Plan Discussed with:   Anesthesia Plan Comments: (PAT note written 10/21/2019 by Myra Gianotti, PA-C. STAT BMET ordered on day of surgery per Dr. Roxy Manns.  )       Anesthesia Quick Evaluation

## 2019-10-14 NOTE — Progress Notes (Signed)
CODE SEPSIS - PHARMACY COMMUNICATION  **Broad Spectrum Antibiotics should be administered within 1 hour of Sepsis diagnosis**  Time Code Sepsis Called/Page Received: @1817   Antibiotics Ordered: Cefepime and Vancomycin   Time of 1st antibiotic administration: Cefepime @1904       Rowland Lathe ,PharmD Clinical Pharmacist  10/25/2019  6:44 PM

## 2019-10-14 NOTE — ED Triage Notes (Signed)
Pt arrived from home via [facility] following a period of SOB that was unrelieved by home O2. Pt was 88% on 4L O2 when EMS arrived.

## 2019-10-14 NOTE — Progress Notes (Signed)
Anesthesia Chart Review:  Case: [id number] Date/Time: 10/15/19 0715   Procedures:      MITRAL VALVE REPAIR (MVR) OR REPLACEMENT (N/A Chest) - GLUTARALDEHYDE     CORONARY ARTERY BYPASS GRAFTING (CABG) (N/A Chest)     MAZE (N/A )     possible TRICUSPID VALVE REPAIR (N/A Chest)     TRANSESOPHAGEAL ECHOCARDIOGRAM (TEE) (N/A )   Anesthesia type: General   Pre-op diagnosis:      MR     CAD     AFIB     TR   Location: [facility] OR ROOM 15 / [place] OR   Surgeon: Rexene Alberts, MD      DISCUSSION: Patient is a 78 year old female scheduled for the above procedure.  History includes never smoker, mitral valve disease (severe MR, mild MS 07/2019), TR (moderate 07/2019), CAD, afib/PAF, chronic diastolic CHF, PAD, CKD stage III-IV, CVA (TIA 08/2016, CVA 06/06/17), HTN, hypothyroidism. PRN home O2 use by cardiology notes. - 08/06/19 s/p fall with left femoral neck fracture s/p left THA 08/08/19.  Reported last Eliquis 10/09/19.  Preoperative labs show WBC 15.7, Cr 1.92 (known CKD, up from 1.46 on 09/17/19 with previous range of 1.30-2.51 since 08/2018; Cr 2.51 occurred post THA with Cr most often in the ~ 1.60 range). Dr. Roxy Manns has reviewed labs. He wants a STAT BMET on the day of surgery to recheck renal function.   10/11/19 COVID-19 test negative. For BMET and anesthesia and surgery team evaluation on the day of surgery.    VS: BP 124/73   Pulse 69   Temp 36.5 C (Temporal)   Resp 18   Ht 5' 2.5" (1.588 m)   Wt 67.1 kg   SpO2 100%   BMI 26.63 kg/m   PROVIDERS: Remi Haggard, FNP is PCP Ida Rogue, MD is cardiologist Loralie Champagne, MD is HF cardiologist  LABS: Preoperative labs reviewed. See DISCUSSION. (all labs ordered are listed, but only abnormal results are displayed)  Labs Reviewed  CBC - Abnormal; Notable for the following components:      Result Value   WBC 15.7 (*)    Hemoglobin 15.7 (*)    HCT 49.2 (*)    MCV 101.7 (*)    All other components within normal limits   COMPREHENSIVE METABOLIC PANEL - Abnormal; Notable for the following components:   Potassium 3.4 (*)    Chloride 95 (*)    BUN 29 (*)    Creatinine, Ser 1.92 (*)    GFR calc non Af Amer 24 (*)    GFR calc Af Amer 28 (*)    Anion gap 19 (*)    All other components within normal limits  HEMOGLOBIN A1C - Abnormal; Notable for the following components:   Hgb A1c MFr Bld 5.9 (*)    All other components within normal limits  PROTIME-INR - Abnormal; Notable for the following components:   Prothrombin Time 15.5 (*)    All other components within normal limits  URINALYSIS, ROUTINE W REFLEX MICROSCOPIC - Abnormal; Notable for the following components:   APPearance HAZY (*)    Hgb urine dipstick SMALL (*)    Protein, ur 30 (*)    Bacteria, UA RARE (*)    All other components within normal limits  SURGICAL PCR SCREEN  APTT  BLOOD GAS, ARTERIAL  TYPE AND SCREEN    Spirometry 03/20/18: FVC 1.19 (47%), post 1.38 (55%). FEV1 1.17 (62%), post 1.16 (62%). DLCO 11.8 (65%). Moderate Restrictive Lung Disease with Significant Broncho-Dilator  Response   IMAGES: CXR 10/11/19: IMPRESSION: No active cardiopulmonary disease.    EKG: 09/17/19: Normal sinus rhythm with sinus arrhythmia Septal infarct , age undetermined Abnormal ECG No significant change since last tracing Confirmed by Lauree Chandler [phone]) on 09/18/2019 12:08:01 PM    CV: Carotid US 10/11/19: Summary: - Right Carotid: Velocities in the right ICA are consistent with a 1-39% stenosis. The ECA appears >50% stenosed. - Left Carotid: Velocities in the left ICA are consistent with a 40-59% stenosis. - Vertebrals:  Right vertebral artery demonstrates antegrade flow. Left vertebral artery demonstrates retrograde flow. - Subclavians: Normal flow hemodynamics were seen in the right subclavian artery. Left subclavian monophasic, indicating a more proximal stenosis.   TEE 87/20: IMPRESSIONS  1. The left ventricle has normal  systolic function, with an ejection fraction of 60-65%. The cavity size was normal. There is mildly increased left ventricular wall thickness. No evidence of left ventricular regional wall motion abnormalities.  2. The right ventricle has mildly reduced systolic function. The cavity was mildly enlarged.  3. Left atrial size was moderately dilated.  4. The mitral valve was thickened/calcified with significant restriction of the posterior leaflet. ?Rheumatic. There was severe mitral regurgitation with ERO 0.36 cm^2 but calculated regurgitant volume 97 cc. There was flow reversal in the pulmonary  vein systolic doppler pattern. Mean gradient across the valve 5 mmHg, MVA 3.38 cm^2 by PHT but 2.35 cm^2 by planimetry. Suspect no more than mild mitral stenosis.  5. There was moderate TR, peak RV-RA gradient 53 mmHg.  6. The aortic valve is tricuspid Moderate calcification of the aortic valve. Aortic valve regurgitation is trivial by color flow Doppler. No stenosis of the aortic valve.  7. Normal caliber thoracic aorta with grade 4 plaque in the descending thoracic aorta.  8. No evidence for PFO/ASD by color doppler.   Cardiac cath 08/02/19: 1. Severe 3 vessel disease.  2. Elevated right and left heart filling pressures.  3. Low cardiac output (CI 2.0).  4. 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  5. Prominent v-wave in the PCWP tracing is consistent with severe mitral regurgitation noted on TEE.  6. The discrepancy between LVEDP and PCWP suggests a degree of mitral stenosis (MS looked mild on TEE).  - No chest pain and dyspnea is stable.  I will let her go home today but will increase Lasix to 40 mg bid.  I am going to refer Mrs Talley for TCTS evaluation.  She will need CABG + MV replacement ideally.   Event Monitor 07/22/17-08/20/17: Study Highlights Normal sinus rhythm with episodes of paroxysmal atrial fibrillation (4 hours total), Also with episodes of SVT rate up to 180  bpm, lasting <30 seconds   Past Medical History:  Diagnosis Date  . Arthritis   . CAD   . Chronic diastolic CHF (congestive heart failure) ([place])   . CKD (chronic kidney disease), stage IV ([place])   . coronary artery disease   . History of kidney stones 01/2017  . Hypertension   . Hypothyroidism   . Mitral regurgitation and mitral stenosis   . PAF (paroxysmal atrial fibrillation) ([place])    a. On Eliquis; b. CHADS2VASc => 8 (CHF, HTN, age x 2, stroke x 2, vascular disease, female)  . Stroke [facility])    TIA 9/17 & 04/06/2017  . Tricuspid regurgitation     Past Surgical History:  Procedure Laterality Date  . ABDOMINAL HYSTERECTOMY    . CARDIAC CATHETERIZATION     Negative   .  COLONOSCOPY  2010  . PARTIAL HYSTERECTOMY    . RIGHT/LEFT HEART CATH AND CORONARY ANGIOGRAPHY N/A 08/02/2019   Procedure: RIGHT/LEFT HEART CATH AND CORONARY ANGIOGRAPHY;  Surgeon: Larey Dresser, MD;  Location: [facility];  Service: Cardiovascular;  Laterality: N/A;  . TEE WITHOUT CARDIOVERSION N/A 08/02/2019   Procedure: TRANSESOPHAGEAL ECHOCARDIOGRAM (TEE);  Surgeon: Larey Dresser, MD;  Location: [facility] ENDOSCOPY;  Service: Cardiovascular;  Laterality: N/A;  . TONSILLECTOMY    . TOTAL HIP ARTHROPLASTY Left 08/08/2019   Procedure: TOTAL HIP ARTHROPLASTY ANTERIOR APPROACH;  Surgeon: Rod Can, MD;  Location: [facility];  Service: Orthopedics;  Laterality: Left;  . TOTAL KNEE ARTHROPLASTY Left 11/28/2017   Procedure: TOTAL KNEE ARTHROPLASTY;  Surgeon: Thornton Park, MD;  Location: [facility] ORS;  Service: Orthopedics;  Laterality: Left;    MEDICATIONS: . acetaminophen (TYLENOL) 500 MG tablet  . allopurinol (ZYLOPRIM) 100 MG tablet  . amLODipine (NORVASC) 5 MG tablet  . docusate sodium (COLACE) 100 MG capsule  . ELIQUIS 5 MG TABS tablet  . furosemide (LASIX) 40 MG tablet  . levothyroxine (SYNTHROID, LEVOTHROID) 112 MCG tablet  . metoprolol tartrate (LOPRESSOR) 50 MG tablet  . rosuvastatin (CRESTOR) 20 MG  tablet  . sildenafil (REVATIO) 20 MG tablet  . spironolactone (ALDACTONE) 25 MG tablet   No current facility-administered medications for this encounter.    Marland Kitchen albuterol (PROVENTIL) (2.5 MG/3ML) 0.083% nebulizer solution 2.5 mg  . [START ON 10/15/2019] cefUROXime (ZINACEF) 1.5 g in sodium chloride 0.9 % 100 mL IVPB  . cefUROXime (ZINACEF) 750 mg in sodium chloride 0.9 % 100 mL IVPB  . [START ON 10/15/2019] dexmedetomidine (PRECEDEX) 400 MCG/100ML (4 mcg/mL) infusion  . [START ON 10/15/2019] DOPamine (INTROPIN) 800 mg in dextrose 5 % 250 mL (3.2 mg/mL) infusion  . [START ON 10/15/2019] EPINEPHrine (ADRENALIN) 4 mg in NS 250 mL (0.016 mg/mL) premix infusion  . [START ON 10/15/2019] glutaraldehyde 0.625% cardiac soaking solution  . [START ON 10/15/2019] heparin 2,500 Units, papaverine 30 mg in electrolyte-148 (PLASMALYTE-148) 500 mL irrigation  . [START ON 10/15/2019] heparin 30,000 units/NS 1000 mL solution for CELLSAVER  . [START ON 10/15/2019] insulin regular, human (MYXREDLIN) 100 units/ 100 mL infusion  . [START ON 10/15/2019] [place] Blood Cardioplegia vial (lidocaine/magnesium/mannitol 0.26g-4g-6.4g)  . [START ON 10/15/2019] milrinone (PRIMACOR) 20 MG/100 ML (0.2 mg/mL) infusion  . [START ON 10/15/2019] nitroGLYCERIN 50 mg in dextrose 5 % 250 mL (0.2 mg/mL) infusion  . [START ON 10/15/2019] norepinephrine (LEVOPHED) 4mg  in 227mL premix infusion  . [START ON 10/15/2019] phenylephrine (NEOSYNEPHRINE) 20-0.9 MG/250ML-% infusion  . [START ON 10/15/2019] potassium chloride injection 80 mEq  . [START ON 10/15/2019] tranexamic acid (CYKLOKAPRON) 2,500 mg in sodium chloride 0.9 % 250 mL (10 mg/mL) infusion  . [START ON 10/15/2019] tranexamic acid (CYKLOKAPRON) bolus via infusion - over 30 minutes 1,006.5 mg  . [START ON 10/15/2019] tranexamic acid (CYKLOKAPRON) pump prime solution 134 mg  . [START ON 10/15/2019] vancomycin (VANCOCIN) 1,000 mg in sodium chloride 0.9 % 1,000 mL irrigation  .  [START ON 10/15/2019] vancomycin (VANCOCIN) 1,250 mg in sodium chloride 0.9 % 250 mL IVPB     Myra Gianotti, PA-C Surgical Short Stay/Anesthesiology [facility] Phone [phone] [facility] Phone [phone] 10/15/2019 10:03 AM

## 2019-10-14 NOTE — ED Notes (Signed)
abx still not received.

## 2019-10-14 NOTE — ED Notes (Signed)
[place] for husband to come back per dr Ellender Hose.

## 2019-10-14 NOTE — ED Provider Notes (Signed)
[facility] Emergency Department Provider Note  ____________________________________________   First MD Initiated Contact with Patient 09/26/2019 1703     (approximate)  I have reviewed the triage vital signs and the nursing notes.   HISTORY  Chief Complaint Shortness of Breath    HPI Caroline [name] is a 78 y.o. female  With h/o CHF, CKD, CAD, pAFib, CVA here with weakness, fever, confusion.  History somewhat limited.  Per report, the patient is actually scheduled to get a mitral valve repair and CABG tomorrow.  She has chronic CHF and mitral regurgitation with stenosis as well as A. fib.  She has been off her anticoagulant in preparation for the upcoming surgery. Per report, she began to complain of shortness of breath earlier today. She had been otherwise feeling okay. She then became somewhat "out of it" and was confused per husband's report. She did not remember her surgery or why she was getting it. She remains confused on arrival here but states she is no longer SOB. Denies any overt chest pain. Denies abd pain.   Level 5 caveat invoked as remainder of history, ROS, and physical exam limited due to patient's confusion.     Past Medical History:  Diagnosis Date  . Arthritis   . CAD   . Chronic diastolic CHF (congestive heart failure) ([place])   . CKD (chronic kidney disease), stage IV ([place])   . coronary artery disease   . History of kidney stones 01/2017  . Hypertension   . Hypothyroidism   . Mitral regurgitation and mitral stenosis   . PAF (paroxysmal atrial fibrillation) ([place])    a. On Eliquis; b. CHADS2VASc => 8 (CHF, HTN, age x 2, stroke x 2, vascular disease, female)  . Stroke [facility])    TIA 9/17 & 04/06/2017  . Tricuspid regurgitation     Patient Active Problem List   Diagnosis Date Noted  . SIRS (systemic inflammatory response syndrome) ([place]) 10/10/2019  . Elevated troponin 10/06/2019  . AKI (acute kidney injury) ([place])   . Fracture of  femoral neck, left, closed ([place]) 08/06/2019  . CKD (chronic kidney disease) stage 3, GFR 30-59 ml/min   . History of CVA (cerebrovascular accident)   . Mitral regurgitation and mitral stenosis   . Coronary artery disease involving native coronary artery of native heart without angina pectoris   . Tricuspid regurgitation   . Pulmonary hypertension ([place]) 01/28/2019  . Acute on chronic diastolic (congestive) heart failure ([place]) 07/07/2018  . Chronic diastolic CHF (congestive heart failure) ([place]) 12/11/2017  . Pneumonia 12/05/2017  . S/P total knee arthroplasty, left 11/28/2017  . Bilateral carotid artery stenosis 08/14/2017  . Paroxysmal atrial fibrillation ([place]) 07/14/2017  . Cerebrovascular accident (CVA) ([place]) 06/07/2017  . Osteoarthritis of knee 04/21/2017  . TIA (transient ischemic attack) 10/18/2016  . Hypothyroidism 09/25/2016  . Essential hypertension 09/25/2016  . Knee pain 09/07/2016  . Carotid stenosis 02/17/2014  . Mixed hyperlipidemia 02/17/2014  . Abnormal echocardiogram 02/17/2014    Past Surgical History:  Procedure Laterality Date  . ABDOMINAL HYSTERECTOMY    . CARDIAC CATHETERIZATION     Negative   . COLONOSCOPY  2010  . PARTIAL HYSTERECTOMY    . RIGHT/LEFT HEART CATH AND CORONARY ANGIOGRAPHY N/A 08/02/2019   Procedure: RIGHT/LEFT HEART CATH AND CORONARY ANGIOGRAPHY;  Surgeon: Larey Dresser, MD;  Location: [facility];  Service: Cardiovascular;  Laterality: N/A;  . TEE WITHOUT CARDIOVERSION N/A 08/02/2019   Procedure: TRANSESOPHAGEAL ECHOCARDIOGRAM (TEE);  Surgeon:  Larey Dresser, MD;  Location: [facility] ENDOSCOPY;  Service: Cardiovascular;  Laterality: N/A;  . TONSILLECTOMY    . TOTAL HIP ARTHROPLASTY Left 08/08/2019   Procedure: TOTAL HIP ARTHROPLASTY ANTERIOR APPROACH;  Surgeon: Rod Can, MD;  Location: [facility];  Service: Orthopedics;  Laterality: Left;  . TOTAL KNEE ARTHROPLASTY Left 11/28/2017   Procedure: TOTAL KNEE ARTHROPLASTY;  Surgeon: Thornton Park, MD;  Location: [facility] ORS;  Service: Orthopedics;  Laterality: Left;    Prior to Admission medications   Medication Sig Start Date End Date Taking? Authorizing Provider  acetaminophen (TYLENOL) 500 MG tablet Take 1,000 mg by mouth daily as needed for moderate pain.    Yes [provider]  allopurinol (ZYLOPRIM) 100 MG tablet Take 100 mg by mouth daily.  09/08/16  Yes [provider]  amLODipine (NORVASC) 5 MG tablet Take 1 tablet (5 mg total) by mouth daily. 08/27/19 11/25/19 Yes Larey Dresser, MD  docusate sodium (COLACE) 100 MG capsule Take 1 capsule (100 mg total) by mouth 2 (two) times daily. Patient taking differently: Take 100 mg by mouth daily as needed for moderate constipation.  08/12/19  Yes Hongalgi, Lenis Dickinson, MD  ELIQUIS 5 MG TABS tablet Take 1 tablet by mouth twice daily 05/07/19  Yes Gollan, Kathlene November, MD  furosemide (LASIX) 40 MG tablet Take 1.5 tablets (60 mg total) by mouth 2 (two) times daily. 08/06/19 08/05/20 Yes Larey Dresser, MD  levothyroxine (SYNTHROID, LEVOTHROID) 112 MCG tablet Take 112 mcg by mouth daily before breakfast. 09/05/17  Yes [provider]  metoprolol tartrate (LOPRESSOR) 50 MG tablet Take 1 tablet (50 mg total) by mouth 2 (two) times daily. 01/01/19  Yes Dunn, Areta Haber, PA-C  rosuvastatin (CRESTOR) 20 MG tablet Take 1 tablet (20 mg total) by mouth daily. 08/06/19 11/04/19 Yes Larey Dresser, MD  spironolactone (ALDACTONE) 25 MG tablet Take 0.5 tablets (12.5 mg total) by mouth daily. 08/06/19 11/04/19 Yes Larey Dresser, MD  sildenafil (REVATIO) 20 MG tablet Take 1 tablet (20 mg total) by mouth 3 (three) times daily. Patient not taking: Reported on 10/08/2019 08/27/19   Larey Dresser, MD    Allergies Patient has no known allergies.  Family History  Problem Relation Age of Onset  . Heart attack Father 5  . Hypertension Father   . Hyperlipidemia Father   . Heart disease Brother        valvular disease     Social History  Social History   Tobacco Use  . Smoking status: Never Smoker  . Smokeless tobacco: Never Used  Substance Use Topics  . Alcohol use: Yes    Alcohol/week: 1.0 standard drinks    Types: 1 Glasses of wine per week    Comment: social   . Drug use: No    Review of Systems  Review of Systems   ____________________________________________  PHYSICAL EXAM:      VITAL SIGNS: ED Triage Vitals  Enc Vitals Group     BP 10/25/2019 1700 (!) 93/56     Pulse Rate 10/21/2019 1800 61     Resp 10/15/2019 1700 (!) 23     Temp 09/27/2019 1700 99.1 F (37.3 C)     Temp Source 10/13/2019 1700 Oral     SpO2 10/15/2019 1700 100 %     Weight 10/13/2019 1702 140 lb (63.5 kg)     Height 10/11/2019 1702 5' 2.5" (1.588 m)     Head Circumference --      Peak  Flow --      Pain Score 09/29/2019 1702 0     Pain Loc --      Pain Edu? --      Excl. in [place]? --      Physical Exam Vitals signs and nursing note reviewed.  Constitutional:      General: She is not in acute distress.    Appearance: She is well-developed.  HENT:     Head: Normocephalic and atraumatic.  Eyes:     Conjunctiva/sclera: Conjunctivae normal.  Neck:     Musculoskeletal: Neck supple.  Cardiovascular:     Rate and Rhythm: Normal rate and regular rhythm.     Heart sounds: Murmur present. Systolic murmur present with a grade of 2/6. No friction rub.  Pulmonary:     Effort: Pulmonary effort is normal. Tachypnea present. No respiratory distress.     Breath sounds: Decreased breath sounds present. No wheezing or rales.  Abdominal:     General: There is no distension.     Palpations: Abdomen is soft.     Tenderness: There is no abdominal tenderness.  Skin:    General: Skin is warm.     Capillary Refill: Capillary refill takes less than 2 seconds.  Neurological:     Mental Status: She is alert and oriented to person, place, and time.     Motor: No abnormal muscle tone.       ____________________________________________   LABS (all labs  ordered are listed, but only abnormal results are displayed)  Labs Reviewed  CBC WITH DIFFERENTIAL/PLATELET - Abnormal; Notable for the following components:      Result Value   WBC 18.8 (*)    Neutro Abs 12.3 (*)    Monocytes Absolute 2.7 (*)    Abs Immature Granulocytes 0.11 (*)    All other components within normal limits  COMPREHENSIVE METABOLIC PANEL - Abnormal; Notable for the following components:   Chloride 92 (*)    Glucose, Bld 103 (*)    BUN 29 (*)    Creatinine, Ser 1.66 (*)    Albumin 3.4 (*)    GFR calc non Af Amer 29 (*)    GFR calc Af Amer 34 (*)    All other components within normal limits  URINALYSIS, COMPLETE (UACMP) WITH MICROSCOPIC - Abnormal; Notable for the following components:   Color, Urine YELLOW (*)    APPearance CLEAR (*)    Hgb urine dipstick SMALL (*)    Protein, ur 100 (*)    All other components within normal limits  BRAIN NATRIURETIC PEPTIDE - Abnormal; Notable for the following components:   B Natriuretic Peptide 425.0 (*)    All other components within normal limits  PROTIME-INR - Abnormal; Notable for the following components:   Prothrombin Time 16.2 (*)    INR 1.3 (*)    All other components within normal limits  APTT - Abnormal; Notable for the following components:   aPTT 38 (*)    All other components within normal limits  HEPARIN LEVEL (UNFRACTIONATED) - Abnormal; Notable for the following components:   Heparin Unfractionated 0.25 (*)    All other components within normal limits  TROPONIN I (HIGH SENSITIVITY) - Abnormal; Notable for the following components:   Troponin I (High Sensitivity) 99 (*)    All other components within normal limits  TROPONIN I (HIGH SENSITIVITY) - Abnormal; Notable for the following components:   Troponin I (High Sensitivity) 256 (*)    All other components within normal  limits  SARS CORONAVIRUS 2 BY RT PCR (HOSPITAL ORDER, [facility])  CULTURE, BLOOD (ROUTINE X 2)  CULTURE,  BLOOD (ROUTINE X 2)  URINE CULTURE  LACTIC ACID, PLASMA  LACTIC ACID, PLASMA  INFLUENZA PANEL BY PCR (TYPE A & B)  HEPARIN LEVEL (UNFRACTIONATED)  APTT  VANCOMYCIN, RANDOM    ____________________________________________  EKG: Sinus tachycardia, frequent pACs. VR 148. QTc prolonged at 509. Subtle ST depressions in inferolateral leads, no overt elevations or reciprocal changes.    ________________________________________  RADIOLOGY All imaging, including plain films, CT scans, and ultrasounds, independently reviewed by me, and interpretations confirmed via formal radiology reads.  ED MD interpretation:   CXR: Clear CT Head: [place]  Official radiology report(s): Ct Head Wo Contrast  Result Date: 10/22/2019 CLINICAL DATA:  Altered level of consciousness. EXAM: CT HEAD WITHOUT CONTRAST TECHNIQUE: Contiguous axial images were obtained from the base of the skull through the vertex without intravenous contrast. COMPARISON:  August 06, 2019. FINDINGS: Brain: Mild chronic ischemic white matter disease is noted. No mass effect or midline shift is noted. Ventricular size is within normal limits. There is no evidence of mass lesion, hemorrhage or acute infarction. Vascular: No hyperdense vessel or unexpected calcification. Skull: Normal. Negative for fracture or focal lesion. Sinuses/Orbits: No acute finding. Other: None. IMPRESSION: Mild chronic ischemic white matter disease. No acute intracranial abnormality seen. Electronically Signed   By: Marijo Conception M.D.   On: 10/09/2019 18:02   Dg Chest Portable 1 View  Result Date: 10/07/2019 CLINICAL DATA:  Shortness of breath EXAM: PORTABLE CHEST 1 VIEW COMPARISON:  10/11/2019 FINDINGS: Cardiomegaly. Both lungs are clear. The visualized skeletal structures are unremarkable. IMPRESSION: Cardiomegaly without acute abnormality of the lungs in AP portable projection. Electronically Signed   By: Eddie Candle M.D.   On: 10/13/2019 17:48     ____________________________________________  PROCEDURES   Procedure(s) performed (including Critical Care):  .Critical Care Performed by: Duffy Bruce, MD Authorized by: Duffy Bruce, MD   Critical care provider statement:    Critical care time (minutes):  35   Critical care time was exclusive of:  Separately billable procedures and treating other patients and teaching time   Critical care was necessary to treat or prevent imminent or life-threatening deterioration of the following conditions:  Circulatory failure, cardiac failure and respiratory failure   Critical care was time spent personally by me on the following activities:  Development of treatment plan with patient or surrogate, discussions with consultants, evaluation of patient's response to treatment, examination of patient, obtaining history from patient or surrogate, ordering and performing treatments and interventions, ordering and review of laboratory studies, ordering and review of radiographic studies, pulse oximetry, re-evaluation of patient's condition and review of old charts   I assumed direction of critical care for this patient from another provider in my specialty: no      ____________________________________________  INITIAL IMPRESSION / MDM / [place] / ED COURSE  As part of my medical decision making, I reviewed the following data within the electronic MEDICAL RECORD NUMBER Notes from prior ED visits and [place] Controlled Substance Database      *Caroline [name] was evaluated in Emergency Department on 10/15/2019 for the symptoms described in the history of present illness. She was evaluated in the context of the global COVID-19 pandemic, which necessitated consideration that the patient might be at risk for infection with the SARS-CoV-2 virus that causes COVID-19. Institutional protocols and algorithms that pertain to  the evaluation of patients at risk for COVID-19 are in a state of rapid change based  on information released by regulatory bodies including the CDC and federal and state organizations. These policies and algorithms were followed during the patient's care in the ED.  Some ED evaluations and interventions may be delayed as a result of limited staffing during the pandemic.*      Medical Decision Making:  78 yo F here with confusion, reported SOB and mild hypoxia, though not hypoxic on reassessment here. Of note, pt febrile with moderate leukocytosis here. Unclear source of infection - DDx includes viral URI, COVID, early CAP. No urinary sx and UA is not c/w UTI. Abdomen is soft, NT, ND. No skin rashes or signs of cellulitis. She has known valvular disease but no other risk factors for endocarditis.   Re: her confusion, no focal neuro deficits noted on my exam. CT Head neg. Unlikely CVA given absence of any other neuro deficits. Suspect encephalopathy 2/2 fever, possible underlying infectious process.  Will cover empirically. Pt also noted to have a moderate elevation in troponin, off her anticoagulation. Ddx does include PE for her SOB/jhypoxia/transient confusion, though unable to CT scan 2/2 CKD. Will start heparin, admit.  ____________________________________________  FINAL CLINICAL IMPRESSION(S) / ED DIAGNOSES  Final diagnoses:  Sepsis, due to unspecified organism, unspecified whether acute organ dysfunction present ([place])  Elevated troponin  Transient confusion     MEDICATIONS GIVEN DURING THIS VISIT:  Medications  heparin ADULT infusion 100 units/mL (25000 units/250mL sodium chloride 0.45%) (750 Units/hr Intravenous New Bag/Given 10/06/2019 2344)  ceFEPIme (MAXIPIME) 2 g in sodium chloride 0.9 % 100 mL IVPB (has no administration in time range)  sodium chloride 0.9 % bolus 500 mL (0 mLs Intravenous Stopped 09/28/2019 1946)  ceFEPIme (MAXIPIME) 2 g in sodium chloride 0.9 % 100 mL IVPB (0 g Intravenous Stopped 10/06/2019 1934)  acetaminophen (TYLENOL) tablet 650 mg (650 mg  Oral Given 10/24/2019 1846)  vancomycin (VANCOCIN) 1,500 mg in sodium chloride 0.9 % 500 mL IVPB (0 mg Intravenous Stopped 10/18/2019 2147)  heparin bolus via infusion 3,800 Units (3,800 Units Intravenous Bolus from Bag 10/18/2019 2337)     ED Discharge Orders    None       Note:  This document was prepared using Dragon voice recognition software and may include unintentional dictation errors.   Duffy Bruce, MD 10/15/19 [phone]

## 2019-10-14 NOTE — Consult Note (Signed)
PHARMACY -  BRIEF ANTIBIOTIC NOTE   Pharmacy has received consult(s) for Cefepime and Vancomycin  from an ED provider.  The patient's profile has been reviewed for ht/wt/allergies/indication/available labs.    One time order(s) placed for Cefepime 2g x1 and Vancomycin 1.5g x1 dose.   Further antibiotics/pharmacy consults should be ordered by admitting physician if indicated.                       Thank you, Rowland Lathe 10/26/2019  6:42 PM

## 2019-10-15 ENCOUNTER — Inpatient Hospital Stay: Payer: Medicare Other

## 2019-10-15 ENCOUNTER — Other Ambulatory Visit: Payer: Self-pay | Admitting: *Deleted

## 2019-10-15 ENCOUNTER — Inpatient Hospital Stay (HOSPITAL_COMMUNITY)
Admit: 2019-10-15 | Discharge: 2019-10-15 | Disposition: A | Discharge status: Home / Self Care | Payer: Medicare Other | Attending: Internal Medicine | Admitting: Internal Medicine

## 2019-10-15 ENCOUNTER — Inpatient Hospital Stay (HOSPITAL_COMMUNITY)
Admission: RE | Admit: 2019-10-15 | Payer: Medicare Other | Source: Home / Self Care | Admitting: Thoracic Surgery (Cardiothoracic Vascular Surgery)

## 2019-10-15 ENCOUNTER — Encounter (HOSPITAL_COMMUNITY): Admission: RE | Payer: Self-pay | Source: Home / Self Care

## 2019-10-15 ENCOUNTER — Other Ambulatory Visit: Payer: Self-pay

## 2019-10-15 DIAGNOSIS — I2510 Atherosclerotic heart disease of native coronary artery without angina pectoris: Secondary | ICD-10-CM

## 2019-10-15 DIAGNOSIS — I469 Cardiac arrest, cause unspecified: Secondary | ICD-10-CM | POA: Diagnosis not present

## 2019-10-15 DIAGNOSIS — Z23 Encounter for immunization: Secondary | ICD-10-CM | POA: Diagnosis not present

## 2019-10-15 DIAGNOSIS — I130 Hypertensive heart and chronic kidney disease with heart failure and stage 1 through stage 4 chronic kidney disease, or unspecified chronic kidney disease: Secondary | ICD-10-CM | POA: Diagnosis present

## 2019-10-15 DIAGNOSIS — E782 Mixed hyperlipidemia: Secondary | ICD-10-CM | POA: Diagnosis present

## 2019-10-15 DIAGNOSIS — R6510 Systemic inflammatory response syndrome (SIRS) of non-infectious origin without acute organ dysfunction: Secondary | ICD-10-CM | POA: Diagnosis not present

## 2019-10-15 DIAGNOSIS — I480 Paroxysmal atrial fibrillation: Secondary | ICD-10-CM

## 2019-10-15 DIAGNOSIS — I342 Nonrheumatic mitral (valve) stenosis: Secondary | ICD-10-CM | POA: Diagnosis not present

## 2019-10-15 DIAGNOSIS — Z8673 Personal history of transient ischemic attack (TIA), and cerebral infarction without residual deficits: Secondary | ICD-10-CM | POA: Diagnosis not present

## 2019-10-15 DIAGNOSIS — I4901 Ventricular fibrillation: Secondary | ICD-10-CM | POA: Diagnosis not present

## 2019-10-15 DIAGNOSIS — I5032 Chronic diastolic (congestive) heart failure: Secondary | ICD-10-CM

## 2019-10-15 DIAGNOSIS — I959 Hypotension, unspecified: Secondary | ICD-10-CM | POA: Diagnosis present

## 2019-10-15 DIAGNOSIS — Z20828 Contact with and (suspected) exposure to other viral communicable diseases: Secondary | ICD-10-CM | POA: Diagnosis present

## 2019-10-15 DIAGNOSIS — E876 Hypokalemia: Secondary | ICD-10-CM | POA: Diagnosis not present

## 2019-10-15 DIAGNOSIS — I340 Nonrheumatic mitral (valve) insufficiency: Secondary | ICD-10-CM | POA: Diagnosis not present

## 2019-10-15 DIAGNOSIS — I2721 Secondary pulmonary arterial hypertension: Secondary | ICD-10-CM | POA: Diagnosis present

## 2019-10-15 DIAGNOSIS — N184 Chronic kidney disease, stage 4 (severe): Secondary | ICD-10-CM | POA: Diagnosis present

## 2019-10-15 DIAGNOSIS — J189 Pneumonia, unspecified organism: Secondary | ICD-10-CM | POA: Diagnosis present

## 2019-10-15 DIAGNOSIS — I471 Supraventricular tachycardia: Secondary | ICD-10-CM | POA: Diagnosis not present

## 2019-10-15 DIAGNOSIS — Z87442 Personal history of urinary calculi: Secondary | ICD-10-CM | POA: Diagnosis not present

## 2019-10-15 DIAGNOSIS — R7989 Other specified abnormal findings of blood chemistry: Secondary | ICD-10-CM | POA: Diagnosis present

## 2019-10-15 DIAGNOSIS — J9601 Acute respiratory failure with hypoxia: Secondary | ICD-10-CM | POA: Diagnosis not present

## 2019-10-15 DIAGNOSIS — I462 Cardiac arrest due to underlying cardiac condition: Secondary | ICD-10-CM | POA: Diagnosis not present

## 2019-10-15 DIAGNOSIS — I739 Peripheral vascular disease, unspecified: Secondary | ICD-10-CM | POA: Diagnosis present

## 2019-10-15 DIAGNOSIS — E039 Hypothyroidism, unspecified: Secondary | ICD-10-CM | POA: Diagnosis present

## 2019-10-15 DIAGNOSIS — A419 Sepsis, unspecified organism: Secondary | ICD-10-CM | POA: Diagnosis present

## 2019-10-15 DIAGNOSIS — J9602 Acute respiratory failure with hypercapnia: Secondary | ICD-10-CM | POA: Diagnosis not present

## 2019-10-15 DIAGNOSIS — I081 Rheumatic disorders of both mitral and tricuspid valves: Secondary | ICD-10-CM | POA: Diagnosis present

## 2019-10-15 LAB — URINE CULTURE: Culture: NO GROWTH

## 2019-10-15 LAB — RESPIRATORY PANEL BY PCR

## 2019-10-15 LAB — BASIC METABOLIC PANEL
Anion gap: 12 (ref 5–15)
BUN: 25 mg/dL — ABNORMAL HIGH (ref 8–23)
CO2: 28 mmol/L (ref 22–32)
Calcium: 8.7 mg/dL — ABNORMAL LOW (ref 8.9–10.3)
Chloride: 98 mmol/L (ref 98–111)
Creatinine, Ser: 1.45 mg/dL — ABNORMAL HIGH (ref 0.44–1.00)
GFR calc Af Amer: 40 mL/min — ABNORMAL LOW (ref >60)
GFR calc non Af Amer: 34 mL/min — ABNORMAL LOW (ref >60)
Glucose, Bld: 116 mg/dL — ABNORMAL HIGH (ref 70–99)
Potassium: 2.8 mmol/L — ABNORMAL LOW (ref 3.5–5.1)
Sodium: 138 mmol/L (ref 135–145)

## 2019-10-15 LAB — CBC
HCT: 39.8 % (ref 36.0–46.0)
Hemoglobin: 13.2 g/dL (ref 12.0–15.0)
MCH: 32.6 pg (ref 26.0–34.0)
MCHC: 33.2 g/dL (ref 30.0–36.0)
MCV: 98.3 fL (ref 80.0–100.0)
Platelets: 251 10*3/uL (ref 150–400)
RBC: 4.05 MIL/uL (ref 3.87–5.11)
RDW: 13.6 % (ref 11.5–15.5)
WBC: 16.5 10*3/uL — ABNORMAL HIGH (ref 4.0–10.5)
nRBC: 0 % (ref 0.0–0.2)

## 2019-10-15 LAB — PROCALCITONIN: Procalcitonin: 0.28 ng/mL

## 2019-10-15 LAB — MAGNESIUM: Magnesium: 2.6 mg/dL — ABNORMAL HIGH (ref 1.7–2.4)

## 2019-10-15 LAB — INFLUENZA PANEL BY PCR (TYPE A & B)
Influenza A By PCR: NEGATIVE
Influenza B By PCR: NEGATIVE

## 2019-10-15 LAB — APTT
aPTT: 64 seconds — ABNORMAL HIGH (ref 24–36)
aPTT: 55 seconds — ABNORMAL HIGH (ref 24–36)

## 2019-10-15 LAB — ECHOCARDIOGRAM COMPLETE
Height: 62.5 in
Weight: 2443.2 oz

## 2019-10-15 LAB — HEPARIN LEVEL (UNFRACTIONATED)
Heparin Unfractionated: 0.44 IU/mL (ref 0.30–0.70)
Heparin Unfractionated: 0.33 IU/mL (ref 0.30–0.70)

## 2019-10-15 LAB — TROPONIN I (HIGH SENSITIVITY)
Troponin I (High Sensitivity): 473 ng/L (ref <18)
Troponin I (High Sensitivity): 399 ng/L (ref <18)

## 2019-10-15 LAB — VANCOMYCIN, RANDOM: Vancomycin Rm: 18

## 2019-10-15 SURGERY — REPAIR, MITRAL VALVE
Anesthesia: General | Site: Chest

## 2019-10-15 MED ORDER — VANCOMYCIN HCL IN DEXTROSE 1-5 GM/200ML-% IV SOLN
1000.0000 mg | Freq: Once | INTRAVENOUS | Status: DC
  Filled 2019-10-15: qty 200

## 2019-10-15 MED ORDER — SENNOSIDES-DOCUSATE SODIUM 8.6-50 MG PO TABS
1.0000 | ORAL_TABLET | Freq: Every morning | ORAL | Status: DC
  Administered 2019-10-15: 20:00:00 1 via ORAL
  Filled 2019-10-15: qty 1

## 2019-10-15 MED ORDER — VANCOMYCIN HCL IN DEXTROSE 1-5 GM/200ML-% IV SOLN
1000.0000 mg | Freq: Once | INTRAVENOUS | Status: AC
  Administered 2019-10-15: 1000 mg via INTRAVENOUS
  Filled 2019-10-15: qty 200

## 2019-10-15 MED ORDER — ONDANSETRON HCL 4 MG PO TABS: 4.0000 mg | ORAL_TABLET | Freq: Four times a day (QID) | ORAL | Status: DC | PRN

## 2019-10-15 MED ORDER — ACETAMINOPHEN 650 MG RE SUPP: 650.0000 mg | Freq: Four times a day (QID) | RECTAL | Status: DC | PRN

## 2019-10-15 MED ORDER — INFLUENZA VAC A&B SA ADJ QUAD 0.5 ML IM PRSY
0.5000 mL | PREFILLED_SYRINGE | INTRAMUSCULAR | Status: DC
  Filled 2019-10-15: qty 0.5

## 2019-10-15 MED ORDER — DILTIAZEM HCL 25 MG/5ML IV SOLN
10.0000 mg | Freq: Once | INTRAVENOUS | Status: AC
  Administered 2019-10-15: 10 mg via INTRAVENOUS
  Filled 2019-10-15: qty 5

## 2019-10-15 MED ORDER — METOPROLOL TARTRATE 25 MG PO TABS
25.0000 mg | ORAL_TABLET | Freq: Two times a day (BID) | ORAL | Status: DC
  Administered 2019-10-15: 25 mg via ORAL
  Filled 2019-10-15: qty 1

## 2019-10-15 MED ORDER — ROSUVASTATIN CALCIUM 10 MG PO TABS
20.0000 mg | ORAL_TABLET | Freq: Every day | ORAL | Status: DC
  Administered 2019-10-15: 20 mg via ORAL
  Filled 2019-10-15: qty 2

## 2019-10-15 MED ORDER — SODIUM CHLORIDE 0.9 % IV SOLN
2.0000 g | INTRAVENOUS | Status: DC
  Administered 2019-10-15: 2 g via INTRAVENOUS
  Filled 2019-10-15 (×2): qty 2

## 2019-10-15 MED ORDER — POTASSIUM CHLORIDE CRYS ER 20 MEQ PO TBCR
40.0000 meq | EXTENDED_RELEASE_TABLET | Freq: Two times a day (BID) | ORAL | Status: DC
  Administered 2019-10-15: 40 meq via ORAL
  Filled 2019-10-15: qty 2

## 2019-10-15 MED ORDER — ONDANSETRON HCL 4 MG/2ML IJ SOLN: 4.0000 mg | Freq: Four times a day (QID) | INTRAMUSCULAR | Status: DC | PRN

## 2019-10-15 MED ORDER — LEVOTHYROXINE SODIUM 112 MCG PO TABS
112.0000 ug | ORAL_TABLET | Freq: Every day | ORAL | Status: DC
  Administered 2019-10-15: 112 ug via ORAL
  Filled 2019-10-15 (×3): qty 1

## 2019-10-15 MED ORDER — TECHNETIUM TC 99M DIETHYLENETRIAME-PENTAACETIC ACID
30.0000 | Freq: Once | INTRAVENOUS | Status: AC | PRN
  Administered 2019-10-15: 13:00:00 32.4 via INTRAVENOUS

## 2019-10-15 MED ORDER — TECHNETIUM TO 99M ALBUMIN AGGREGATED
4.0000 | Freq: Once | INTRAVENOUS | Status: AC | PRN
  Administered 2019-10-15: 4.45 via INTRAVENOUS

## 2019-10-15 MED ORDER — POLYETHYLENE GLYCOL 3350 17 G PO PACK
17.0000 g | PACK | Freq: Every day | ORAL | Status: DC | PRN
  Administered 2019-10-15: 17 g via ORAL
  Filled 2019-10-15: qty 1

## 2019-10-15 MED ORDER — POTASSIUM CHLORIDE CRYS ER 20 MEQ PO TBCR
40.0000 meq | EXTENDED_RELEASE_TABLET | Freq: Two times a day (BID) | ORAL | Status: AC
  Administered 2019-10-15: 40 meq via ORAL
  Filled 2019-10-15: qty 2

## 2019-10-15 MED ORDER — ACETAMINOPHEN 325 MG PO TABS
650.0000 mg | ORAL_TABLET | Freq: Four times a day (QID) | ORAL | Status: DC | PRN
  Administered 2019-10-15 – 2019-10-16 (×2): 650 mg via ORAL
  Filled 2019-10-15 (×2): qty 2

## 2019-10-15 NOTE — H&P (Signed)
Caroline [name] at [place] NAME: Caroline [name]    MR#:  [id number]  DATE OF BIRTH:  06-01-1941  DATE OF ADMISSION:  10/04/2019  PRIMARY CARE PHYSICIAN: Caroline Haggard, FNP   REQUESTING/REFERRING PHYSICIAN: Ellender Hose, MD  CHIEF COMPLAINT:   Chief Complaint  Patient presents with  . Shortness of Breath    HISTORY OF PRESENT ILLNESS:  Caroline [name]  is a 78 y.o. female who presents with chief complaint as above.  Patient presents to the ED after an episode of confusion with a complaint of some mild shortness of breath.  She was supposed to have some heart valve surgery tomorrow, but on evaluation today in the ED was found to be initially very mildly hypoxic.  Family stated that she was somewhat confused at home earlier, though she seems to have returned to baseline mentation at this point.  Patient states she is feeling much better.  She was febrile on admission to the ED as well.  Her white blood cell count is elevated.  However, no clear source of infection was elucidated in work-up.  She did stop taking her Eliquis in anticipation of this procedure, and there is some concern for possible thrombus somewhere, or perhaps TIA or stroke event.  Hospitalist were called for further evaluation and treatment  PAST MEDICAL HISTORY:   Past Medical History:  Diagnosis Date  . Arthritis   . CAD   . Chronic diastolic CHF (congestive heart failure) ([place])   . CKD (chronic kidney disease), stage IV ([place])   . coronary artery disease   . History of kidney stones 01/2017  . Hypertension   . Hypothyroidism   . Mitral regurgitation and mitral stenosis   . PAF (paroxysmal atrial fibrillation) ([place])    a. On Eliquis; b. CHADS2VASc => 8 (CHF, HTN, age x 2, stroke x 2, vascular disease, female)  . Stroke [facility])    TIA 9/17 & 04/06/2017  . Tricuspid regurgitation      PAST SURGICAL HISTORY:   Past Surgical History:  Procedure Laterality Date  . ABDOMINAL  HYSTERECTOMY    . CARDIAC CATHETERIZATION     Negative   . COLONOSCOPY  2010  . PARTIAL HYSTERECTOMY    . RIGHT/LEFT HEART CATH AND CORONARY ANGIOGRAPHY N/A 08/02/2019   Procedure: RIGHT/LEFT HEART CATH AND CORONARY ANGIOGRAPHY;  Surgeon: Larey Dresser, MD;  Location: [facility];  Service: Cardiovascular;  Laterality: N/A;  . TEE WITHOUT CARDIOVERSION N/A 08/02/2019   Procedure: TRANSESOPHAGEAL ECHOCARDIOGRAM (TEE);  Surgeon: Larey Dresser, MD;  Location[facility] ENDOSCOPY;  Service: Cardiovascular;  Laterality: N/A;  . TONSILLECTOMY    . TOTAL HIP ARTHROPLASTY Left 08/08/2019   Procedure: TOTAL HIP ARTHROPLASTY ANTERIOR APPROACH;  Surgeon: Rod Can, MD;  Location: [facility];  Service: Orthopedics;  Laterality: Left;  . TOTAL KNEE ARTHROPLASTY Left 11/28/2017   Procedure: TOTAL KNEE ARTHROPLASTY;  Surgeon: Thornton Park, MD;  Location: [facility] ORS;  Service: Orthopedics;  Laterality: Left;     SOCIAL HISTORY:   Social History   Tobacco Use  . Smoking status: Never Smoker  . Smokeless tobacco: Never Used  Substance Use Topics  . Alcohol use: Yes    Alcohol/week: 1.0 standard drinks    Types: 1 Glasses of wine per week    Comment: social      FAMILY HISTORY:   Family History  Problem Relation Age of Onset  . Heart attack Father 57  . Hypertension Father   .  Hyperlipidemia Father   . Heart disease Brother        valvular disease      DRUG ALLERGIES:  No Known Allergies  MEDICATIONS AT HOME:   Prior to Admission medications   Medication Sig Start Date End Date Taking? Authorizing Provider  acetaminophen (TYLENOL) 500 MG tablet Take 1,000 mg by mouth daily as needed for moderate pain.    Yes [provider]  allopurinol (ZYLOPRIM) 100 MG tablet Take 100 mg by mouth daily.  09/08/16  Yes [provider]  amLODipine (NORVASC) 5 MG tablet Take 1 tablet (5 mg total) by mouth daily. 08/27/19 11/25/19 Yes Larey Dresser, MD  docusate sodium (COLACE)  100 MG capsule Take 1 capsule (100 mg total) by mouth 2 (two) times daily. Patient taking differently: Take 100 mg by mouth daily as needed for moderate constipation.  08/12/19  Yes Hongalgi, Lenis Dickinson, MD  ELIQUIS 5 MG TABS tablet Take 1 tablet by mouth twice daily 05/07/19  Yes Gollan, Kathlene November, MD  furosemide (LASIX) 40 MG tablet Take 1.5 tablets (60 mg total) by mouth 2 (two) times daily. 08/06/19 08/05/20 Yes Larey Dresser, MD  levothyroxine (SYNTHROID, LEVOTHROID) 112 MCG tablet Take 112 mcg by mouth daily before breakfast. 09/05/17  Yes [provider]  metoprolol tartrate (LOPRESSOR) 50 MG tablet Take 1 tablet (50 mg total) by mouth 2 (two) times daily. 01/01/19  Yes Dunn, Areta Haber, PA-C  rosuvastatin (CRESTOR) 20 MG tablet Take 1 tablet (20 mg total) by mouth daily. 08/06/19 11/04/19 Yes Larey Dresser, MD  spironolactone (ALDACTONE) 25 MG tablet Take 0.5 tablets (12.5 mg total) by mouth daily. 08/06/19 11/04/19 Yes Larey Dresser, MD  sildenafil (REVATIO) 20 MG tablet Take 1 tablet (20 mg total) by mouth 3 (three) times daily. Patient not taking: Reported on 10/08/2019 08/27/19   Larey Dresser, MD    REVIEW OF SYSTEMS:  Review of Systems  Constitutional: Positive for fever and malaise/fatigue. Negative for chills and weight loss.  HENT: Negative for ear pain, hearing loss and tinnitus.   Eyes: Negative for blurred vision, double vision, pain and redness.  Respiratory: Positive for shortness of breath. Negative for cough and hemoptysis.   Cardiovascular: Negative for chest pain, palpitations, orthopnea and leg swelling.  Gastrointestinal: Negative for abdominal pain, constipation, diarrhea, nausea and vomiting.  Genitourinary: Negative for dysuria, frequency and hematuria.  Musculoskeletal: Negative for back pain, joint pain and neck pain.  Skin:       No acne, rash, or lesions  Neurological: Negative for dizziness, tremors, focal weakness and weakness.       Transient confusion   Endo/Heme/Allergies: Negative for polydipsia. Does not bruise/bleed easily.  Psychiatric/Behavioral: Negative for depression. The patient is not nervous/anxious and does not have insomnia.      VITAL SIGNS:   Vitals:   09/28/2019 2130 10/10/2019 2246 10/01/2019 2300 10/15/19 0000  BP: (!) 95/49 104/67 (!) 103/37 (!) 117/58  Pulse: 72 70 72 64  Resp: (!) 23 17 (!) 27 (!) 25  Temp:      TempSrc:      SpO2: 99% 95% 99% 92%  Weight:      Height:       Wt Readings from Last 3 Encounters:  09/26/2019 63.5 kg  10/11/19 67.1 kg  10/11/19 67.1 kg    PHYSICAL EXAMINATION:  Physical Exam  Vitals reviewed. Constitutional: She is oriented to person, place, and time. She appears well-developed and well-nourished. No distress.  HENT:  Head: Normocephalic and atraumatic.  Mouth/Throat: Oropharynx is clear and moist.  Eyes: Pupils are equal, round, and reactive to light. Conjunctivae and EOM are normal. No scleral icterus.  Neck: Normal range of motion. Neck supple. No JVD present. No thyromegaly present.  Cardiovascular: Normal rate, regular rhythm and intact distal pulses. Exam reveals no gallop and no friction rub.  No murmur heard. Respiratory: Effort normal and breath sounds normal. No respiratory distress. She has no wheezes. She has no rales.  GI: Soft. Bowel sounds are normal. She exhibits no distension. There is no abdominal tenderness.  Musculoskeletal: Normal range of motion.        General: No edema.     Comments: No arthritis, no gout  Lymphadenopathy:    She has no cervical adenopathy.  Neurological: She is alert and oriented to person, place, and time. No cranial nerve deficit.  No dysarthria, no aphasia  Skin: Skin is warm and dry. No rash noted. No erythema.  Psychiatric: She has a normal mood and affect. Her behavior is normal. Judgment and thought content normal.    LABORATORY PANEL:   CBC Recent Labs  Lab 10/08/2019 1732  WBC 18.8*  HGB 14.0  HCT 42.1  PLT 302    ------------------------------------------------------------------------------------------------------------------  Chemistries  Recent Labs  Lab 10/20/2019 1732  NA 135  K 4.0  CL 92*  CO2 28  GLUCOSE 103*  BUN 29*  CREATININE 1.66*  CALCIUM 9.1  AST 27  ALT 15  ALKPHOS 98  BILITOT 1.0   ------------------------------------------------------------------------------------------------------------------  Cardiac Enzymes No results for input(s): TROPONINI in the last 168 hours. ------------------------------------------------------------------------------------------------------------------  RADIOLOGY:  Ct Head Wo Contrast  Result Date: 09/27/2019 CLINICAL DATA:  Altered level of consciousness. EXAM: CT HEAD WITHOUT CONTRAST TECHNIQUE: Contiguous axial images were obtained from the base of the skull through the vertex without intravenous contrast. COMPARISON:  August 06, 2019. FINDINGS: Brain: Mild chronic ischemic white matter disease is noted. No mass effect or midline shift is noted. Ventricular size is within normal limits. There is no evidence of mass lesion, hemorrhage or acute infarction. Vascular: No hyperdense vessel or unexpected calcification. Skull: Normal. Negative for fracture or focal lesion. Sinuses/Orbits: No acute finding. Other: None. IMPRESSION: Mild chronic ischemic white matter disease. No acute intracranial abnormality seen. Electronically Signed   By: Marijo Conception M.D.   On: 10/26/2019 18:02   Dg Chest Portable 1 View  Result Date: 10/13/2019 CLINICAL DATA:  Shortness of breath EXAM: PORTABLE CHEST 1 VIEW COMPARISON:  10/11/2019 FINDINGS: Cardiomegaly. Both lungs are clear. The visualized skeletal structures are unremarkable. IMPRESSION: Cardiomegaly without acute abnormality of the lungs in AP portable projection. Electronically Signed   By: Eddie Candle M.D.   On: 10/24/2019 17:48    EKG:   Orders placed or performed during the hospital encounter of  09/26/2019  . ED EKG 12-Lead  . ED EKG 12-Lead    IMPRESSION AND PLAN:  Principal Problem:   SIRS (systemic inflammatory response syndrome) (HCC) -lactic acid within normal limits, blood pressure stable, IV antibiotics given, cultures sent, unclear source of infection Active Problems:   Elevated troponin -mildly elevated, she was started on heparin drip by ED physician given the overall concern for possibility of a thrombus somewhere.  She was mildly hypoxic, and does not appear clinically consistent with diagnosis of PE, but given her renal failure was unable to be scanned.  With her elevated troponin it was felt that it was better to start a heparin  drip upfront.   Paroxysmal atrial fibrillation (HCC) -continue home meds   Chronic diastolic CHF (congestive heart failure) ([place]) -continue home medications   Essential hypertension -home dose antihypertensives   Mixed hyperlipidemia -home dose antilipid   Hypothyroidism -home dose thyroid replacement   CKD (chronic kidney disease) stage 3, GFR 30-59 ml/min -at baseline, prohibitive for any sort of contrasted imaging, avoid nephrotoxins and monitor  Chart review performed and case discussed with ED provider. Labs, imaging and/or ECG reviewed by provider and discussed with patient/family. Management plans discussed with the patient and/or family.  COVID-19 status: Tested negative     DVT PROPHYLAXIS: Systemic anticoagulation  GI PROPHYLAXIS:  None  ADMISSION STATUS: Inpatient     CODE STATUS: Full Code Status History    Date Active Date Inactive Code Status Order ID Comments User Context   08/06/2019 1940 08/12/2019 1828 Full Code [id number]  Lenore Cordia, MD ED   08/02/2019 1452 08/02/2019 2015 Full Code [id number]  Larey Dresser, MD Inpatient   12/11/2017 1906 12/15/2017 1813 Full Code [id number]  Gladstone Lighter, MD Inpatient   12/05/2017 0711 12/08/2017 1858 Full Code [id number]  Saundra Shelling, MD Inpatient   11/28/2017 1237  12/01/2017 1927 Full Code [id number]  Thornton Park, MD Inpatient   06/06/2017 1802 06/07/2017 1812 Full Code [id number]  Nicholes Mango, MD ED   Advance Care Planning Activity    Advance Directive Documentation     Most Recent Value  Type of Advance Directive  Living will  Pre-existing out of facility DNR order (yellow form or pink MOST form)  -  "MOST" Form in Place?  -      TOTAL TIME TAKING CARE OF THIS PATIENT: 45 minutes.   This patient was evaluated in the context of the global COVID-19 pandemic, which necessitated consideration that the patient might be at risk for infection with the SARS-CoV-2 virus that causes COVID-19. Institutional protocols and algorithms that pertain to the evaluation of patients at risk for COVID-19 are in a state of rapid change based on information released by regulatory bodies including the CDC and federal and state organizations. These policies and algorithms were followed to the best of this provider's knowledge to date during the patient's care at this facility.  Ethlyn Daniels 10/15/2019, 12:16 AM  [facility]  Office  [phone]  CC: Primary care physician; Caroline Haggard, FNP  Note:  This document was prepared using Dragon voice recognition software and may include unintentional dictation errors.

## 2019-10-15 NOTE — Progress Notes (Signed)
*  PRELIMINARY RESULTS* Echocardiogram 2D Echocardiogram has been performed.  Caroline [name] 10/15/2019, 9:37 AM

## 2019-10-15 NOTE — Consult Note (Signed)
Cardiology Consultation:   Patient ID: Caroline [name] MRN: [id number]; DOB: 1941/04/14  Admit date: 10/15/2019 Date of Consult: 10/15/2019  Primary Care Provider: Remi Haggard, FNP Primary Cardiologist: Ida Rogue, MD  Primary Electrophysiologist:  None    Patient Profile:   Caroline [name] is a 78 y.o. female with a hx of chronic diastolic CHF, known 3v CAD by 07/2019 cath, mitral valve disease, paroxysmal atrial fibrillation, CKD stage III, and PAD who is being seen today for the evaluation of elevated troponin at the request of Dr. Jannifer Franklin.  History of Present Illness:   Caroline [name] is a 78 year old female with history of chronic diastolic CHF, mitral valve disease, paroxysmal atrial fibrillation, CKD stage III, and PAD.   She has no known history of rheumatic fever.  She has paroxysmal atrial fibrillation but generally NSR.  She also has home oxygen, which she uses on a as needed basis.  She has never smoked. She is noted to have moderate PAD by ABIs.    Echo 1/20 showed normal LVEF with mild LVH, normal RV with moderate pulmonary hypertension (PASP 60 mmHg), and mild to moderate mitral stenosis. She had a TEE in 07/2019, showing EF 55 to 60%, moderate TR, and rheumatic appearing mitral valve with severe mitral regurgitation and mild mitral stenosis.  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  She was seen by Dr. Roxy Manns for evaluation for MVR/CABG.    When seen 9/22, she had her diuretic dose increased.  Unfortunately, she had recently had a mechanical fall with left femoral neck fracture.  She had surgical repair 8/20.  She had perioperative transient hypotension and AKI that resolved.  Lisinopril was stopped.  She returned for follow-up on 9/22 and had done well since her surgery.  She was getting home PT twice a week and  almost finished.  She was walking on her own.  She was using her walker with longer distances but generally back to walking on her own.  She denied dyspnea, unless going for a long walk.  Her weight was stable.  No chest pain or orthopnea.  Her BP was well controlled.  Since that time, she had reportedly still been recovering well from her recent hip surgery, stating she was able to walk at least 200 feet without becoming acutely short of breath and without a walker.  She was only using a cane for assistance, just in case she fell.    On 10/17/2019, the patient reportedly felt acutely short of breath while getting ready to go "take a drive" before her scheduled MVR.  She became acutely SOB and slumped over the kitchen counter. Associated symptoms included nausea without emesis. No chest pain, racing heart rate, or palpitations with this shortness of breath. No s/sx of bleeding. She was not taking her [place], in preparation for her upcoming surgery with last dose estimated approximately 1 week earlier. Per her husband, the patient started to rub her forehead after the SOB episode; however, the patient herself does not remember the events following her slumping over the kitchen counter due to her shortness of breath.  She does not remember anything that occurred after that time and until seen in the emergency department.  Her husband expressed his concern that this was very similar to when she has had her 2 TIAs in the past.    In the emergency department, she  was noted to be febrile with elevated white blood cell count; however, "no clear source of infection;" therefore, she was not started on antibiotic.  Vitals were significant for hypotension with BP 93/56, heart rate 61 bpm, respiration rate 23, temperature 99.32F, 100% ORA.  Initial COVID-19 negative.  ED labs significant for potassium 4.0, sodium 135, creatinine 1.66, BUN 29, AST 27, ALT 15, albumin 3.4, WBC 18.8, hemoglobin 14.0, RBC 4.41. HS Tn 256. BNP  425.0. EKG without acute changes.  Chest x-ray and CT head without acute changes.  At the time of cardiac consultation, she denied CP and was well oriented.  Heart Pathway Score:     Past Medical History:  Diagnosis Date  . Arthritis   . CAD   . Chronic diastolic CHF (congestive heart failure) ([place])   . CKD (chronic kidney disease), stage IV ([place])   . coronary artery disease   . History of kidney stones 01/2017  . Hypertension   . Hypothyroidism   . Mitral regurgitation and mitral stenosis   . PAF (paroxysmal atrial fibrillation) ([place])    a. On Eliquis; b. CHADS2VASc => 8 (CHF, HTN, age x 2, stroke x 2, vascular disease, female)  . Stroke [facility])    TIA 9/17 & 04/06/2017  . Tricuspid regurgitation     Past Surgical History:  Procedure Laterality Date  . ABDOMINAL HYSTERECTOMY    . CARDIAC CATHETERIZATION     Negative   . COLONOSCOPY  2010  . PARTIAL HYSTERECTOMY    . RIGHT/LEFT HEART CATH AND CORONARY ANGIOGRAPHY N/A 08/02/2019   Procedure: RIGHT/LEFT HEART CATH AND CORONARY ANGIOGRAPHY;  Surgeon: Larey Dresser, MD;  Location: [facility];  Service: Cardiovascular;  Laterality: N/A;  . TEE WITHOUT CARDIOVERSION N/A 08/02/2019   Procedure: TRANSESOPHAGEAL ECHOCARDIOGRAM (TEE);  Surgeon: Larey Dresser, MD;  Location[facility] ENDOSCOPY;  Service: Cardiovascular;  Laterality: N/A;  . TONSILLECTOMY    . TOTAL HIP ARTHROPLASTY Left 08/08/2019   Procedure: TOTAL HIP ARTHROPLASTY ANTERIOR APPROACH;  Surgeon: Rod Can, MD;  Location: [facility];  Service: Orthopedics;  Laterality: Left;  . TOTAL KNEE ARTHROPLASTY Left 11/28/2017   Procedure: TOTAL KNEE ARTHROPLASTY;  Surgeon: Thornton Park, MD;  Location: [facility] ORS;  Service: Orthopedics;  Laterality: Left;     Home Medications:  Prior to Admission medications   Medication Sig Start Date End Date Taking? Authorizing Provider  acetaminophen (TYLENOL) 500 MG tablet Take 1,000 mg by mouth daily as needed for moderate pain.    Yes  [provider]  allopurinol (ZYLOPRIM) 100 MG tablet Take 100 mg by mouth daily.  09/08/16  Yes [provider]  amLODipine (NORVASC) 5 MG tablet Take 1 tablet (5 mg total) by mouth daily. 08/27/19 11/25/19 Yes Larey Dresser, MD  docusate sodium (COLACE) 100 MG capsule Take 1 capsule (100 mg total) by mouth 2 (two) times daily. Patient taking differently: Take 100 mg by mouth daily as needed for moderate constipation.  08/12/19  Yes Hongalgi, Lenis Dickinson, MD  ELIQUIS 5 MG TABS tablet Take 1 tablet by mouth twice daily 05/07/19  Yes Gollan, Kathlene November, MD  furosemide (LASIX) 40 MG tablet Take 1.5 tablets (60 mg total) by mouth 2 (two) times daily. 08/06/19 08/05/20 Yes Larey Dresser, MD  levothyroxine (SYNTHROID, LEVOTHROID) 112 MCG tablet Take 112 mcg by mouth daily before breakfast. 09/05/17  Yes [provider]  metoprolol tartrate (LOPRESSOR) 50 MG tablet Take 1 tablet (50 mg total) by mouth 2 (two) times  daily. 01/01/19  Yes Dunn, Areta Haber, PA-C  rosuvastatin (CRESTOR) 20 MG tablet Take 1 tablet (20 mg total) by mouth daily. 08/06/19 11/04/19 Yes Larey Dresser, MD  spironolactone (ALDACTONE) 25 MG tablet Take 0.5 tablets (12.5 mg total) by mouth daily. 08/06/19 11/04/19 Yes Larey Dresser, MD  sildenafil (REVATIO) 20 MG tablet Take 1 tablet (20 mg total) by mouth 3 (three) times daily. Patient not taking: Reported on 10/08/2019 08/27/19   Larey Dresser, MD    Inpatient Medications: Scheduled Meds: . [START ON 2019/10/21] influenza vaccine adjuvanted  0.5 mL Intramuscular Tomorrow-1000  . levothyroxine  112 mcg Oral QAC breakfast  . potassium chloride  40 mEq Oral BID  . rosuvastatin  20 mg Oral Daily   Continuous Infusions: . ceFEPime (MAXIPIME) IV    . heparin 750 Units/hr (10/13/2019 2344)  . vancomycin 1,000 mg (10/15/19 1131)   PRN Meds: acetaminophen **OR** acetaminophen, ondansetron **OR** ondansetron (ZOFRAN) IV  Allergies:   No Known Allergies  Social  History:   Social History   Socioeconomic History  . Marital status: Married    Spouse name: Not on file  . Number of children: Not on file  . Years of education: Not on file  . Highest education level: Not on file  Occupational History  . Occupation: retired  Scientific laboratory technician  . Financial resource strain: Not on file  . Food insecurity    Worry: Not on file    Inability: Not on file  . Transportation needs    Medical: Not on file    Non-medical: Not on file  Tobacco Use  . Smoking status: Never Smoker  . Smokeless tobacco: Never Used  Substance and Sexual Activity  . Alcohol use: Yes    Alcohol/week: 1.0 standard drinks    Types: 1 Glasses of wine per week    Comment: social   . Drug use: No  . Sexual activity: Not on file  Lifestyle  . Physical activity    Days per week: Not on file    Minutes per session: Not on file  . Stress: Not on file  Relationships  . Social Herbalist on phone: Not on file    Gets together: Not on file    Attends religious service: Not on file    Active member of club or organization: Not on file    Attends meetings of clubs or organizations: Not on file    Relationship status: Not on file  . Intimate partner violence    Fear of current or ex partner: Not on file    Emotionally abused: Not on file    Physically abused: Not on file    Forced sexual activity: Not on file  Other Topics Concern  . Not on file  Social History Narrative  . Not on file    Family History:    Family History  Problem Relation Age of Onset  . Heart attack Father 12  . Hypertension Father   . Hyperlipidemia Father   . Heart disease Brother        valvular disease      ROS:  Please see the history of present illness.  Review of Systems  Constitutional: Positive for fever and malaise/fatigue. Negative for diaphoresis.       She was not checking her temperature at home.  Fever noted on arrival to emergency department.  Respiratory: Positive for  shortness of breath. Negative for hemoptysis.   Cardiovascular: Negative for  chest pain, palpitations, orthopnea and leg swelling.  Gastrointestinal: Positive for nausea. Negative for blood in stool, constipation, diarrhea, melena and vomiting.  Genitourinary: Negative for hematuria.  Musculoskeletal: Negative for falls.  Neurological: Negative for loss of consciousness.       Confusion noted, unable to remember the events that occurred from episode of shortness of breath to arrival in emergency department.  All other systems reviewed and are negative.   All other ROS reviewed and negative.     Physical Exam/Data:   Vitals:   10/15/19 0225 10/15/19 0246 10/15/19 0759 10/15/19 0800  BP:  119/61 (!) 160/97   Pulse:  67 (!) 49 69  Resp:  19 16 16   Temp: 98.7 F (37.1 C) 98.5 F (36.9 C) 99.2 F (37.3 C)   TempSrc: Oral Oral Oral   SpO2:  98% 95% 97%  Weight:  69.3 kg    Height:  5' 2.5" (1.588 m)      Intake/Output Summary (Last 24 hours) at 10/15/2019 1143 Last data filed at 10/15/2019 1124 Gross per 24 hour  Intake 1122.34 ml  Output 400 ml  Net 722.34 ml   Last 3 Weights 10/15/2019 10/25/2019 10/11/2019  Weight (lbs) 152 lb 11.2 oz 140 lb 147 lb 14.9 oz  Weight (kg) 69.264 kg 63.504 kg 67.1 kg     Body mass index is 27.48 kg/m.  General:  Well nourished, well developed, in no acute distress.  Joined by her husband HEENT: normal Neck: no JVD Vascular: No carotid bruits; radial pulses 2+ bilaterally Cardiac:  normal S1, S2; RRR; 2/6 systolic murmur Lungs:  clear to auscultation bilaterally, no wheezing, rhonchi or rales  Abd: soft, nontender, no hepatomegaly  Ext: no b/l lower extremity edema Musculoskeletal:  No deformities, BUE and BLE strength normal and equal Skin: warm and dry  Neuro:  No focal abnormalities noted Psych:  Normal affect   EKG:  The EKG was personally reviewed and demonstrates:  No acute ST/T changes EKG showed sinus tachycardia, 106 bpm, LVH,  prolonged QTC, baseline wander, no acute changes Telemetry:  Telemetry was personally reviewed and demonstrates: Sinus rhythm with occasional PVCs and short atrial runs  Relevant CV Studies: Pending updated echo  LHC 8/20 1. Severe 3 vessel disease.  2. Elevated right and left heart filling pressures.  3. Low cardiac output (CI 2.0).  4. 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  5. Prominent v-wave in the PCWP tracing is consistent with severe mitral regurgitation noted on TEE.  6. The discrepancy between LVEDP and PCWP suggests a degree of mitral stenosis (MS looked mild on TEE).   No chest pain and dyspnea is stable.  I will let her go home today but will increase Lasix to 40 mg bid.  I am going to refer Mrs Flanery for TCTS evaluation.  She will need CABG + MV replacement ideally.  She may need to be admitted prior to surgery for hemodynamic optimization.  She will need close followup in CHF clinic.  She will need BMET next week.    TEE 07/2019  1. The left ventricle has normal systolic function, with an ejection fraction of 60-65%. The cavity size was normal. There is mildly increased left ventricular wall thickness. No evidence of left ventricular regional wall motion abnormalities.  2. The right ventricle has mildly reduced systolic function. The cavity was mildly enlarged.  3. Left atrial size was moderately dilated.  4. The mitral valve was thickened/calcified with significant restriction of the posterior leaflet. ?  Rheumatic. There was severe mitral regurgitation with ERO 0.36 cm^2 but calculated regurgitant volume 97 cc. There was flow reversal in the pulmonary  vein systolic doppler pattern. Mean gradient across the valve 5 mmHg, MVA 3.38 cm^2 by PHT but 2.35 cm^2 by planimetry. Suspect no more than mild mitral stenosis.  5. There was moderate TR, peak RV-RA gradient 53 mmHg.  6. The aortic valve is tricuspid Moderate calcification of the aortic valve. Aortic  valve regurgitation is trivial by color flow Doppler. No stenosis of the aortic valve.  7. Normal caliber thoracic aorta with grade 4 plaque in the descending thoracic aorta.  8. No evidence for PFO/ASD by color doppler.   Laboratory Data:  High Sensitivity Troponin:   Recent Labs  Lab 10/06/2019 1732 10/24/2019 2115 10/15/19 0944  TROPONINIHS 99* 256* 473*     Cardiac EnzymesNo results for input(s): TROPONINI in the last 168 hours. No results for input(s): TROPIPOC in the last 168 hours.  Chemistry Recent Labs  Lab 10/11/19 1531 10/18/2019 1732 10/15/19 0430  NA 138 135 138  K 3.4* 4.0 2.8*  CL 95* 92* 98  CO2 24 28 28   GLUCOSE 91 103* 116*  BUN 29* 29* 25*  CREATININE 1.92* 1.66* 1.45*  CALCIUM 9.9 9.1 8.7*  GFRNONAA 24* 29* 34*  GFRAA 28* 34* 40*  ANIONGAP 19* 15 12    Recent Labs  Lab 10/11/19 1531 10/02/2019 1732  PROT 7.6 7.5  ALBUMIN 3.8 3.4*  AST 28 27  ALT 20 15  ALKPHOS 111 98  BILITOT 0.6 1.0   Hematology Recent Labs  Lab 10/11/19 1531 09/30/2019 1732 10/15/19 0430  WBC 15.7* 18.8* 16.5*  RBC 4.84 4.41 4.05  HGB 15.7* 14.0 13.2  HCT 49.2* 42.1 39.8  MCV 101.7* 95.5 98.3  MCH 32.4 31.7 32.6  MCHC 31.9 33.3 33.2  RDW 14.1 13.7 13.6  PLT 328 302 251   BNP Recent Labs  Lab 10/15/2019 2249  BNP 425.0*    DDimer No results for input(s): DDIMER in the last 168 hours.   Radiology/Studies:  Dg Chest 2 View  Result Date: 10/11/2019 CLINICAL DATA:  Mitral valve insufficiency. Atrial fibrillation. Coronary artery disease. Pre-op respiratory exam EXAM: CHEST - 2 VIEW COMPARISON:  08/06/2019 FINDINGS: Stable mild cardiomegaly. Aortic atherosclerosis. Both lungs are clear. The visualized skeletal structures are unremarkable. IMPRESSION: No active cardiopulmonary disease. Electronically Signed   By: Marlaine Hind M.D.   On: 10/11/2019 15:56   Ct Head Wo Contrast  Result Date: 10/18/2019 CLINICAL DATA:  Altered level of consciousness. EXAM: CT HEAD WITHOUT  CONTRAST TECHNIQUE: Contiguous axial images were obtained from the base of the skull through the vertex without intravenous contrast. COMPARISON:  August 06, 2019. FINDINGS: Brain: Mild chronic ischemic white matter disease is noted. No mass effect or midline shift is noted. Ventricular size is within normal limits. There is no evidence of mass lesion, hemorrhage or acute infarction. Vascular: No hyperdense vessel or unexpected calcification. Skull: Normal. Negative for fracture or focal lesion. Sinuses/Orbits: No acute finding. Other: None. IMPRESSION: Mild chronic ischemic white matter disease. No acute intracranial abnormality seen. Electronically Signed   By: Marijo Conception M.D.   On: 10/22/2019 18:02   Dg Chest Portable 1 View  Result Date: 10/15/2019 CLINICAL DATA:  Shortness of breath EXAM: PORTABLE CHEST 1 VIEW COMPARISON:  10/11/2019 FINDINGS: Cardiomegaly. Both lungs are clear. The visualized skeletal structures are unremarkable. IMPRESSION: Cardiomegaly without acute abnormality of the lungs in AP portable projection. Electronically  Signed   By: Eddie Candle M.D.   On: 10/18/2019 17:48   Vas US Doppler Pre Cabg  Result Date: 10/12/2019 PREOPERATIVE VASCULAR EVALUATION  Indications:      Pre MVR. Comparison Study: no prior Performing Technologist: June Leap Rvt, Rdms  Examination Guidelines: A complete evaluation includes B-mode imaging, spectral Doppler, color Doppler, and power Doppler as needed of all accessible portions of each vessel. Bilateral testing is considered an integral part of a complete examination. Limited examinations for reoccurring indications may be performed as noted.  Right Carotid Findings: +----------+--------+--------+--------+------------+--------+           PSV cm/sEDV cm/sStenosisDescribe    Comments +----------+--------+--------+--------+------------+--------+ CCA Prox  67      12                                    +----------+--------+--------+--------+------------+--------+ CCA Distal66      11                                   +----------+--------+--------+--------+------------+--------+ ICA Prox  94      19      1-39%   heterogenous         +----------+--------+--------+--------+------------+--------+ ICA Distal76      19                                   +----------+--------+--------+--------+------------+--------+ ECA       619     30      >50%                         +----------+--------+--------+--------+------------+--------+ Portions of this table do not appear on this page. +----------+--------+-------+----------------+------------+           PSV cm/sEDV cmsDescribe        Arm Pressure +----------+--------+-------+----------------+------------+ [id number]     3      Multiphasic, [id number]          +----------+--------+-------+----------------+------------+ +---------+--------+--+--------+--+---------+ VertebralPSV cm/s82EDV cm/s13Antegrade +---------+--------+--+--------+--+---------+ Left Carotid Findings: +----------+--------+--------+--------+--------------------------+--------+           PSV cm/sEDV cm/sStenosisDescribe                  Comments +----------+--------+--------+--------+--------------------------+--------+ CCA Prox  80      12                                                 +----------+--------+--------+--------+--------------------------+--------+ CCA Distal83      14                                                 +----------+--------+--------+--------+--------------------------+--------+ ICA Prox  197     43      40-59%  heterogenous and irregular         +----------+--------+--------+--------+--------------------------+--------+ ICA Mid   224     35                                                 +----------+--------+--------+--------+--------------------------+--------+  ICA Distal133     29                                                  +----------+--------+--------+--------+--------------------------+--------+ ECA       133     8                                                  +----------+--------+--------+--------+--------------------------+--------+ +----------+--------+--------+--------------------+------------+ SubclavianPSV cm/sEDV cm/sDescribe            Arm Pressure +----------+--------+--------+--------------------+------------+           82              Monophasic/ dampened142          +----------+--------+--------+--------------------+------------+ +---------+--------+--+--------+----------+ VertebralPSV cm/s72EDV cm/sRetrograde +---------+--------+--+--------+----------+  Right Doppler Findings: +--------+--------+-----+---------+--------+ Site    PressureIndexDoppler  Comments +--------+--------+-----+---------+--------+ [id number]          triphasic         +--------+--------+-----+---------+--------+ Radial               triphasic         +--------+--------+-----+---------+--------+ Ulnar                triphasic         +--------+--------+-----+---------+--------+  Left Doppler Findings: +--------+--------+-----+----------+--------+ Site    PressureIndexDoppler   Comments +--------+--------+-----+----------+--------+ [id number]          monophasic         +--------+--------+-----+----------+--------+ Radial               monophasic         +--------+--------+-----+----------+--------+ Ulnar                monophasic         +--------+--------+-----+----------+--------+  Summary: Right Carotid: Velocities in the right ICA are consistent with a 1-39% stenosis.                The ECA appears >50% stenosed. Left Carotid: Velocities in the left ICA are consistent with a 40-59% stenosis. Vertebrals:  Right vertebral artery demonstrates antegrade flow. Left vertebral              artery demonstrates retrograde flow. Subclavians:  Normal flow hemodynamics were seen in the right subclavian artery.              Left subclavian monophasic, indicating a more proximal stenosis. Right Upper Extremity: Doppler waveforms remain within normal limits with right radial compression. Doppler waveforms remain within normal limits with right ulnar compression. Left Upper Extremity: Doppler waveforms remain within normal limits with left radial compression. Doppler waveform obliterate with left ulnar compression.  Electronically signed by Harold Barban MD on 10/12/2019 at 12:43:48 AM.    Final     Assessment and Plan:   Elevated troponin without CP Known history of CAD by 07/2019 cardiac cath --No reported chest pain.  Recent cath 8/20 showed severe three-vessel disease and findings as above. --High sensitivity troponin minimally elevated. EKG without acute changes. Likely 2/2 supply demand ischemia with suspected illness due to fever, tachycardia, and hypotension at presentation with leukocytosis on labs.  --Echo was ordered and pending official results.  No plan for emergent invasive ischemic work-up at this time  and unless patient's echo shows significant decrease in EF or new wall motion abnormality, or if significant jump in HS Tn. --Continue medical management. Restart of medications as BP allows.  Chronic diastolic CHF --Reported acute onset SOB, compliant with PTA Lasix. Of note, cath showed elevated R heart pressures. --BNP not significantly elevated and below 500. --Echo ordered and pending official results.  Previous echos with nl EF.  --Not volume overloaded on exam.  --Consider severe mitral regurgitation as contributing to shortness of breath.  Patient was also significantly hypotensive on presentation with a fever and elevated white blood cell count. Consider dehydration /hypotension due to illness. --Continue medical management. With stable BP, restart remainder of home medications.   Hypokalemia --K 2.8. --Replete with  goal 4.0. --Check Mg  Leukocytosis --Started on abx. --Per IM.  Mitral valve disease --Pending MVR, originally scheduled for today.  Likely contributing to shortness of breath.  HTN --Most recent BP elevated with PTA antihypertensives not resumed at admission given her hypotension. Recommend restart as tolerated.   Paroxysmal atrial fibrillation --PTA Eliquis was on hold with upcoming MVR.  She is currently SR with short atrial runs. She is on IV heparin currently.  --Depending on timing of rescheduled procedure, will need to again hold anticoagulation to allow for washout before surgery. Procedure will likely remain on hold until recovered from illness.  CKD 3 --Daily BMET.  PAD --No recent complaint of claudication.  For questions or updates, please contact [place] Please consult [email or web address] for contact info under     Signed, Arvil Chaco, PA-C  10/15/2019 11:43 AM

## 2019-10-15 NOTE — Progress Notes (Signed)
ANTICOAGULATION CONSULT NOTE - Initial Consult  Pharmacy Consult for Heparin  Indication: chest pain/ACS  No Known Allergies  Patient Measurements: Height: 5' 2.5" (158.8 cm) Weight: 152 lb 11.2 oz (69.3 kg) IBW/kg (Calculated) : 51.25 Heparin Dosing Weight: 63.5 kg   Vital Signs: Temp: 98.4 F (36.9 C) (10/20 1523) Temp Source: Oral (10/20 1523) BP: 117/96 (10/20 1558) Pulse Rate: 130 (10/20 1559)  Labs: Recent Labs    09/29/2019 1732 10/08/2019 2115 10/19/2019 2249 10/15/19 0430 10/15/19 0742 10/15/19 0944 10/15/19 1532  HGB 14.0  --   --  13.2  --   --   --   HCT 42.1  --   --  39.8  --   --   --   PLT 302  --   --  251  --   --   --   APTT  --   --  38* 64* 55*  --   --   LABPROT  --   --  16.2*  --   --   --   --   INR  --   --  1.3*  --   --   --   --   HEPARINUNFRC  --   --  0.25*  --  0.44  --  0.33  CREATININE 1.66*  --   --  1.45*  --   --   --   TROPONINIHS 99* 256*  --   --   --  473* 399*    Estimated Creatinine Clearance: 29.1 mL/min (A) (by C-G formula based on SCr of 1.45 mg/dL (H)).   Medical History: Past Medical History:  Diagnosis Date  . Arthritis   . CAD   . Chronic diastolic CHF (congestive heart failure) ([place])   . CKD (chronic kidney disease), stage IV ([place])   . coronary artery disease   . History of kidney stones 01/2017  . Hypertension   . Hypothyroidism   . Mitral regurgitation and mitral stenosis   . PAF (paroxysmal atrial fibrillation) ([place])    a. On Eliquis; b. CHADS2VASc => 8 (CHF, HTN, age x 2, stroke x 2, vascular disease, female)  . Stroke [facility])    TIA 9/17 & 04/06/2017  . Tricuspid regurgitation     Medications:  Medications Prior to Admission  Medication Sig Dispense Refill Last Dose  . acetaminophen (TYLENOL) 500 MG tablet Take 1,000 mg by mouth daily as needed for moderate pain.    Unknown at PRN  . allopurinol (ZYLOPRIM) 100 MG tablet Take 100 mg by mouth daily.    10/23/2019 at 0800  . amLODipine (NORVASC) 5 MG  tablet Take 1 tablet (5 mg total) by mouth daily. 30 tablet 6 10/19/2019 at 0800  . docusate sodium (COLACE) 100 MG capsule Take 1 capsule (100 mg total) by mouth 2 (two) times daily. (Patient taking differently: Take 100 mg by mouth daily as needed for moderate constipation. ) 10 capsule 0 Unknown at PRN  . ELIQUIS 5 MG TABS tablet Take 1 tablet by mouth twice daily 60 tablet 5 10/09/2019 at Unknown  . furosemide (LASIX) 40 MG tablet Take 1.5 tablets (60 mg total) by mouth 2 (two) times daily. 270 tablet 3 09/29/2019 at 0800  . levothyroxine (SYNTHROID, LEVOTHROID) 112 MCG tablet Take 112 mcg by mouth daily before breakfast.   10/15/2019 at 0800  . metoprolol tartrate (LOPRESSOR) 50 MG tablet Take 1 tablet (50 mg total) by mouth 2 (two) times daily. 180 tablet 3 10/19/2019 at  0800  . rosuvastatin (CRESTOR) 20 MG tablet Take 1 tablet (20 mg total) by mouth daily. 90 tablet 3 10/13/2019 at Unknown time  . spironolactone (ALDACTONE) 25 MG tablet Take 0.5 tablets (12.5 mg total) by mouth daily. 45 tablet 3 10/21/2019 at 0800  . sildenafil (REVATIO) 20 MG tablet Take 1 tablet (20 mg total) by mouth 3 (three) times daily. (Patient not taking: Reported on 10/08/2019) 90 tablet 3 Not Taking at Unknown time    Assessment: Pharmacy consulted to dose heparin in this 78 year old female admitted with ACS/NSTEMI.  Pt was on Eliquis PTA, last dose was on 10/14 .  Eliquis was stopped due to surgical procedure scheduled for 10/20.  CrCl = 24.4 ml/min   Goal of Therapy:  APTT 66 - 102 seconds Heparin level 0.3-0.7 units/ml Monitor platelets by anticoagulation protocol: Yes   10/20 0430 APTT 64 (subtherapeutic) 10/20 0742 APTT 55 (subtherapeutic) 10/20 0742 HL 0.44 10/20 1532 HL 0.33 (therapeutic), as a result of rate not being changed.   Plan:  1. Will continue current heparin rate of 750 units/hr and recheck heparin level in 8 hours. Will follow CBC with AM labs.  Rowland Lathe, PharmD Pharmacy  Resident  10/15/2019 4:22 PM

## 2019-10-15 NOTE — Progress Notes (Signed)
Pharmacy Antibiotic Note  Caroline [name] is a 78 y.o. female admitted on 10/03/2019 with pneumonia.  Pharmacy has been consulted for vanc/cefepime dosing.  Plan: 10/20 @ 0500 vanc random 18 mcg/mL. Will give a dose of vanc 1g IV x 1 and will recheck a random level w/ am labs. Scr has come down will continue to monitor.  Will continue cefepime 2g IV daily per CrCl 11 - 29 ml/min. Will continue to monitor renal function and adjust doses as necessary.  Height: 5' 2.5" (158.8 cm) Weight: 152 lb 11.2 oz (69.3 kg) IBW/kg (Calculated) : 51.25  Temp (24hrs), Avg:99.3 F (37.4 C), Min:98.5 F (36.9 C), Max:100.9 F (38.3 C)  Recent Labs  Lab 10/11/19 1531 10/12/2019 1732 10/05/2019 1845 10/25/2019 2115 10/15/19 0430  WBC 15.7* 18.8*  --   --  16.5*  CREATININE 1.92* 1.66*  --   --  1.45*  LATICACIDVEN  --   --  1.9 1.2  --   VANCORANDOM  --   --   --   --  18    Estimated Creatinine Clearance: 29.1 mL/min (A) (by C-G formula based on SCr of 1.45 mg/dL (H)).    No Known Allergies  Thank you for allowing pharmacy to be a part of this patient's care.  Tobie Lords, PharmD, BCPS Clinical Pharmacist 10/15/2019 5:54 AM

## 2019-10-15 NOTE — Plan of Care (Signed)
  Problem: Education: Goal: Knowledge of General Education information will improve Description: Including pain rating scale, medication(s)/side effects and non-pharmacologic comfort measures Outcome: Progressing   Problem: Clinical Measurements: Goal: Ability to maintain clinical measurements within normal limits will improve Outcome: Progressing Goal: Diagnostic test results will improve Outcome: Progressing   

## 2019-10-15 NOTE — Plan of Care (Signed)
  Problem: Education: Goal: Knowledge of General Education information will improve Description: Including pain rating scale, medication(s)/side effects and non-pharmacologic comfort measures Outcome: Progressing Note: Patient admitted without any chest pain. Patient profile completed. Heparin drip infusing. Will continue to monitor

## 2019-10-15 NOTE — Progress Notes (Signed)
ANTICOAGULATION CONSULT NOTE - Initial Consult  Pharmacy Consult for Heparin  Indication: chest pain/ACS  No Known Allergies  Patient Measurements: Height: 5' 2.5" (158.8 cm) Weight: 152 lb 11.2 oz (69.3 kg) IBW/kg (Calculated) : 51.25 Heparin Dosing Weight: 63.5 kg   Vital Signs: Temp: 98.5 F (36.9 C) (10/20 0246) Temp Source: Oral (10/20 0246) BP: 119/61 (10/20 0246) Pulse Rate: 67 (10/20 0246)  Labs: Recent Labs    10/02/2019 1732 10/15/2019 2115 10/23/2019 2249 10/15/19 0430  HGB 14.0  --   --  13.2  HCT 42.1  --   --  39.8  PLT 302  --   --  251  APTT  --   --  38* 64*  LABPROT  --   --  16.2*  --   INR  --   --  1.3*  --   HEPARINUNFRC  --   --  0.25*  --   CREATININE 1.66*  --   --  1.45*  TROPONINIHS 99* 256*  --   --     Estimated Creatinine Clearance: 29.1 mL/min (A) (by C-G formula based on SCr of 1.45 mg/dL (H)).   Medical History: Past Medical History:  Diagnosis Date  . Arthritis   . CAD   . Chronic diastolic CHF (congestive heart failure) ([place])   . CKD (chronic kidney disease), stage IV ([place])   . coronary artery disease   . History of kidney stones 01/2017  . Hypertension   . Hypothyroidism   . Mitral regurgitation and mitral stenosis   . PAF (paroxysmal atrial fibrillation) ([place])    a. On Eliquis; b. CHADS2VASc => 8 (CHF, HTN, age x 2, stroke x 2, vascular disease, female)  . Stroke [facility])    TIA 9/17 & 04/06/2017  . Tricuspid regurgitation     Medications:  Medications Prior to Admission  Medication Sig Dispense Refill Last Dose  . acetaminophen (TYLENOL) 500 MG tablet Take 1,000 mg by mouth daily as needed for moderate pain.    Unknown at PRN  . allopurinol (ZYLOPRIM) 100 MG tablet Take 100 mg by mouth daily.    10/12/2019 at 0800  . amLODipine (NORVASC) 5 MG tablet Take 1 tablet (5 mg total) by mouth daily. 30 tablet 6 09/30/2019 at 0800  . docusate sodium (COLACE) 100 MG capsule Take 1 capsule (100 mg total) by mouth 2 (two) times daily.  (Patient taking differently: Take 100 mg by mouth daily as needed for moderate constipation. ) 10 capsule 0 Unknown at PRN  . ELIQUIS 5 MG TABS tablet Take 1 tablet by mouth twice daily 60 tablet 5 10/09/2019 at Unknown  . furosemide (LASIX) 40 MG tablet Take 1.5 tablets (60 mg total) by mouth 2 (two) times daily. 270 tablet 3 10/15/2019 at 0800  . levothyroxine (SYNTHROID, LEVOTHROID) 112 MCG tablet Take 112 mcg by mouth daily before breakfast.   10/13/2019 at 0800  . metoprolol tartrate (LOPRESSOR) 50 MG tablet Take 1 tablet (50 mg total) by mouth 2 (two) times daily. 180 tablet 3 10/25/2019 at 0800  . rosuvastatin (CRESTOR) 20 MG tablet Take 1 tablet (20 mg total) by mouth daily. 90 tablet 3 10/13/2019 at Unknown time  . spironolactone (ALDACTONE) 25 MG tablet Take 0.5 tablets (12.5 mg total) by mouth daily. 45 tablet 3 09/26/2019 at 0800  . sildenafil (REVATIO) 20 MG tablet Take 1 tablet (20 mg total) by mouth 3 (three) times daily. (Patient not taking: Reported on 10/08/2019) 90 tablet 3 Not Taking at  Unknown time    Assessment: Pharmacy consulted to dose heparin in this 78 year old female admitted with ACS/NSTEMI.  Pt was on Eliquis PTA, last dose was on 10/14 .  Eliquis was stopped due to surgical procedure scheduled for 10/20.  CrCl = 24.4 ml/min   Goal of Therapy:  APTT 66 - 102 seconds Heparin level 0.3-0.7 units/ml Monitor platelets by anticoagulation protocol: Yes   Plan:  10/20 @ 0500 aPTT 64 seconds therapeutic. Will continue current rate of heparin drip and will dose off of aPTT since baseline HL is > 0.10. f/u HL at 0730 for correlation, CBC trended down slightly will continue to monitor.  Tobie Lords, PharmD, BCPS Clinical Pharmacist 10/15/2019,5:55 AM

## 2019-10-15 NOTE — ED Notes (Signed)
This RN assisted pt to bedside toilet. Pt urinated.

## 2019-10-15 NOTE — ED Notes (Addendum)
ED TO INPATIENT HANDOFF REPORT  ED Nurse Name and Phone #:  Gershon Mussel RN  [phone]  S Name/Age/Gender Caroline [name] 78 y.o. female Room/Bed: ED06A/ED06A  Code Status   Code Status: Prior  Home/SNF/Other Home Patient oriented to: self, place, time and situation Is this baseline? Yes   Triage Complete: Triage complete  Chief Complaint SOB  Triage Note Pt arrived from home via [facility] following a period of SOB that was unrelieved by home O2. Pt was 88% on 4L O2 when EMS arrived.     Allergies No Known Allergies  Level of Care/Admitting Diagnosis ED Disposition    ED Disposition Condition [facility] Area: [place] [[id number]]  Level of Care: Telemetry [5]  Covid Evaluation: Confirmed COVID Negative  Diagnosis: SIRS (systemic inflammatory response syndrome) [facility]) [id number]  Admitting Physician: Lance Coon [id number]  Attending Physician: Jannifer Franklin, DAVID [phone]  Estimated length of stay: past midnight tomorrow  Certification:: I certify this patient will need inpatient services for at least 2 midnights  Bed request comments: 2a  PT Class (Do Not Modify): Inpatient [101]  PT Acc Code (Do Not Modify): Private [1]       B Medical/Surgery History Past Medical History:  Diagnosis Date  . Arthritis   . CAD   . Chronic diastolic CHF (congestive heart failure) ([place])   . CKD (chronic kidney disease), stage IV ([place])   . coronary artery disease   . History of kidney stones 01/2017  . Hypertension   . Hypothyroidism   . Mitral regurgitation and mitral stenosis   . PAF (paroxysmal atrial fibrillation) ([place])    a. On Eliquis; b. CHADS2VASc => 8 (CHF, HTN, age x 2, stroke x 2, vascular disease, female)  . Stroke [facility])    TIA 9/17 & 04/06/2017  . Tricuspid regurgitation    Past Surgical History:  Procedure Laterality Date  . ABDOMINAL HYSTERECTOMY    . CARDIAC CATHETERIZATION     Negative   . COLONOSCOPY  2010  . PARTIAL  HYSTERECTOMY    . RIGHT/LEFT HEART CATH AND CORONARY ANGIOGRAPHY N/A 08/02/2019   Procedure: RIGHT/LEFT HEART CATH AND CORONARY ANGIOGRAPHY;  Surgeon: Larey Dresser, MD;  Location: [facility] CV LAB;  Service: Cardiovascular;  Laterality: N/A;  . TEE WITHOUT CARDIOVERSION N/A 08/02/2019   Procedure: TRANSESOPHAGEAL ECHOCARDIOGRAM (TEE);  Surgeon: Larey Dresser, MD;  Location: [facility] ENDOSCOPY;  Service: Cardiovascular;  Laterality: N/A;  . TONSILLECTOMY    . TOTAL HIP ARTHROPLASTY Left 08/08/2019   Procedure: TOTAL HIP ARTHROPLASTY ANTERIOR APPROACH;  Surgeon: Rod Can, MD;  Location: [place];  Service: Orthopedics;  Laterality: Left;  . TOTAL KNEE ARTHROPLASTY Left 11/28/2017   Procedure: TOTAL KNEE ARTHROPLASTY;  Surgeon: [place], MD;  Location: [facility] ORS;  Service: Orthopedics;  Laterality: Left;     A IV Location/Drains/Wounds Patient Lines/Drains/Airways Status   Active Line/Drains/Airways    Name:   Placement date:   Placement time:   Site:   Days:   Peripheral IV 10/04/2019 Right Forearm   10/08/2019    1745    Forearm   1   Peripheral IV 10/05/2019 Right Antecubital   09/29/2019    1834    Antecubital   1   Incision (Closed) 08/08/19 Hip Left   08/08/19    2104     68          Intake/Output Last 24 hours  Intake/Output Summary (Last 24 hours) at 10/15/2019 0143 Last  data filed at 10/17/2019 2147 Gross per 24 hour  Intake 1067.84 ml  Output -  Net 1067.84 ml    Labs/Imaging Results for orders placed or performed during the hospital encounter of 10/15/2019 (from the past 48 hour(s))  CBC with Differential     Status: Abnormal   Collection Time: 10/24/2019  5:32 PM  Result Value Ref Range   WBC 18.8 (H) 4.0 - 10.5 K/uL   RBC 4.41 3.87 - 5.11 MIL/uL   Hemoglobin 14.0 12.0 - 15.0 g/dL   HCT 42.1 36.0 - 46.0 %   MCV 95.5 80.0 - 100.0 fL   MCH 31.7 26.0 - 34.0 pg   MCHC 33.3 30.0 - 36.0 g/dL   RDW 13.7 11.5 - 15.5 %   Platelets 302 150 - 400 K/uL   nRBC 0.0 0.0 - 0.2 %    Neutrophils Relative % 65 %   Neutro Abs 12.3 (H) 1.7 - 7.7 K/uL   Lymphocytes Relative 19 %   Lymphs Abs 3.6 0.7 - 4.0 K/uL   Monocytes Relative 14 %   Monocytes Absolute 2.7 (H) 0.1 - 1.0 K/uL   Eosinophils Relative 1 %   Eosinophils Absolute 0.1 0.0 - 0.5 K/uL   Basophils Relative 0 %   Basophils Absolute 0.1 0.0 - 0.1 K/uL   Immature Granulocytes 1 %   Abs Immature Granulocytes 0.11 (H) 0.00 - 0.07 K/uL    Comment: Performed at [facility], [address] [place], [place] [address]  Comprehensive metabolic panel     Status: Abnormal   Collection Time: 09/26/2019  5:32 PM  Result Value Ref Range   Sodium 135 135 - 145 mmol/L   Potassium 4.0 3.5 - 5.1 mmol/L    Comment: HEMOLYSIS AT THIS LEVEL MAY AFFECT RESULT   Chloride 92 (L) 98 - 111 mmol/L   CO2 28 22 - 32 mmol/L   Glucose, Bld 103 (H) 70 - 99 mg/dL   BUN 29 (H) 8 - 23 mg/dL   Creatinine, Ser 1.66 (H) 0.44 - 1.00 mg/dL   Calcium 9.1 8.9 - 10.3 mg/dL   Total Protein 7.5 6.5 - 8.1 g/dL   Albumin 3.4 (L) 3.5 - 5.0 g/dL   AST 27 15 - 41 U/L   ALT 15 0 - 44 U/L   Alkaline Phosphatase 98 38 - 126 U/L   Total Bilirubin 1.0 0.3 - 1.2 mg/dL   GFR calc non Af Amer 29 (L) >60 mL/min   GFR calc Af Amer 34 (L) >60 mL/min   Anion gap 15 5 - 15    Comment: Performed at [facility], [place], [place] [address]  Troponin I (High Sensitivity)     Status: Abnormal   Collection Time: 10/12/2019  5:32 PM  Result Value Ref Range   Troponin I (High Sensitivity) 99 (H) <18 ng/L    Comment: (NOTE) Elevated high sensitivity troponin I (hsTnI) values and significant  changes across serial measurements may suggest ACS but many other  chronic and acute conditions are known to elevate hsTnI results.  Refer to the "Links" section for chest pain algorithms and additional  guidance. Performed at [facility], [address] [place], [place] [address]   Urinalysis, Complete w Microscopic      Status: Abnormal   Collection Time: 10/18/2019  6:25 PM  Result Value Ref Range   Color, Urine YELLOW (A) YELLOW   APPearance CLEAR (A) CLEAR   Specific Gravity, Urine 1.013 1.005 -  1.030   pH 6.0 5.0 - 8.0   Glucose, UA NEGATIVE NEGATIVE mg/dL   Hgb urine dipstick SMALL (A) NEGATIVE   Bilirubin Urine NEGATIVE NEGATIVE   Ketones, ur NEGATIVE NEGATIVE mg/dL   Protein, ur 100 (A) NEGATIVE mg/dL   Nitrite NEGATIVE NEGATIVE   Leukocytes,Ua NEGATIVE NEGATIVE   RBC / HPF 0-5 0 - 5 RBC/hpf   WBC, UA 0-5 0 - 5 WBC/hpf   Bacteria, UA NONE SEEN NONE SEEN   Squamous Epithelial / LPF 0-5 0 - 5   Mucus PRESENT    Hyaline Casts, UA PRESENT     Comment: Performed at [facility], [address] [place], [place] [address]  Lactic acid, plasma     Status: None   Collection Time: 09/26/2019  6:45 PM  Result Value Ref Range   Lactic Acid, Venous 1.9 0.5 - 1.9 mmol/L    Comment: Performed at [facility], [place], [place] [address]  SARS Coronavirus 2 by RT PCR (hospital order, performed in [place] hospital lab) Nasopharyngeal Nasopharyngeal Swab     Status: None   Collection Time: 09/30/2019  7:13 PM   Specimen: Nasopharyngeal Swab  Result Value Ref Range   SARS Coronavirus 2 NEGATIVE NEGATIVE    Comment: (NOTE) If result is NEGATIVE SARS-CoV-2 target nucleic acids are NOT DETECTED. The SARS-CoV-2 RNA is generally detectable in upper and lower  respiratory specimens during the acute phase of infection. The lowest  concentration of SARS-CoV-2 viral copies this assay can detect is 250  copies / mL. A negative result does not preclude SARS-CoV-2 infection  and should not be used as the sole basis for treatment or other  patient management decisions.  A negative result may occur with  improper specimen collection / handling, submission of specimen other  than nasopharyngeal swab, presence of viral mutation(s) within the  areas targeted by this assay, and  inadequate number of viral copies  (<250 copies / mL). A negative result must be combined with clinical  observations, patient history, and epidemiological information. If result is POSITIVE SARS-CoV-2 target nucleic acids are DETECTED. The SARS-CoV-2 RNA is generally detectable in upper and lower  respiratory specimens dur ing the acute phase of infection.  Positive  results are indicative of active infection with SARS-CoV-2.  Clinical  correlation with patient history and other diagnostic information is  necessary to determine patient infection status.  Positive results do  not rule out bacterial infection or co-infection with other viruses. If result is PRESUMPTIVE POSTIVE SARS-CoV-2 nucleic acids MAY BE PRESENT.   A presumptive positive result was obtained on the submitted specimen  and confirmed on repeat testing.  While 2019 novel coronavirus  (SARS-CoV-2) nucleic acids may be present in the submitted sample  additional confirmatory testing may be necessary for epidemiological  and / or clinical management purposes  to differentiate between  SARS-CoV-2 and other Sarbecovirus currently known to infect humans.  If clinically indicated additional testing with an alternate test  methodology [phone]) is advised. The SARS-CoV-2 RNA is generally  detectable in upper and lower respiratory sp ecimens during the acute  phase of infection. The expected result is Negative. Fact Sheet for Patients:  StrictlyIdeas.no Fact Sheet for Healthcare Providers: BankingDealers.co.za This test is not yet approved or cleared by the [place] FDA and has been authorized for detection and/or diagnosis of SARS-CoV-2 by FDA under an Emergency Use Authorization (EUA).  This EUA will remain in effect (meaning this test  can be used) for the duration of the COVID-19 declaration under Section 564(b)(1) of the Act, 21 U.S.C. section 360bbb-3(b)(1), unless the  authorization is terminated or revoked sooner. Performed at [facility], [address] [place], [place] [address]   Lactic acid, plasma     Status: None   Collection Time: 10/07/2019  9:15 PM  Result Value Ref Range   Lactic Acid, Venous 1.2 0.5 - 1.9 mmol/L    Comment: Performed at [facility], [place], [place] [address]  Troponin I (High Sensitivity)     Status: Abnormal   Collection Time: 10/18/2019  9:15 PM  Result Value Ref Range   Troponin I (High Sensitivity) 256 (HH) <18 ng/L    Comment: CRITICAL RESULT CALLED TO, READ BACK BY AND VERIFIED WITH TOM Vladimir Lenhoff AT 2202 10/13/2019  TFK (NOTE) Elevated high sensitivity troponin I (hsTnI) values and significant  changes across serial measurements may suggest ACS but many other  chronic and acute conditions are known to elevate hsTnI results.  Refer to the "Links" section for chest pain algorithms and additional  guidance. Performed at [facility], [address] [place], [place] [address]   Influenza panel by PCR (type A & B)     Status: None   Collection Time: 10/04/2019 10:48 PM  Result Value Ref Range   Influenza A By PCR NEGATIVE NEGATIVE   Influenza B By PCR NEGATIVE NEGATIVE    Comment: (NOTE) The Xpert Xpress Flu assay is intended as an aid in the diagnosis of  influenza and should not be used as a sole basis for treatment.  This  assay is FDA approved for nasopharyngeal swab specimens only. Nasal  washings and aspirates are unacceptable for Xpert Xpress Flu testing. Performed at [facility], [address] [place], [place] [address]   Brain natriuretic peptide     Status: Abnormal   Collection Time: 09/27/2019 10:49 PM  Result Value Ref Range   B Natriuretic Peptide 425.0 (H) 0.0 - 100.0 pg/mL    Comment: Performed at [facility], [address] [place], [place] [address]  Protime-INR     Status: Abnormal   Collection Time: 10/06/2019 10:49 PM  Result  Value Ref Range   Prothrombin Time 16.2 (H) 11.4 - 15.2 seconds   INR 1.3 (H) 0.8 - 1.2    Comment: (NOTE) INR goal varies based on device and disease states. Performed at [facility], [address] [place], [place] [address]   APTT     Status: Abnormal   Collection Time: 10/26/2019 10:49 PM  Result Value Ref Range   aPTT 38 (H) 24 - 36 seconds    Comment:        IF BASELINE aPTT IS ELEVATED, SUGGEST PATIENT RISK ASSESSMENT BE USED TO DETERMINE APPROPRIATE ANTICOAGULANT THERAPY. Performed at [facility], [place], [place] [address]   Heparin level (unfractionated)     Status: Abnormal   Collection Time: 10/02/2019 10:49 PM  Result Value Ref Range   Heparin Unfractionated 0.25 (L) 0.30 - 0.70 IU/mL    Comment: (NOTE) If heparin results are below expected values, and patient dosage has  been confirmed, suggest follow up testing of antithrombin III levels. Performed at [facility], [place], [place] [address]    Ct Head Wo Contrast  Result Date: 10/10/2019 CLINICAL DATA:  Altered level of consciousness. EXAM: CT HEAD WITHOUT CONTRAST TECHNIQUE: Contiguous axial images were  obtained from the base of the skull through the vertex without intravenous contrast. COMPARISON:  August 06, 2019. FINDINGS: Brain: Mild chronic ischemic white matter disease is noted. No mass effect or midline shift is noted. Ventricular size is within normal limits. There is no evidence of mass lesion, hemorrhage or acute infarction. Vascular: No hyperdense vessel or unexpected calcification. Skull: Normal. Negative for fracture or focal lesion. Sinuses/Orbits: No acute finding. Other: None. IMPRESSION: Mild chronic ischemic white matter disease. No acute intracranial abnormality seen. Electronically Signed   By: Marijo Conception M.D.   On: 09/28/2019 18:02   Dg Chest Portable 1 View  Result Date: 10/10/2019 CLINICAL DATA:  Shortness of breath EXAM:  PORTABLE CHEST 1 VIEW COMPARISON:  10/11/2019 FINDINGS: Cardiomegaly. Both lungs are clear. The visualized skeletal structures are unremarkable. IMPRESSION: Cardiomegaly without acute abnormality of the lungs in AP portable projection. Electronically Signed   By: Eddie Candle M.D.   On: 10/15/2019 17:48    Pending Labs Unresulted Labs (From admission, onward)    Start     Ordered   10/15/19 0730  Heparin level (unfractionated)  Once-Timed,   STAT     10/19/2019 2345   10/15/19 0500  APTT  Tomorrow morning,   STAT     10/05/2019 2346   10/15/19 0500  Vancomycin, random  Tomorrow morning,   STAT     10/15/19 0025   10/06/2019 1814  Blood Culture (routine x 2)  BLOOD CULTURE X 2,   STAT     10/07/2019 1814   09/26/2019 1814  Urine culture  ONCE - STAT,   STAT     09/27/2019 1814   Signed and Held  Basic metabolic panel  Tomorrow morning,   R     Signed and Held   Signed and Held  CBC  Tomorrow morning,   R     Signed and Held          Vitals/Pain Today's Vitals   10/23/2019 2300 10/25/2019 2336 10/15/19 0000 10/15/19 0140  BP: (!) 103/37  (!) 117/58 104/69  Pulse: 72  64 78  Resp: (!) 27  (!) 25 19  Temp:      TempSrc:      SpO2: 99%  92% 96%  Weight:      Height:      PainSc:  0-No pain  0-No pain    Isolation Precautions No active isolations  Medications Medications  heparin ADULT infusion 100 units/mL (25000 units/210mL sodium chloride 0.45%) (750 Units/hr Intravenous New Bag/Given 10/24/2019 2344)  ceFEPIme (MAXIPIME) 2 g in sodium chloride 0.9 % 100 mL IVPB (has no administration in time range)  sodium chloride 0.9 % bolus 500 mL (0 mLs Intravenous Stopped 10/05/2019 1946)  ceFEPIme (MAXIPIME) 2 g in sodium chloride 0.9 % 100 mL IVPB (0 g Intravenous Stopped 10/03/2019 1934)  acetaminophen (TYLENOL) tablet 650 mg (650 mg Oral Given 10/05/2019 1846)  vancomycin (VANCOCIN) 1,500 mg in sodium chloride 0.9 % 500 mL IVPB (0 mg Intravenous Stopped 10/06/2019 2147)  heparin bolus via infusion  3,800 Units (3,800 Units Intravenous Bolus from Bag 10/26/2019 2337)    Mobility walks Low fall risk   Focused Assessments Cardiac Assessment Handoff:  Cardiac Rhythm: Normal sinus rhythm Lab Results  Component Value Date   TROPONINI 0.03 ([place]) 12/12/2017   No results found for: DDIMER Does the Patient currently have chest pain? No     R Recommendations: See Admitting Provider Note  Report given to: Marcella RN  on 2A  Additional Notes:

## 2019-10-15 NOTE — Progress Notes (Signed)
CRITICAL VALUE ALERT  Critical Value:  Troponin 473  Date & Time Notied: 10-15-19 1040a  Provider Notified: Dr Brett Albino  Orders Received/Actions taken: no new orders

## 2019-10-15 NOTE — Progress Notes (Signed)
ANTICOAGULATION CONSULT NOTE - Initial Consult  Pharmacy Consult for Heparin  Indication: chest pain/ACS  No Known Allergies  Patient Measurements: Height: 5' 2.5" (158.8 cm) Weight: 152 lb 11.2 oz (69.3 kg) IBW/kg (Calculated) : 51.25 Heparin Dosing Weight: 63.5 kg   Vital Signs: Temp: 99.2 F (37.3 C) (10/20 0759) Temp Source: Oral (10/20 0759) BP: 160/97 (10/20 0759) Pulse Rate: 69 (10/20 0800)  Labs: Recent Labs    10/25/2019 1732 09/28/2019 2115 10/07/2019 2249 10/15/19 0430 10/15/19 0742 10/15/19 0944  HGB 14.0  --   --  13.2  --   --   HCT 42.1  --   --  39.8  --   --   PLT 302  --   --  251  --   --   APTT  --   --  38* 64* 55*  --   LABPROT  --   --  16.2*  --   --   --   INR  --   --  1.3*  --   --   --   HEPARINUNFRC  --   --  0.25*  --  0.44  --   CREATININE 1.66*  --   --  1.45*  --   --   TROPONINIHS 99* 256*  --   --   --  473*    Estimated Creatinine Clearance: 29.1 mL/min (A) (by C-G formula based on SCr of 1.45 mg/dL (H)).   Medical History: Past Medical History:  Diagnosis Date  . Arthritis   . CAD   . Chronic diastolic CHF (congestive heart failure) ([place])   . CKD (chronic kidney disease), stage IV ([place])   . coronary artery disease   . History of kidney stones 01/2017  . Hypertension   . Hypothyroidism   . Mitral regurgitation and mitral stenosis   . PAF (paroxysmal atrial fibrillation) ([place])    a. On Eliquis; b. CHADS2VASc => 8 (CHF, HTN, age x 2, stroke x 2, vascular disease, female)  . Stroke [facility])    TIA 9/17 & 04/06/2017  . Tricuspid regurgitation     Medications:  Medications Prior to Admission  Medication Sig Dispense Refill Last Dose  . acetaminophen (TYLENOL) 500 MG tablet Take 1,000 mg by mouth daily as needed for moderate pain.    Unknown at PRN  . allopurinol (ZYLOPRIM) 100 MG tablet Take 100 mg by mouth daily.    09/28/2019 at 0800  . amLODipine (NORVASC) 5 MG tablet Take 1 tablet (5 mg total) by mouth daily. 30 tablet 6  10/19/2019 at 0800  . docusate sodium (COLACE) 100 MG capsule Take 1 capsule (100 mg total) by mouth 2 (two) times daily. (Patient taking differently: Take 100 mg by mouth daily as needed for moderate constipation. ) 10 capsule 0 Unknown at PRN  . ELIQUIS 5 MG TABS tablet Take 1 tablet by mouth twice daily 60 tablet 5 10/09/2019 at Unknown  . furosemide (LASIX) 40 MG tablet Take 1.5 tablets (60 mg total) by mouth 2 (two) times daily. 270 tablet 3 10/17/2019 at 0800  . levothyroxine (SYNTHROID, LEVOTHROID) 112 MCG tablet Take 112 mcg by mouth daily before breakfast.   10/10/2019 at 0800  . metoprolol tartrate (LOPRESSOR) 50 MG tablet Take 1 tablet (50 mg total) by mouth 2 (two) times daily. 180 tablet 3 10/26/2019 at 0800  . rosuvastatin (CRESTOR) 20 MG tablet Take 1 tablet (20 mg total) by mouth daily. 90 tablet 3 10/13/2019 at Unknown time  .  spironolactone (ALDACTONE) 25 MG tablet Take 0.5 tablets (12.5 mg total) by mouth daily. 45 tablet 3 10/26/2019 at 0800  . sildenafil (REVATIO) 20 MG tablet Take 1 tablet (20 mg total) by mouth 3 (three) times daily. (Patient not taking: Reported on 10/08/2019) 90 tablet 3 Not Taking at Unknown time    Assessment: Pharmacy consulted to dose heparin in this 78 year old female admitted with ACS/NSTEMI.  Pt was on Eliquis PTA, last dose was on 10/14 .  Eliquis was stopped due to surgical procedure scheduled for 10/20.  CrCl = 24.4 ml/min   Goal of Therapy:  APTT 66 - 102 seconds Heparin level 0.3-0.7 units/ml Monitor platelets by anticoagulation protocol: Yes   10/20 0430 APTT 64 (subtherapeutic) 10/20 0742 APTT 55 (subtherapeutic  10/20 0742 HL 0.44  Plan:  BL heparin level 0.25, and likely not falsely elevated by DOAC.  10/20 0742 HL 0.44.  Follow HL.  APTT only slightly subtherapeutic this morning ~64.  Therefore follow HL.  Will Continue current rate, and monitor HL at 1530 and CBC with AM labs  Gerald Dexter, PharmD Pharmacy Resident   10/15/2019 1:32 PM

## 2019-10-15 NOTE — Progress Notes (Signed)
Pharmacy Antibiotic Note  Caroline [name] is a 78 y.o. female admitted on 09/27/2019 with pneumonia.  Pharmacy has been consulted for vanc/cefepime dosing.  Plan: Patient received initially vanc dose of 1.5g IV load and cefepime 2g IV x 1 in ED w/ h/o of stage IV CKD not clear if patient is at baseline tonight, will check a random level w/ am labs and reassess renal function. Goal random < 20 mcg/mL.  Will continue cefepime 2g IV daily per CrCl 11 - 29 ml/min. Will continue to monitor renal function and adjust doses as necessary.  Height: 5' 2.5" (158.8 cm) Weight: 140 lb (63.5 kg) IBW/kg (Calculated) : 51.25  Temp (24hrs), Avg:100 F (37.8 C), Min:99.1 F (37.3 C), Max:100.9 F (38.3 C)  Recent Labs  Lab 10/11/19 1531 10/10/2019 1732 10/11/2019 1845 10/25/2019 2115  WBC 15.7* 18.8*  --   --   CREATININE 1.92* 1.66*  --   --   LATICACIDVEN  --   --  1.9 1.2    Estimated Creatinine Clearance: 24.4 mL/min (A) (by C-G formula based on SCr of 1.66 mg/dL (H)).    No Known Allergies  Thank you for allowing pharmacy to be a part of this patient's care.  Tobie Lords, PharmD, BCPS Clinical Pharmacist 10/15/2019 1:07 AM

## 2019-10-15 NOTE — Progress Notes (Addendum)
Patient admitted early this morning.  Seen and examined by me later in the morning.  See H&P for further details.  Patient states she is feeling okay this morning.  She denies any shortness of breath.  She denies any lower extremity edema.  No fevers, chills, cough.  On exam, she has a regular rate and rhythm.  Lungs are clear to auscultation bilaterally.  Sepsis-due to unknown etiology.  Meeting sepsis criteria on admission with fever, tachycardia, tachypnea, leukocytosis.  May be due to HCAP, as patient endorses shortness of breath. -Chest x-ray and UA were unremarkable -Repeat CXR in the morning -Continue cefepime for now -Check procalcitonin -Follow-up blood and urine cultures -RVP ordered  Elevated troponin- may be due to demand ischemia in the setting of sepsis.  Patient denies any active chest pain. -Cardiology consult -Continue heparin drip -VQ scan was negative for PE -Trend troponin -ECHO ordered  Paroxysmal atrial fibrillation with RVR- new this afternoon. -Will give a dose of IV diltiazem -Continue heparin gtt -Continue cardiac monitoring  Chronic diastolic CHF / mitral valve disease- patient was supposed to get a mitral valve replacement today. -ECHO  Hypokalemia -Replete and recheck  Hypothyroidism-stable -Continue home Synthroid  Hyperlipidemia-stable -Continue home Crestor  Husband, Joneen Boers, updated on the plan.  Hyman Bible, MD

## 2019-10-15 NOTE — Progress Notes (Signed)
Patient HR sustaining in 120's-130's as high as 140's at rest  , MD and cardio notified, orders given for 1x IV diltiazem, CXR, STAT troponin. IV diltiazem given, HR remains in 120's-130's. MD/cardio made aware. Cardio to come see patient.

## 2019-10-16 ENCOUNTER — Inpatient Hospital Stay: Payer: Medicare Other

## 2019-10-16 DIAGNOSIS — I469 Cardiac arrest, cause unspecified: Secondary | ICD-10-CM

## 2019-10-16 DIAGNOSIS — I5032 Chronic diastolic (congestive) heart failure: Secondary | ICD-10-CM | POA: Diagnosis not present

## 2019-10-16 DIAGNOSIS — R6510 Systemic inflammatory response syndrome (SIRS) of non-infectious origin without acute organ dysfunction: Secondary | ICD-10-CM | POA: Diagnosis not present

## 2019-10-16 LAB — BASIC METABOLIC PANEL
Anion gap: 12 (ref 5–15)
BUN: 25 mg/dL — ABNORMAL HIGH (ref 8–23)
CO2: 25 mmol/L (ref 22–32)
Calcium: 9.1 mg/dL (ref 8.9–10.3)
Chloride: 100 mmol/L (ref 98–111)
Creatinine, Ser: 1.46 mg/dL — ABNORMAL HIGH (ref 0.44–1.00)
GFR calc Af Amer: 39 mL/min — ABNORMAL LOW (ref >60)
GFR calc non Af Amer: 34 mL/min — ABNORMAL LOW (ref >60)
Glucose, Bld: 158 mg/dL — ABNORMAL HIGH (ref 70–99)
Potassium: 3.9 mmol/L (ref 3.5–5.1)
Sodium: 137 mmol/L (ref 135–145)

## 2019-10-16 LAB — CBC
HCT: 41.6 % (ref 36.0–46.0)
Hemoglobin: 13.6 g/dL (ref 12.0–15.0)
MCH: 31.6 pg (ref 26.0–34.0)
MCHC: 32.7 g/dL (ref 30.0–36.0)
MCV: 96.5 fL (ref 80.0–100.0)
Platelets: 310 10*3/uL (ref 150–400)
RBC: 4.31 MIL/uL (ref 3.87–5.11)
RDW: 13.7 % (ref 11.5–15.5)
WBC: 16.8 10*3/uL — ABNORMAL HIGH (ref 4.0–10.5)
nRBC: 0 % (ref 0.0–0.2)

## 2019-10-16 LAB — HEPARIN LEVEL (UNFRACTIONATED)
Heparin Unfractionated: 0.33 IU/mL (ref 0.30–0.70)
Heparin Unfractionated: 0.34 IU/mL (ref 0.30–0.70)

## 2019-10-16 LAB — VANCOMYCIN, RANDOM: Vancomycin Rm: 19

## 2019-10-16 MED ORDER — VANCOMYCIN HCL IN DEXTROSE 1-5 GM/200ML-% IV SOLN
1000.0000 mg | INTRAVENOUS | Status: DC
  Administered 2019-10-16: 1000 mg via INTRAVENOUS
  Filled 2019-10-16: qty 200

## 2019-10-17 ENCOUNTER — Telehealth: Payer: Self-pay | Admitting: Internal Medicine

## 2019-10-17 NOTE — Telephone Encounter (Signed)
Per Margie death certificate needs to be signed by the hospitalist.  [facility] and [place] and notified them.

## 2019-10-18 ENCOUNTER — Telehealth: Payer: Self-pay

## 2019-10-18 NOTE — Telephone Encounter (Signed)
Death certificate received from [employer]. Death certificate should go to Dr. Brett Albino  I have spoke to [place] with [employer] and made him aware.

## 2019-10-19 LAB — CULTURE, BLOOD (ROUTINE X 2)
Culture: NO GROWTH
Culture: NO GROWTH
Special Requests: ADEQUATE
Special Requests: ADEQUATE

## 2019-10-27 NOTE — Progress Notes (Signed)
Central tele called for me to check on patient as the rhythm showed ventricular fibrillation,upon arrival to the room found patient unresponsive and code blue called.ACLS protocols initiated at 08:45.Family notified. Resuscitation continued for 22  Without ROSC achieved and patient pronounced dead at 09:06.

## 2019-10-27 NOTE — ED Provider Notes (Signed)
[place]  Department of Emergency Medicine   Code Blue CONSULT NOTE  Chief Complaint: Cardiac arrest/unresponsive   Level V Caveat: Unresponsive  History of present illness: I was contacted by the hospital for a CODE BLUE cardiac arrest upstairs and presented to the patient's bedside.    ROS: Unable to obtain, Level V caveat  Scheduled Meds: . influenza vaccine adjuvanted  0.5 mL Intramuscular Tomorrow-1000  . levothyroxine  112 mcg Oral QAC breakfast  . metoprolol tartrate  25 mg Oral BID  . potassium chloride  40 mEq Oral BID  . rosuvastatin  20 mg Oral Daily  . senna-docusate  1 tablet Oral q morning - 10a   Continuous Infusions: . ceFEPime (MAXIPIME) IV 2 g (10/15/19 1801)  . heparin 750 Units/hr (11-07-19 0555)  . vancomycin 1,000 mg (Nov 07, 2019 0604)   PRN Meds:.acetaminophen **OR** acetaminophen, ondansetron **OR** ondansetron (ZOFRAN) IV, polyethylene glycol Past Medical History:  Diagnosis Date  . Arthritis   . CAD   . Chronic diastolic CHF (congestive heart failure) ([place])   . CKD (chronic kidney disease), stage IV ([place])   . coronary artery disease   . History of kidney stones 01/2017  . Hypertension   . Hypothyroidism   . Mitral regurgitation and mitral stenosis   . PAF (paroxysmal atrial fibrillation) ([place])    a. On Eliquis; b. CHADS2VASc => 8 (CHF, HTN, age x 2, stroke x 2, vascular disease, female)  . Stroke [facility])    TIA 9/17 & 04/06/2017  . Tricuspid regurgitation    Past Surgical History:  Procedure Laterality Date  . ABDOMINAL HYSTERECTOMY    . CARDIAC CATHETERIZATION     Negative   . COLONOSCOPY  2010  . PARTIAL HYSTERECTOMY    . RIGHT/LEFT HEART CATH AND CORONARY ANGIOGRAPHY N/A 08/02/2019   Procedure: RIGHT/LEFT HEART CATH AND CORONARY ANGIOGRAPHY;  Surgeon: Larey Dresser, MD;  Location: [facility];  Service: Cardiovascular;  Laterality: N/A;  . TEE WITHOUT CARDIOVERSION N/A 08/02/2019   Procedure: TRANSESOPHAGEAL  ECHOCARDIOGRAM (TEE);  Surgeon: Larey Dresser, MD;  Location: [facility] ENDOSCOPY;  Service: Cardiovascular;  Laterality: N/A;  . TONSILLECTOMY    . TOTAL HIP ARTHROPLASTY Left 08/08/2019   Procedure: TOTAL HIP ARTHROPLASTY ANTERIOR APPROACH;  Surgeon: Rod Can, MD;  Location: [facility];  Service: Orthopedics;  Laterality: Left;  . TOTAL KNEE ARTHROPLASTY Left 11/28/2017   Procedure: TOTAL KNEE ARTHROPLASTY;  Surgeon: Thornton Park, MD;  Location: [facility] ORS;  Service: Orthopedics;  Laterality: Left;   Social History   Socioeconomic History  . Marital status: Married    Spouse name: Not on file  . Number of children: Not on file  . Years of education: Not on file  . Highest education level: Not on file  Occupational History  . Occupation: retired  Scientific laboratory technician  . Financial resource strain: Not on file  . Food insecurity    Worry: Not on file    Inability: Not on file  . Transportation needs    Medical: Not on file    Non-medical: Not on file  Tobacco Use  . Smoking status: Never Smoker  . Smokeless tobacco: Never Used  Substance and Sexual Activity  . Alcohol use: Yes    Alcohol/week: 1.0 standard drinks    Types: 1 Glasses of wine per week    Comment: social   . Drug use: No  . Sexual activity: Not on file  Lifestyle  . Physical activity    Days per  week: Not on file    Minutes per session: Not on file  . Stress: Not on file  Relationships  . Social Herbalist on phone: Not on file    Gets together: Not on file    Attends religious service: Not on file    Active member of club or organization: Not on file    Attends meetings of clubs or organizations: Not on file    Relationship status: Not on file  . Intimate partner violence    Fear of current or ex partner: Not on file    Emotionally abused: Not on file    Physically abused: Not on file    Forced sexual activity: Not on file  Other Topics Concern  . Not on file  Social History Narrative  . Not on  file   No Known Allergies  Last set of Vital Signs (not current) Vitals:   November 15, 2019 0254 2019/11/15 0826  BP: 104/89 115/77  Pulse: (!) 103 61  Resp: 18 17  Temp: 98.5 F (36.9 C) 97.6 F (36.4 C)  SpO2: 100% 96%      Physical Exam  Gen: unresponsive Cardiovascular: pulseless  Resp: Agonal. Breath sounds equal bilaterally with bagging  Abd: nondistended  Neuro: GCS 3, unresponsive to pain  HEENT: No blood in posterior pharynx, gag reflex absent  Neck: No crepitus  Musculoskeletal: No deformity  Skin: warm  Procedures  INTUBATION Performed by: Carrie Mew Required items: required blood products, implants, devices, and special equipment available Patient identity confirmed: provided demographic data and hospital-assigned identification number Time out: Immediately prior to procedure a "time out" was called to verify the correct patient, procedure, equipment, support staff and site/side marked as required. Indications: Cardiac arrest, respiratory failure Intubation method: Orotracheal, glide scope Preoxygenation: BVM Sedatives: None Paralytic: Rocuronium Tube Size: 7.5 cuffed Post-procedure assessment: chest rise and ETCO2 monitor Breath sounds: equal and absent over the epigastrium Tube secured by Respiratory Therapy Patient tolerated the procedure well with no immediate complications.  CRITICAL CARE Performed by: Carrie Mew Total critical care time: 15 minutes Critical care time was exclusive of separately billable procedures and treating other patients. Critical care was necessary to treat or prevent imminent or life-threatening deterioration. Critical care was time spent personally by me on the following activities: development of treatment plan with patient and/or surrogate as well as nursing, discussions with consultants, evaluation of patient's response to treatment, examination of patient, obtaining history from patient or surrogate, ordering and  performing treatments and interventions, ordering and review of laboratory studies, ordering and review of radiographic studies, pulse oximetry and re-evaluation of patient's condition.  Cardiopulmonary Resuscitation (CPR) Procedure Note  Directed/Performed by: Carrie Mew I personally directed ancillary staff and/or performed CPR in an effort to regain return of spontaneous circulation and to maintain cardiac, neuro and systemic perfusion.   .Cardioversion  Date/Time: 15-Nov-2019 10:11 AM Performed by: Carrie Mew, MD Authorized by: Carrie Mew, MD   Consent:    Consent obtained:  Emergent situation Pre-procedure details:    Cardioversion basis:  Emergent   Pre-procedure rhythm: ventricular fibrillation.   Electrode placement:  Anterior-posterior Attempt one:    Cardioversion mode:  Asynchronous   Waveform:  Biphasic   Shock (Joules):  200   Shock outcome:  No change in rhythm Attempt two:    Cardioversion mode:  Asynchronous   Shock (Joules):  200   Shock outcome:  No change in rhythm Attempt three:    Cardioversion mode:  Asynchronous  Shock (Joules):  200   Shock outcome:  No change in rhythm Post-procedure details:    Patient status:  Unresponsive   Patient tolerance of procedure:  Tolerated well, no immediate complications     Medical Decision making  Cardiac arrest, see code documentation.  Multiple cardioversions attempted for ventricular fibrillation.  Amiodarone and epinephrine given.  Eventual rhythm of PEA despite successful intubation  Assessment and Plan  Care turned over to intensivist Dr. Mortimer Fries at bedside after intubation    Carrie Mew, MD 10/26/19 1013

## 2019-10-27 NOTE — Progress Notes (Signed)
PATIENT NAME: Caroline [name] RECORD NUMBER: [id number] Birthday: 08-Feb-1941  Age: 78 y.o. Admit Date: 10/18/2019  Provider: Mortimer Fries  Indication:cardiac arrest  Technical Description:   CPR performance duration: 22 mins  Was defibrillation or cardioversion used 5 times  Medications Administered Include      Yes/no Amiodarone YES  Atropin   Calcium   Epinephrine YES  Lidocaine   Magnesium   Norepinephrine   Phenylephrine   Sodium bicarbonate YES  Vasopression    Evaluation  Final Status - death Time of death 906AM  [name] November 05, 20209:09 AM     [name], M.D.  [name]  Medical Director [name] Director [name] Department

## 2019-10-27 NOTE — Progress Notes (Signed)
CRITICAL CARE NOTE  CC  Acute cardiac arrest  SUBJECTIVE Admitted for CHF CODE BLUE called this AM Patient underwent CPR and ACLS for approx 22 mins Patient was intubated  Nursing staff at bedside Multiple shocks given    BP 115/77 (BP Location: Left Arm)   Pulse 61   Temp 97.6 F (36.4 C) (Oral)   Resp 17   Ht 5' 2.5" (1.588 m)   Wt 69.3 kg   SpO2 96%   BMI 27.50 kg/m    I/O last 3 completed shifts: In: 1321.5 [I.V.:54.5; IV Piggyback:1267] Out: [id number] [Urine:1550] No intake/output data recorded.  SpO2: 96 % O2 Flow Rate (L/min): 2 L/min   SIGNIFICANT EVENTS   REVIEW OF SYSTEMS  PATIENT IS UNABLE TO PROVIDE COMPLETE REVIEW OF SYSTEMS DUE TO SEVERE CRITICAL ILLNESS   PHYSICAL EXAMINATION:  GENERAL:critically ill appearing, +resp distress blood from airways HEAD: Normocephalic, atraumatic.  PULMONARY: +rhonchi, +wheezing CARDIOVASCULAR: S1 and S2. Regular rate and rhythm. No murmurs, rubs, or gallops.  NEUROLOGIC: obtunded, GCS<8  MEDICATIONS: I have reviewed all medications and confirmed regimen as documented   CULTURE RESULTS   Recent Results (from the past 240 hour(s))  SARS CORONAVIRUS 2 (TAT 6-24 HRS) Nasopharyngeal Nasopharyngeal Swab     Status: None   Collection Time: 10/11/19 10:31 AM   Specimen: Nasopharyngeal Swab  Result Value Ref Range Status   SARS Coronavirus 2 NEGATIVE NEGATIVE Final    Comment: (NOTE) SARS-CoV-2 target nucleic acids are NOT DETECTED. The SARS-CoV-2 RNA is generally detectable in upper and lower respiratory specimens during the acute phase of infection. Negative results do not preclude SARS-CoV-2 infection, do not rule out co-infections with other pathogens, and should not be used as the sole basis for treatment or other patient management decisions. Negative results must be combined with clinical observations, patient history, and epidemiological information. The expected result is Negative. Fact Sheet for  Patients: SugarRoll.be Fact Sheet for Healthcare Providers: https://www.woods-mathews.com/ This test is not yet approved or cleared by the [place] FDA and  has been authorized for detection and/or diagnosis of SARS-CoV-2 by FDA under an Emergency Use Authorization (EUA). This EUA will remain  in effect (meaning this test can be used) for the duration of the COVID-19 declaration under Section 56 4(b)(1) of the Act, 21 U.S.C. section 360bbb-3(b)(1), unless the authorization is terminated or revoked sooner. Performed at [facility], [place] [address] [place], [place] [address]   Surgical pcr screen     Status: None   Collection Time: 10/11/19  3:32 PM   Specimen: Nasal Mucosa; Nasal Swab  Result Value Ref Range Status   MRSA, PCR NEGATIVE NEGATIVE Final   Staphylococcus aureus NEGATIVE NEGATIVE Final    Comment: (NOTE) The Xpert SA Assay (FDA approved for NASAL specimens in patients 44 years of age and older), is one component of a comprehensive surveillance program. It is not intended to diagnose infection nor to guide or monitor treatment. Performed at [facility], [place] [address], [place], [place] [address]   Urine culture     Status: None   Collection Time: 10/20/2019  6:25 PM   Specimen: Urine, Random  Result Value Ref Range Status   Specimen Description   Final    URINE, RANDOM Performed at [facility], [address] [place], [place] [address]    Special Requests   Final    NONE Performed at [facility], [address] [place], [place] [address]  Culture   Final    NO GROWTH Performed at [facility], [place] [address], [place], [place] [address]    Report Status 10/15/2019 FINAL  Final  Blood Culture (routine x 2)     Status: None (Preliminary result)   Collection Time: 10/10/2019  6:44 PM   Specimen: BLOOD  Result Value Ref Range Status   Specimen Description BLOOD RIGHT  ANTECUBITAL  Final   Special Requests   Final    BOTTLES DRAWN AEROBIC AND ANAEROBIC Blood Culture adequate volume   Culture   Final    NO GROWTH 2 DAYS Performed at [facility], [address] [place], [place] [address]    Report Status PENDING  Incomplete  Blood Culture (routine x 2)     Status: None (Preliminary result)   Collection Time: 10/21/2019  6:54 PM   Specimen: BLOOD  Result Value Ref Range Status   Specimen Description BLOOD BLOOD LEFT HAND  Final   Special Requests   Final    BOTTLES DRAWN AEROBIC ONLY Blood Culture adequate volume   Culture   Final    NO GROWTH 2 DAYS Performed at [facility], [address] [place],  [address]    Report Status PENDING  Incomplete  SARS Coronavirus 2 by RT PCR (hospital order, performed in [place] hospital lab) Nasopharyngeal Nasopharyngeal Swab     Status: None   Collection Time: 09/26/2019  7:13 PM   Specimen: Nasopharyngeal Swab  Result Value Ref Range Status   SARS Coronavirus 2 NEGATIVE NEGATIVE Final    Comment: (NOTE) If result is NEGATIVE SARS-CoV-2 target nucleic acids are NOT DETECTED. The SARS-CoV-2 RNA is generally detectable in upper and lower  respiratory specimens during the acute phase of infection. The lowest  concentration of SARS-CoV-2 viral copies this assay can detect is 250  copies / mL. A negative result does not preclude SARS-CoV-2 infection  and should not be used as the sole basis for treatment or other  patient management decisions.  A negative result may occur with  improper specimen collection / handling, submission of specimen other  than nasopharyngeal swab, presence of viral mutation(s) within the  areas targeted by this assay, and inadequate number of viral copies  (<250 copies / mL). A negative result must be combined with clinical  observations, patient history, and epidemiological information. If result is POSITIVE SARS-CoV-2 target nucleic acids are  DETECTED. The SARS-CoV-2 RNA is generally detectable in upper and lower  respiratory specimens dur ing the acute phase of infection.  Positive  results are indicative of active infection with SARS-CoV-2.  Clinical  correlation with patient history and other diagnostic information is  necessary to determine patient infection status.  Positive results do  not rule out bacterial infection or co-infection with other viruses. If result is PRESUMPTIVE POSTIVE SARS-CoV-2 nucleic acids MAY BE PRESENT.   A presumptive positive result was obtained on the submitted specimen  and confirmed on repeat testing.  While 2019 novel coronavirus  (SARS-CoV-2) nucleic acids may be present in the submitted sample  additional confirmatory testing may be necessary for epidemiological  and / or clinical management purposes  to differentiate between  SARS-CoV-2 and other Sarbecovirus currently known to infect humans.  If clinically indicated additional testing with an alternate test  methodology [phone]) is advised. The SARS-CoV-2 RNA is generally  detectable in upper and lower respiratory sp ecimens during the acute  phase of infection. The expected result is Negative. Fact  Sheet for Patients:  StrictlyIdeas.no Fact Sheet for Healthcare Providers: BankingDealers.co.za This test is not yet approved or cleared by the [place] FDA and has been authorized for detection and/or diagnosis of SARS-CoV-2 by FDA under an Emergency Use Authorization (EUA).  This EUA will remain in effect (meaning this test can be used) for the duration of the COVID-19 declaration under Section 564(b)(1) of the Act, 21 U.S.C. section 360bbb-3(b)(1), unless the authorization is terminated or revoked sooner. Performed at [facility], [place] [address] [place], [place] [address]   Respiratory Panel by PCR     Status: None   Collection Time: 10/15/19  4:43 PM   Specimen:  Nasopharyngeal Swab; Respiratory  Result Value Ref Range Status   Adenovirus NOT DETECTED NOT DETECTED Final   Coronavirus 229E NOT DETECTED NOT DETECTED Final    Comment: (NOTE) The Coronavirus on the Respiratory Panel, DOES NOT test for the novel  Coronavirus (2019 nCoV)    Coronavirus HKU1 NOT DETECTED NOT DETECTED Final   Coronavirus NL63 NOT DETECTED NOT DETECTED Final   Coronavirus OC43 NOT DETECTED NOT DETECTED Final   Metapneumovirus NOT DETECTED NOT DETECTED Final   Rhinovirus / Enterovirus NOT DETECTED NOT DETECTED Final   Influenza A NOT DETECTED NOT DETECTED Final   Influenza B NOT DETECTED NOT DETECTED Final   Parainfluenza Virus 1 NOT DETECTED NOT DETECTED Final   Parainfluenza Virus 2 NOT DETECTED NOT DETECTED Final   Parainfluenza Virus 3 NOT DETECTED NOT DETECTED Final   Parainfluenza Virus 4 NOT DETECTED NOT DETECTED Final   Respiratory Syncytial Virus NOT DETECTED NOT DETECTED Final   Bordetella pertussis NOT DETECTED NOT DETECTED Final   Chlamydophila pneumoniae NOT DETECTED NOT DETECTED Final   Mycoplasma pneumoniae NOT DETECTED NOT DETECTED Final    Comment: Performed at [facility], [place]. [address], [place], [place] [address]          IMAGING    Dg Chest 1 View  Result Date: 11/01/19 CLINICAL DATA:  Shortness of breath. EXAM: CHEST  1 VIEW COMPARISON:  Radiograph 10/08/2019 FINDINGS: Cardiomegaly. Aortic atherosclerosis with unchanged mediastinal contours. Progressive peribronchial thickening from prior exam. No focal airspace disease, large pleural effusion or pneumothorax. Chronic change about the left shoulder. IMPRESSION: 1. Progressive peribronchial thickening from prior exam, pulmonary edema versus bronchitic. 2. Stable cardiomegaly. Aortic Atherosclerosis (ICD10-I70.0). Electronically Signed   By: Keith Rake M.D.   On: 11/01/19 05:33   Nm Pulmonary Perf And Vent  Result Date: 10/15/2019 CLINICAL DATA:  Shortness of breath and  fever.  Confusion. EXAM: NUCLEAR MEDICINE VENTILATION - PERFUSION LUNG SCAN VIEWS: Anterior, posterior, left lateral, right lateral, RPO, LPO, RAO, LAO-ventilation and perfusion RADIOPHARMACEUTICALS:  32.4 mCi of Tc-29m DTPA aerosol inhalation and 4.45 mCi Tc31m MAA IV COMPARISON:  Chest radiograph October 14, 2019 FINDINGS: Ventilation: Radiotracer uptake is homogeneous and symmetric bilaterally. No appreciable ventilation defects. Perfusion: Radiotracer uptake is homogeneous and symmetric bilaterally. No appreciable perfusion defects. IMPRESSION: No appreciable ventilation or perfusion defects. Very low probability of pulmonary embolus. Electronically Signed   By: Lowella Grip III M.D.   On: 10/15/2019 13:52          ASSESSMENT AND PLAN SYNOPSIS   Severe ACUTE Hypoxic and Hypercapnic Respiratory Failure and cardiac arrest from acute CHF Patient underwent CPR and ACLS  Patient did not survive   Patient pronounced dead at [facility] Time devoted to patient care services described in this note is 32 minutes.  Overall, patient is critically ill  [name], M.D.  [name]  Medical Director [place] Director [facility] Cardio-Pulmonary Department

## 2019-10-27 NOTE — Progress Notes (Signed)
ANTICOAGULATION CONSULT NOTE - Initial Consult  Pharmacy Consult for Heparin  Indication: chest pain/ACS  No Known Allergies  Patient Measurements: Height: 5' 2.5" (158.8 cm) Weight: 152 lb 12.8 oz (69.3 kg) IBW/kg (Calculated) : 51.25 Heparin Dosing Weight: 63.5 kg   Vital Signs: Temp: 98.5 F (36.9 C) (10/21 0254) Temp Source: Oral (10/21 0254) BP: 104/89 (10/21 0254) Pulse Rate: 103 (10/21 0254)  Labs: Recent Labs    10/15/2019 1732 10/13/2019 2115  10/04/2019 2249 10/15/19 0430 10/15/19 0742 10/15/19 0944 10/15/19 1532 10/15/19 2346 11-04-2019 0425  HGB 14.0  --   --   --  13.2  --   --   --   --  13.6  HCT 42.1  --   --   --  39.8  --   --   --   --  41.6  PLT 302  --   --   --  251  --   --   --   --  310  APTT  --   --   --  38* 64* 55*  --   --   --   --   LABPROT  --   --   --  16.2*  --   --   --   --   --   --   INR  --   --   --  1.3*  --   --   --   --   --   --   HEPARINUNFRC  --   --    < > 0.25*  --  0.44  --  0.33 0.33 0.34  CREATININE 1.66*  --   --   --  1.45*  --   --   --   --  1.46*  TROPONINIHS 99* 256*  --   --   --   --  473* 399*  --   --    < > = values in this interval not displayed.    Estimated Creatinine Clearance: 28.9 mL/min (A) (by C-G formula based on SCr of 1.46 mg/dL (H)).   Medical History: Past Medical History:  Diagnosis Date  . Arthritis   . CAD   . Chronic diastolic CHF (congestive heart failure) ([place])   . CKD (chronic kidney disease), stage IV ([place])   . coronary artery disease   . History of kidney stones 01/2017  . Hypertension   . Hypothyroidism   . Mitral regurgitation and mitral stenosis   . PAF (paroxysmal atrial fibrillation) ([place])    a. On Eliquis; b. CHADS2VASc => 8 (CHF, HTN, age x 2, stroke x 2, vascular disease, female)  . Stroke [facility])    TIA 9/17 & 04/06/2017  . Tricuspid regurgitation     Medications:  Medications Prior to Admission  Medication Sig Dispense Refill Last Dose  . acetaminophen (TYLENOL)  500 MG tablet Take 1,000 mg by mouth daily as needed for moderate pain.    Unknown at PRN  . allopurinol (ZYLOPRIM) 100 MG tablet Take 100 mg by mouth daily.    10/15/2019 at 0800  . amLODipine (NORVASC) 5 MG tablet Take 1 tablet (5 mg total) by mouth daily. 30 tablet 6 10/18/2019 at 0800  . docusate sodium (COLACE) 100 MG capsule Take 1 capsule (100 mg total) by mouth 2 (two) times daily. (Patient taking differently: Take 100 mg by mouth daily as needed for moderate constipation. ) 10 capsule 0 Unknown at PRN  . ELIQUIS 5 MG  TABS tablet Take 1 tablet by mouth twice daily 60 tablet 5 10/09/2019 at Unknown  . furosemide (LASIX) 40 MG tablet Take 1.5 tablets (60 mg total) by mouth 2 (two) times daily. 270 tablet 3 09/28/2019 at 0800  . levothyroxine (SYNTHROID, LEVOTHROID) 112 MCG tablet Take 112 mcg by mouth daily before breakfast.   10/12/2019 at 0800  . metoprolol tartrate (LOPRESSOR) 50 MG tablet Take 1 tablet (50 mg total) by mouth 2 (two) times daily. 180 tablet 3 10/22/2019 at 0800  . rosuvastatin (CRESTOR) 20 MG tablet Take 1 tablet (20 mg total) by mouth daily. 90 tablet 3 10/13/2019 at Unknown time  . spironolactone (ALDACTONE) 25 MG tablet Take 0.5 tablets (12.5 mg total) by mouth daily. 45 tablet 3 10/11/2019 at 0800  . sildenafil (REVATIO) 20 MG tablet Take 1 tablet (20 mg total) by mouth 3 (three) times daily. (Patient not taking: Reported on 10/08/2019) 90 tablet 3 Not Taking at Unknown time    Assessment: Pharmacy consulted to dose heparin in this 78 year old female admitted with ACS/NSTEMI.  Pt was on Eliquis PTA, last dose was on 10/14 .  Eliquis was stopped due to surgical procedure scheduled for 10/20.  CrCl = 24.4 ml/min   Goal of Therapy:  APTT 66 - 102 seconds Heparin level 0.3-0.7 units/ml Monitor platelets by anticoagulation protocol: Yes   10/20 0430 APTT 64 (subtherapeutic) 10/20 0742 APTT 55 (subtherapeutic) 10/20 0742 HL 0.44 10/20 1532 HL 0.33 (therapeutic), as a  result of rate not being changed.   Plan:  10/21 @ 0500 HL 0.34 therapeutic. Will continue current rate and will recheck HL w/ am labs. CBC stable will continue to monitor.  Tobie Lords, PharmD, BCPS Clinical Pharmacist 11/10/2019 5:34 AM

## 2019-10-27 NOTE — Progress Notes (Signed)
Pharmacy Antibiotic Note  Caroline [name] is a 78 y.o. female admitted on 10/13/2019 with pneumonia.  Pharmacy has been consulted for vanc/cefepime dosing.  Plan: 10/21 @ 0500 VR 19 mcg/mL. Renal function appears to be stabilizing at Scr of 1.45 - 1.46, will start patient on scheduled vanc regimen as follows:  Vancomycin 1000 mg IV Q 48 hrs. Goal AUC 400-550. Expected AUC: 568.1 SCr used: 1.46 Cssmin: 12.4  Will continue cefepime 2g IV q24h and continue to monitor s/sx of infx and adjust per renal function.  Height: 5' 2.5" (158.8 cm) Weight: 152 lb 12.8 oz (69.3 kg) IBW/kg (Calculated) : 51.25  Temp (24hrs), Avg:98.8 F (37.1 C), Min:98.3 F (36.8 C), Max:99.7 F (37.6 C)  Recent Labs  Lab 10/11/19 1531 10/13/2019 1732 10/15/2019 1845 10/23/2019 2115 10/15/19 0430 10/19/19 0425  WBC 15.7* 18.8*  --   --  16.5* 16.8*  CREATININE 1.92* 1.66*  --   --  1.45* 1.46*  LATICACIDVEN  --   --  1.9 1.2  --   --   VANCORANDOM  --   --   --   --  18 19    Estimated Creatinine Clearance: 28.9 mL/min (A) (by C-G formula based on SCr of 1.46 mg/dL (H)).    No Known Allergies  Thank you for allowing pharmacy to be a part of this patient's care.  Tobie Lords, PharmD, BCPS Clinical Pharmacist 19-Oct-2019 5:35 AM

## 2019-10-27 NOTE — Death Summary Note (Signed)
DEATH SUMMARY   Patient Details  Name: Caroline [name] MRN: [id number] DOB: 23-Oct-1941  Admission/Discharge Information   Admit Date:  Oct 22, 2019  Date of Death: Date of Death: 2019/10/24  Time of Death: Time of Death: 0906  Length of Stay: 1  Referring Physician: Remi Haggard, FNP   Reason(s) for Hospitalization  Hypoxia  Diagnoses  Preliminary cause of death: cardiac arrest / ventricular fibrillation Secondary Diagnoses (including complications and co-morbidities):  Principal Problem:   SIRS (systemic inflammatory response syndrome) ([place]) Active Problems:   Mixed hyperlipidemia   Paroxysmal atrial fibrillation (HCC)   Chronic diastolic CHF (congestive heart failure) ([place])   Hypothyroidism   Essential hypertension   Coronary artery disease involving native coronary artery of native heart without angina pectoris   CKD (chronic kidney disease) stage 3, GFR 30-59 ml/min   Elevated troponin   Chronic heart failure with preserved ejection fraction (HFpEF) [facility])   Brief Hospital Course (including significant findings, care, treatment, and services provided and events leading to death)  Caroline [name] is a 78 y.o. year old female who presented to the ED with shortness of breath.  In the ED, she was hypoxic and somewhat confused.  She was meeting sepsis criteria with fever, tachycardia, tachypnea, and leukocytosis.  She was felt to have possible HCAP.  Blood and urine cultures were ordered.  Patient was started on empiric vancomycin and cefepime.  RVP was negative.  Blood and urine cultures were ultimately negative.  There was some concern that patient may have a possible PE, as she had been off her home Eliquis in anticipation of a scheduled CABG and mitral valve replacement on 10/20.  VQ scan was ultimately negative.  She was noted to have an elevated troponin.  She was treated with a heparin drip.  She was evaluated by cardiology, who did not feel that she needed any inpatient  cardiac procedures.  She had an echo performed, which did not show any new wall motion abnormalities.  On the morning of 2023-10-24, she went into ventricular fibrillation.  She became unresponsive and CODE BLUE was called.  ACLS was initiated.  Cardioversion was attempted 5 times without success.  She was emergently intubated. Patient eventually went into PEA.  CPR was performed for 22 minutes before ACLS was stopped.  Patient was pronounced dead at 09:06.  Pertinent Labs and Studies  Significant Diagnostic Studies Dg Chest 1 View  Result Date: 24-Oct-2019 CLINICAL DATA:  Shortness of breath. EXAM: CHEST  1 VIEW COMPARISON:  Radiograph 10-22-19 FINDINGS: Cardiomegaly. Aortic atherosclerosis with unchanged mediastinal contours. Progressive peribronchial thickening from prior exam. No focal airspace disease, large pleural effusion or pneumothorax. Chronic change about the left shoulder. IMPRESSION: 1. Progressive peribronchial thickening from prior exam, pulmonary edema versus bronchitic. 2. Stable cardiomegaly. Aortic Atherosclerosis (ICD10-I70.0). Electronically Signed   By: Keith Rake M.D.   On: 10/24/2019 05:33   Dg Chest 2 View  Result Date: 10/11/2019 CLINICAL DATA:  Mitral valve insufficiency. Atrial fibrillation. Coronary artery disease. Pre-op respiratory exam EXAM: CHEST - 2 VIEW COMPARISON:  08/06/2019 FINDINGS: Stable mild cardiomegaly. Aortic atherosclerosis. Both lungs are clear. The visualized skeletal structures are unremarkable. IMPRESSION: No active cardiopulmonary disease. Electronically Signed   By: Marlaine Hind M.D.   On: 10/11/2019 15:56   Ct Head Wo Contrast  Result Date: 10-22-19 CLINICAL DATA:  Altered level of consciousness. EXAM: CT HEAD WITHOUT CONTRAST TECHNIQUE: Contiguous axial images were obtained from the base of the skull through the vertex without intravenous  contrast. COMPARISON:  August 06, 2019. FINDINGS: Brain: Mild chronic ischemic white matter disease is  noted. No mass effect or midline shift is noted. Ventricular size is within normal limits. There is no evidence of mass lesion, hemorrhage or acute infarction. Vascular: No hyperdense vessel or unexpected calcification. Skull: Normal. Negative for fracture or focal lesion. Sinuses/Orbits: No acute finding. Other: None. IMPRESSION: Mild chronic ischemic white matter disease. No acute intracranial abnormality seen. Electronically Signed   By: Marijo Conception M.D.   On: 10/18/2019 18:02   Nm Pulmonary Perf And Vent  Result Date: 10/15/2019 CLINICAL DATA:  Shortness of breath and fever.  Confusion. EXAM: NUCLEAR MEDICINE VENTILATION - PERFUSION LUNG SCAN VIEWS: Anterior, posterior, left lateral, right lateral, RPO, LPO, RAO, LAO-ventilation and perfusion RADIOPHARMACEUTICALS:  32.4 mCi of Tc-86m DTPA aerosol inhalation and 4.45 mCi Tc49m MAA IV COMPARISON:  Chest radiograph October 14, 2019 FINDINGS: Ventilation: Radiotracer uptake is homogeneous and symmetric bilaterally. No appreciable ventilation defects. Perfusion: Radiotracer uptake is homogeneous and symmetric bilaterally. No appreciable perfusion defects. IMPRESSION: No appreciable ventilation or perfusion defects. Very low probability of pulmonary embolus. Electronically Signed   By: Lowella Grip III M.D.   On: 10/15/2019 13:52   Dg Chest Portable 1 View  Result Date: 10/26/2019 CLINICAL DATA:  Shortness of breath EXAM: PORTABLE CHEST 1 VIEW COMPARISON:  10/11/2019 FINDINGS: Cardiomegaly. Both lungs are clear. The visualized skeletal structures are unremarkable. IMPRESSION: Cardiomegaly without acute abnormality of the lungs in AP portable projection. Electronically Signed   By: Eddie Candle M.D.   On: 10/15/2019 17:48   Vas US Doppler Pre Cabg  Result Date: 10/12/2019 PREOPERATIVE VASCULAR EVALUATION  Indications:      Pre MVR. Comparison Study: no prior Performing Technologist: June Leap Rvt, Rdms  Examination Guidelines: A complete  evaluation includes B-mode imaging, spectral Doppler, color Doppler, and power Doppler as needed of all accessible portions of each vessel. Bilateral testing is considered an integral part of a complete examination. Limited examinations for reoccurring indications may be performed as noted.  Right Carotid Findings: +----------+--------+--------+--------+------------+--------+           PSV cm/sEDV cm/sStenosisDescribe    Comments +----------+--------+--------+--------+------------+--------+ CCA Prox  67      12                                   +----------+--------+--------+--------+------------+--------+ CCA Distal66      11                                   +----------+--------+--------+--------+------------+--------+ ICA Prox  94      19      1-39%   heterogenous         +----------+--------+--------+--------+------------+--------+ ICA Distal76      19                                   +----------+--------+--------+--------+------------+--------+ ECA       619     30      >50%                         +----------+--------+--------+--------+------------+--------+ Portions of this table do not appear on this page. +----------+--------+-------+----------------+------------+           PSV cm/sEDV cmsDescribe  Arm Pressure +----------+--------+-------+----------------+------------+ [id number]     3      Multiphasic, [id number]          +----------+--------+-------+----------------+------------+ +---------+--------+--+--------+--+---------+ VertebralPSV cm/s82EDV cm/s13Antegrade +---------+--------+--+--------+--+---------+ Left Carotid Findings: +----------+--------+--------+--------+--------------------------+--------+           PSV cm/sEDV cm/sStenosisDescribe                  Comments +----------+--------+--------+--------+--------------------------+--------+ CCA Prox  80      12                                                  +----------+--------+--------+--------+--------------------------+--------+ CCA Distal83      14                                                 +----------+--------+--------+--------+--------------------------+--------+ ICA Prox  197     43      40-59%  heterogenous and irregular         +----------+--------+--------+--------+--------------------------+--------+ ICA Mid   224     35                                                 +----------+--------+--------+--------+--------------------------+--------+ ICA Distal133     29                                                 +----------+--------+--------+--------+--------------------------+--------+ ECA       133     8                                                  +----------+--------+--------+--------+--------------------------+--------+ +----------+--------+--------+--------------------+------------+ SubclavianPSV cm/sEDV cm/sDescribe            Arm Pressure +----------+--------+--------+--------------------+------------+           82              Monophasic/ dampened142          +----------+--------+--------+--------------------+------------+ +---------+--------+--+--------+----------+ VertebralPSV cm/s72EDV cm/sRetrograde +---------+--------+--+--------+----------+  Right Doppler Findings: +--------+--------+-----+---------+--------+ Site    PressureIndexDoppler  Comments +--------+--------+-----+---------+--------+ [id number]          triphasic         +--------+--------+-----+---------+--------+ Radial               triphasic         +--------+--------+-----+---------+--------+ Ulnar                triphasic         +--------+--------+-----+---------+--------+  Left Doppler Findings: +--------+--------+-----+----------+--------+ Site    PressureIndexDoppler   Comments +--------+--------+-----+----------+--------+ [id number]          monophasic          +--------+--------+-----+----------+--------+ Radial               monophasic         +--------+--------+-----+----------+--------+ Ulnar  monophasic         +--------+--------+-----+----------+--------+  Summary: Right Carotid: Velocities in the right ICA are consistent with a 1-39% stenosis.                The ECA appears >50% stenosed. Left Carotid: Velocities in the left ICA are consistent with a 40-59% stenosis. Vertebrals:  Right vertebral artery demonstrates antegrade flow. Left vertebral              artery demonstrates retrograde flow. Subclavians: Normal flow hemodynamics were seen in the right subclavian artery.              Left subclavian monophasic, indicating a more proximal stenosis. Right Upper Extremity: Doppler waveforms remain within normal limits with right radial compression. Doppler waveforms remain within normal limits with right ulnar compression. Left Upper Extremity: Doppler waveforms remain within normal limits with left radial compression. Doppler waveform obliterate with left ulnar compression.  Electronically signed by Harold Barban MD on 10/12/2019 at 12:43:48 AM.    Final     Microbiology Recent Results (from the past 240 hour(s))  SARS CORONAVIRUS 2 (TAT 6-24 HRS) Nasopharyngeal Nasopharyngeal Swab     Status: None   Collection Time: 10/11/19 10:31 AM   Specimen: Nasopharyngeal Swab  Result Value Ref Range Status   SARS Coronavirus 2 NEGATIVE NEGATIVE Final    Comment: (NOTE) SARS-CoV-2 target nucleic acids are NOT DETECTED. The SARS-CoV-2 RNA is generally detectable in upper and lower respiratory specimens during the acute phase of infection. Negative results do not preclude SARS-CoV-2 infection, do not rule out co-infections with other pathogens, and should not be used as the sole basis for treatment or other patient management decisions. Negative results must be combined with clinical observations, patient history, and epidemiological  information. The expected result is Negative. Fact Sheet for Patients: SugarRoll.be Fact Sheet for Healthcare Providers: https://www.woods-mathews.com/ This test is not yet approved or cleared by the [place] FDA and  has been authorized for detection and/or diagnosis of SARS-CoV-2 by FDA under an Emergency Use Authorization (EUA). This EUA will remain  in effect (meaning this test can be used) for the duration of the COVID-19 declaration under Section 56 4(b)(1) of the Act, 21 U.S.C. section 360bbb-3(b)(1), unless the authorization is terminated or revoked sooner. Performed at [facility], [place] [address], [place], [place] [address]   Surgical pcr screen     Status: None   Collection Time: 10/11/19  3:32 PM   Specimen: Nasal Mucosa; Nasal Swab  Result Value Ref Range Status   MRSA, PCR NEGATIVE NEGATIVE Final   Staphylococcus aureus NEGATIVE NEGATIVE Final    Comment: (NOTE) The Xpert SA Assay (FDA approved for NASAL specimens in patients 65 years of age and older), is one component of a comprehensive surveillance program. It is not intended to diagnose infection nor to guide or monitor treatment. Performed at [facility], [place] [address], [place], [place] [address]   Urine culture     Status: None   Collection Time: 10/13/2019  6:25 PM   Specimen: Urine, Random  Result Value Ref Range Status   Specimen Description   Final    URINE, RANDOM Performed at [facility], [address] [place], [place] [address]    Special Requests   Final    NONE Performed at [facility], [address], [place], [place] [address]    Culture   Final    NO GROWTH Performed at [facility], [place] [address] [place], [place] [address]    Report Status 10/15/2019 FINAL  Final  Blood Culture (routine x 2)     Status: None (Preliminary result)   Collection Time: 10/11/2019  6:44 PM   Specimen: BLOOD  Result Value  Ref Range Status   Specimen Description BLOOD RIGHT ANTECUBITAL  Final   Special Requests   Final    BOTTLES DRAWN AEROBIC AND ANAEROBIC Blood Culture adequate volume   Culture   Final    NO GROWTH 2 DAYS Performed at [facility], [address] [place], [place] [address]    Report Status PENDING  Incomplete  Blood Culture (routine x 2)     Status: None (Preliminary result)   Collection Time: 10/11/2019  6:54 PM   Specimen: BLOOD  Result Value Ref Range Status   Specimen Description BLOOD BLOOD LEFT HAND  Final   Special Requests   Final    BOTTLES DRAWN AEROBIC ONLY Blood Culture adequate volume   Culture   Final    NO GROWTH 2 DAYS Performed at [facility], [address], [place], [place] [address]    Report Status PENDING  Incomplete  SARS Coronavirus 2 by RT PCR (hospital order, performed in [place] hospital lab) Nasopharyngeal Nasopharyngeal Swab     Status: None   Collection Time: 10/10/2019  7:13 PM   Specimen: Nasopharyngeal Swab  Result Value Ref Range Status   SARS Coronavirus 2 NEGATIVE NEGATIVE Final    Comment: (NOTE) If result is NEGATIVE SARS-CoV-2 target nucleic acids are NOT DETECTED. The SARS-CoV-2 RNA is generally detectable in upper and lower  respiratory specimens during the acute phase of infection. The lowest  concentration of SARS-CoV-2 viral copies this assay can detect is 250  copies / mL. A negative result does not preclude SARS-CoV-2 infection  and should not be used as the sole basis for treatment or other  patient management decisions.  A negative result may occur with  improper specimen collection / handling, submission of specimen other  than nasopharyngeal swab, presence of viral mutation(s) within the  areas targeted by this assay, and inadequate number of viral copies  (<250 copies / mL). A negative result must be combined with clinical  observations, patient history, and epidemiological information. If result is  POSITIVE SARS-CoV-2 target nucleic acids are DETECTED. The SARS-CoV-2 RNA is generally detectable in upper and lower  respiratory specimens dur ing the acute phase of infection.  Positive  results are indicative of active infection with SARS-CoV-2.  Clinical  correlation with patient history and other diagnostic information is  necessary to determine patient infection status.  Positive results do  not rule out bacterial infection or co-infection with other viruses. If result is PRESUMPTIVE POSTIVE SARS-CoV-2 nucleic acids MAY BE PRESENT.   A presumptive positive result was obtained on the submitted specimen  and confirmed on repeat testing.  While 2019 novel coronavirus  (SARS-CoV-2) nucleic acids may be present in the submitted sample  additional confirmatory testing may be necessary for epidemiological  and / or clinical management purposes  to differentiate between  SARS-CoV-2 and other Sarbecovirus currently known to infect humans.  If clinically indicated additional testing with an alternate test  methodology [phone]) is advised. The SARS-CoV-2 RNA is generally  detectable in upper and lower respiratory sp ecimens during the acute  phase of infection. The expected result is Negative. Fact Sheet for Patients:  StrictlyIdeas.no Fact Sheet for Healthcare Providers: BankingDealers.co.za This test  is not yet approved or cleared by the [place] and has been authorized for detection and/or diagnosis of SARS-CoV-2 by FDA under an Emergency Use Authorization (EUA).  This EUA will remain in effect (meaning this test can be used) for the duration of the COVID-19 declaration under Section 564(b)(1) of the Act, 21 U.S.C. section 360bbb-3(b)(1), unless the authorization is terminated or revoked sooner. Performed at [facility], [place] [place], [place] [address]   Respiratory Panel by PCR     Status: None    Collection Time: 10/15/19  4:43 PM   Specimen: Nasopharyngeal Swab; Respiratory  Result Value Ref Range Status   Adenovirus NOT DETECTED NOT DETECTED Final   Coronavirus 229E NOT DETECTED NOT DETECTED Final    Comment: (NOTE) The Coronavirus on the Respiratory Panel, DOES NOT test for the novel  Coronavirus (2019 nCoV)    Coronavirus HKU1 NOT DETECTED NOT DETECTED Final   Coronavirus NL63 NOT DETECTED NOT DETECTED Final   Coronavirus OC43 NOT DETECTED NOT DETECTED Final   Metapneumovirus NOT DETECTED NOT DETECTED Final   Rhinovirus / Enterovirus NOT DETECTED NOT DETECTED Final   Influenza A NOT DETECTED NOT DETECTED Final   Influenza B NOT DETECTED NOT DETECTED Final   Parainfluenza Virus 1 NOT DETECTED NOT DETECTED Final   Parainfluenza Virus 2 NOT DETECTED NOT DETECTED Final   Parainfluenza Virus 3 NOT DETECTED NOT DETECTED Final   Parainfluenza Virus 4 NOT DETECTED NOT DETECTED Final   Respiratory Syncytial Virus NOT DETECTED NOT DETECTED Final   Bordetella pertussis NOT DETECTED NOT DETECTED Final   Chlamydophila pneumoniae NOT DETECTED NOT DETECTED Final   Mycoplasma pneumoniae NOT DETECTED NOT DETECTED Final    Comment: Performed at [facility], [place]. [address], [place], [place] [address]    Lab Basic Metabolic Panel: Recent Labs  Lab 10/11/19 1531 10/22/2019 1732 10/15/19 0430 10/15/19 0944 Nov 08, 2019 0425  NA 138 135 138  --  137  K 3.4* 4.0 2.8*  --  3.9  CL 95* 92* 98  --  100  CO2 24 28 28   --  25  GLUCOSE 91 103* 116*  --  158*  BUN 29* 29* 25*  --  25*  CREATININE 1.92* 1.66* 1.45*  --  1.46*  CALCIUM 9.9 9.1 8.7*  --  9.1  MG  --   --   --  2.6*  --    Liver Function Tests: Recent Labs  Lab 10/11/19 1531 10/06/2019 1732  AST 28 27  ALT 20 15  ALKPHOS 111 98  BILITOT 0.6 1.0  PROT 7.6 7.5  ALBUMIN 3.8 3.4*   No results for input(s): LIPASE, AMYLASE in the last 168 hours. No results for input(s): AMMONIA in the last 168 hours. CBC: Recent Labs   Lab 10/11/19 1531 09/29/2019 1732 10/15/19 0430 11-08-2019 0425  WBC 15.7* 18.8* 16.5* 16.8*  NEUTROABS  --  12.3*  --   --   HGB 15.7* 14.0 13.2 13.6  HCT 49.2* 42.1 39.8 41.6  MCV 101.7* 95.5 98.3 96.5  PLT 328 302 251 310   Cardiac Enzymes: No results for input(s): CKTOTAL, CKMB, CKMBINDEX, TROPONINI in the last 168 hours. Sepsis Labs: Recent Labs  Lab 10/11/19 1531 10/22/2019 1732 10/15/2019 1845 10/06/2019 2115 10/15/19 0430 10/15/19 1536 Nov 08, 2019 0425  PROCALCITON  --   --   --   --   --  0.28  --   WBC 15.7* 18.8*  --   --  16.5*  --  16.8*  LATICACIDVEN  --   --  1.9 1.2  --   --   --       Valetta Fuller D Carinna Newhart 2019-10-26, 3:15 PM

## 2019-10-27 NOTE — Progress Notes (Signed)
ANTICOAGULATION CONSULT NOTE - Initial Consult  Pharmacy Consult for Heparin  Indication: chest pain/ACS  No Known Allergies  Patient Measurements: Height: 5' 2.5" (158.8 cm) Weight: 152 lb 11.2 oz (69.3 kg) IBW/kg (Calculated) : 51.25 Heparin Dosing Weight: 63.5 kg   Vital Signs: Temp: 98.6 F (37 C) (10/20 2025) Temp Source: Oral (10/20 2025) BP: 151/76 (10/20 2025) Pulse Rate: 79 (10/20 2025)  Labs: Recent Labs    10/07/2019 1732 09/29/2019 2115  10/04/2019 2249 10/15/19 0430 10/15/19 0742 10/15/19 0944 10/15/19 1532 10/15/19 2346  HGB 14.0  --   --   --  13.2  --   --   --   --   HCT 42.1  --   --   --  39.8  --   --   --   --   PLT 302  --   --   --  251  --   --   --   --   APTT  --   --   --  38* 64* 55*  --   --   --   LABPROT  --   --   --  16.2*  --   --   --   --   --   INR  --   --   --  1.3*  --   --   --   --   --   HEPARINUNFRC  --   --    < > 0.25*  --  0.44  --  0.33 0.33  CREATININE 1.66*  --   --   --  1.45*  --   --   --   --   TROPONINIHS 99* 256*  --   --   --   --  473* 399*  --    < > = values in this interval not displayed.    Estimated Creatinine Clearance: 29.1 mL/min (A) (by C-G formula based on SCr of 1.45 mg/dL (H)).   Medical History: Past Medical History:  Diagnosis Date  . Arthritis   . CAD   . Chronic diastolic CHF (congestive heart failure) ([place])   . CKD (chronic kidney disease), stage IV ([place])   . coronary artery disease   . History of kidney stones 01/2017  . Hypertension   . Hypothyroidism   . Mitral regurgitation and mitral stenosis   . PAF (paroxysmal atrial fibrillation) ([place])    a. On Eliquis; b. CHADS2VASc => 8 (CHF, HTN, age x 2, stroke x 2, vascular disease, female)  . Stroke [facility])    TIA 9/17 & 04/06/2017  . Tricuspid regurgitation     Medications:  Medications Prior to Admission  Medication Sig Dispense Refill Last Dose  . acetaminophen (TYLENOL) 500 MG tablet Take 1,000 mg by mouth daily as needed for  moderate pain.    Unknown at PRN  . allopurinol (ZYLOPRIM) 100 MG tablet Take 100 mg by mouth daily.    10/04/2019 at 0800  . amLODipine (NORVASC) 5 MG tablet Take 1 tablet (5 mg total) by mouth daily. 30 tablet 6 09/29/2019 at 0800  . docusate sodium (COLACE) 100 MG capsule Take 1 capsule (100 mg total) by mouth 2 (two) times daily. (Patient taking differently: Take 100 mg by mouth daily as needed for moderate constipation. ) 10 capsule 0 Unknown at PRN  . ELIQUIS 5 MG TABS tablet Take 1 tablet by mouth twice daily 60 tablet 5 10/09/2019 at Unknown  . furosemide (LASIX)  40 MG tablet Take 1.5 tablets (60 mg total) by mouth 2 (two) times daily. 270 tablet 3 09/26/2019 at 0800  . levothyroxine (SYNTHROID, LEVOTHROID) 112 MCG tablet Take 112 mcg by mouth daily before breakfast.   10/15/2019 at 0800  . metoprolol tartrate (LOPRESSOR) 50 MG tablet Take 1 tablet (50 mg total) by mouth 2 (two) times daily. 180 tablet 3 09/28/2019 at 0800  . rosuvastatin (CRESTOR) 20 MG tablet Take 1 tablet (20 mg total) by mouth daily. 90 tablet 3 10/13/2019 at Unknown time  . spironolactone (ALDACTONE) 25 MG tablet Take 0.5 tablets (12.5 mg total) by mouth daily. 45 tablet 3 10/24/2019 at 0800  . sildenafil (REVATIO) 20 MG tablet Take 1 tablet (20 mg total) by mouth 3 (three) times daily. (Patient not taking: Reported on 10/08/2019) 90 tablet 3 Not Taking at Unknown time    Assessment: Pharmacy consulted to dose heparin in this 78 year old female admitted with ACS/NSTEMI.  Pt was on Eliquis PTA, last dose was on 10/14 .  Eliquis was stopped due to surgical procedure scheduled for 10/20.  CrCl = 24.4 ml/min   Goal of Therapy:  APTT 66 - 102 seconds Heparin level 0.3-0.7 units/ml Monitor platelets by anticoagulation protocol: Yes   10/20 0430 APTT 64 (subtherapeutic) 10/20 0742 APTT 55 (subtherapeutic) 10/20 0742 HL 0.44 10/20 1532 HL 0.33 (therapeutic), as a result of rate not being changed.   Plan:  10/20 @ 2346  HL 0.33 therapeutic. Will continue current rate and will recheck HL w/ am labs.  Tobie Lords, PharmD, BCPS Clinical Pharmacist 10/26/19 12:56 AM

## 2019-10-27 DEATH — deceased

## 2019-11-11 ENCOUNTER — Encounter: Payer: Medicare Other | Admitting: Thoracic Surgery (Cardiothoracic Vascular Surgery)

## 2019-11-15 ENCOUNTER — Encounter (HOSPITAL_COMMUNITY): Payer: Medicare Other | Admitting: Cardiology

## 2020-03-25 IMAGING — DX DG CHEST 1V PORT
1 series · 1 of 1 positions shown · non-contrast
Comparison: 10/11/2019

CLINICAL DATA: Shortness of breath

EXAM:
PORTABLE CHEST 1 VIEW

[chest ap]
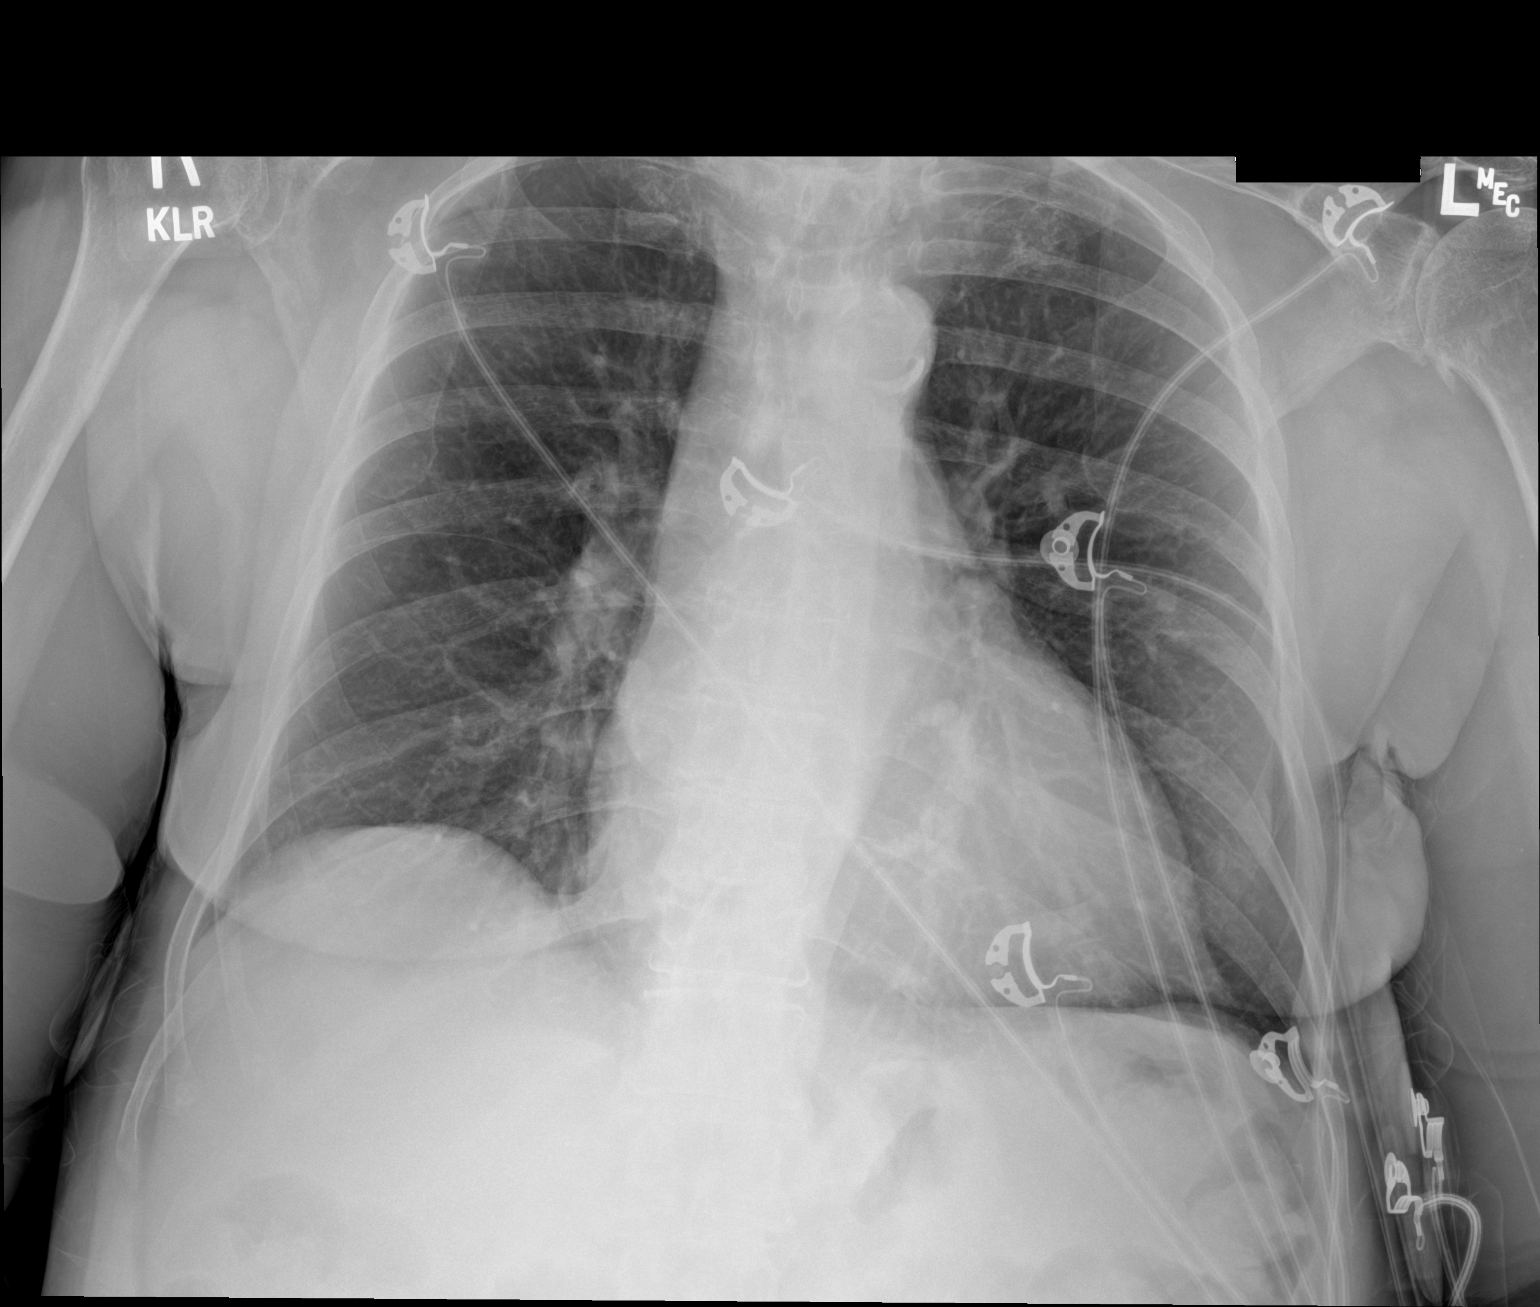

[1 of 1 positions shown; findings below may reference images not displayed]

FINDINGS: Cardiomegaly. Both lungs are clear. The visualized skeletal
structures are unremarkable.
IMPRESSION: Cardiomegaly without acute abnormality of the lungs in AP portable
projection.

## 2020-03-25 IMAGING — CT CT HEAD W/O CM
3 series · 16 of 47 positions shown, 19 images · non-contrast
Comparison: August 06, 2019.

CLINICAL DATA: Altered level of consciousness.

EXAM:
CT HEAD WITHOUT CONTRAST
TECHNIQUE: Contiguous axial images were obtained from the base of the skull
through the vertex without intravenous contrast.

[Series 2: head wo · axial · 0.47mm/px · z∈[-133,-8]mm · 10 of 31 slices shown, 13 images]
[im 3/31  brain]
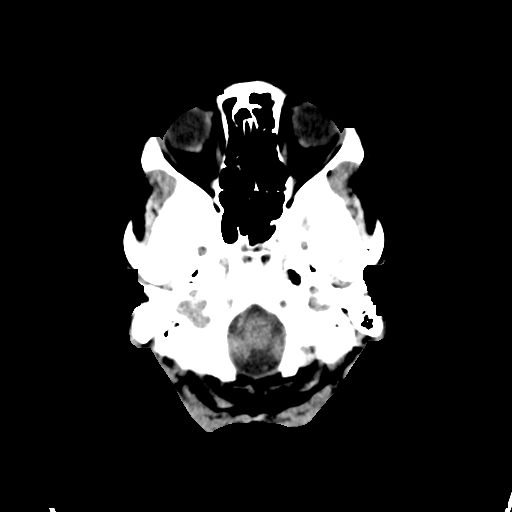
[im 3/31  bone]
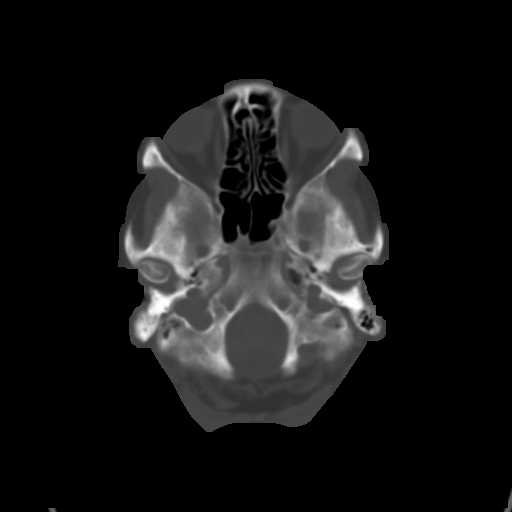
[im 6/31  brain]
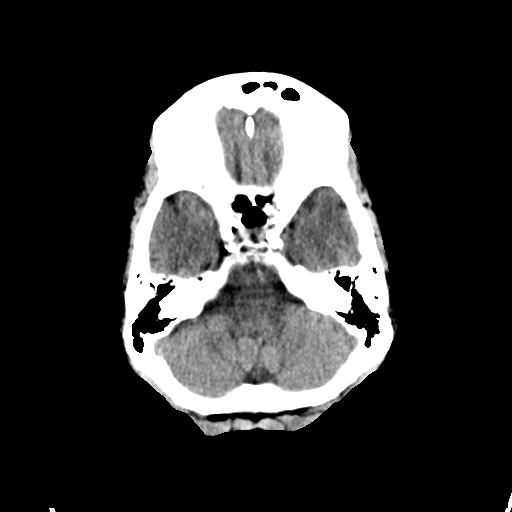
[im 9/31  brain]
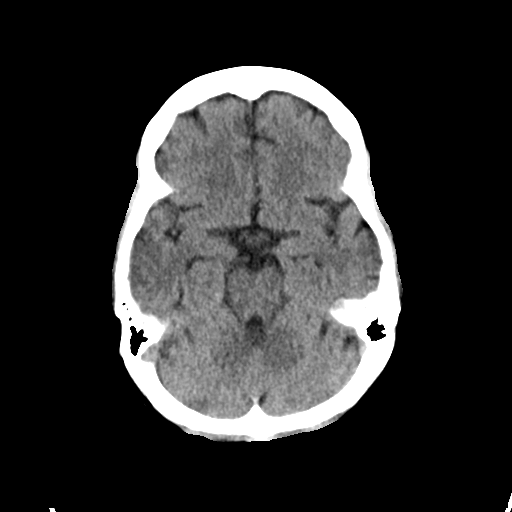
[im 11/31  brain]
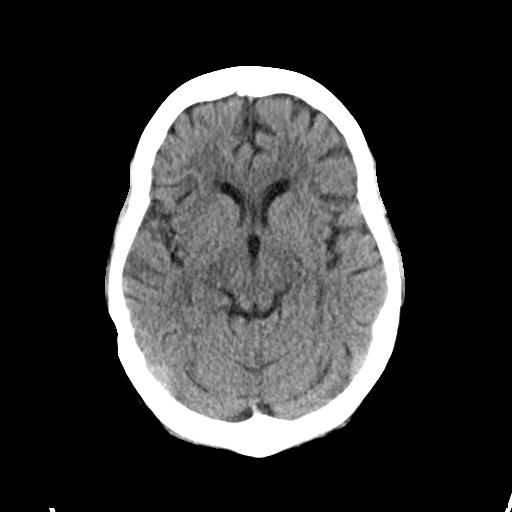
[im 14/31  brain]
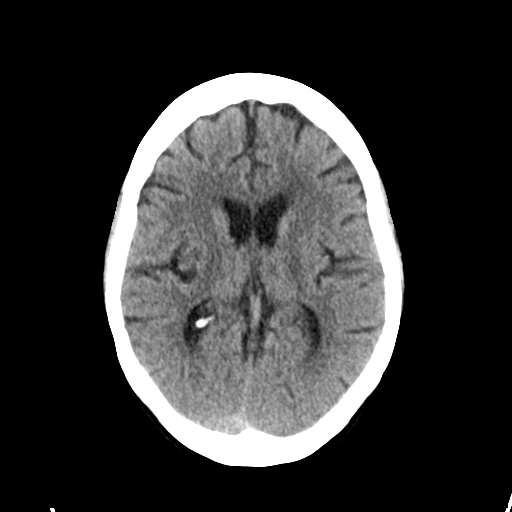
[im 14/31  bone]
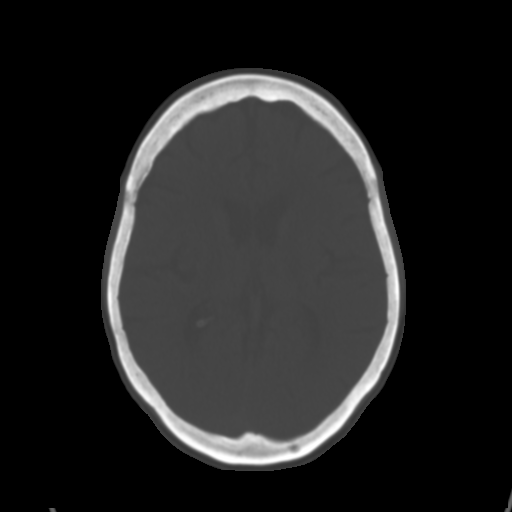
[im 17/31  brain]
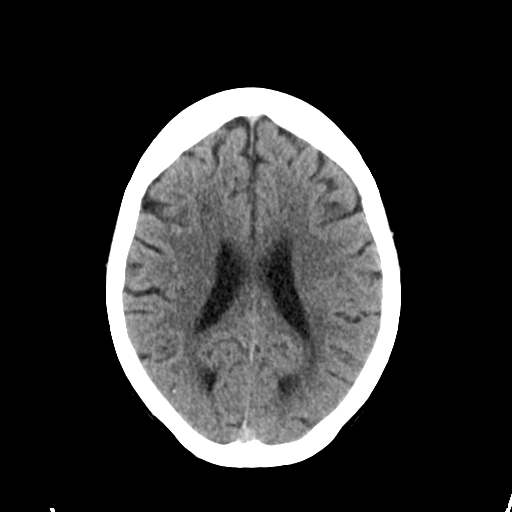
[im 20/31  brain]
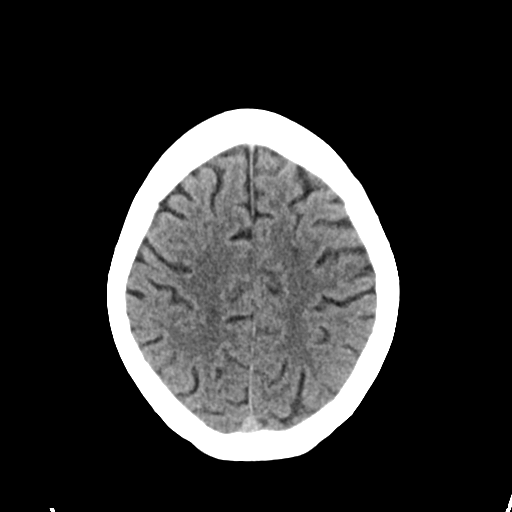
[im 23/31  brain]
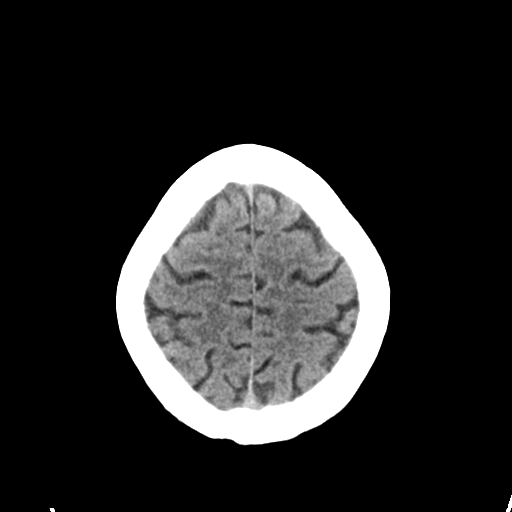
[im 25/31  brain]
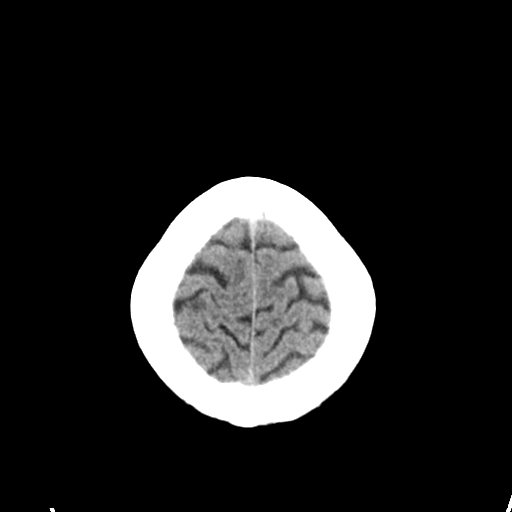
[im 25/31  bone]
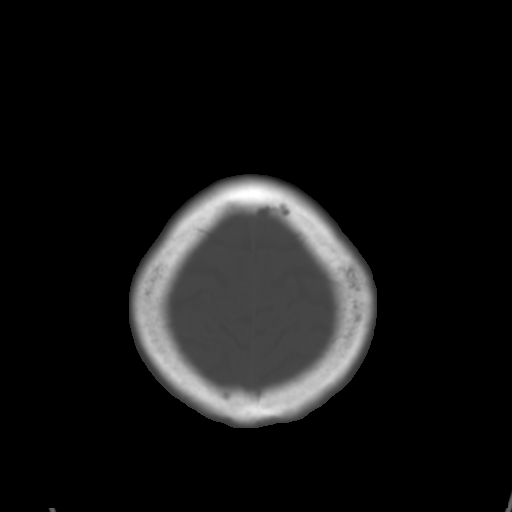
[im 28/31  brain]
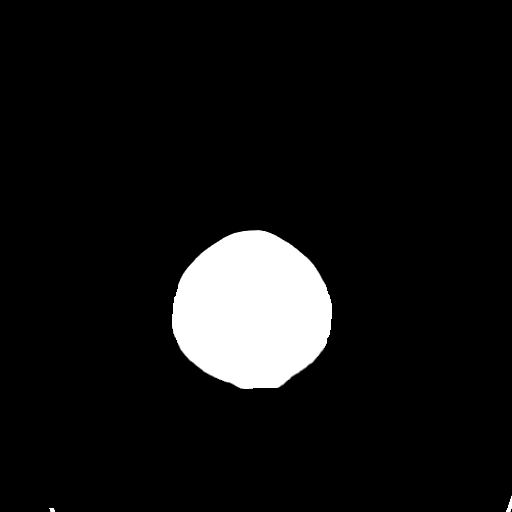

[Series 4: coronal soft tissue · coronal · 0.30mm/px · 3 of 62 slices shown]
[im 21/62  brain]
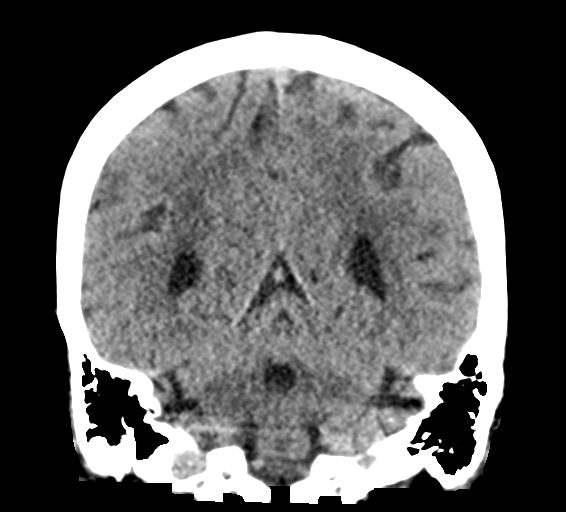
[im 28/62  brain]
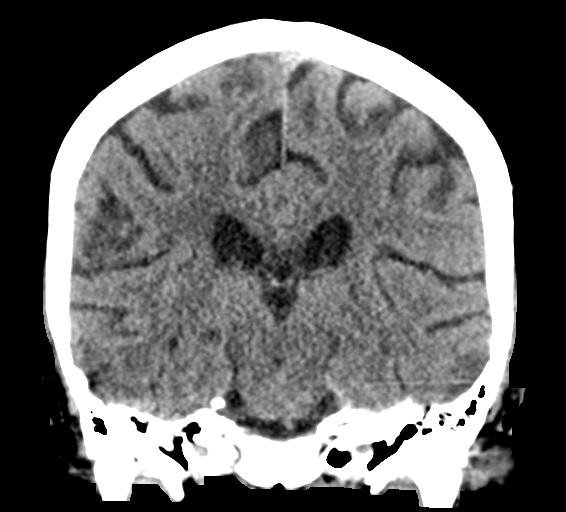
[im 34/62  brain]
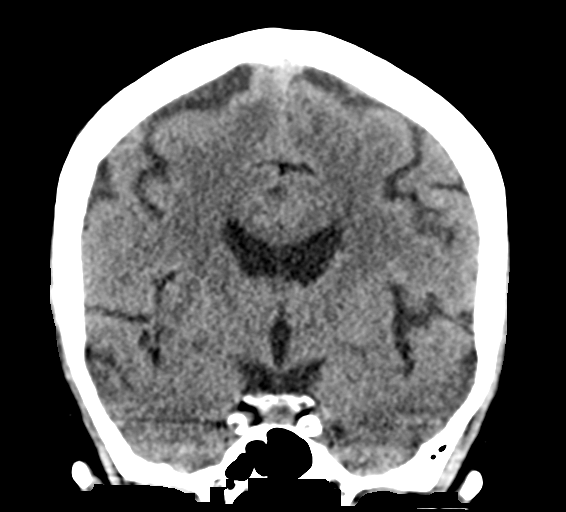

[Series 5: sagittal soft tissue · sagittal · 0.30mm/px · 3 of 50 slices shown]
[im 17/50  brain]
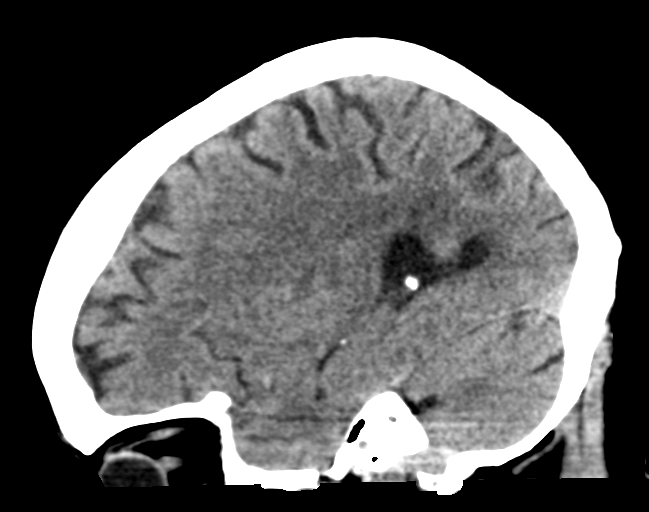
[im 25/50  brain]
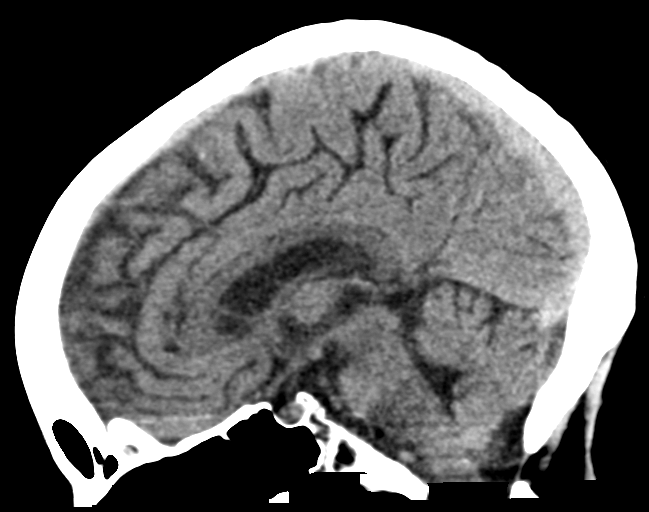
[im 33/50  brain]
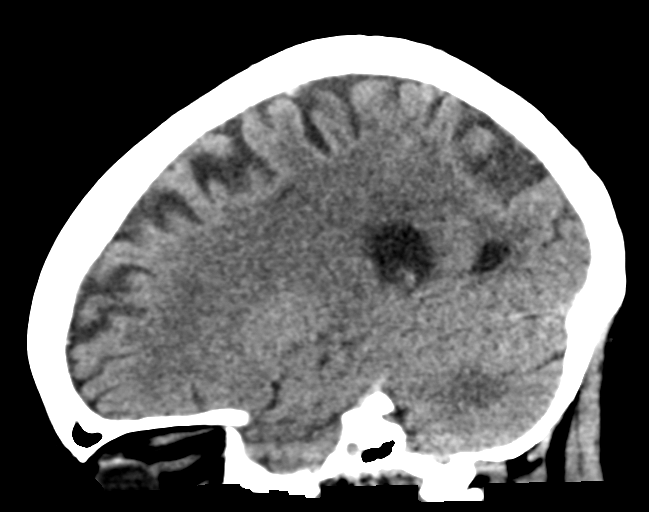

[16 of 47 positions shown; findings below may reference images not displayed]

FINDINGS: Brain: Mild chronic ischemic white matter disease is noted. No mass
effect or midline shift is noted. Ventricular size is within normal
limits. There is no evidence of mass lesion, hemorrhage or acute
infarction.

Vascular: No hyperdense vessel or unexpected calcification.

Skull: Normal. Negative for fracture or focal lesion.

Sinuses/Orbits: No acute finding.

Other: None.
IMPRESSION: Mild chronic ischemic white matter disease. No acute intracranial
abnormality seen.

## 2020-03-27 IMAGING — DX DG CHEST 1V
1 series · 1 of 1 positions shown · non-contrast
Comparison: Radiograph 10/14/2019

CLINICAL DATA: Shortness of breath.

EXAM:
CHEST  1 VIEW

[chest ap]
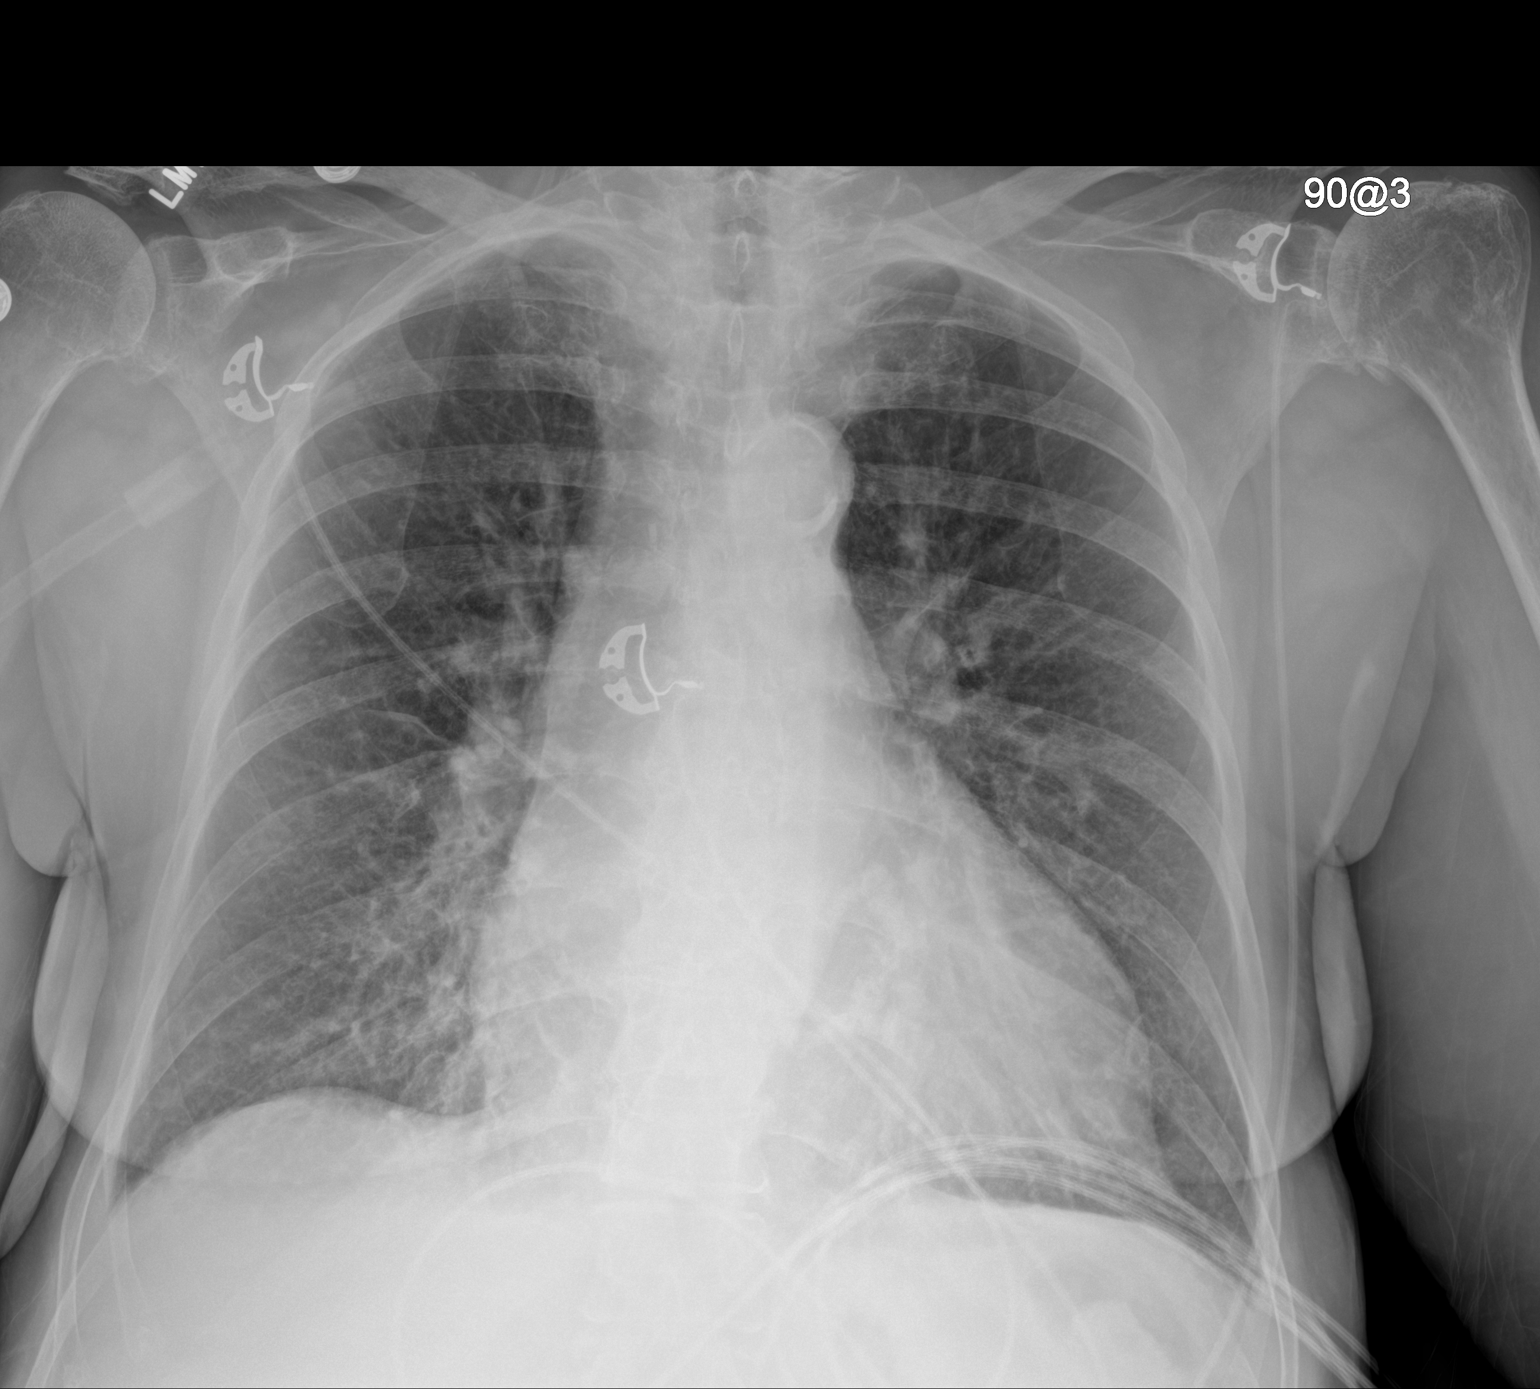

[1 of 1 positions shown; findings below may reference images not displayed]

FINDINGS: Cardiomegaly. Aortic atherosclerosis with unchanged mediastinal
contours. Progressive peribronchial thickening from prior exam. No
focal airspace disease, large pleural effusion or pneumothorax.
Chronic change about the left shoulder.
IMPRESSION: 1. Progressive peribronchial thickening from prior exam, pulmonary
edema versus bronchitic.
2. Stable cardiomegaly.

Aortic Atherosclerosis ([id number]-58X.X).
